# Patient Record
Sex: Male | Born: 1990
Health system: Southern US, Community
[De-identification: ages and names within clinical notes are randomized; demographics above are authoritative.]

## PROBLEM LIST (undated history)

## (undated) DIAGNOSIS — I10 Essential (primary) hypertension: Secondary | ICD-10-CM

## (undated) DIAGNOSIS — E1065 Type 1 diabetes mellitus with hyperglycemia: Secondary | ICD-10-CM

## (undated) DIAGNOSIS — E1010 Type 1 diabetes mellitus with ketoacidosis without coma: Secondary | ICD-10-CM

## (undated) DIAGNOSIS — J45909 Unspecified asthma, uncomplicated: Secondary | ICD-10-CM

## (undated) DIAGNOSIS — J189 Pneumonia, unspecified organism: Secondary | ICD-10-CM

## (undated) DIAGNOSIS — Z8489 Family history of other specified conditions: Secondary | ICD-10-CM

## (undated) DIAGNOSIS — J309 Allergic rhinitis, unspecified: Secondary | ICD-10-CM

## (undated) DIAGNOSIS — E1110 Type 2 diabetes mellitus with ketoacidosis without coma: Secondary | ICD-10-CM

## (undated) DIAGNOSIS — T8859XA Other complications of anesthesia, initial encounter: Secondary | ICD-10-CM

## (undated) DIAGNOSIS — D509 Iron deficiency anemia, unspecified: Secondary | ICD-10-CM

## (undated) DIAGNOSIS — Z9889 Other specified postprocedural states: Secondary | ICD-10-CM

## (undated) DIAGNOSIS — R519 Headache, unspecified: Secondary | ICD-10-CM

## (undated) DIAGNOSIS — N189 Chronic kidney disease, unspecified: Secondary | ICD-10-CM

## (undated) DIAGNOSIS — F988 Other specified behavioral and emotional disorders with onset usually occurring in childhood and adolescence: Secondary | ICD-10-CM

## (undated) DIAGNOSIS — D649 Anemia, unspecified: Secondary | ICD-10-CM

## (undated) DIAGNOSIS — E049 Nontoxic goiter, unspecified: Secondary | ICD-10-CM

## (undated) HISTORY — DX: Nontoxic goiter, unspecified: E04.9

## (undated) HISTORY — PX: TONSILLECTOMY: SUR1361

## (undated) HISTORY — DX: Allergic rhinitis, unspecified: J30.9

## (undated) HISTORY — DX: Other specified behavioral and emotional disorders with onset usually occurring in childhood and adolescence: F98.8

## (undated) HISTORY — DX: Type 1 diabetes mellitus with hyperglycemia: E10.65

---
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
## 2012-01-03 ENCOUNTER — Emergency Department (HOSPITAL_BASED_OUTPATIENT_CLINIC_OR_DEPARTMENT_OTHER)
Admission: EM | Admit: 2012-01-03 | Discharge: 2012-01-03 | Disposition: A | Discharge status: Home / Self Care | Payer: Self-pay | Attending: Emergency Medicine | Admitting: Emergency Medicine

## 2012-01-03 ENCOUNTER — Encounter (HOSPITAL_BASED_OUTPATIENT_CLINIC_OR_DEPARTMENT_OTHER): Payer: Self-pay | Admitting: *Deleted

## 2012-01-03 DIAGNOSIS — F17200 Nicotine dependence, unspecified, uncomplicated: Secondary | ICD-10-CM | POA: Insufficient documentation

## 2012-01-03 DIAGNOSIS — E109 Type 1 diabetes mellitus without complications: Secondary | ICD-10-CM | POA: Insufficient documentation

## 2012-01-03 DIAGNOSIS — R51 Headache: Secondary | ICD-10-CM | POA: Insufficient documentation

## 2012-01-03 DIAGNOSIS — I10 Essential (primary) hypertension: Secondary | ICD-10-CM | POA: Insufficient documentation

## 2012-01-03 DIAGNOSIS — J302 Other seasonal allergic rhinitis: Secondary | ICD-10-CM

## 2012-01-03 DIAGNOSIS — J309 Allergic rhinitis, unspecified: Secondary | ICD-10-CM | POA: Insufficient documentation

## 2012-01-03 DIAGNOSIS — R05 Cough: Secondary | ICD-10-CM

## 2012-01-03 DIAGNOSIS — F988 Other specified behavioral and emotional disorders with onset usually occurring in childhood and adolescence: Secondary | ICD-10-CM | POA: Insufficient documentation

## 2012-01-03 DIAGNOSIS — R059 Cough, unspecified: Secondary | ICD-10-CM | POA: Insufficient documentation

## 2012-01-03 MED ORDER — HYDROCOD POLST-CHLORPHEN POLST 10-8 MG/5ML PO LQCR: 5.0000 mL | Freq: Two times a day (BID) | ORAL | Status: DC | PRN

## 2012-01-03 MED ORDER — CETIRIZINE-PSEUDOEPHEDRINE ER 5-120 MG PO TB12: 1.0000 | ORAL_TABLET | Freq: Two times a day (BID) | ORAL | Status: DC

## 2012-01-03 NOTE — ED Provider Notes (Signed)
Medical screening examination/treatment/procedure(s) were performed by non-physician practitioner and as supervising physician I was immediately available for consultation/collaboration.   Karol Skarzynski, MD 01/03/12 1345 

## 2012-01-03 NOTE — ED Notes (Signed)
Productive cough almost 2 weeks. Clear sputum. Headache. Has been taking goodie powders for the head pain with no relief.

## 2012-01-03 NOTE — ED Provider Notes (Signed)
History     CSN: [id number]  Arrival date & time 01/03/12  1105   First MD Initiated Contact with Patient 01/03/12 1205      Chief Complaint  Patient presents with  . URI    (Consider location/radiation/quality/duration/timing/severity/associated sxs/prior treatment) Patient is a 21 y.o. male presenting with URI. The history is provided by the patient. No language interpreter was used.  URI The primary symptoms include headaches and cough. Primary symptoms do not include fever or nausea. The current episode started more than 1 week ago. This is a new problem. The problem has not changed since onset. Symptoms associated with the illness include congestion. The illness is not associated with sinus pressure.    Past Medical History  Diagnosis Date  . 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 Essential hypertension, benign 02/04/2011  . Goiter, unspecified 02/04/2011  . ADD (attention deficit disorder)     Past Surgical History  Procedure Date  . Tonsillectomy     Family History  Problem Relation Age of Onset  . Cancer Neg Hx     History  Substance Use Topics  . Smoking status: Current Everyday Smoker -- 1.0 packs/day for 1 years    Types: Cigarettes  . Smokeless tobacco: Not on file  . Alcohol Use: Yes      Review of Systems  Constitutional: Negative for fever.  HENT: Positive for congestion. Negative for sinus pressure.   Eyes: Negative.   Respiratory: Positive for cough.   Gastrointestinal: Negative for nausea.  Neurological: Positive for headaches.    Allergies  Review of patient's allergies indicates no known allergies.  Home Medications   Current Outpatient Rx  Name Route Sig Dispense Refill  . AMPHETAMINE-DEXTROAMPHET ER 20 MG PO CP24 Oral Take 20 mg by mouth every morning.      Marland Kitchen ACCU-CHEK AVIVA PLUS W/DEVICE KIT Does not apply 1 Device by Does not apply route once. 1 kit 0  . GLUCOSE BLOOD  VI STRP Other 1 each by Other route 4 (four) times daily. 4/day, and lancets 250.03  Variable glucoses 250.01 120 each 5  . INSULIN ASPART PROT & ASPART (70-30) 100 UNIT/ML [place] SUSP  45 units with breakfast, and 25 units with evening meal, and pen needles 2/day    . LISINOPRIL 5 MG PO TABS Oral Take 5 mg by mouth daily.        BP 114/72  Pulse 108  Temp(Src) 97.5 F (36.4 C) (Oral)  Resp 16  Ht 5\' 7"  (1.702 m)  Wt 169 lb (76.658 kg)  BMI 26.47 kg/m2  SpO2 99%  Physical Exam  Nursing note and vitals reviewed. Constitutional: He appears well-developed and well-nourished.  HENT:  Head: Normocephalic and atraumatic.  Right Ear: External ear normal.  Left Ear: External ear normal.  Nose: Rhinorrhea present.  Mouth/Throat: Posterior oropharyngeal erythema present.  Eyes: Conjunctivae and EOM are normal.  Neck: Normal range of motion. Neck supple.  Cardiovascular: Normal rate and regular rhythm.   Pulmonary/Chest: Effort normal and breath sounds normal.  Musculoskeletal: Normal range of motion.    ED Course  Procedures (including critical care time)  Labs Reviewed - No data to display No results found.   1. Cough   2. Seasonal allergic rhinitis       MDM  Don't think pt needs antibiotics at this time:will treat symptomatically        Teressa Lower, NP 01/03/12 1221

## 2012-01-03 NOTE — Discharge Instructions (Signed)
Allergic Rhinitis  Allergic rhinitis is when the mucous membranes in the nose respond to allergens. Allergens are particles in the air that cause your body to have an allergic reaction. This causes you to release allergic antibodies. Through a chain of events, these eventually cause you to release histamine into the blood stream (hence the use of antihistamines). Although meant to be protective to the body, it is this release that causes your discomfort, such as frequent sneezing, congestion and an itchy runny nose.    CAUSES    The pollen allergens may come from grasses, trees, and weeds. This is seasonal allergic rhinitis, or "hay fever." Other allergens cause year-round allergic rhinitis (perennial allergic rhinitis) such as house dust mite allergen, pet dander and mold spores.    SYMPTOMS     Nasal stuffiness (congestion).    Runny, itchy nose with sneezing and tearing of the eyes.    There is often an itching of the mouth, eyes and ears.   It cannot be cured, but it can be controlled with medications.  DIAGNOSIS    If you are unable to determine the offending allergen, skin or blood testing may find it.  TREATMENT     Avoid the allergen.    Medications and allergy shots (immunotherapy) can help.    Hay fever may often be treated with antihistamines in pill or nasal spray forms. Antihistamines block the effects of histamine. There are over-the-counter medicines that may help with nasal congestion and swelling around the eyes. Check with your caregiver before taking or giving this medicine.   If the treatment above does not work, there are many new medications your caregiver can prescribe. Stronger medications may be used if initial measures are ineffective. Desensitizing injections can be used if medications and avoidance fails. Desensitization is when a patient is given ongoing shots until the body becomes less sensitive to the allergen. Make sure you follow up with your caregiver if problems continue.   SEEK MEDICAL CARE IF:     You develop fever (more than 100.5 F (38.1 C).    You develop a cough that does not stop easily (persistent).    You have shortness of breath.    You start wheezing.    Symptoms interfere with normal daily activities.   Document Released: 05/03/2001 Document Revised: 07/28/2011 Document Reviewed: 11/12/2008  Exit[employer] Patient Information 2012 [employer]    Cough, Adult   A cough is a reflex that helps clear your throat and airways. It can help heal the body or may be a reaction to an irritated airway. A cough may only last 2 or 3 weeks (acute) or may last more than 8 weeks (chronic).    CAUSES  Acute cough:   Viral or bacterial infections.   Chronic cough:   Infections.    Allergies.    Asthma.    Post-nasal drip.    Smoking.    Heartburn or acid reflux.    Some medicines.    Chronic lung problems (COPD).    Cancer.   SYMPTOMS    Cough.    Fever.    Chest pain.    Increased breathing rate.    High-pitched whistling sound when breathing (wheezing).    Colored mucus that you cough up (sputum).   TREATMENT     A bacterial cough may be treated with antibiotic medicine.    A viral cough must run its course and will not respond to antibiotics.    Your caregiver may   recommend other treatments if you have a chronic cough.   HOME CARE INSTRUCTIONS     Only take over-the-counter or prescription medicines for pain, discomfort, or fever as directed by your caregiver. Use cough suppressants only as directed by your caregiver.    Use a cold steam vaporizer or humidifier in your bedroom or home to help loosen secretions.    Sleep in a semi-upright position if your cough is worse at night.    Rest as needed.    Stop smoking if you smoke.   SEEK IMMEDIATE MEDICAL CARE IF:     You have pus in your sputum.    Your cough starts to worsen.    You cannot control your cough with suppressants and are losing sleep.    You begin coughing up blood.     You have difficulty breathing.    You develop pain which is getting worse or is uncontrolled with medicine.    You have a fever.   MAKE SURE YOU:     Understand these instructions.    Will watch your condition.    Will get help right away if you are not doing well or get worse.   Document Released: 02/04/2011 Document Revised: 07/28/2011 Document Reviewed: 02/04/2011  [employer] Patient Information 2012 [employer]

## 2012-01-25 ENCOUNTER — Emergency Department (HOSPITAL_BASED_OUTPATIENT_CLINIC_OR_DEPARTMENT_OTHER)
Admission: EM | Admit: 2012-01-25 | Discharge: 2012-01-25 | Disposition: A | Discharge status: Home / Self Care | Payer: Self-pay | Attending: Emergency Medicine | Admitting: Emergency Medicine

## 2012-01-25 ENCOUNTER — Encounter (HOSPITAL_BASED_OUTPATIENT_CLINIC_OR_DEPARTMENT_OTHER): Payer: Self-pay | Admitting: Emergency Medicine

## 2012-01-25 DIAGNOSIS — E109 Type 1 diabetes mellitus without complications: Secondary | ICD-10-CM | POA: Insufficient documentation

## 2012-01-25 DIAGNOSIS — IMO0002 Reserved for concepts with insufficient information to code with codable children: Secondary | ICD-10-CM | POA: Insufficient documentation

## 2012-01-25 DIAGNOSIS — W57XXXA Bitten or stung by nonvenomous insect and other nonvenomous arthropods, initial encounter: Secondary | ICD-10-CM

## 2012-01-25 DIAGNOSIS — F17200 Nicotine dependence, unspecified, uncomplicated: Secondary | ICD-10-CM | POA: Insufficient documentation

## 2012-01-25 DIAGNOSIS — F988 Other specified behavioral and emotional disorders with onset usually occurring in childhood and adolescence: Secondary | ICD-10-CM | POA: Insufficient documentation

## 2012-01-25 MED ORDER — DOXYCYCLINE HYCLATE 100 MG PO CAPS: 100.0000 mg | ORAL_CAPSULE | Freq: Two times a day (BID) | ORAL | Status: AC

## 2012-01-25 NOTE — ED Provider Notes (Signed)
History    21 year old male presenting for evaluation tick bites. Takes removed from left foot and buttock area on Sunday. Presenting today he says mild redness around one of his foot when it checked out. No other complaints. No fevers or chills. No chest pain or shortness of breath. No dizziness or lightheadedness. Confusion. No numbness or tingling.  CSN: [id number]  Arrival date & time 01/25/12  2320   First MD Initiated Contact with Patient 01/25/12 2328      Chief Complaint  Patient presents with  . Insect Bite    (Consider location/radiation/quality/duration/timing/severity/associated sxs/prior treatment) HPI  Past Medical History  Diagnosis Date  . 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 Essential hypertension, benign 02/04/2011  . Goiter, unspecified 02/04/2011  . ADD (attention deficit disorder)     Past Surgical History  Procedure Date  . Tonsillectomy     Family History  Problem Relation Age of Onset  . Cancer Neg Hx     History  Substance Use Topics  . Smoking status: Current Everyday Smoker -- 1.0 packs/day for 1 years    Types: Cigarettes  . Smokeless tobacco: Not on file  . Alcohol Use: Yes      Review of Systems   Review of symptoms negative unless otherwise noted in HPI.    Allergies  Review of patient's allergies indicates no known allergies.  Home Medications   Current Outpatient Rx  Name Route Sig Dispense Refill  . AMPHETAMINE-DEXTROAMPHET ER 20 MG PO CP24 Oral Take 20 mg by mouth every morning.      Marland Kitchen ACCU-CHEK AVIVA PLUS W/DEVICE KIT Does not apply 1 Device by Does not apply route once. 1 kit 0  . CETIRIZINE-PSEUDOEPHEDRINE ER 5-120 MG PO TB12 Oral Take 1 tablet by mouth 2 (two) times daily. 20 tablet 0  . HYDROCOD POLST-CPM POLST ER 10-8 MG/5ML PO LQCR Oral Take 5 mLs by mouth every 12 (twelve) hours as needed. 140 mL 0  . DOXYCYCLINE HYCLATE 100 MG PO CAPS Oral Take 1  capsule (100 mg total) by mouth 2 (two) times daily. 28 capsule 0  . GLUCOSE BLOOD VI STRP Other 1 each by Other route 4 (four) times daily. 4/day, and lancets 250.03  Variable glucoses 250.01 120 each 5  . INSULIN ASPART PROT & ASPART (70-30) 100 UNIT/ML [place] SUSP  45 units with breakfast, and 25 units with evening meal, and pen needles 2/day    . LISINOPRIL 5 MG PO TABS Oral Take 5 mg by mouth daily.        BP 118/74  Pulse 98  Temp(Src) 97.8 F (36.6 C) (Oral)  Resp 18  SpO2 100%  Physical Exam  Nursing note and vitals reviewed. Constitutional: He appears well-developed and well-nourished. No distress.  HENT:  Head: Normocephalic and atraumatic.  Eyes: Conjunctivae are normal. Right eye exhibits no discharge. Left eye exhibits no discharge.  Neck: Neck supple.  Cardiovascular: Normal rate, regular rhythm and normal heart sounds.  Exam reveals no gallop and no friction rub.   No murmur heard. Pulmonary/Chest: Effort normal and breath sounds normal. No respiratory distress.  Abdominal: Soft. He exhibits no distension. There is no tenderness.  Musculoskeletal: He exhibits no edema and no tenderness.  Neurological: He is alert.  Skin: Skin is warm and dry.       Small lesion to dorsum of distal L foot near base of 2nd/3rd digits. Nospecific but consistent with provided history. Minimal surrounding erythema  but not in pattern of erythema migrans. Small lesions buttock with similar appearance. Non tender. No fluctuance. No drainage or induration.  Psychiatric: He has a normal mood and affect. His behavior is normal. Thought content normal.    ED Course  Procedures (including critical care time)  Labs Reviewed - No data to display No results found.   1. Tick bite       MDM  21 year old male with small lesions consistent with insect bites. There is faint erythema surrounding this but they do not appear to be secondarily infected. Patient with no other complaints. Will give course  of doxycycline empirically. Return precautions were discussed. Outpatient followup.        Raeford Razor, MD 01/25/12 [phone]

## 2012-01-25 NOTE — ED Notes (Signed)
Pt states he removed a tick from buttock and left foot. Pt wants wounds checked.

## 2012-01-25 NOTE — Discharge Instructions (Signed)
Wood Tick Bite Ticks are insects that attach themselves to the skin. Most tick bites are harmless, but sometimes ticks carry diseases that can make a person quite ill. The chance of getting ill depends on:  The kind of tick that bites you.   Time of year.   How long the tick is attached.   Geographic location.  Wood ticks are also called dog ticks. They are generally black. They can have white markings. They live in shrubs and grassy areas. They are larger than deer ticks. Wood ticks are about the size of a watermelon seed. They have a hard body. The most common places for ticks to attach themselves are the scalp, neck, armpits, waist, and groin. Wood ticks may stay attached for up to 2 weeks. TICKS MUST BE REMOVED AS SOON AS POSSIBLE TO HELP PREVENT DISEASES CAUSED BY TICK BITES.  TO REMOVE A TICK: 1. If available, put on latex gloves before trying to remove a tick.  2. Grasp the tick as close to the skin as possible, with curved forceps, fine tweezers or a special tick removal tool.  3. Pull gently with steady pressure until the tick lets go. Do not twist the tick or jerk it suddenly. This may break off the tick's head or mouth parts.  4. Do not crush the tick's body. This could force disease-carrying fluids from the tick into your body.  5. After the tick is removed, wash the bite area and your hands with soap and water or other disinfectant.  6. Apply a small amount of antiseptic cream or ointment to the bite site.  7. Wash and disinfect any instruments that were used.  8. Save the tick in a jar or plastic bag for later identification. Preserve the tick with a bit of alcohol or put it in the freezer.  9. Do not apply a hot match, petroleum jelly, or fingernail polish to the tick. This does not work and may increase the chances of disease from the tick bite.  YOU MAY NEED TO SEE YOUR CAREGIVER FOR A TETANUS SHOT NOW IF:  You have no idea when you had the last one.   You have never had a  tetanus shot before.  If you need a tetanus shot, and you decide not to get one, there is a rare chance of getting tetanus. Sickness from tetanus can be serious. If you get a tetanus shot, your arm may swell, get red and warm to the touch at the shot site. This is common and not a problem. PREVENTION  Wear protective clothing. Long sleeves and pants are best.   Wear white clothes to see ticks more easily   Tuck your pant legs into your socks.   If walking on trail, stay in the middle of the trail to avoid brushing against bushes.   Put insect repellent on all exposed skin and along boot tops, pant legs and sleeve cuffs   Check clothing, hair and skin repeatedly and before coming inside.   Brush off any ticks that are not attached.  SEEK MEDICAL CARE IF:   You cannot remove a tick or part of the tick that is left in the skin.   Unexplained fever.   Redness and swelling in the area of the tick bite.   Tender, swollen lymph glands.   Diarrhea.   Weight loss.   Cough.   Fatigue.   Muscle, joint or bone pain.   Belly pain.   Headache.   Rash.    SEEK IMMEDIATE MEDICAL CARE IF:   You develop an oral temperature above 102 F (38.9 C).   You are having trouble walking or moving your legs.   Numbness in the legs.   Shortness of breath.   Confusion.   Repeated vomiting.  Document Released: 08/05/2000 Document Revised: 07/28/2011 Document Reviewed: 07/14/2008 [employer] Patient Information 2012 [employer]. 

## 2012-02-07 ENCOUNTER — Encounter (HOSPITAL_BASED_OUTPATIENT_CLINIC_OR_DEPARTMENT_OTHER): Payer: Self-pay | Admitting: *Deleted

## 2012-02-07 ENCOUNTER — Emergency Department (HOSPITAL_BASED_OUTPATIENT_CLINIC_OR_DEPARTMENT_OTHER)
Admission: EM | Admit: 2012-02-07 | Discharge: 2012-02-07 | Disposition: A | Discharge status: Home / Self Care | Payer: Self-pay | Attending: Emergency Medicine | Admitting: Emergency Medicine

## 2012-02-07 ENCOUNTER — Telehealth: Payer: Self-pay | Admitting: Endocrinology

## 2012-02-07 DIAGNOSIS — F988 Other specified behavioral and emotional disorders with onset usually occurring in childhood and adolescence: Secondary | ICD-10-CM | POA: Insufficient documentation

## 2012-02-07 DIAGNOSIS — R21 Rash and other nonspecific skin eruption: Secondary | ICD-10-CM | POA: Insufficient documentation

## 2012-02-07 DIAGNOSIS — E109 Type 1 diabetes mellitus without complications: Secondary | ICD-10-CM | POA: Insufficient documentation

## 2012-02-07 DIAGNOSIS — L237 Allergic contact dermatitis due to plants, except food: Secondary | ICD-10-CM

## 2012-02-07 DIAGNOSIS — E049 Nontoxic goiter, unspecified: Secondary | ICD-10-CM | POA: Insufficient documentation

## 2012-02-07 DIAGNOSIS — F17200 Nicotine dependence, unspecified, uncomplicated: Secondary | ICD-10-CM | POA: Insufficient documentation

## 2012-02-07 DIAGNOSIS — Z794 Long term (current) use of insulin: Secondary | ICD-10-CM | POA: Insufficient documentation

## 2012-02-07 DIAGNOSIS — I10 Essential (primary) hypertension: Secondary | ICD-10-CM | POA: Insufficient documentation

## 2012-02-07 MED ORDER — TRIAMCINOLONE ACETONIDE 0.5 % EX OINT: TOPICAL_OINTMENT | Freq: Three times a day (TID) | CUTANEOUS | Status: DC

## 2012-02-07 NOTE — ED Provider Notes (Signed)
History     CSN: [id number]  Arrival date & time 02/07/12  1215   First MD Initiated Contact with Patient 02/07/12 1223      Chief Complaint  Patient presents with  . Rash    (Consider location/radiation/quality/duration/timing/severity/associated sxs/prior treatment) Patient is a 21 y.o. male presenting with rash. The history is provided by the patient. No language interpreter was used.  Rash  This is a new problem. The current episode started 12 to 24 hours ago. The problem has been gradually worsening. There has been no fever. The rash is present on the right hand. Associated symptoms include blisters and itching. He has tried nothing for the symptoms.  Pt complains of poison ivy on right hand.  Pt is diabetic  Past Medical History  Diagnosis Date  . 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 Essential hypertension, benign 02/04/2011  . Goiter, unspecified 02/04/2011  . ADD (attention deficit disorder)     Past Surgical History  Procedure Date  . Tonsillectomy     Family History  Problem Relation Age of Onset  . Cancer Neg Hx     History  Substance Use Topics  . Smoking status: Current Everyday Smoker -- 1.0 packs/day for 1 years    Types: Cigarettes  . Smokeless tobacco: Not on file  . Alcohol Use: Yes      Review of Systems  Skin: Positive for itching and rash.  All other systems reviewed and are negative.    Allergies  Review of patient's allergies indicates no known allergies.  Home Medications   Current Outpatient Rx  Name Route Sig Dispense Refill  . AMPHETAMINE-DEXTROAMPHET ER 20 MG PO CP24 Oral Take 20 mg by mouth every morning.      Marland Kitchen ACCU-CHEK AVIVA PLUS W/DEVICE KIT Does not apply 1 Device by Does not apply route once. 1 kit 0  . CETIRIZINE-PSEUDOEPHEDRINE ER 5-120 MG PO TB12 Oral Take 1 tablet by mouth 2 (two) times daily. 20 tablet 0  . HYDROCOD POLST-CPM POLST ER 10-8 MG/5ML PO LQCR  Oral Take 5 mLs by mouth every 12 (twelve) hours as needed. 140 mL 0  . GLUCOSE BLOOD VI STRP Other 1 each by Other route 4 (four) times daily. 4/day, and lancets 250.03  Variable glucoses 250.01 120 each 5  . INSULIN ASPART PROT & ASPART (70-30) 100 UNIT/ML [place] SUSP  45 units with breakfast, and 25 units with evening meal, and pen needles 2/day    . LISINOPRIL 5 MG PO TABS Oral Take 5 mg by mouth daily.        BP 107/77  Pulse 95  Temp 97.6 F (36.4 C) (Oral)  Resp 18  SpO2 100%  Physical Exam  Vitals reviewed. Constitutional: He is oriented to person, place, and time. He appears well-developed and well-nourished.  Musculoskeletal: He exhibits tenderness.       Rash right hand,  Multiple small pimples  Neurological: He is alert and oriented to person, place, and time. He has normal reflexes.  Skin: Skin is warm and dry. Rash noted.  Psychiatric: He has a normal mood and affect.    ED Course  Procedures (including critical care time)  Labs Reviewed - No data to display No results found.   No diagnosis found.    MDM  Triamcinalone cream        Lonia Skinner [place], [place] 02/07/12 1241

## 2012-02-07 NOTE — Discharge Instructions (Signed)
Poison Ivy Poison ivy is a inflammation of the skin (contact dermatitis) caused by touching the allergens on the leaves of the ivy plant following previous exposure to the plant. The rash usually appears 48 hours after exposure. The rash is usually bumps (papules) or blisters (vesicles) in a linear pattern. Depending on your own sensitivity, the rash may simply cause redness and itching, or it may also progress to blisters which may break open. These must be well cared for to prevent secondary bacterial (germ) infection, followed by scarring. Keep any open areas dry, clean, dressed, and covered with an antibacterial ointment if needed. The eyes may also get puffy. The puffiness is worst in the morning and gets better as the day progresses. This dermatitis usually heals without scarring, within 2 to 3 weeks without treatment. HOME CARE INSTRUCTIONS  Thoroughly wash with soap and water as soon as you have been exposed to poison ivy. You have about one half hour to remove the plant resin before it will cause the rash. This washing will destroy the oil or antigen on the skin that is causing, or will cause, the rash. Be sure to wash under your fingernails as any plant resin there will continue to spread the rash. Do not rub skin vigorously when washing affected area. Poison ivy cannot spread if no oil from the plant remains on your body. A rash that has progressed to weeping sores will not spread the rash unless you have not washed thoroughly. It is also important to wash any clothes you have been wearing as these may carry active allergens. The rash will return if you wear the unwashed clothing, even several days later. Avoidance of the plant in the future is the best measure. Poison ivy plant can be recognized by the number of leaves. Generally, poison ivy has three leaves with flowering branches on a single stem. Diphenhydramine may be purchased over the counter and used as needed for itching. Do not drive with  this medication if it makes you drowsy.Ask your caregiver about medication for children. SEEK MEDICAL CARE IF:  Open sores develop.   Redness spreads beyond area of rash.   You notice purulent (pus-like) discharge.   You have increased pain.   Other signs of infection develop (such as fever).  Document Released: 08/05/2000 Document Revised: 07/28/2011 Document Reviewed: 06/24/2009 [employer] Patient Information 2012 [employer]. 

## 2012-02-07 NOTE — Telephone Encounter (Signed)
Caller: Drury/Patient; PCP: Romero Belling; Call regarding Needs Copy of Sliding Scale for Novalog and Lantis- He Can Come by Office Today and Pick It Up. PLEASE CALL WHEN READY;CB#: [phone];

## 2012-02-07 NOTE — ED Notes (Signed)
Pt amb to room 8 with quick steady gait in nad. 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 Denies any pain or fevers.

## 2012-02-07 NOTE — ED Provider Notes (Signed)
Medical screening examination/treatment/procedure(s) were performed by non-physician practitioner and as supervising physician I was immediately available for consultation/collaboration.    Nelia Shi, MD 02/07/12 [phone]

## 2012-02-08 NOTE — Telephone Encounter (Signed)
please call patient: F/u ov is overdue.  Please make appt.  We'll address then

## 2012-02-08 NOTE — Telephone Encounter (Signed)
Pt has appointment for 02/13/2012 9:45am. AVS and office note from last OV printed and placed upfront in cabinet ready for pt to pickup. Pt informed.

## 2012-02-13 ENCOUNTER — Encounter: Payer: Self-pay | Admitting: Endocrinology

## 2012-02-13 ENCOUNTER — Ambulatory Visit (INDEPENDENT_AMBULATORY_CARE_PROVIDER_SITE_OTHER): Payer: Self-pay | Admitting: Endocrinology

## 2012-02-13 VITALS — BP 112/56 | HR 89 | Temp 98.0°F | Ht 70.0 in | Wt 163.0 lb

## 2012-02-13 DIAGNOSIS — E1065 Type 1 diabetes mellitus with hyperglycemia: Secondary | ICD-10-CM

## 2012-02-13 NOTE — Progress Notes (Signed)
  Subjective:    Patient ID: John [name], male    DOB: 06-20-1991, 21 y.o.   MRN: [id number]  HPI Pt returns for f/u of type 1 dm (dx'ed 2008; no known complications).  He says he has never had an episode of severe hypoglycemia.  He changed the novolog 70/30 back to lantus, due to a rash.  The rash is now resolved.  He is here with his girlfriend, who says pt eats excessively throughout the day.  no cbg record, but states cbg's we well-controlled when he was on the novolog 70/30.   Past Medical History  Diagnosis Date  . 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 Essential hypertension, benign 02/04/2011  . Goiter, unspecified 02/04/2011  . ADD (attention deficit disorder)     Past Surgical History  Procedure Date  . Tonsillectomy     History   Social History  . Marital Status: Single    Spouse Name: N/A    Number of Children: N/A  . Years of Education: N/A   Occupational History  . Not on file.   Social History Main Topics  . Smoking status: Current Everyday Smoker -- 1.0 packs/day for 1 years    Types: Cigarettes  . Smokeless tobacco: Not on file  . Alcohol Use: Yes  . Drug Use: Not on file  . Sexually Active: Not on file   Other Topics Concern  . Not on file   Social History Narrative   Regular exercise-yes    Current Outpatient Prescriptions on File Prior to Visit  Medication Sig Dispense Refill  . amphetamine-dextroamphetamine (ADDERALL XR) 20 MG 24 hr capsule Take 20 mg by mouth every morning.        . cetirizine-pseudoephedrine (ZYRTEC-D) 5-120 MG per tablet Take 1 tablet by mouth 2 (two) times daily.  20 tablet  0  . insulin lispro protamine-insulin lispro (HUMALOG 75/25) (75-25) 100 UNIT/ML SUSP 45 units with breakfast, and 25 units with evening meal.      . lisinopril (PRINIVIL,ZESTRIL) 5 MG tablet Take 5 mg by mouth daily.        Marland Kitchen triamcinolone ointment (KENALOG) 0.5 % Apply topically 3 (three)  times daily.  30 g  0    No Known Allergies  Family History  Problem Relation Age of Onset  . Cancer Neg Hx     BP 112/56  Pulse 89  Temp 98 F (36.7 C) (Oral)  Ht 5\' 10"  (1.778 m)  Wt 163 lb (73.936 kg)  BMI 23.39 kg/m2  SpO2 97%  Review of Systems denies hypoglycemia    Objective:   Physical Exam VITAL SIGNS:  See vs page GENERAL: no distress Pulses: dorsalis pedis intact bilat.   Feet: no deformity.  no ulcer on the feet.  feet are of normal color and temp.  no edema Neuro: sensation is intact to touch on the feet Skin: no rash      Assessment & Plan:  DM, poor control.  Rash, ? Due to insulin.

## 2012-02-13 NOTE — Patient Instructions (Addendum)
check your blood sugar 4 times a day.  vary the time of day when you check, between before the 3 meals, and at bedtime.  also check if you have symptoms of your blood sugar being too high or too low.  please keep a record of the readings and bring it to your next appointment here.  please call us sooner if you are having low blood sugar episodes, or if it stays over 200.   The next step is to write down your blood sugar, and any comments you would like to make in the book also.    Take humalog 75/25, 45 units with breakfast, and 25 units with evening meal. Please come back for a follow-up appointment for 3 months.

## 2012-02-14 ENCOUNTER — Telehealth: Payer: Self-pay | Admitting: Endocrinology

## 2012-02-14 NOTE — Telephone Encounter (Signed)
For now, go back to the lantus you were on.  Please call if you need a prescription

## 2012-02-14 NOTE — Telephone Encounter (Signed)
Informed pt's GF of MD's advisement regarding insulin  On VM and to callback office with any questions/concerns.

## 2012-02-14 NOTE — Telephone Encounter (Signed)
John [name] was seen yesterday.  His insulin was changed.  He is breaking out in a rash.  He took humalog 35 units last night.  He started breaking out in 15 min.  All over chest and abdomen.

## 2012-02-15 ENCOUNTER — Telehealth: Payer: Self-pay

## 2012-02-15 NOTE — Telephone Encounter (Signed)
Pt called requesting out of work note from Monday to today, returning tomorrow. Zella Ball @ [phone]

## 2012-02-15 NOTE — Telephone Encounter (Signed)
i printed 

## 2012-02-15 NOTE — Telephone Encounter (Signed)
Work note faxed to number listed below, pt informed.

## 2012-05-14 ENCOUNTER — Ambulatory Visit: Payer: Self-pay | Admitting: Endocrinology

## 2012-05-14 DIAGNOSIS — Z0289 Encounter for other administrative examinations: Secondary | ICD-10-CM

## 2012-05-15 ENCOUNTER — Encounter: Payer: Self-pay | Admitting: *Deleted

## 2012-05-15 ENCOUNTER — Ambulatory Visit (INDEPENDENT_AMBULATORY_CARE_PROVIDER_SITE_OTHER): Payer: Self-pay | Admitting: Endocrinology

## 2012-05-15 ENCOUNTER — Encounter: Payer: Self-pay | Admitting: Endocrinology

## 2012-05-15 ENCOUNTER — Other Ambulatory Visit (INDEPENDENT_AMBULATORY_CARE_PROVIDER_SITE_OTHER): Payer: Self-pay

## 2012-05-15 VITALS — BP 118/64 | HR 78 | Temp 98.4°F | Wt 166.0 lb

## 2012-05-15 DIAGNOSIS — E1065 Type 1 diabetes mellitus with hyperglycemia: Secondary | ICD-10-CM

## 2012-05-15 DIAGNOSIS — IMO0002 Reserved for concepts with insufficient information to code with codable children: Secondary | ICD-10-CM

## 2012-05-15 LAB — HEMOGLOBIN A1C: Hgb A1c MFr Bld: 10.8 % — ABNORMAL HIGH (ref 4.6–6.5)

## 2012-05-15 NOTE — Progress Notes (Signed)
  Subjective:    Patient ID: John [name], male    DOB: 22-Oct-1990, 21 y.o.   MRN: [id number]  HPI Pt returns for f/u of type 1 dm (dx'ed 2008; no known complications; he has struggled with compliance issues; he says he has never had an episode of severe hypoglycemia).   He says his medicaid is pending.  no cbg record, but states cbg's are well-controlled.  He has mild hypoglycemia if he misses a meal.   Past Medical History  Diagnosis Date  . 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 Essential hypertension, benign 02/04/2011  . Goiter, unspecified 02/04/2011  . ADD (attention deficit disorder)     Past Surgical History  Procedure Date  . Tonsillectomy     History   Social History  . Marital Status: Single    Spouse Name: N/A    Number of Children: N/A  . Years of Education: N/A   Occupational History  . Not on file.   Social History Main Topics  . Smoking status: Current Every Day Smoker -- 1.0 packs/day for 1 years    Types: Cigarettes  . Smokeless tobacco: Not on file  . Alcohol Use: Yes  . Drug Use: Not on file  . Sexually Active: Not on file   Other Topics Concern  . Not on file   Social History Narrative   Regular exercise-yes    Current Outpatient Prescriptions on File Prior to Visit  Medication Sig Dispense Refill  . amphetamine-dextroamphetamine (ADDERALL XR) 20 MG 24 hr capsule Take 20 mg by mouth every morning.        . cetirizine-pseudoephedrine (ZYRTEC-D) 5-120 MG per tablet Take 1 tablet by mouth 2 (two) times daily.  20 tablet  0  . insulin lispro protamine-insulin lispro (HUMALOG 75/25) (75-25) 100 UNIT/ML SUSP 45 units with breakfast, and 25 units with evening meal.      . triamcinolone ointment (KENALOG) 0.5 % Apply topically 3 (three) times daily.  30 g  0  . lisinopril (PRINIVIL,ZESTRIL) 5 MG tablet Take 5 mg by mouth daily.          No Known Allergies  Family History  Problem  Relation Age of Onset  . Cancer Neg Hx     BP 118/64  Pulse 78  Temp 98.4 F (36.9 C) (Oral)  Wt 166 lb (75.297 kg)  Review of Systems Denies LOC    Objective:   Physical Exam VITAL SIGNS:  See vs page GENERAL: no distress SKIN:  Insulin injection sites at the anterior abdomen are normal   Lab Results  Component Value Date   HGBA1C 10.8* 05/15/2012      Assessment & Plan:  DM, therapy limited by noncompliance.  i'll do the best i can.

## 2012-05-15 NOTE — Patient Instructions (Addendum)
check your blood sugar 4 times a day.  vary the time of day when you check, between before the 3 meals, and at bedtime.  also check if you have symptoms of your blood sugar being too high or too low.  please keep a record of the readings and bring it to your next appointment here.  please call us sooner if you are having low blood sugar episodes, or if it stays over 200.  Here is a book to write it in.   Please write comments you would like to make in the book, also.    continue humalog 75/25, 45 units with breakfast, and 25 units with evening meal.   Please come back for a follow-up appointment for 3-4 months.   On this type of insulin, it is important to eat meals on a regular schedule.  please call [phone] ([place] physician referral line), to get an appointment with a new primary doctor.

## 2012-05-17 ENCOUNTER — Telehealth: Payer: Self-pay

## 2012-05-17 NOTE — Telephone Encounter (Signed)
You are not disabled from the standpoint of the diabetes. If there is a another reason, please ask your pcp

## 2012-05-17 NOTE — Telephone Encounter (Signed)
Pt called requesting work excuse for yesterday and today, returning tomorrow. Please advise

## 2012-05-18 NOTE — Telephone Encounter (Signed)
Pt informed

## 2012-08-27 ENCOUNTER — Inpatient Hospital Stay (HOSPITAL_COMMUNITY)
Admission: EM | Admit: 2012-08-27 | Discharge: 2012-08-30 | DRG: 639 | Disposition: A | Discharge status: Home / Self Care | Payer: Medicaid Other | Attending: Internal Medicine | Admitting: Internal Medicine

## 2012-08-27 DIAGNOSIS — E1110 Type 2 diabetes mellitus with ketoacidosis without coma: Secondary | ICD-10-CM | POA: Diagnosis present

## 2012-08-27 DIAGNOSIS — IMO0002 Reserved for concepts with insufficient information to code with codable children: Secondary | ICD-10-CM | POA: Diagnosis present

## 2012-08-27 DIAGNOSIS — E049 Nontoxic goiter, unspecified: Secondary | ICD-10-CM

## 2012-08-27 DIAGNOSIS — E1010 Type 1 diabetes mellitus with ketoacidosis without coma: Principal | ICD-10-CM | POA: Diagnosis present

## 2012-08-27 DIAGNOSIS — I10 Essential (primary) hypertension: Secondary | ICD-10-CM

## 2012-08-27 DIAGNOSIS — E1065 Type 1 diabetes mellitus with hyperglycemia: Secondary | ICD-10-CM | POA: Diagnosis present

## 2012-08-27 DIAGNOSIS — Z7289 Other problems related to lifestyle: Secondary | ICD-10-CM | POA: Diagnosis present

## 2012-08-27 DIAGNOSIS — E875 Hyperkalemia: Secondary | ICD-10-CM | POA: Diagnosis present

## 2012-08-27 DIAGNOSIS — F1090 Alcohol use, unspecified, uncomplicated: Secondary | ICD-10-CM | POA: Diagnosis present

## 2012-08-27 DIAGNOSIS — E860 Dehydration: Secondary | ICD-10-CM | POA: Diagnosis present

## 2012-08-27 DIAGNOSIS — E109 Type 1 diabetes mellitus without complications: Secondary | ICD-10-CM | POA: Diagnosis present

## 2012-08-27 DIAGNOSIS — A088 Other specified intestinal infections: Secondary | ICD-10-CM | POA: Diagnosis present

## 2012-08-27 DIAGNOSIS — I498 Other specified cardiac arrhythmias: Secondary | ICD-10-CM | POA: Diagnosis present

## 2012-08-27 DIAGNOSIS — F17200 Nicotine dependence, unspecified, uncomplicated: Secondary | ICD-10-CM | POA: Diagnosis present

## 2012-08-27 DIAGNOSIS — F988 Other specified behavioral and emotional disorders with onset usually occurring in childhood and adolescence: Secondary | ICD-10-CM | POA: Diagnosis present

## 2012-08-27 DIAGNOSIS — Z794 Long term (current) use of insulin: Secondary | ICD-10-CM

## 2012-08-27 DIAGNOSIS — D72829 Elevated white blood cell count, unspecified: Secondary | ICD-10-CM | POA: Diagnosis present

## 2012-08-27 LAB — GLUCOSE, CAPILLARY: Glucose-Capillary: >600 mg/dL (ref 70–99)

## 2012-08-27 MED ORDER — SODIUM CHLORIDE 0.9 % IV SOLN
Freq: Once | INTRAVENOUS | Status: AC
  Administered 2012-08-27: via INTRAVENOUS

## 2012-08-27 MED ORDER — ONDANSETRON HCL 4 MG/2ML IJ SOLN
4.0000 mg | Freq: Once | INTRAMUSCULAR | Status: AC
  Administered 2012-08-27: 4 mg via INTRAVENOUS
  Filled 2012-08-27: qty 2

## 2012-08-27 MED ORDER — FENTANYL CITRATE 0.05 MG/ML IJ SOLN
50.0000 ug | Freq: Once | INTRAMUSCULAR | Status: AC
  Administered 2012-08-27: 50 ug via INTRAVENOUS
  Filled 2012-08-27: qty 2

## 2012-08-27 NOTE — ED Notes (Signed)
Brought in by EMS from home with c/o abdominal pain with nausea, vomiting.  Per EMS, pt is also diabetic--- pt's CBG was 400, taken by EMS.

## 2012-08-27 NOTE — ED Notes (Signed)
[id number]<AV> Expected date:<BR> Expected time:<BR> Means of arrival:<BR> Comments:<BR> EMS/21 yo diabetic-nausea and vomiting-hyperglycemia with &gt;400 blood sugar

## 2012-08-28 ENCOUNTER — Inpatient Hospital Stay (HOSPITAL_COMMUNITY): Payer: Medicaid Other

## 2012-08-28 ENCOUNTER — Encounter (HOSPITAL_COMMUNITY): Payer: Self-pay | Admitting: Emergency Medicine

## 2012-08-28 DIAGNOSIS — D72829 Elevated white blood cell count, unspecified: Secondary | ICD-10-CM | POA: Diagnosis present

## 2012-08-28 DIAGNOSIS — E860 Dehydration: Secondary | ICD-10-CM | POA: Diagnosis present

## 2012-08-28 DIAGNOSIS — E1110 Type 2 diabetes mellitus with ketoacidosis without coma: Secondary | ICD-10-CM | POA: Diagnosis present

## 2012-08-28 LAB — BASIC METABOLIC PANEL
BUN: 22 mg/dL (ref 6–23)
BUN: 20 mg/dL (ref 6–23)
BUN: 18 mg/dL (ref 6–23)
BUN: 17 mg/dL (ref 6–23)
BUN: 16 mg/dL (ref 6–23)
BUN: 18 mg/dL (ref 6–23)
BUN: 18 mg/dL (ref 6–23)
CO2: <7 mEq/L — CL (ref 19–32)
CO2: 7 mEq/L — CL (ref 19–32)
CO2: 18 mEq/L — ABNORMAL LOW (ref 19–32)
Calcium: 8 mg/dL — ABNORMAL LOW (ref 8.4–10.5)
Calcium: 8.2 mg/dL — ABNORMAL LOW (ref 8.4–10.5)
Calcium: 8.3 mg/dL — ABNORMAL LOW (ref 8.4–10.5)
Calcium: 8.3 mg/dL — ABNORMAL LOW (ref 8.4–10.5)
Calcium: 8.5 mg/dL (ref 8.4–10.5)
Calcium: 8.8 mg/dL (ref 8.4–10.5)
Calcium: 8.5 mg/dL (ref 8.4–10.5)
Chloride: 95 mEq/L — ABNORMAL LOW (ref 96–112)
Chloride: 102 mEq/L (ref 96–112)
Chloride: 106 mEq/L (ref 96–112)
Creatinine, Ser: 0.81 mg/dL (ref 0.50–1.35)
Creatinine, Ser: 0.82 mg/dL (ref 0.50–1.35)
Creatinine, Ser: 0.79 mg/dL (ref 0.50–1.35)
Creatinine, Ser: 0.69 mg/dL (ref 0.50–1.35)
Creatinine, Ser: 0.65 mg/dL (ref 0.50–1.35)
Creatinine, Ser: 0.6 mg/dL (ref 0.50–1.35)
Creatinine, Ser: 0.75 mg/dL (ref 0.50–1.35)
Creatinine, Ser: 0.7 mg/dL (ref 0.50–1.35)
GFR calc Af Amer: >90 mL/min (ref >90)
GFR calc Af Amer: >90 mL/min (ref >90)
GFR calc Af Amer: >90 mL/min (ref >90)
GFR calc Af Amer: >90 mL/min (ref >90)
GFR calc non Af Amer: >90 mL/min (ref >90)
GFR calc non Af Amer: >90 mL/min (ref >90)
GFR calc non Af Amer: >90 mL/min (ref >90)
GFR calc non Af Amer: >90 mL/min (ref >90)
Glucose, Bld: 531 mg/dL — ABNORMAL HIGH (ref 70–99)
Glucose, Bld: 457 mg/dL — ABNORMAL HIGH (ref 70–99)
Glucose, Bld: 342 mg/dL — ABNORMAL HIGH (ref 70–99)
Glucose, Bld: 214 mg/dL — ABNORMAL HIGH (ref 70–99)
Glucose, Bld: 141 mg/dL — ABNORMAL HIGH (ref 70–99)
Potassium: 5.1 mEq/L (ref 3.5–5.1)
Sodium: 130 mEq/L — ABNORMAL LOW (ref 135–145)
Sodium: 135 mEq/L (ref 135–145)

## 2012-08-28 LAB — CBC
HCT: 44.9 % (ref 39.0–52.0)
MCHC: 35.9 g/dL (ref 30.0–36.0)
MCV: 82.8 fL (ref 78.0–100.0)
MCV: 79.8 fL (ref 78.0–100.0)
Platelets: 448 10*3/uL — ABNORMAL HIGH (ref 150–400)
Platelets: 413 10*3/uL — ABNORMAL HIGH (ref 150–400)
RDW: 12.4 % (ref 11.5–15.5)
RDW: 12.5 % (ref 11.5–15.5)
WBC: 38.6 10*3/uL — ABNORMAL HIGH (ref 4.0–10.5)
WBC: 27.9 10*3/uL — ABNORMAL HIGH (ref 4.0–10.5)

## 2012-08-28 LAB — GLUCOSE, CAPILLARY
Glucose-Capillary: 498 mg/dL — ABNORMAL HIGH (ref 70–99)
Glucose-Capillary: 228 mg/dL — ABNORMAL HIGH (ref 70–99)
Glucose-Capillary: 223 mg/dL — ABNORMAL HIGH (ref 70–99)
Glucose-Capillary: 163 mg/dL — ABNORMAL HIGH (ref 70–99)
Glucose-Capillary: 151 mg/dL — ABNORMAL HIGH (ref 70–99)
Glucose-Capillary: 203 mg/dL — ABNORMAL HIGH (ref 70–99)
Glucose-Capillary: 259 mg/dL — ABNORMAL HIGH (ref 70–99)
Glucose-Capillary: 162 mg/dL — ABNORMAL HIGH (ref 70–99)

## 2012-08-28 LAB — CBC WITH DIFFERENTIAL/PLATELET
Basophils Absolute: 0.5 10*3/uL — ABNORMAL HIGH (ref 0.0–0.1)
Lymphocytes Relative: 11 % — ABNORMAL LOW (ref 12–46)
Lymphs Abs: 5.1 10*3/uL — ABNORMAL HIGH (ref 0.7–4.0)
Monocytes Relative: 6 % (ref 3–12)
Neutrophils Relative %: 82 % — ABNORMAL HIGH (ref 43–77)
Platelets: 699 10*3/uL — ABNORMAL HIGH (ref 150–400)
RDW: 12.5 % (ref 11.5–15.5)
WBC: 46.2 10*3/uL — ABNORMAL HIGH (ref 4.0–10.5)

## 2012-08-28 LAB — COMPREHENSIVE METABOLIC PANEL
AST: 15 U/L (ref 0–37)
Albumin: 4.8 g/dL (ref 3.5–5.2)
Chloride: 87 mEq/L — ABNORMAL LOW (ref 96–112)
Creatinine, Ser: 0.96 mg/dL (ref 0.50–1.35)
Sodium: 130 mEq/L — ABNORMAL LOW (ref 135–145)
Total Bilirubin: 0.4 mg/dL (ref 0.3–1.2)

## 2012-08-28 LAB — BLOOD GAS, VENOUS
O2 Content: 2 L/min
O2 Saturation: 74.5 %
Patient temperature: 98.6

## 2012-08-28 LAB — MRSA PCR SCREENING: MRSA by PCR: NEGATIVE

## 2012-08-28 LAB — URINALYSIS, ROUTINE W REFLEX MICROSCOPIC
Glucose, UA: >1000 mg/dL — AB
Ketones, ur: >80 mg/dL — AB
Leukocytes, UA: NEGATIVE
pH: 5 (ref 5.0–8.0)

## 2012-08-28 LAB — URINE MICROSCOPIC-ADD ON

## 2012-08-28 MED ORDER — INSULIN ASPART 100 UNIT/ML ~~LOC~~ SOLN
0.0000 [IU] | Freq: Three times a day (TID) | SUBCUTANEOUS | Status: DC
  Administered 2012-08-29 (×2): 3 [IU] via SUBCUTANEOUS
  Administered 2012-08-29 – 2012-08-30 (×2): 5 [IU] via SUBCUTANEOUS
  Administered 2012-08-30: 2 [IU] via SUBCUTANEOUS

## 2012-08-28 MED ORDER — INSULIN ASPART PROT & ASPART (70-30 MIX) 100 UNIT/ML ~~LOC~~ SUSP
45.0000 [IU] | Freq: Every day | SUBCUTANEOUS | Status: DC
  Administered 2012-08-29 – 2012-08-30 (×2): 45 [IU] via SUBCUTANEOUS

## 2012-08-28 MED ORDER — DEXTROSE-NACL 5-0.45 % IV SOLN: INTRAVENOUS | Status: DC

## 2012-08-28 MED ORDER — SODIUM CHLORIDE 0.9 % IV SOLN
INTRAVENOUS | Status: DC
  Administered 2012-08-28: 5.4 [IU]/h via INTRAVENOUS
  Filled 2012-08-28: qty 1

## 2012-08-28 MED ORDER — HEPARIN SODIUM (PORCINE) 5000 UNIT/ML IJ SOLN
5000.0000 [IU] | Freq: Three times a day (TID) | INTRAMUSCULAR | Status: DC
  Administered 2012-08-28 – 2012-08-30 (×7): 5000 [IU] via SUBCUTANEOUS
  Filled 2012-08-28 (×10): qty 1

## 2012-08-28 MED ORDER — SODIUM CHLORIDE 0.9 % IV SOLN
Freq: Once | INTRAVENOUS | Status: AC
  Administered 2012-08-28: 01:00:00 via INTRAVENOUS

## 2012-08-28 MED ORDER — DEXTROSE 50 % IV SOLN: 25.0000 mL | INTRAVENOUS | Status: DC | PRN

## 2012-08-28 MED ORDER — SODIUM CHLORIDE 0.9 % IV SOLN: INTRAVENOUS | Status: DC

## 2012-08-28 MED ORDER — SODIUM CHLORIDE 0.9 % IV SOLN
INTRAVENOUS | Status: DC
  Administered 2012-08-28: 11.4 [IU]/h via INTRAVENOUS
  Administered 2012-08-28: 14:00:00 via INTRAVENOUS
  Filled 2012-08-28 (×2): qty 1

## 2012-08-28 MED ORDER — FENTANYL CITRATE 0.05 MG/ML IJ SOLN: 50.0000 ug | Freq: Once | INTRAMUSCULAR | Status: DC

## 2012-08-28 MED ORDER — SODIUM CHLORIDE 0.9 % IV SOLN
INTRAVENOUS | Status: DC
  Filled 2012-08-28: qty 1

## 2012-08-28 MED ORDER — ONDANSETRON HCL 4 MG/2ML IJ SOLN: 4.0000 mg | Freq: Four times a day (QID) | INTRAMUSCULAR | Status: DC | PRN

## 2012-08-28 MED ORDER — INSULIN REGULAR BOLUS VIA INFUSION
5.0000 [IU] | Freq: Three times a day (TID) | INTRAVENOUS | Status: DC
  Administered 2012-08-29 (×3): 5 [IU] via INTRAVENOUS
  Filled 2012-08-28: qty 5

## 2012-08-28 MED ORDER — FENTANYL CITRATE 0.05 MG/ML IJ SOLN
50.0000 ug | Freq: Once | INTRAMUSCULAR | Status: AC
  Administered 2012-08-28: 50 ug via INTRAVENOUS
  Filled 2012-08-28: qty 2

## 2012-08-28 MED ORDER — POTASSIUM CHLORIDE CRYS ER 20 MEQ PO TBCR
40.0000 meq | EXTENDED_RELEASE_TABLET | Freq: Once | ORAL | Status: AC
  Administered 2012-08-28: 40 meq via ORAL
  Filled 2012-08-28: qty 2

## 2012-08-28 MED ORDER — INSULIN ASPART PROT & ASPART (70-30 MIX) 100 UNIT/ML ~~LOC~~ SUSP
25.0000 [IU] | Freq: Every day | SUBCUTANEOUS | Status: DC
  Administered 2012-08-28 – 2012-08-29 (×2): 25 [IU] via SUBCUTANEOUS
  Filled 2012-08-28 (×2): qty 10

## 2012-08-28 MED ORDER — HEPARIN SODIUM (PORCINE) 5000 UNIT/ML IJ SOLN: 5000.0000 [IU] | Freq: Three times a day (TID) | INTRAMUSCULAR | Status: DC

## 2012-08-28 MED ORDER — LACTATED RINGERS IV BOLUS (SEPSIS)
1000.0000 mL | Freq: Once | INTRAVENOUS | Status: DC
  Administered 2012-08-28: 1000 mL via INTRAVENOUS

## 2012-08-28 MED ORDER — DEXTROSE-NACL 5-0.45 % IV SOLN
INTRAVENOUS | Status: DC
  Administered 2012-08-28 (×2): via INTRAVENOUS

## 2012-08-28 MED ORDER — SODIUM CHLORIDE 0.9 % IV SOLN
INTRAVENOUS | Status: DC
  Administered 2012-08-28 – 2012-08-29 (×2): via INTRAVENOUS
  Administered 2012-08-29: 1000 mL via INTRAVENOUS
  Administered 2012-08-30: 11:00:00 via INTRAVENOUS

## 2012-08-28 MED ORDER — INSULIN ASPART 100 UNIT/ML ~~LOC~~ SOLN
0.0000 [IU] | Freq: Every day | SUBCUTANEOUS | Status: DC
  Administered 2012-08-29: 3 [IU] via SUBCUTANEOUS

## 2012-08-28 MED ORDER — SODIUM CHLORIDE 0.9 % IV BOLUS (SEPSIS)
500.0000 mL | Freq: Once | INTRAVENOUS | Status: AC
  Administered 2012-08-28: 500 mL via INTRAVENOUS

## 2012-08-28 MED ORDER — INSULIN ASPART PROT & ASPART (70-30 MIX) 100 UNIT/ML ~~LOC~~ SUSP: 45.0000 [IU] | Freq: Every day | SUBCUTANEOUS | Status: DC

## 2012-08-28 MED ORDER — SODIUM CHLORIDE 0.9 % IV SOLN: INTRAVENOUS | Status: AC

## 2012-08-28 MED ORDER — OXYCODONE-ACETAMINOPHEN 5-325 MG PO TABS
1.0000 | ORAL_TABLET | ORAL | Status: DC | PRN
  Administered 2012-08-28 – 2012-08-30 (×6): 1 via ORAL
  Filled 2012-08-28 (×6): qty 1

## 2012-08-28 MED ORDER — SODIUM CHLORIDE 0.9 % IV BOLUS (SEPSIS)
2000.0000 mL | Freq: Once | INTRAVENOUS | Status: AC
  Administered 2012-08-28: 2000 mL via INTRAVENOUS

## 2012-08-28 MED ORDER — INSULIN ASPART PROT & ASPART (70-30 MIX) 100 UNIT/ML ~~LOC~~ SUSP
25.0000 [IU] | Freq: Every day | SUBCUTANEOUS | Status: DC
  Filled 2012-08-28: qty 3

## 2012-08-28 NOTE — Progress Notes (Signed)
[id number]/Rhonda Davis, RN, BSN, CCM: CHART REVIEWED AND UPDATED.  Next chart review due on 01102014. NO DISCHARGE NEEDS PRESENT AT THIS TIME. CASE MANAGEMENT [phone] 

## 2012-08-28 NOTE — Clinical Social Work Psychosocial (Signed)
Clinical Social Work Department BRIEF PSYCHOSOCIAL ASSESSMENT 08/28/2012  Patient:  DVAUGHN, FICKLE     Account Number:  [id number]     Admit date:  08/27/2012  Clinical Social Worker:  Jodelle Red  Date/Time:  08/28/2012 10:15 AM  Referred by:  RN  Date Referred:  08/28/2012 Referred for  Other - See comment   Other Referral:   financial concerns, medicaid stopped   Interview type:  Family Other interview type:   discussion with mother, Pt not alert.    PSYCHOSOCIAL DATA Living Status:  FAMILY Admitted from facility:   Level of care:   Primary support name:  Lavonne Cass Primary support relationship to patient:  PARENT Degree of support available:   good from mother and girlfriend.    CURRENT CONCERNS Current Concerns  Financial Resources   Other Concerns:    SOCIAL WORK ASSESSMENT / PLAN CSW met with mother at RN request as she had concerns for her son's inability to afford meds. CSW provided mother with medicaid information and made referral to financial counselor. Pt lives at mother's home with his girlfriend and 72 month old son. Mother provides food and room for Pt. She appears very concerned and fearful of son's condition.   Assessment/plan status:  Referral to [facility] Other assessment/ plan:   csw made referral to financial counselor and RN CM for medication assistance.   Information/referral to community resources:    PATIENT'S/FAMILY'S RESPONSE TO PLAN OF CARE: Pt's mother very appreciative. CSW explained several programs Pt and his family could qualify for. CSW will reassess needs in the am.    Doreen Salvage, LCSW ICU/Stepdown Clinical Social Worker [facility] Cell [phone] Hours 8am-1200pm M-F

## 2012-08-28 NOTE — Progress Notes (Signed)
Nutrition Brief Note  Patient identified on the Malnutrition Screening Tool (MST) Report  Body mass index is 21.08 kg/(m^2). Patient meets criteria for wnl based on current BMI.   Current diet order is NPO except for sugar-free beverages. Labs and medications reviewed.   Pt with known hx of DM type 1 admitted for DKA after acute gastroenteritis.  Pt sleeping at time of visit.  Fiance at bedside declines needs to her knowledge and endorse good control as reported to her by patient.  RD will follow to ensure no needs with pt.  No nutrition interventions warranted at this time. If nutrition issues arise, please consult RD.   Loyce Dys, MS RD LDN Clinical Inpatient Dietitian Pager: [phone] Weekend/After hours pager: [phone]

## 2012-08-28 NOTE — ED Notes (Signed)
Admitting DR and X-ray at bedside.

## 2012-08-28 NOTE — ED Provider Notes (Signed)
History     CSN: [id number]  Arrival date & time 08/27/12  2332   First MD Initiated Contact with Patient 08/28/12 0029      Chief Complaint  Patient presents with  . Abdominal Pain, Nausea and Vomiting     (Consider location/radiation/quality/duration/timing/severity/associated sxs/prior treatment) HPI This is a 22 year old male with type 1 diabetes. His doctor adjusted his insulin regimen several months ago and he has been poorly controlled since. He is here with nausea, vomiting and abdominal pain that began this morning. It has persisted throughout the day. He did not want to come to the hospital but his mother forced him. On arrival he was noted to be tachycardic, tachypneic with CBG greater than 600. A 2 L normal saline bolus was initiated by protocol and he was given fentanyl and Zofran as well with improvement in his nausea and abdominal pain. He has had polydipsia and polyuria all day. His oral mucous membranes are dry. He is lethargic. The symptoms are severe.  Past Medical History  Diagnosis Date  . 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 Essential hypertension, benign 02/04/2011  . Goiter, unspecified 02/04/2011  . ADD (attention deficit disorder)     Past Surgical History  Procedure Date  . Tonsillectomy     Family History  Problem Relation Age of Onset  . Cancer Neg Hx     History  Substance Use Topics  . Smoking status: Current Every Day Smoker -- 1.0 packs/day for 1 years    Types: Cigarettes  . Smokeless tobacco: Not on file  . Alcohol Use: Yes      Review of Systems  All other systems reviewed and are negative.    Allergies  Review of patient's allergies indicates no known allergies.  Home Medications   Current Outpatient Rx  Name  Route  Sig  Dispense  Refill  . INSULIN ASPART 100 UNIT/ML [place] SOLN   Subcutaneous   Inject 6-10 Units into the skin 3 (three) times daily before meals.        . INSULIN GLARGINE 100 UNIT/ML [place] SOLN   Subcutaneous   Inject 10-19 Units into the skin at bedtime.         . INSULIN LISPRO PROT & LISPRO (75-25) 100 UNIT/ML [place] SUSP   Subcutaneous   Inject 25-45 Units into the skin 2 (two) times daily with a meal. 45 units in am and 25 units in pm         . INSULIN LISPRO PROT & LISPRO (75-25) 100 UNIT/ML [place] SUSP      45 units with breakfast, and 25 units with evening meal.           BP 149/68  Pulse 139  Temp 97.3 F (36.3 C)  Resp 16  Ht 6\' 1"  (1.854 m)  Wt 170 lb (77.111 kg)  BMI 22.43 kg/m2  SpO2 100%  Physical Exam General: Well-developed, thin male in no acute distress; appearance consistent with age of record; sunken eye HENT: normocephalic, atraumatic; dry mucous membranes; breath smells of ketones Eyes: pupils equal round and reactive to light; extraocular muscles intact Neck: supple Heart: regular rate and rhythm; tachycardic Lungs: clear to auscultation bilaterally; tachypnea Chest: Bilateral lower rib tenderness Abdomen: soft; nondistended; upper abdominal wall tenderness; no masses or hepatosplenomegaly; bowel sounds present Extremities: No deformity; full range of motion; pulses normal Neurologic: Awake but lethargic; motor function intact in all extremities and symmetric; no facial droop Skin:  Warm and dry Psychiatric: Flat affect   ED Course  Procedures (including critical care time)  CRITICAL CARE Performed by: Lynette Topete L   Total critical care time: 30 minutes  Critical care time was exclusive of separately billable procedures and treating other patients.  Critical care was necessary to treat or prevent imminent or life-threatening deterioration.  Critical care was time spent personally by me on the following activities: development of treatment plan with patient and/or surrogate as well as nursing, discussions with consultants, evaluation of patient's response to treatment, examination of  patient, obtaining history from patient or surrogate, ordering and performing treatments and interventions, ordering and review of laboratory studies, ordering and review of radiographic studies, pulse oximetry and re-evaluation of patient's condition.   MDM   Nursing notes and vitals signs, including pulse oximetry, reviewed.  Summary of this visit's results, reviewed by myself:  Labs:  Results for orders placed during the hospital encounter of 08/27/12 (from the past 24 hour(s))  GLUCOSE, CAPILLARY     Status: Abnormal   Collection Time   08/27/12 11:49 PM      Component Value Range   Glucose-Capillary >600 (*) 70 - 99 mg/dL   Comment 1 Notify RN    CBC WITH DIFFERENTIAL     Status: Abnormal   Collection Time   08/27/12 11:55 PM      Component Value Range   WBC 46.2 (*) 4.0 - 10.5 K/uL   RBC 5.73  4.22 - 5.81 MIL/uL   Hemoglobin 16.7  13.0 - 17.0 g/dL   HCT 09.8  11.9 - 14.7 %   MCV 84.3  78.0 - 100.0 fL   MCH 29.1  26.0 - 34.0 pg   MCHC 34.6  30.0 - 36.0 g/dL   RDW 82.9  56.2 - 13.0 %   Platelets 699 (*) 150 - 400 K/uL   Neutrophils Relative 82 (*) 43 - 77 %   Lymphocytes Relative 11 (*) 12 - 46 %   Monocytes Relative 6  3 - 12 %   Eosinophils Relative 0  0 - 5 %   Basophils Relative 1  0 - 1 %   Neutro Abs 37.8 (*) 1.7 - 7.7 K/uL   Lymphs Abs 5.1 (*) 0.7 - 4.0 K/uL   Monocytes Absolute 2.8 (*) 0.1 - 1.0 K/uL   Eosinophils Absolute 0.0  0.0 - 0.7 K/uL   Basophils Absolute 0.5 (*) 0.0 - 0.1 K/uL   WBC Morphology TOXIC GRANULATION    COMPREHENSIVE METABOLIC PANEL     Status: Abnormal   Collection Time   08/27/12 11:55 PM      Component Value Range   Sodium 130 (*) 135 - 145 mEq/L   Potassium 5.3 (*) 3.5 - 5.1 mEq/L   Chloride 87 (*) 96 - 112 mEq/L   CO2 <7 (*) 19 - 32 mEq/L   Glucose, Bld 729 (*) 70 - 99 mg/dL   BUN 24 (*) 6 - 23 mg/dL   Creatinine, Ser 8.65  0.50 - 1.35 mg/dL   Calcium 8.8  8.4 - 78.4 mg/dL   Total Protein 7.4  6.0 - 8.3 g/dL   Albumin 4.8  3.5 - 5.2  g/dL   AST 15  0 - 37 U/L   ALT 13  0 - 53 U/L   Alkaline Phosphatase 108  39 - 117 U/L   Total Bilirubin 0.4  0.3 - 1.2 mg/dL   GFR calc non Af Amer >90  >90 mL/min   GFR  calc Af Amer >90  >90 mL/min  LIPASE, BLOOD     Status: Normal   Collection Time   08/27/12 11:55 PM      Component Value Range   Lipase 17  11 - 59 U/L  BLOOD GAS, VENOUS     Status: Abnormal   Collection Time   08/28/12 12:30 AM      Component Value Range   O2 Content 2.0     Delivery systems NASAL CANNULA     pH, Ven 6.942 (*) 7.250 - 7.300   pCO2, Ven 22.1 (*) 45.0 - 50.0 mmHg   pO2, Ven 45.9 (*) 30.0 - 45.0 mmHg   Bicarbonate 4.5 (*) 20.0 - 24.0 mEq/L   TCO2 4.6  0 - 100 mmol/L   Acid-base deficit 29.3 (*) 0.0 - 2.0 mmol/L   O2 Saturation 74.5     Patient temperature 98.6     Collection site VEIN     Drawn by COLLECTED BY NURSE     Sample type VEIN      EKG Interpretation:  Date & Time: 08/28/2012 12:24 AM  Rate: 142  Rhythm: sinus tachycardia  QRS Axis: normal  Intervals: QT prolonged  ST/T Wave abnormalities: normal  Conduction Disutrbances:none  Narrative Interpretation:   Old EKG Reviewed: none available  12:49 AM Insulin drip per Glucose Stabilizer ordered. Exam and laboratories consistent with diabetic ketoacidosis.   1:09 AM PCCM consulted.         Hanley Seamen, MD 08/28/12 [id number]

## 2012-08-28 NOTE — Progress Notes (Signed)
[facility] PROGRESS NOTE  JAYSE HODKINSON NWG:[id number] DOB: 1991-01-29 DOA: 08/27/2012 PCP: Harlow Asa, MD  Brief narrative: John [name] is an 22 y.o. male with multiple medical problems including type 1 diabetes with prior DKA, last admission for DKA many years ago, presents to the emergency room with abdominal pain, nausea and vomiting and found to be in DKA with pH of 6.9 and bicarbonate of 4. Trigger may have been acute viral gastroenteritis as he had nausea and vomiting prior to admission with several family members sick with similar symptoms.  Assessment/Plan: Principal Problem:  *DKA (diabetic ketoacidoses) / type 1 diabetes  Admitted to the ICU and placed on a glucose stabilizer and an insulin drip. Bicarbonate increased to 12 this morning.  Trigger appears to be viral gastroenteritis given normal chest x-ray and urinalysis negative for signs of infection. No flulike symptoms.  Check hemoglobin A1c. Consult diabetes coordinator. Active Problems:  Dehydration  Secondary to osmotic diuresis. Aggressively hydrating.  Leukocytosis  Thought to be from severe volume depletion and dehydration. No obvious signs of infection. White blood cell count dropping with rehydration.  Alcohol intake above recommended sensible limits  Smoker  Counseled on cessation.  Code Status: Full code. Family Communication: Mother updated at bedside. Disposition Plan: Home when stable.   Medical Consultants:  None  Other Consultants:  Diabetes coordinator  Anti-infectives:  None  HPI/Subjective: Mr. Ellerby is lethargic and sleeping. His family says he slept through the night.  Objective: Filed Vitals:   08/28/12 0500 08/28/12 0600 08/28/12 0700 08/28/12 0800  BP: 122/69 126/71 121/61 126/63  Pulse: 127 128 120 117  Temp:      Resp: 18 20 16 17   Height:      Weight:      SpO2: 100% 100% 100% 100%    Intake/Output Summary (Last 24 hours) at 08/28/12 0846 Last data filed  at 08/28/12 0800  Gross per 24 hour  Intake 605.96 ml  Output   2900 ml  Net -2294.04 ml    Exam: Gen:  NAD, lethargic Cardiovascular:  Tachycardic Respiratory:  Lungs CTAB Gastrointestinal:  Abdomen soft, NT/ND, + BS Extremities:  No C/E/C  Data Reviewed: Basic Metabolic Panel:  Lab 08/28/12 5784 08/28/12 0515 08/28/12 0315 08/28/12 0206 08/27/12 2355  NA 132* 132* 130* 129* 130*  K PENDING 5.1 -- -- --  CL 102 101 95* 95* 87*  CO2 12* 7* 7* <7* <7*  GLUCOSE 231* 342* 457* 531* 729*  BUN 17 18 20 22  24*  CREATININE 0.69 0.79 0.82 0.81 0.96  CALCIUM 8.3* 8.3* 8.2* 8.0* 8.8  MG -- -- -- -- --  PHOS -- -- -- -- --   GFR Estimated Creatinine Clearance: 148.1 ml/min (by C-G formula based on Cr of 0.69). Liver Function Tests:  Lab 08/27/12 2355  AST 15  ALT 13  ALKPHOS 108  BILITOT 0.4  PROT 7.4  ALBUMIN 4.8    Lab 08/27/12 2355  LIPASE 17  AMYLASE --   CBC:  Lab 08/28/12 0206 08/27/12 2355  WBC 38.6* 46.2*  NEUTROABS -- 37.8*  HGB 16.1 16.7  HCT 44.9 48.3  MCV 82.8 84.3  PLT 448* 699*   CBG:  Lab 08/28/12 0208 08/27/12 2349  GLUCAP 498* >600*   Microbiology Recent Results (from the past 240 hour(s))  MRSA PCR SCREENING     Status: Normal   Collection Time   08/28/12  2:24 AM      Component Value Range Status Comment  MRSA by PCR NEGATIVE  NEGATIVE Final      Procedures and Diagnostic Studies: Dg Chest Port 1 View  08/28/2012  *RADIOLOGY REPORT*  Clinical Data: Nausea and vomiting.  Flu symptoms.  PORTABLE CHEST - 1 VIEW  Comparison: PA and lateral chest 10/16/2010.  Findings: Lungs clear.  Heart size normal.  No pneumothorax or pleural fluid.  No focal bony abnormality.  IMPRESSION: Negative chest.   Original Report Authenticated By: Holley Dexter, M.D.     Scheduled Meds:   . heparin  5,000 Units Subcutaneous Q8H   Continuous Infusions:   . sodium chloride 150 mL/hr at 08/28/12 0343  . dextrose 5 % and 0.45% NaCl 75 mL/hr at 08/28/12  0755  . insulin (NOVOLIN-R) infusion 6.7 Units/hr (08/28/12 0733)    Time spent: 35 minutes.   LOS: 1 day   Julee Stoll  [facility] Pager [phone].  If 8PM-8AM, please contact night-coverage at [email or web address], password [facility] 08/28/2012, 8:46 AM

## 2012-08-28 NOTE — Progress Notes (Signed)
Inpatient Diabetes Program Recommendations  AACE/ADA: New Consensus Statement on Inpatient Glycemic Control (2013)  Target Ranges:  Prepandial:   less than 140 mg/dL      Peak postprandial:   less than 180 mg/dL (1-2 hours)      Critically ill patients:  140 - 180 mg/dL   Reason for Visit: DKA  John [name] is an 22 y.o. male with multiple medical problems including type 1 diabetes with prior DKA, last admission for DKA many years ago, presents to the emergency room with abdominal pain, nausea and vomiting and found to be in DKA with pH of 6.9 and bicarbonate of 4. Trigger may have been acute viral gastroenteritis as he had nausea and vomiting prior to admission with several family members sick with similar symptoms. Sees Dr. Everardo All for diabetes.  Ran out of insulin samples that he got from Dr. Everardo All. States he could not afford 75/25 as it was over $300.00/mo.    Results for John [name], John [name] (MRN [id number]) as of 08/28/2012 16:16  Ref. Range 08/28/2012 13:28  Sodium Latest Range: 135-145 mEq/L 134 (L)  Potassium Latest Range: 3.5-5.1 mEq/L 3.5  Chloride Latest Range: 96-112 mEq/L 106  CO2 Latest Range: 19-32 mEq/L 18 (L)  BUN Latest Range: 6-23 mg/dL 16  Creatinine Latest Range: 0.50-1.35 mg/dL 4.09  Calcium Latest Range: 8.4-10.5 mg/dL 8.5  GFR calc non Af Amer Latest Range: >90 mL/min >90  GFR calc Af Amer Latest Range: >90 mL/min >90  Glucose Latest Range: 70-99 mg/dL 811 (H)  Results for John [name], John [name] (MRN [id number]) as of 08/28/2012 16:16  Ref. Range 08/28/2012 05:15  Hemoglobin A1C Latest Range: <5.7 % 12.4 (H)   Anion gap is closed.   When pt ready for transition to SQ, recommend:  70/30 45 units in am and 25 units in pm.   Novolog sensitive tidwc and hs (recommend prescription for Regular insulin for home)  Give 70/30 insulin 1 - 2 hours prior to discontinuation of GlucoStabilizer and simultaneously give Novolog correction.  Will continue to follow.  Thank you. Ailene Ards, RD, LDN, CDE Inpatient Diabetes Coordinator [phone]

## 2012-08-28 NOTE — H&P (Signed)
[facility] History and Physical  John [name]:[id number] DOB: 05-06-1991    PCP:   Harlow Asa, MD   Chief Complaint: abdominal cramps, nausea and vomiting.  HPI: John [name] is an 22 y.o. male  with multiple medical problems including type 1 diabetes with prior DKA,  last admission for DKA many years ago, presents to the emergency room with abdominal pain, nausea and vomiting and found to be in DKA with pH of 6.9 and bicarbonate of 4. Further evaluation included negative UA for any evidence of infection, clear CXR , blood glucose greater than 700, creatinine of 0.96   and potassium of  5.5 . Patient is lethargic but easily arousable and oriented and able to give a good history. There has been no fever, chills, cough, dysuria. Hospitalist was asked to admit her for DKA.  He denied any missed insulin, food indiscretion, and denied any flu like symptoms.  He did have severe polyuria, polydipsia, and symptoms only started 2 days ago.   Rewiew of Systems:  Constitutional: Negative for malaise, fever and chills. No significant weight loss or weight gain Eyes: Negative for eye pain, redness and discharge, diplopia, visual changes, or flashes of light. ENMT: Negative for ear pain, hoarseness, nasal congestion, sinus pressure and sore throat. No headaches; tinnitus, drooling, or problem swallowing. Cardiovascular: Negative for chest pain, palpitations, diaphoresis, dyspnea and peripheral edema. ; No orthopnea, PND Respiratory: Negative for cough, hemoptysis, wheezing and stridor. No pleuritic chestpain. Gastrointestinal: Negative for diarrhea, constipation,  melena, blood in stool, hematemesis, jaundice and rectal bleeding.    Genitourinary: Negative for frequency, dysuria, incontinence,flank pain and hematuria; Musculoskeletal: Negative for back pain and neck pain. Negative for swelling and trauma.;  Skin: . Negative for pruritus, rash, abrasions, bruising and skin lesion.;  ulcerations Neuro: Negative for headache, lightheadedness and neck stiffness. Negative for weakness, altered level of consciousness , altered mental status, extremity weakness, burning feet, involuntary movement, seizure and syncope.  Psych: negative for anxiety, depression, insomnia, tearfulness, panic attacks, hallucinations, paranoia, suicidal or homicidal ideation    Past Medical History  Diagnosis Date  . 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 Essential hypertension, benign 02/04/2011  . Goiter, unspecified 02/04/2011  . ADD (attention deficit disorder)     Past Surgical History  Procedure Date  . Tonsillectomy     Medications:  HOME MEDS: Prior to Admission medications   Medication Sig Start Date End Date Taking? Authorizing Provider  insulin aspart (NOVOLOG) 100 UNIT/ML injection Inject 6-10 Units into the skin 3 (three) times daily before meals.   Yes Historical Provider, MD  insulin glargine (LANTUS) 100 UNIT/ML injection Inject 10-19 Units into the skin at bedtime.   Yes Historical Provider, MD  insulin lispro protamine-insulin lispro (HUMALOG 75/25) (75-25) 100 UNIT/ML SUSP Inject 25-45 Units into the skin 2 (two) times daily with a meal. 45 units in am and 25 units in pm   Yes Historical Provider, MD  insulin lispro protamine-insulin lispro (HUMALOG 75/25) (75-25) 100 UNIT/ML SUSP 45 units with breakfast, and 25 units with evening meal.    Historical Provider, MD     Allergies:  No Known Allergies  Social History:   reports that he has been smoking Cigarettes.  He has a 1 pack-year smoking history. He does not have any smokeless tobacco history on file. He reports that he drinks alcohol. His drug history not on file.  Family History: Family History  Problem Relation Age  of Onset  . Cancer Neg Hx      Physical Exam: Filed Vitals:   08/27/12 2345 08/28/12 0035  BP: 149/68   Pulse: 139   Temp: 97.3 F  (36.3 C)   Resp: 16   Height:  6\' 1"  (1.854 m)  Weight:  77.111 kg (170 lb)  SpO2: 100%    Blood pressure 149/68, pulse 139, temperature 97.3 F (36.3 C), resp. rate 16, height 6\' 1"  (1.854 m), weight 77.111 kg (170 lb), SpO2 100.00%.  GEN:  Pleasant patient lying in the stretcher in no acute distress; cooperative with exam. PSYCH:  alert and oriented x4; does not appear anxious or depressed; affect is appropriate. HEENT: Mucous membranes pink and anicteric; PERRLA; EOM intact; no cervical lymphadenopathy nor thyromegaly or carotid bruit; no JVD; There were no stridor. Neck is very supple. Breasts:: Not examined CHEST WALL: No tenderness CHEST: Normal respiration, clear to auscultation bilaterally.  HEART: regular rhythm but tachycardic,.  There are no murmur, rub, or gallops.   BACK: No kyphosis or scoliosis; no CVA tenderness ABDOMEN: soft and non-tender; no masses, no organomegaly, normal abdominal bowel sounds; no pannus; no intertriginous candida. There is no rebound and no distention. Rectal Exam: Not done EXTREMITIES: No bone or joint deformity; age-appropriate arthropathy of the hands and knees; no edema; no ulcerations.  There is no calf tenderness. Genitalia: not examined PULSES: 2+ and symmetric SKIN: Normal hydration no rash or ulceration CNS: Cranial nerves 2-12 grossly intact no focal lateralizing neurologic deficit.  Speech is fluent; uvula elevated with phonation, facial symmetry and tongue midline. DTR are normal bilaterally, cerebella exam is intact, barbinski is negative and strengths are equaled bilaterally.  No sensory loss.   Labs on Admission:  Basic Metabolic Panel:  Lab 08/27/12 1610  NA 130*  K 5.3*  CL 87*  CO2 <7*  GLUCOSE 729*  BUN 24*  CREATININE 0.96  CALCIUM 8.8  MG --  PHOS --   Liver Function Tests:  Lab 08/27/12 2355  AST 15  ALT 13  ALKPHOS 108  BILITOT 0.4  PROT 7.4  ALBUMIN 4.8    Lab 08/27/12 2355  LIPASE 17  AMYLASE --    No results found for this basename: AMMONIA:5 in the last 168 hours CBC:  Lab 08/28/12 0206 08/27/12 2355  WBC 38.6* 46.2*  NEUTROABS -- 37.8*  HGB 16.1 16.7  HCT 44.9 48.3  MCV 82.8 84.3  PLT 448* 699*   Cardiac Enzymes: No results found for this basename: CKTOTAL:5,CKMB:5,CKMBINDEX:5,TROPONINI:5 in the last 168 hours  CBG:  Lab 08/28/12 0208 08/27/12 2349  GLUCAP 498* >600*     Radiological Exams on Admission: Dg Chest Port 1 View  08/28/2012  *RADIOLOGY REPORT*  Clinical Data: Nausea and vomiting.  Flu symptoms.  PORTABLE CHEST - 1 VIEW  Comparison: PA and lateral chest 10/16/2010.  Findings: Lungs clear.  Heart size normal.  No pneumothorax or pleural fluid.  No focal bony abnormality.  IMPRESSION: Negative chest.   Original Report Authenticated By: Holley Dexter, M.D.      Assessment/Plan Present on Admission:  . DKA (diabetic ketoacidoses) . Alcohol intake above recommended sensible limits . Smoker   PLAN:  Will admit patient to the ICU for DKA.  PCCM was consulted but deferred admission to the hospitalist team.  Will give IVF along with implementing the glucose stabalizer.   Will tx with analgesics and antiemetics. There is no evidence of infection and I suspect the leukocytosis is from severe volume depletion.  Home medications will be continued except the home insulin since patient is on insulin drip.  Patient is stable, full code, and will be admitted to [facility] service.  I encourage him to quit cigarettes.  Other plans as per orders.  Code Status: FULL Unk Lightning, MD. [facility] Pager [phone] 7pm to 7am.  08/28/2012, 2:40 AM

## 2012-08-28 NOTE — ED Notes (Signed)
Notified RN, Isaias Cowman elevated heart rate 139.

## 2012-08-28 NOTE — Progress Notes (Signed)
HYperkalemia   NS bolus ordered

## 2012-08-28 NOTE — ED Notes (Signed)
Notified RN, Isaias Cowman CBG 498.

## 2012-08-29 DIAGNOSIS — E860 Dehydration: Secondary | ICD-10-CM

## 2012-08-29 DIAGNOSIS — I10 Essential (primary) hypertension: Secondary | ICD-10-CM

## 2012-08-29 DIAGNOSIS — F1010 Alcohol abuse, uncomplicated: Secondary | ICD-10-CM

## 2012-08-29 LAB — GLUCOSE, CAPILLARY
Glucose-Capillary: 105 mg/dL — ABNORMAL HIGH (ref 70–99)
Glucose-Capillary: 86 mg/dL (ref 70–99)
Glucose-Capillary: 118 mg/dL — ABNORMAL HIGH (ref 70–99)
Glucose-Capillary: 227 mg/dL — ABNORMAL HIGH (ref 70–99)
Glucose-Capillary: 229 mg/dL — ABNORMAL HIGH (ref 70–99)

## 2012-08-29 LAB — CBC
Platelets: 286 10*3/uL (ref 150–400)
RBC: 4.98 MIL/uL (ref 4.22–5.81)
WBC: 10.8 10*3/uL — ABNORMAL HIGH (ref 4.0–10.5)

## 2012-08-29 LAB — BASIC METABOLIC PANEL
CO2: 21 mEq/L (ref 19–32)
CO2: 20 mEq/L (ref 19–32)
Calcium: 8.7 mg/dL (ref 8.4–10.5)
Chloride: 104 mEq/L (ref 96–112)
Creatinine, Ser: 0.65 mg/dL (ref 0.50–1.35)
GFR calc non Af Amer: >90 mL/min (ref >90)
Glucose, Bld: 109 mg/dL — ABNORMAL HIGH (ref 70–99)
Sodium: 134 mEq/L — ABNORMAL LOW (ref 135–145)

## 2012-08-29 LAB — URINE CULTURE

## 2012-08-29 NOTE — Clinical Social Work Note (Signed)
CSW checked in to revisit concerns re. Medicaid. Pt and family met with financial counselor and aware of comm resources to assist. CSW signing off. RN CM to assist with medication assistance.   Doreen Salvage, LCSW ICU/Stepdown Clinical Social Worker [facility] Cell [phone] Hours 8am-1200pm M-F

## 2012-08-29 NOTE — Progress Notes (Signed)
[facility] PROGRESS NOTE  John [name] ZOX:[id number] DOB: 02-14-1991 DOA: 08/27/2012 PCP: Harlow Asa, MD  Brief narrative: 22 y.o. male with multiple medical problems including type 1 diabetes with prior DKA, last admission for DKA many years ago, presents to the emergency room with abdominal pain, nausea and vomiting and found to be in DKA with pH of 6.9 and bicarbonate of 4. Trigger may have been acute viral gastroenteritis as he had nausea and vomiting prior to admission with several family members sick with similar symptoms.   Assessment/Plan:   Principal Problem:  *DKA (diabetic ketoacidoses) / type 1 diabetes  Admitted to the ICU and placed on a glucose stabilizer and an insulin drip.Out of DKA and insulin drip discontinued Trigger appears to be viral gastroenteritis given normal chest x-ray and urinalysis negative for signs of infection. No flulike symptoms.  A1c 12.4  Active Problems:  Dehydration  Secondary to osmotic diuresis. Continue IV fluids Leukocytosis  Thought to be from severe volume depletion and dehydration. No obvious signs of infection. White blood cell count dropping with rehydration.  Alcohol intake above recommended sensible limits  Smoker  Counseled on cessation.   Code Status: Full code.  Family Communication: Mother updated at bedside.  Disposition Plan: Home when stable.    Medical Consultants:  None Other Consultants:  Diabetes coordinator Anti-infectives:  None  Manson Passey, MD  [facility] Pager [phone]  If 7PM-7AM, please contact night-coverage [email or web address] Password [email or web address] 08/29/2012, 7:00 PM   LOS: 2 days   HPI/Subjective: No acute overnight events.  Objective: Filed Vitals:   08/29/12 0900 08/29/12 1200 08/29/12 1500 08/29/12 1600  BP: 124/61 101/65 108/57   Pulse: 80 88 83   Temp:  98.1 F (36.7 C)  98.5 F (36.9 C)  TempSrc:  Oral  Oral  Resp: 13 14 16    Height:      Weight:      SpO2: 100% 99% 100%      Intake/Output Summary (Last 24 hours) at 08/29/12 1900 Last data filed at 08/29/12 1500  Gross per 24 hour  Intake 2635.77 ml  Output   1200 ml  Net 1435.77 ml    Exam:   General:  Pt is alert, follows commands appropriately, not in acute distress  Cardiovascular: Regular rate and rhythm, S1/S2, no murmurs, no rubs, no gallops  Respiratory: Clear to auscultation bilaterally, no wheezing, no crackles, no rhonchi  Abdomen: Soft, non tender, non distended, bowel sounds present, no guarding  Extremities: No edema, pulses DP and PT palpable bilaterally  Neuro: Grossly nonfocal  Data Reviewed: Basic Metabolic Panel:  Lab 08/29/12 8295 08/29/12 0150 08/28/12 2113 08/28/12 1734 08/28/12 1328  NA 134* 135 132* 135 134*  K 4.0 3.4* 3.7 3.3* 3.5  CL 104 105 104 107 106  CO2 20 21 19 19  18*  GLUCOSE 300* 109* 218* 141* 214*  BUN 17 16 18 18 16   CREATININE 0.69 0.65 0.70 0.75 0.60  CALCIUM 8.7 8.7 8.5 8.8 8.5  MG -- -- -- -- --  PHOS -- -- -- -- --   Liver Function Tests:  Lab 08/27/12 2355  AST 15  ALT 13  ALKPHOS 108  BILITOT 0.4  PROT 7.4  ALBUMIN 4.8    Lab 08/27/12 2355  LIPASE 17  AMYLASE --   No results found for this basename: AMMONIA:5 in the last 168 hours CBC:  Lab 08/29/12 0435 08/28/12 1010 08/28/12 0206 08/27/12 2355  WBC 10.8* 27.9* 38.6* 46.2*  NEUTROABS -- -- --  37.8*  HGB 14.7 15.4 16.1 16.7  HCT 40.1 41.8 44.9 48.3  MCV 80.5 79.8 82.8 84.3  PLT 286 413* 448* 699*   Cardiac Enzymes: No results found for this basename: CKTOTAL:5,CKMB:5,CKMBINDEX:5,TROPONINI:5 in the last 168 hours BNP: No components found with this basename: POCBNP:5 CBG:  Lab 08/29/12 1607 08/29/12 1228 08/29/12 0727 08/28/12 2237 08/28/12 2129  GLUCAP 229* 227* 299* 118* 219*    Recent Results (from the past 240 hour(s))  URINE CULTURE     Status: Normal   Collection Time   08/28/12  1:16 AM      Component Value Range Status Comment   Specimen Description URINE,  CLEAN CATCH   Final    Special Requests NONE   Final    Culture  Setup Time 08/28/2012 05:15   Final    Colony Count NO GROWTH   Final    Culture NO GROWTH   Final    Report Status 08/29/2012 FINAL   Final   CULTURE, BLOOD (ROUTINE X 2)     Status: Normal (Preliminary result)   Collection Time   08/28/12  1:56 AM      Component Value Range Status Comment   Specimen Description BLOOD RIGHT HAND   Final    Special Requests BOTTLES DRAWN AEROBIC ONLY 3CC   Final    Culture  Setup Time 08/28/2012 14:42   Final    Culture     Final    Value:        BLOOD CULTURE RECEIVED NO GROWTH TO DATE CULTURE WILL BE HELD FOR 5 DAYS BEFORE ISSUING A FINAL NEGATIVE REPORT   Report Status PENDING   Incomplete   CULTURE, BLOOD (ROUTINE X 2)     Status: Normal (Preliminary result)   Collection Time   08/28/12  2:06 AM      Component Value Range Status Comment   Specimen Description BLOOD RIGHT ARM   Final    Special Requests BOTTLES DRAWN AEROBIC ONLY 3CC   Final    Culture  Setup Time 08/28/2012 14:42   Final    Culture     Final    Value:        BLOOD CULTURE RECEIVED NO GROWTH TO DATE CULTURE WILL BE HELD FOR 5 DAYS BEFORE ISSUING A FINAL NEGATIVE REPORT   Report Status PENDING   Incomplete   MRSA PCR SCREENING     Status: Normal   Collection Time   08/28/12  2:24 AM      Component Value Range Status Comment   MRSA by PCR NEGATIVE  NEGATIVE Final      Studies: Dg Chest Port 1 View  08/28/2012  *RADIOLOGY REPORT*  Clinical Data: Nausea and vomiting.  Flu symptoms.  PORTABLE CHEST - 1 VIEW  Comparison: PA and lateral chest 10/16/2010.  Findings: Lungs clear.  Heart size normal.  No pneumothorax or pleural fluid.  No focal bony abnormality.  IMPRESSION: Negative chest.   Original Report Authenticated By: Holley Dexter, M.D.     Scheduled Meds:   . heparin  5,000 Units Subcutaneous Q8H  . insulin aspart  0-5 Units Subcutaneous QHS  . insulin aspart  0-9 Units Subcutaneous TID WC  . insulin aspart  protamine-insulin aspart  25 Units Subcutaneous Q supper  . insulin aspart protamine-insulin aspart  45 Units Subcutaneous Q breakfast  . insulin regular  5 Units Intravenous TID PC

## 2012-08-30 LAB — BASIC METABOLIC PANEL
BUN: 13 mg/dL (ref 6–23)
Calcium: 8.7 mg/dL (ref 8.4–10.5)
GFR calc Af Amer: >90 mL/min (ref >90)
GFR calc non Af Amer: >90 mL/min (ref >90)
Glucose, Bld: 260 mg/dL — ABNORMAL HIGH (ref 70–99)
Potassium: 3.4 mEq/L — ABNORMAL LOW (ref 3.5–5.1)

## 2012-08-30 LAB — CBC
HCT: 38.7 % — ABNORMAL LOW (ref 39.0–52.0)
Hemoglobin: 13.9 g/dL (ref 13.0–17.0)
MCH: 29.4 pg (ref 26.0–34.0)
MCHC: 35.9 g/dL (ref 30.0–36.0)
RDW: 12.5 % (ref 11.5–15.5)

## 2012-08-30 MED ORDER — INSULIN GLARGINE 100 UNIT/ML ~~LOC~~ SOLN: 10.0000 [IU] | Freq: Every day | SUBCUTANEOUS | Status: DC

## 2012-08-30 MED ORDER — INSULIN LISPRO PROT & LISPRO (75-25 MIX) 100 UNIT/ML ~~LOC~~ SUSP: 25.0000 [IU] | Freq: Every day | SUBCUTANEOUS | Status: DC

## 2012-08-30 MED ORDER — INSULIN ASPART PROT & ASPART (70-30 MIX) 100 UNIT/ML ~~LOC~~ SUSP: 25.0000 [IU] | Freq: Every day | SUBCUTANEOUS | Status: DC

## 2012-08-30 MED ORDER — INSULIN ASPART PROT & ASPART (70-30 MIX) 100 UNIT/ML ~~LOC~~ SUSP: 45.0000 [IU] | Freq: Every day | SUBCUTANEOUS | Status: DC

## 2012-08-30 MED ORDER — INSULIN LISPRO PROT & LISPRO (75-25 MIX) 100 UNIT/ML ~~LOC~~ SUSP: 25.0000 [IU] | Freq: Two times a day (BID) | SUBCUTANEOUS | Status: DC

## 2012-08-30 MED ORDER — OXYCODONE-ACETAMINOPHEN 5-325 MG PO TABS: 1.0000 | ORAL_TABLET | ORAL | Status: DC | PRN

## 2012-08-30 MED ORDER — INSULIN ASPART 100 UNIT/ML ~~LOC~~ SOLN: 0.0000 [IU] | Freq: Every day | SUBCUTANEOUS | Status: DC

## 2012-08-30 MED ORDER — INSULIN LISPRO PROT & LISPRO (75-25 MIX) 100 UNIT/ML ~~LOC~~ SUSP: 45.0000 [IU] | Freq: Every day | SUBCUTANEOUS | Status: DC

## 2012-08-30 MED ORDER — INSULIN ASPART 100 UNIT/ML ~~LOC~~ SOLN: 6.0000 [IU] | Freq: Three times a day (TID) | SUBCUTANEOUS | Status: DC

## 2012-08-30 MED ORDER — INSULIN ASPART 100 UNIT/ML ~~LOC~~ SOLN: 0.0000 [IU] | Freq: Three times a day (TID) | SUBCUTANEOUS | Status: DC

## 2012-08-30 MED ORDER — POTASSIUM CHLORIDE CRYS ER 20 MEQ PO TBCR
40.0000 meq | EXTENDED_RELEASE_TABLET | Freq: Once | ORAL | Status: AC
  Administered 2012-08-30: 40 meq via ORAL
  Filled 2012-08-30 (×2): qty 2

## 2012-08-30 NOTE — Progress Notes (Signed)
Pt ready for d/c. Went over d/c instructions, new insulin regimen, where to pick up prescriptions. Instructed to follow-up with PCP in two weeks. No change in pt condition since AM assessment. Pt d/c'd to home with mother.

## 2012-08-30 NOTE — Discharge Summary (Signed)
Physician Discharge Summary  GAD AYMOND ZOX:[id number] DOB: 05-16-1991 DOA: 08/27/2012  PCP: Harlow Asa, MD  Admit date: 08/27/2012 Discharge date: 08/30/2012  Recommendations for Outpatient Follow-up:  1. Please followup with your endocrinologist in regards to initiating insulin pump 2. At the time of discharge we will continue same home regimen which is insulin 45 units with breakfast and 25 units at bedtime along with sliding scale 3 times a day a.c. at bedtime 3. Patient and his mother were strongly encouraged to followup and make sure to complete the paperwork for insurance  Discharge Diagnoses:  Principal Problem:  *DKA (diabetic ketoacidoses) Active Problems:  Type I (juvenile type) diabetes mellitus without mention of complication, uncontrolled  Alcohol intake above recommended sensible limits  Smoker  Dehydration  Leukocytosis  Discharge Condition: Medically stable for discharge home today  Diet recommendation: Carb modified diet, as tolerated  History of present illness:  22 y.o. male with multiple medical problems including type 1 diabetes with prior DKA, last admission for DKA many years ago, presents to the emergency room with abdominal pain, nausea and vomiting and found to be in DKA with pH of 6.9 and bicarbonate of 4. Trigger may have been acute viral gastroenteritis as he had nausea and vomiting prior to admission with several family members sick with similar symptoms. White blood cell count was elevated at 46 on admission and viral gastroenteritis was thought to be contributory. Y. blood cell count is now within normal limits. Please note that he has trended down with no antibiotics. Antibiotics were not started on admission and it was not added the following days because white blood cell count was trending down and there was no obvious source of infection.  Assessment/Plan:   Principal Problem:  *DKA (diabetic ketoacidoses) / type 1 diabetes  Admitted to the ICU  and placed on a glucose stabilizer and an insulin drip.Out of DKA and insulin drip discontinued  Trigger appears to be viral gastroenteritis given normal chest x-ray and urinalysis negative for signs of infection. No flulike symptoms.  A1c 12.4 At the time of discharge we will continue same home regimen and encourage compliance. Patient and his family need to followup in regards to completing the paperwork for insurance as recommended by financial counselor. Patient will continue insulin 45 units with breakfast and 25 units at bedtime along with the sliding scale 3 times a day a.c. and at bedtime  Active Problems:  Dehydration  Secondary to osmotic diuresis. Continue IV fluids Leukocytosis  Thought to be from severe volume depletion and dehydration. No obvious signs of infection. White blood cell count dropping with rehydration. Alcohol intake above recommended sensible limits  Smoker  Counseled on cessation.   Code Status: Full code.  Family Communication: Mother updated at bedside.  Disposition Plan: Home today   Medical Consultants:  None Other Consultants:  Diabetes coordinator Financial counselor Anti-infectives:  None   Manson Passey, MD  [facility]  Pager [phone]   Discharge Exam: Filed Vitals:   08/30/12 1004  BP: 101/49  Pulse: 90  Temp: 98.1 F (36.7 C)  Resp: 20   Filed Vitals:   08/29/12 1908 08/29/12 2130 08/30/12 0536 08/30/12 1004  BP: 116/66 116/73 106/59 101/49  Pulse: 93 85 72 90  Temp: 97.9 F (36.6 C) 98.4 F (36.9 C) 97.6 F (36.4 C) 98.1 F (36.7 C)  TempSrc: Oral Oral Oral Oral  Resp: 22 18 16 20   Height:      Weight:      SpO2:  99% 100% 100% 100%    General: Pt is alert, follows commands appropriately, not in acute distress Cardiovascular: Regular rate and rhythm, S1/S2 +, no murmurs, no rubs, no gallops Respiratory: Clear to auscultation bilaterally, no wheezing, no crackles, no rhonchi Abdominal: Soft, non tender, non distended, bowel  sounds +, no guarding Extremities: no edema, no cyanosis, pulses palpable bilaterally DP and PT Neuro: Grossly nonfocal  Discharge Instructions  Discharge Orders    Future Orders Please Complete By Expires   Diet - low sodium heart healthy      Increase activity slowly      Discharge instructions      Comments:   Please continue taking insulin per previous home regimen. Prescriptions provided. Followup in regards to insurance paperwork. This is very important as compliance with insulin will insure that your blood sugars are controlled adequately. In regards to insulin pump, please followup with endocrinologist and see if this can be set up as an outpatient.   Call MD for:  persistant nausea and vomiting      Call MD for:  severe uncontrolled pain      Call MD for:  difficulty breathing, headache or visual disturbances      Call MD for:  persistant dizziness or light-headedness          Medication List     As of 08/30/2012 10:32 AM    TAKE these medications         insulin aspart 100 UNIT/ML injection   Commonly known as: novoLOG   Inject 0-5 Units into the skin at bedtime.      insulin aspart 100 UNIT/ML injection   Commonly known as: novoLOG   Inject 0-9 Units into the skin 3 (three) times daily with meals.      insulin aspart 100 UNIT/ML injection   Commonly known as: novoLOG   Inject 6-10 Units into the skin 3 (three) times daily before meals.      insulin aspart protamine-insulin aspart (70-30) 100 UNIT/ML injection   Commonly known as: NOVOLOG 70/30   Inject 45 Units into the skin daily with breakfast.      insulin aspart protamine-insulin aspart (70-30) 100 UNIT/ML injection   Commonly known as: NOVOLOG 70/30   Inject 25 Units into the skin daily with supper.      insulin glargine 100 UNIT/ML injection   Commonly known as: LANTUS   Inject 10-19 Units into the skin at bedtime.      insulin lispro protamine-insulin lispro (75-25) 100 UNIT/ML Susp   Commonly known  as: HUMALOG 75/25   Inject 45 Units into the skin daily with breakfast.      insulin lispro protamine-insulin lispro (75-25) 100 UNIT/ML Susp   Commonly known as: HUMALOG 75/25   Inject 25 Units into the skin daily with supper.      oxyCODONE-acetaminophen 5-325 MG per tablet   Commonly known as: PERCOCET/ROXICET   Take 1 tablet by mouth every 4 (four) hours as needed for pain.           Follow-up Information    Follow up with Harlow Asa, MD. In 2 weeks.   Contact information:   [address] [place] [address] [phone]           The results of significant diagnostics from this hospitalization (including imaging, microbiology, ancillary and laboratory) are listed below for reference.    Significant Diagnostic Studies: Dg Chest Port 1 View  08/28/2012  *RADIOLOGY REPORT*  Clinical  Data: Nausea and vomiting.  Flu symptoms.  PORTABLE CHEST - 1 VIEW  Comparison: PA and lateral chest 10/16/2010.  Findings: Lungs clear.  Heart size normal.  No pneumothorax or pleural fluid.  No focal bony abnormality.  IMPRESSION: Negative chest.   Original Report Authenticated By: Holley Dexter, M.D.     Microbiology: Recent Results (from the past 240 hour(s))  URINE CULTURE     Status: Normal   Collection Time   08/28/12  1:16 AM      Component Value Range Status Comment   Specimen Description URINE, CLEAN CATCH   Final    Special Requests NONE   Final    Culture  Setup Time 08/28/2012 05:15   Final    Colony Count NO GROWTH   Final    Culture NO GROWTH   Final    Report Status 08/29/2012 FINAL   Final   CULTURE, BLOOD (ROUTINE X 2)     Status: Normal (Preliminary result)   Collection Time   08/28/12  1:56 AM      Component Value Range Status Comment   Specimen Description BLOOD RIGHT HAND   Final    Special Requests BOTTLES DRAWN AEROBIC ONLY 3CC   Final    Culture  Setup Time 08/28/2012 14:42   Final    Culture     Final    Value:        BLOOD CULTURE RECEIVED NO  GROWTH TO DATE CULTURE WILL BE HELD FOR 5 DAYS BEFORE ISSUING A FINAL NEGATIVE REPORT   Report Status PENDING   Incomplete   CULTURE, BLOOD (ROUTINE X 2)     Status: Normal (Preliminary result)   Collection Time   08/28/12  2:06 AM      Component Value Range Status Comment   Specimen Description BLOOD RIGHT ARM   Final    Special Requests BOTTLES DRAWN AEROBIC ONLY 3CC   Final    Culture  Setup Time 08/28/2012 14:42   Final    Culture     Final    Value:        BLOOD CULTURE RECEIVED NO GROWTH TO DATE CULTURE WILL BE HELD FOR 5 DAYS BEFORE ISSUING A FINAL NEGATIVE REPORT   Report Status PENDING   Incomplete   MRSA PCR SCREENING     Status: Normal   Collection Time   08/28/12  2:24 AM      Component Value Range Status Comment   MRSA by PCR NEGATIVE  NEGATIVE Final      Labs: Basic Metabolic Panel:  Lab 08/30/12 1610 08/29/12 0535 08/29/12 0150 08/28/12 2113 08/28/12 1734  NA 136 134* 135 132* 135  K 3.4* 4.0 3.4* 3.7 3.3*  CL 99 104 105 104 107  CO2 30 20 21 19 19   GLUCOSE 260* 300* 109* 218* 141*  BUN 13 17 16 18 18   CREATININE 0.63 0.69 0.65 0.70 0.75  CALCIUM 8.7 8.7 8.7 8.5 8.8   Liver Function Tests:  Lab 08/27/12 2355  AST 15  ALT 13  ALKPHOS 108  BILITOT 0.4  PROT 7.4  ALBUMIN 4.8    Lab 08/27/12 2355  LIPASE 17  AMYLASE --   CBC:  Lab 08/30/12 0345 08/29/12 0435 08/28/12 1010 08/28/12 0206 08/27/12 2355  WBC 6.5 10.8* 27.9* 38.6* 46.2*  NEUTROABS -- -- -- -- 37.8*  HGB 13.9 14.7 15.4 16.1 16.7  HCT 38.7* 40.1 41.8 44.9 48.3  MCV 81.8 80.5 79.8 82.8 84.3  PLT 238 286  413* 448* 699*   CBG:  Lab 08/30/12 0734 08/29/12 2212 08/29/12 1607 08/29/12 1228 08/29/12 0727  GLUCAP 289* 298* 229* 227* 299*    Time coordinating discharge: Over 30 minutes  Signed:  Manson Passey, MD  [facility] 08/30/2012, 10:32 AM  Pager #: [phone]

## 2012-08-31 ENCOUNTER — Telehealth: Payer: Self-pay | Admitting: Endocrinology

## 2012-08-31 LAB — GLUCOSE, CAPILLARY: Glucose-Capillary: 178 mg/dL — ABNORMAL HIGH (ref 70–99)

## 2012-08-31 NOTE — Telephone Encounter (Signed)
Tried calling pt back at all #'s listed unable to reach pt.

## 2012-08-31 NOTE — Telephone Encounter (Signed)
Unable to reach caller on callback attempt.

## 2012-09-03 LAB — CULTURE, BLOOD (ROUTINE X 2): Culture: NO GROWTH

## 2013-01-03 ENCOUNTER — Other Ambulatory Visit: Payer: Self-pay | Admitting: *Deleted

## 2013-01-03 MED ORDER — GLUCOSE BLOOD VI STRP: ORAL_STRIP | Status: DC

## 2013-01-04 ENCOUNTER — Other Ambulatory Visit: Payer: Self-pay

## 2013-01-29 ENCOUNTER — Emergency Department (HOSPITAL_BASED_OUTPATIENT_CLINIC_OR_DEPARTMENT_OTHER): Payer: BC Managed Care – PPO

## 2013-01-29 ENCOUNTER — Emergency Department (HOSPITAL_BASED_OUTPATIENT_CLINIC_OR_DEPARTMENT_OTHER)
Admission: EM | Admit: 2013-01-29 | Discharge: 2013-01-29 | Disposition: A | Discharge status: Home / Self Care | Payer: BC Managed Care – PPO | Attending: Emergency Medicine | Admitting: Emergency Medicine

## 2013-01-29 ENCOUNTER — Encounter (HOSPITAL_BASED_OUTPATIENT_CLINIC_OR_DEPARTMENT_OTHER): Payer: Self-pay | Admitting: Family Medicine

## 2013-01-29 DIAGNOSIS — I10 Essential (primary) hypertension: Secondary | ICD-10-CM | POA: Insufficient documentation

## 2013-01-29 DIAGNOSIS — Z79899 Other long term (current) drug therapy: Secondary | ICD-10-CM | POA: Insufficient documentation

## 2013-01-29 DIAGNOSIS — T148XXA Other injury of unspecified body region, initial encounter: Secondary | ICD-10-CM

## 2013-01-29 DIAGNOSIS — E1065 Type 1 diabetes mellitus with hyperglycemia: Secondary | ICD-10-CM | POA: Insufficient documentation

## 2013-01-29 DIAGNOSIS — Z862 Personal history of diseases of the blood and blood-forming organs and certain disorders involving the immune mechanism: Secondary | ICD-10-CM | POA: Insufficient documentation

## 2013-01-29 DIAGNOSIS — R079 Chest pain, unspecified: Secondary | ICD-10-CM | POA: Insufficient documentation

## 2013-01-29 DIAGNOSIS — Y939 Activity, unspecified: Secondary | ICD-10-CM | POA: Insufficient documentation

## 2013-01-29 DIAGNOSIS — Y929 Unspecified place or not applicable: Secondary | ICD-10-CM | POA: Insufficient documentation

## 2013-01-29 DIAGNOSIS — Z8639 Personal history of other endocrine, nutritional and metabolic disease: Secondary | ICD-10-CM | POA: Insufficient documentation

## 2013-01-29 DIAGNOSIS — F17200 Nicotine dependence, unspecified, uncomplicated: Secondary | ICD-10-CM | POA: Insufficient documentation

## 2013-01-29 DIAGNOSIS — Z794 Long term (current) use of insulin: Secondary | ICD-10-CM | POA: Insufficient documentation

## 2013-01-29 DIAGNOSIS — Z8659 Personal history of other mental and behavioral disorders: Secondary | ICD-10-CM | POA: Insufficient documentation

## 2013-01-29 DIAGNOSIS — IMO0002 Reserved for concepts with insufficient information to code with codable children: Secondary | ICD-10-CM | POA: Insufficient documentation

## 2013-01-29 DIAGNOSIS — X58XXXA Exposure to other specified factors, initial encounter: Secondary | ICD-10-CM | POA: Insufficient documentation

## 2013-01-29 LAB — GLUCOSE, CAPILLARY: Glucose-Capillary: 373 mg/dL — ABNORMAL HIGH (ref 70–99)

## 2013-01-29 LAB — URINALYSIS, ROUTINE W REFLEX MICROSCOPIC
Leukocytes, UA: NEGATIVE
Nitrite: NEGATIVE
Specific Gravity, Urine: 1.045 — ABNORMAL HIGH (ref 1.005–1.030)
Urobilinogen, UA: 1 mg/dL (ref 0.0–1.0)
pH: 7 (ref 5.0–8.0)

## 2013-01-29 LAB — BASIC METABOLIC PANEL
BUN: 15 mg/dL (ref 6–23)
Chloride: 98 mEq/L (ref 96–112)
Creatinine, Ser: 0.8 mg/dL (ref 0.50–1.35)
GFR calc Af Amer: >90 mL/min (ref >90)

## 2013-01-29 LAB — URINE MICROSCOPIC-ADD ON

## 2013-01-29 MED ORDER — HYDROCODONE-ACETAMINOPHEN 5-325 MG PO TABS
2.0000 | ORAL_TABLET | Freq: Once | ORAL | Status: AC
  Administered 2013-01-29: 2 via ORAL

## 2013-01-29 MED ORDER — HYDROCODONE-ACETAMINOPHEN 5-325 MG PO TABS
ORAL_TABLET | ORAL | Status: AC
  Filled 2013-01-29: qty 2

## 2013-01-29 MED ORDER — HYDROCODONE-ACETAMINOPHEN 5-325 MG PO TABS: 2.0000 | ORAL_TABLET | ORAL | Status: DC | PRN

## 2013-01-29 NOTE — ED Notes (Signed)
CBG - 373. RN aware of value.

## 2013-01-29 NOTE — ED Provider Notes (Addendum)
History     CSN: [id number]  Arrival date & time 01/29/13  4098   First MD Initiated Contact with Patient 01/29/13 0825      Chief Complaint  Patient presents with  . Flank Pain    (Consider location/radiation/quality/duration/timing/severity/associated sxs/prior treatment) Patient is a 22 y.o. male presenting with flank pain.  Flank Pain Associated symptoms include chest pain and abdominal pain.   Complains of left chest pain and left abdominal pain and left flank pain onset yesterday afternoon. The pain is mild to moderate. Worse with changing positions improved with remaining still not affected by food not affected by exertion no shortness of breath no cough no fever no vomiting last bowel movement yesterday normal. No urinary symptoms No other associated symptoms. Treated with Aleve without relief. Pain is dull in nature. Pain is constant mild at present Past Medical History  Diagnosis Date  . 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 Essential hypertension, benign 02/04/2011  . Goiter, unspecified 02/04/2011  . ADD (attention deficit disorder)     Past Surgical History  Procedure Laterality Date  . Tonsillectomy      Family History  Problem Relation Age of Onset  . Cancer Neg Hx     History  Substance Use Topics  . Smoking status: Current Every Day Smoker -- 1.00 packs/day for 1 years    Types: Cigarettes  . Smokeless tobacco: Not on file  . Alcohol Use: Yes      Review of Systems  Constitutional: Negative.   HENT: Negative.   Respiratory: Negative.   Cardiovascular: Positive for chest pain.  Gastrointestinal: Positive for abdominal pain.  Genitourinary: Positive for flank pain.  Musculoskeletal: Negative.   Skin: Negative.   Neurological: Negative.   Psychiatric/Behavioral: Negative.     Allergies  Review of patient's allergies indicates no known allergies.  Home Medications   Current  Outpatient Rx  Name  Route  Sig  Dispense  Refill  . glucose blood test strip      Use as instructed   100 each   1     Accu-chek Compact Drum Strips. Pt needs to schedul ...   . insulin aspart (NOVOLOG) 100 UNIT/ML injection   Subcutaneous   Inject 0-5 Units into the skin at bedtime.   1 vial   0     Please provide 3 day supply   . insulin aspart (NOVOLOG) 100 UNIT/ML injection   Subcutaneous   Inject 0-9 Units into the skin 3 (three) times daily with meals.   1 vial   0     Please provide 3 day supply   . insulin aspart (NOVOLOG) 100 UNIT/ML injection   Subcutaneous   Inject 6-10 Units into the skin 3 (three) times daily before meals.   1 vial   3   . insulin aspart protamine-insulin aspart (NOVOLOG 70/30) (70-30) 100 UNIT/ML injection   Subcutaneous   Inject 45 Units into the skin daily with breakfast.   10 mL   0     Please provide a three-day supply   . insulin aspart protamine-insulin aspart (NOVOLOG 70/30) (70-30) 100 UNIT/ML injection   Subcutaneous   Inject 25 Units into the skin daily with supper.   10 mL   0     Please provide 3 day supply   . insulin glargine (LANTUS) 100 UNIT/ML injection   Subcutaneous   Inject 10-19 Units into the skin at bedtime.   10 mL  3   . insulin lispro protamine-insulin lispro (HUMALOG 75/25) (75-25) 100 UNIT/ML SUSP   Subcutaneous   Inject 45 Units into the skin daily with breakfast.   10 mL   3   . insulin lispro protamine-insulin lispro (HUMALOG 75/25) (75-25) 100 UNIT/ML SUSP   Subcutaneous   Inject 25 Units into the skin daily with supper.   10 mL   3   . oxyCODONE-acetaminophen (PERCOCET/ROXICET) 5-325 MG per tablet   Oral   Take 1 tablet by mouth every 4 (four) hours as needed for pain.   30 tablet   0     BP 117/62  Pulse 72  Temp(Src) 98.1 F (36.7 C) (Oral)  Resp 16  SpO2 100%  Physical Exam  Nursing note and vitals reviewed. Constitutional: He appears well-developed and  well-nourished.  HENT:  Head: Normocephalic and atraumatic.  Eyes: Conjunctivae are normal. Pupils are equal, round, and reactive to light.  Neck: Neck supple. No tracheal deviation present. No thyromegaly present.  Cardiovascular: Normal rate and regular rhythm.   No murmur heard. Pulmonary/Chest: Effort normal and breath sounds normal. He exhibits tenderness.  Chest is tender left side anterolateral aspect. Pain is brief producible exactly by forcible flexion of left shoulder.  Abdominal: Soft. Bowel sounds are normal. He exhibits no distension. There is tenderness.  Minimal tenderness left upper quadrant pain is reproducible when he sits up from a supine position  Genitourinary: Penis normal.  Musculoskeletal: Normal range of motion. He exhibits no edema and no tenderness.  Neurological: He is alert. Coordination normal.  Skin: Skin is warm and dry. No rash noted.  Psychiatric: He has a normal mood and affect.    ED Course  Procedures (including critical care time)  Labs Reviewed  URINALYSIS, ROUTINE W REFLEX MICROSCOPIC   No results found.   No diagnosis found.  Chest x-ray viewed by me. Lab results reviewed by me 1010 a.m. patient feels well after treatment with Vicodin. Councilled5 minutes on smoking cessation  Results for orders placed during the hospital encounter of 01/29/13  URINALYSIS, ROUTINE W REFLEX MICROSCOPIC      Result Value Range   Color, Urine YELLOW  YELLOW   APPearance CLEAR  CLEAR   Specific Gravity, Urine 1.045 (*) 1.005 - 1.030   pH 7.0  5.0 - 8.0   Glucose, UA >1000 (*) NEGATIVE mg/dL   Hgb urine dipstick NEGATIVE  NEGATIVE   Bilirubin Urine NEGATIVE  NEGATIVE   Ketones, ur NEGATIVE  NEGATIVE mg/dL   Protein, ur NEGATIVE  NEGATIVE mg/dL   Urobilinogen, UA 1.0  0.0 - 1.0 mg/dL   Nitrite NEGATIVE  NEGATIVE   Leukocytes, UA NEGATIVE  NEGATIVE  GLUCOSE, CAPILLARY      Result Value Range   Glucose-Capillary 373 (*) 70 - 99 mg/dL   Comment 1  Documented in Chart     Comment 2 Notify RN    URINE MICROSCOPIC-ADD ON      Result Value Range   Squamous Epithelial / LPF RARE  RARE   WBC, UA 0-2  <3 WBC/hpf   Bacteria, UA RARE  RARE  BASIC METABOLIC PANEL      Result Value Range   Sodium 137  135 - 145 mEq/L   Potassium 4.4  3.5 - 5.1 mEq/L   Chloride 98  96 - 112 mEq/L   CO2 32  19 - 32 mEq/L   Glucose, Bld 386 (*) 70 - 99 mg/dL   BUN 15  6 - 23  mg/dL   Creatinine, Ser 4.09  0.50 - 1.35 mg/dL   Calcium 9.8  8.4 - 81.1 mg/dL   GFR calc non Af Amer >90  >90 mL/min   GFR calc Af Amer >90  >90 mL/min   Dg Chest 2 View  01/29/2013   *RADIOLOGY REPORT*  Clinical Data: Chest pain  CHEST - 2 VIEW  Comparison: August 28, 2012  Findings: Lungs clear.  Heart size and pulmonary vascularity are normal.  No adenopathy.  No bone lesions.  No pneumothorax.  IMPRESSION: No abnormality noted.   Original Report Authenticated By: Bretta Bang, M.D.    MDM  Plan prescription Vicodin. Patient instructed to call Dr. Everardo All today to schedule next available office appointment regularly his blood sugars and insulin regimen. He states that his blood sugars sometimes run low in the 50s. He is instructed to check blood sugar a meal upon arrival home and give himself the appropriate sliding dose of insulin from his sliding scale. Pain is felt to be musculoskeletal in etiology. Diagnosis #1 muscle strain #2 hyperglycemia #3 tobacco abuse        Doug Sou, MD 01/29/13 1010  Doug Sou, MD 01/29/13 1011

## 2013-01-29 NOTE — ED Notes (Signed)
Pt c/o left side pain, tender to touch and worse with cough and movement. Denies injury. Denies dysuria.

## 2013-02-04 ENCOUNTER — Encounter: Payer: Self-pay | Admitting: Endocrinology

## 2013-02-04 ENCOUNTER — Ambulatory Visit (INDEPENDENT_AMBULATORY_CARE_PROVIDER_SITE_OTHER): Payer: BC Managed Care – PPO | Admitting: Endocrinology

## 2013-02-04 VITALS — BP 122/80 | HR 97 | Ht 71.0 in | Wt 160.0 lb

## 2013-02-04 DIAGNOSIS — E1065 Type 1 diabetes mellitus with hyperglycemia: Secondary | ICD-10-CM

## 2013-02-04 NOTE — Patient Instructions (Addendum)
check your blood sugar twice a day.  vary the time of day when you check, between before the 3 meals, and at bedtime.  also check if you have symptoms of your blood sugar being too high or too low.  please keep a record of the readings and bring it to your next appointment here.  please call us sooner if your blood sugar goes below 70, or if you have a lot of readings over 200.  Take humulin 70/30, 35 units with breakfast, and 15 with the evening meal.   Please come back for a follow-up appointment in 3 months.

## 2013-02-04 NOTE — Progress Notes (Signed)
  Subjective:    Patient ID: John [name], male    DOB: 22-Oct-1990, 22 y.o.   MRN: [id number]  HPI Pt returns for f/u of type 1 dm (dx'ed 2008; He has mild if any neuropathy of the lower extremities. no known associated complications; he has struggled with compliance issues; he says he has never had an episode of severe hypoglycemia; he had DKA in march, 2014).   He says his medicaid is pending again, but he has no health insurance now.  no cbg record, but states he has mild hypoglycemia daily during the week.  He says activity at work significantly decreases his insulin requirement.  Past Medical History  Diagnosis Date  . 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 Essential hypertension, benign 02/04/2011  . Goiter, unspecified 02/04/2011  . ADD (attention deficit disorder)     Past Surgical History  Procedure Laterality Date  . Tonsillectomy      History   Social History  . Marital Status: Single    Spouse Name: N/A    Number of Children: N/A  . Years of Education: N/A   Occupational History  . Not on file.   Social History Main Topics  . Smoking status: Current Every Day Smoker -- 1.00 packs/day for 1 years    Types: Cigarettes  . Smokeless tobacco: Not on file  . Alcohol Use: Yes  . Drug Use: No  . Sexually Active: Not on file   Other Topics Concern  . Not on file   Social History Narrative   Regular exercise-yes    No current outpatient prescriptions on file prior to visit.   No current facility-administered medications on file prior to visit.    No Known Allergies  Family History  Problem Relation Age of Onset  . Cancer Neg Hx     BP 122/80  Pulse 97  Ht 5\' 11"  (1.803 m)  Wt 160 lb (72.576 kg)  BMI 22.33 kg/m2  SpO2 98%  Review of Systems Denies LOC and weight change    Objective:   Physical Exam VITAL SIGNS:  See vs page GENERAL: no distress  Lab Results  Component Value Date   HGBA1C 10.3* 02/04/2013      Assessment & Plan:  DM: This insulin regimen was chosen from multiple options, for its relative simplicity and low cost.  The benefits of glycemic control must be weighed against the risks of hypoglycemia.

## 2013-02-13 ENCOUNTER — Emergency Department (HOSPITAL_BASED_OUTPATIENT_CLINIC_OR_DEPARTMENT_OTHER)
Admission: EM | Admit: 2013-02-13 | Discharge: 2013-02-13 | Disposition: A | Discharge status: Home / Self Care | Payer: BC Managed Care – PPO | Attending: Emergency Medicine | Admitting: Emergency Medicine

## 2013-02-13 ENCOUNTER — Encounter (HOSPITAL_BASED_OUTPATIENT_CLINIC_OR_DEPARTMENT_OTHER): Payer: Self-pay | Admitting: *Deleted

## 2013-02-13 ENCOUNTER — Telehealth: Payer: Self-pay

## 2013-02-13 DIAGNOSIS — Z794 Long term (current) use of insulin: Secondary | ICD-10-CM | POA: Insufficient documentation

## 2013-02-13 DIAGNOSIS — F17200 Nicotine dependence, unspecified, uncomplicated: Secondary | ICD-10-CM | POA: Insufficient documentation

## 2013-02-13 DIAGNOSIS — Z862 Personal history of diseases of the blood and blood-forming organs and certain disorders involving the immune mechanism: Secondary | ICD-10-CM | POA: Insufficient documentation

## 2013-02-13 DIAGNOSIS — R739 Hyperglycemia, unspecified: Secondary | ICD-10-CM

## 2013-02-13 DIAGNOSIS — E1169 Type 2 diabetes mellitus with other specified complication: Secondary | ICD-10-CM | POA: Insufficient documentation

## 2013-02-13 DIAGNOSIS — Z8659 Personal history of other mental and behavioral disorders: Secondary | ICD-10-CM | POA: Insufficient documentation

## 2013-02-13 DIAGNOSIS — I10 Essential (primary) hypertension: Secondary | ICD-10-CM | POA: Insufficient documentation

## 2013-02-13 DIAGNOSIS — Z8639 Personal history of other endocrine, nutritional and metabolic disease: Secondary | ICD-10-CM | POA: Insufficient documentation

## 2013-02-13 LAB — URINE MICROSCOPIC-ADD ON

## 2013-02-13 LAB — URINALYSIS, ROUTINE W REFLEX MICROSCOPIC
Glucose, UA: >1000 mg/dL — AB
Ketones, ur: NEGATIVE mg/dL
Leukocytes, UA: NEGATIVE
Nitrite: NEGATIVE
Specific Gravity, Urine: >1.046 — ABNORMAL HIGH (ref 1.005–1.030)
pH: 7 (ref 5.0–8.0)

## 2013-02-13 LAB — CBC WITH DIFFERENTIAL/PLATELET
Basophils Absolute: 0 10*3/uL (ref 0.0–0.1)
Basophils Relative: 1 % (ref 0–1)
Eosinophils Absolute: 0.1 10*3/uL (ref 0.0–0.7)
Eosinophils Relative: 2 % (ref 0–5)
HCT: 44.5 % (ref 39.0–52.0)
Hemoglobin: 16.4 g/dL (ref 13.0–17.0)
Lymphocytes Relative: 42 % (ref 12–46)
Lymphs Abs: 2.9 10*3/uL (ref 0.7–4.0)
MCH: 30 pg (ref 26.0–34.0)
MCHC: 36.9 g/dL — ABNORMAL HIGH (ref 30.0–36.0)
MCV: 81.5 fL (ref 78.0–100.0)
Monocytes Absolute: 0.6 10*3/uL (ref 0.1–1.0)
Monocytes Relative: 9 % (ref 3–12)
Neutro Abs: 3.3 10*3/uL (ref 1.7–7.7)
Neutrophils Relative %: 47 % (ref 43–77)
Platelets: 348 10*3/uL (ref 150–400)
RBC: 5.46 MIL/uL (ref 4.22–5.81)
RDW: 12.1 % (ref 11.5–15.5)
WBC: 7 10*3/uL (ref 4.0–10.5)

## 2013-02-13 LAB — BASIC METABOLIC PANEL
BUN: 14 mg/dL (ref 6–23)
CO2: 28 mEq/L (ref 19–32)
Calcium: 9.5 mg/dL (ref 8.4–10.5)
Chloride: 100 mEq/L (ref 96–112)
Creatinine, Ser: 0.6 mg/dL (ref 0.50–1.35)
GFR calc Af Amer: >90 mL/min (ref >90)
GFR calc non Af Amer: >90 mL/min (ref >90)
Glucose, Bld: 348 mg/dL — ABNORMAL HIGH (ref 70–99)
Potassium: 4 mEq/L (ref 3.5–5.1)
Sodium: 137 mEq/L (ref 135–145)

## 2013-02-13 LAB — GLUCOSE, CAPILLARY
Glucose-Capillary: 312 mg/dL — ABNORMAL HIGH (ref 70–99)
Glucose-Capillary: 277 mg/dL — ABNORMAL HIGH (ref 70–99)

## 2013-02-13 MED ORDER — SODIUM CHLORIDE 0.9 % IV BOLUS (SEPSIS)
1000.0000 mL | Freq: Once | INTRAVENOUS | Status: AC
  Administered 2013-02-13: 1000 mL via INTRAVENOUS

## 2013-02-13 MED ORDER — ACETAMINOPHEN 325 MG PO TABS
ORAL_TABLET | ORAL | Status: AC
  Administered 2013-02-13: 650 mg
  Filled 2013-02-13: qty 2

## 2013-02-13 NOTE — ED Provider Notes (Signed)
History    CSN: [id number] Arrival date & time 02/13/13  1129  First MD Initiated Contact with Patient 02/13/13 1132     Chief Complaint  Patient presents with  . Hyperglycemia   (Consider location/radiation/quality/duration/timing/severity/associated sxs/prior Treatment) Patient is a 22 y.o. male presenting with hyperglycemia.  Hyperglycemia  Pt with history of Type 1 DM reports he has been compliant with his insulin regimen (70/30 50Units in AM and 30Units in PM with SSI for meals). He has had elevated sugar since last night, associated with nausea, vomiting, and polyuria. He has had mild cough recently but no fever. He has headache this morning but otherwise he is at baseline. Significant other at bedside states he was given a 70/30 regiment at last PCP/Endo office visit with no SSI but patient states he has continued to use his regular insulin during the day and took 15Units of regular before coming to the ED.   Past Medical History  Diagnosis Date  . 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 Essential hypertension, benign 02/04/2011  . Goiter, unspecified 02/04/2011  . ADD (attention deficit disorder)    Past Surgical History  Procedure Laterality Date  . Tonsillectomy     Family History  Problem Relation Age of Onset  . Cancer Neg Hx    History  Substance Use Topics  . Smoking status: Current Every Day Smoker -- 1.00 packs/day for 1 years    Types: Cigarettes  . Smokeless tobacco: Not on file  . Alcohol Use: No    Review of Systems All other systems reviewed and are negative except as noted in HPI.   Allergies  Review of patient's allergies indicates no known allergies.  Home Medications   Current Outpatient Rx  Name  Route  Sig  Dispense  Refill  . insulin aspart (NOVOLOG) 100 UNIT/ML injection   Subcutaneous   Inject into the skin 3 (three) times daily before meals.         Marland Kitchen glucose blood test  strip   Other   1 each by Other route 2 (two) times daily. Use as instructed         . Insulin Isophane & Regular (HUMULIN 70/30 [place])   Subcutaneous   Inject into the skin. 35 units with breakfast, and 15 units with the evening meal          BP 113/71  Pulse 85  Temp(Src) 98.1 F (36.7 C) (Oral)  Resp 18  Ht 5\' 11"  (1.803 m)  Wt 164 lb (74.39 kg)  BMI 22.88 kg/m2  SpO2 100% Physical Exam  Nursing note and vitals reviewed. Constitutional: He is oriented to person, place, and time. He appears well-developed and well-nourished.  HENT:  Head: Normocephalic and atraumatic.  Eyes: EOM are normal. Pupils are equal, round, and reactive to light.  Neck: Normal range of motion. Neck supple.  Cardiovascular: Normal rate, normal heart sounds and intact distal pulses.   Pulmonary/Chest: Effort normal and breath sounds normal.  Abdominal: Bowel sounds are normal. He exhibits no distension. There is no tenderness.  Musculoskeletal: Normal range of motion. He exhibits no edema and no tenderness.  Neurological: He is alert and oriented to person, place, and time. He has normal strength. No cranial nerve deficit or sensory deficit.  Skin: Skin is warm and dry. No rash noted.  Psychiatric: He has a normal mood and affect.    ED Course  Procedures (including critical care time) Labs Reviewed  GLUCOSE, CAPILLARY - Abnormal; Notable for the following:    Glucose-Capillary 312 (*)    All other components within normal limits  CBC WITH DIFFERENTIAL - Abnormal; Notable for the following:    MCHC 36.9 (*)    All other components within normal limits  BASIC METABOLIC PANEL - Abnormal; Notable for the following:    Glucose, Bld 348 (*)    All other components within normal limits  URINALYSIS, ROUTINE W REFLEX MICROSCOPIC - Abnormal; Notable for the following:    Specific Gravity, Urine >1.046 (*)    Glucose, UA >1000 (*)    All other components within normal limits  GLUCOSE, CAPILLARY -  Abnormal; Notable for the following:    Glucose-Capillary 277 (*)    All other components within normal limits  URINE MICROSCOPIC-ADD ON   No results found. 1. Hyperglycemia     MDM  Labs reviewed, no evidence of DKA, CBG improving with IVF only. Advised to continue with home regimen and follow up with Dr. Everardo All for long term management.   Pelham Hennick B. Bernette Mayers, MD 02/13/13 1439

## 2013-02-13 NOTE — ED Notes (Signed)
Patient states he woke up this morning and his blood sugar was 321.  States he took his insulin and his sugar only came down to 259.  States he was sick yesterday and vomited x 2.  Through the night, he was up frequently to urinate.

## 2013-02-13 NOTE — ED Notes (Signed)
Pt is unable to void. 

## 2013-02-21 NOTE — Telephone Encounter (Signed)
No additional notes

## 2013-02-26 ENCOUNTER — Encounter: Payer: Self-pay | Admitting: Family Medicine

## 2013-02-26 ENCOUNTER — Ambulatory Visit (INDEPENDENT_AMBULATORY_CARE_PROVIDER_SITE_OTHER): Payer: Self-pay | Admitting: Family Medicine

## 2013-02-26 VITALS — BP 100/72 | Temp 97.3°F | Ht 71.0 in | Wt 157.5 lb

## 2013-02-26 DIAGNOSIS — J209 Acute bronchitis, unspecified: Secondary | ICD-10-CM

## 2013-02-26 MED ORDER — SULFAMETHOXAZOLE-TRIMETHOPRIM 800-160 MG PO TABS: 1.0000 | ORAL_TABLET | Freq: Two times a day (BID) | ORAL | Status: AC

## 2013-02-26 MED ORDER — ALBUTEROL SULFATE HFA 108 (90 BASE) MCG/ACT IN AERS: 2.0000 | INHALATION_SPRAY | Freq: Four times a day (QID) | RESPIRATORY_TRACT | Status: DC | PRN

## 2013-02-26 NOTE — Progress Notes (Signed)
  Subjective:    Patient ID: John [name], male    DOB: [birth year], 22 y.o.   MRN: [id number]  Cough This is a new problem. The current episode started in the past 7 days. The problem has been gradually worsening. The problem occurs every few minutes. The cough is productive of brown sputum. Associated symptoms include chills, ear congestion and a fever. Nothing aggravates the symptoms. He has tried OTC cough suppressant for the symptoms. The treatment provided moderate relief. There is no history of asthma.      Review of Systems  Constitutional: Positive for fever and chills.  Respiratory: Positive for cough.    states he feels wheezing and tightness at times when breathing.     Objective:   Physical Exam Alert mild malaise. HEENT moderate nasal congestion. Frontal tenderness. Pharynx normal neck supple. Lungs occasional rhonchi. No wheezes currently.       Assessment & Plan:  Impression bronchitis with element of reactive airways. Plan appropriate antibiotics and inhaler proper use discussed encouraged to stop smoking WSL

## 2013-02-27 ENCOUNTER — Encounter: Payer: Self-pay | Admitting: *Deleted

## 2013-03-21 ENCOUNTER — Emergency Department (HOSPITAL_BASED_OUTPATIENT_CLINIC_OR_DEPARTMENT_OTHER): Payer: BC Managed Care – PPO

## 2013-03-21 ENCOUNTER — Encounter (HOSPITAL_BASED_OUTPATIENT_CLINIC_OR_DEPARTMENT_OTHER): Payer: Self-pay | Admitting: *Deleted

## 2013-03-21 ENCOUNTER — Other Ambulatory Visit: Payer: Self-pay

## 2013-03-21 ENCOUNTER — Emergency Department (HOSPITAL_BASED_OUTPATIENT_CLINIC_OR_DEPARTMENT_OTHER)
Admission: EM | Admit: 2013-03-21 | Discharge: 2013-03-21 | Disposition: A | Discharge status: Home / Self Care | Payer: BC Managed Care – PPO | Attending: Emergency Medicine | Admitting: Emergency Medicine

## 2013-03-21 DIAGNOSIS — Z8639 Personal history of other endocrine, nutritional and metabolic disease: Secondary | ICD-10-CM | POA: Insufficient documentation

## 2013-03-21 DIAGNOSIS — Z8659 Personal history of other mental and behavioral disorders: Secondary | ICD-10-CM | POA: Insufficient documentation

## 2013-03-21 DIAGNOSIS — R0789 Other chest pain: Secondary | ICD-10-CM | POA: Insufficient documentation

## 2013-03-21 DIAGNOSIS — Z794 Long term (current) use of insulin: Secondary | ICD-10-CM | POA: Insufficient documentation

## 2013-03-21 DIAGNOSIS — R110 Nausea: Secondary | ICD-10-CM | POA: Insufficient documentation

## 2013-03-21 DIAGNOSIS — Z862 Personal history of diseases of the blood and blood-forming organs and certain disorders involving the immune mechanism: Secondary | ICD-10-CM | POA: Insufficient documentation

## 2013-03-21 DIAGNOSIS — F17200 Nicotine dependence, unspecified, uncomplicated: Secondary | ICD-10-CM | POA: Insufficient documentation

## 2013-03-21 DIAGNOSIS — Z8709 Personal history of other diseases of the respiratory system: Secondary | ICD-10-CM | POA: Insufficient documentation

## 2013-03-21 DIAGNOSIS — E1069 Type 1 diabetes mellitus with other specified complication: Secondary | ICD-10-CM | POA: Insufficient documentation

## 2013-03-21 DIAGNOSIS — I10 Essential (primary) hypertension: Secondary | ICD-10-CM | POA: Insufficient documentation

## 2013-03-21 DIAGNOSIS — R739 Hyperglycemia, unspecified: Secondary | ICD-10-CM

## 2013-03-21 LAB — URINALYSIS, ROUTINE W REFLEX MICROSCOPIC
Bilirubin Urine: NEGATIVE
Hgb urine dipstick: NEGATIVE
Ketones, ur: 40 mg/dL — AB
Specific Gravity, Urine: 1.045 — ABNORMAL HIGH (ref 1.005–1.030)
pH: 6 (ref 5.0–8.0)

## 2013-03-21 LAB — POCT I-STAT 3, VENOUS BLOOD GAS (G3P V)
O2 Saturation: 89 %
TCO2: 25 mmol/L (ref 0–100)
pCO2, Ven: 43 mmHg — ABNORMAL LOW (ref 45.0–50.0)
pO2, Ven: 59 mmHg — ABNORMAL HIGH (ref 30.0–45.0)

## 2013-03-21 LAB — CBC WITH DIFFERENTIAL/PLATELET
Basophils Relative: 1 % (ref 0–1)
Eosinophils Relative: 2 % (ref 0–5)
HCT: 46.6 % (ref 39.0–52.0)
Hemoglobin: 17.2 g/dL — ABNORMAL HIGH (ref 13.0–17.0)
Lymphocytes Relative: 32 % (ref 12–46)
MCHC: 36.9 g/dL — ABNORMAL HIGH (ref 30.0–36.0)
MCV: 81 fL (ref 78.0–100.0)
Monocytes Absolute: 0.6 10*3/uL (ref 0.1–1.0)
Monocytes Relative: 8 % (ref 3–12)
Neutro Abs: 4.9 10*3/uL (ref 1.7–7.7)

## 2013-03-21 LAB — GLUCOSE, CAPILLARY
Glucose-Capillary: 593 mg/dL (ref 70–99)
Glucose-Capillary: 337 mg/dL — ABNORMAL HIGH (ref 70–99)
Glucose-Capillary: 247 mg/dL — ABNORMAL HIGH (ref 70–99)

## 2013-03-21 LAB — COMPREHENSIVE METABOLIC PANEL
BUN: 19 mg/dL (ref 6–23)
CO2: 24 mEq/L (ref 19–32)
Calcium: 9.8 mg/dL (ref 8.4–10.5)
Chloride: 93 mEq/L — ABNORMAL LOW (ref 96–112)
Creatinine, Ser: 0.6 mg/dL (ref 0.50–1.35)
GFR calc non Af Amer: >90 mL/min (ref >90)
Total Bilirubin: 0.6 mg/dL (ref 0.3–1.2)

## 2013-03-21 MED ORDER — SODIUM CHLORIDE 0.9 % IV SOLN
1000.0000 mL | Freq: Once | INTRAVENOUS | Status: AC
  Administered 2013-03-21: 1000 mL via INTRAVENOUS

## 2013-03-21 MED ORDER — SODIUM CHLORIDE 0.9 % IV SOLN: 1000.0000 mL | Freq: Once | INTRAVENOUS | Status: DC

## 2013-03-21 MED ORDER — SODIUM CHLORIDE 0.9 % IV BOLUS (SEPSIS)
1000.0000 mL | Freq: Once | INTRAVENOUS | Status: AC
  Administered 2013-03-21: 1000 mL via INTRAVENOUS

## 2013-03-21 NOTE — ED Notes (Signed)
Patient states his blood sugar was 590 yesterday, and he was able to get it to come back down with his regular medication.  States this morning he went to work and developed an uncomfortable felling like heaviness in his chest.  Checked his blood sugar and could not get it to register.  Blood sugar in triage is 593.

## 2013-03-21 NOTE — ED Provider Notes (Signed)
CSN: [id number]     Arrival date & time 03/21/13  0802 History     First MD Initiated Contact with Patient 03/21/13 0805     Chief Complaint  Patient presents with  . Hyperglycemia   (Consider location/radiation/quality/duration/timing/severity/associated sxs/prior Treatment) HPI   Patient presents with concerns of hyperglycemia and chest pressure. He states that he has had chest pressure for some time.  This seems largely unchanged over the past days.  The pressure is anterior, diffuse, not associated with exertion, and with no pleuritic worsening.  There is no concurrent fever, cough. Patient has been provided albuterol in the past for similar chest discomfort.  He does not have a history of asthma. In addition, patient states over the past day he has had persistent/increasing nausea as his blood sugar has remained elevated.  He notes over the past 24 hours she's had multiple blood sugar readings of greater than 500 in spite of taking his typical insulin dose.  He denies concurrent fever, chills, disorientation, confusion, vomiting, diarrhea.   Past Medical History  Diagnosis Date  . 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 Essential hypertension, benign 02/04/2011  . Goiter, unspecified 02/04/2011  . ADD (attention deficit disorder)   . Allergic rhinitis    Past Surgical History  Procedure Laterality Date  . Tonsillectomy     Family History  Problem Relation Age of Onset  . Cancer Neg Hx   . Lupus Cousin    History  Substance Use Topics  . Smoking status: Current Every Day Smoker -- 1.00 packs/day for 1 years    Types: Cigarettes  . Smokeless tobacco: Not on file  . Alcohol Use: No    Review of Systems  Constitutional:       Per HPI, otherwise negative  HENT:       Per HPI, otherwise negative  Respiratory:       Per HPI, otherwise negative  Cardiovascular:       Per HPI, otherwise negative   Gastrointestinal: Positive for nausea. Negative for vomiting.  Endocrine:       Negative aside from HPI  Genitourinary:       Neg aside from HPI   Musculoskeletal:       Per HPI, otherwise negative  Skin: Negative.   Neurological: Negative for syncope.    Allergies  Review of patient's allergies indicates no known allergies.  Home Medications   Current Outpatient Rx  Name  Route  Sig  Dispense  Refill  . albuterol (PROVENTIL HFA;VENTOLIN HFA) 108 (90 BASE) MCG/ACT inhaler   Inhalation   Inhale 2 puffs into the lungs every 6 (six) hours as needed for wheezing.   1 Inhaler   2   . glucose blood test strip   Other   1 each by Other route 2 (two) times daily. Use as instructed         . insulin aspart (NOVOLOG) 100 UNIT/ML injection   Subcutaneous   Inject into the skin 3 (three) times daily before meals.         . Insulin Isophane & Regular (HUMULIN 70/30 [place])   Subcutaneous   Inject into the skin. 35 units with breakfast, and 15 units with the evening meal          BP 131/70  Pulse 74  Temp(Src) 98.4 F (36.9 C) (Oral)  Resp 18  Wt 160 lb (72.576 kg)  BMI 22.33 kg/m2  SpO2 100% Physical Exam  Nursing note and vitals reviewed. Constitutional: He is oriented to person, place, and time. He appears well-developed. No distress.  HENT:  Head: Normocephalic and atraumatic.  Eyes: Conjunctivae and EOM are normal.  Cardiovascular: Normal rate and regular rhythm.   Pulmonary/Chest: Effort normal. No accessory muscle usage or stridor. Not tachypneic. No respiratory distress. He has decreased breath sounds.  Abdominal: He exhibits no distension.  Musculoskeletal: He exhibits no edema.  Neurological: He is alert and oriented to person, place, and time.  Skin: Skin is warm and dry.  Psychiatric: He has a normal mood and affect.    ED Course   Procedures (including critical care time)  Labs Reviewed  GLUCOSE, CAPILLARY - Abnormal; Notable for the following:     Glucose-Capillary 593 (*)    All other components within normal limits  CBC WITH DIFFERENTIAL - Abnormal; Notable for the following:    Hemoglobin 17.2 (*)    MCHC 36.9 (*)    All other components within normal limits  URINALYSIS, ROUTINE W REFLEX MICROSCOPIC - Abnormal; Notable for the following:    Specific Gravity, Urine 1.045 (*)    Glucose, UA >1000 (*)    Ketones, ur 40 (*)    All other components within normal limits  POCT I-STAT 3, BLOOD GAS (G3P V) - Abnormal; Notable for the following:    pH, Ven 7.353 (*)    pCO2, Ven 43.0 (*)    pO2, Ven 59.0 (*)    All other components within normal limits  URINE MICROSCOPIC-ADD ON  COMPREHENSIVE METABOLIC PANEL  LIPASE, BLOOD   Dg Chest 2 View  03/21/2013   *RADIOLOGY REPORT*  Clinical Data: Chest heaviness  CHEST - 2 VIEW  Comparison: 01/29/2013  Findings: Cardiomediastinal silhouette is stable.  No acute infiltrate or pleural effusion.  No pulmonary edema.  Bony thorax is unremarkable.  IMPRESSION: No active disease.   Original Report Authenticated By: Natasha Mead, M.D.    Pulse oximetry 100% room air normal Cardiac monitor has sinus rate of 70, this is normal EKG has sinus rhythm, rate 74, short PR interval, nonspecific T wave changes.  Not appreciably changed from prior, abnormal  Initial labs district ketonuria with hyperglycemia.  No diagnosis found. Update: Patient appears comfortable.  Blood glucose remains elevated.   11:45 AM Glucose now less than 250. No new complaints MDM  Young insulin-dependent male presents with hyperglycemia, chest pain.  The patient's description of persistent chest pain, the absence of ischemic changes, x-ray findings his reassuring for the very low suspicion of ACS or other acute new pathology. Patient's labs are notable for hyperglycemia, with no acidosis.  The patient improved substantially with IV fluid rehydration.  With this improvement he was appropriate for discharge with close outpatient  followup.  The patient has a physician.  In addition, the patient was counseled on the necessity to stop smoking.   Gerhard Munch, MD 03/21/13 1146

## 2013-04-15 ENCOUNTER — Ambulatory Visit (INDEPENDENT_AMBULATORY_CARE_PROVIDER_SITE_OTHER): Payer: BC Managed Care – PPO | Admitting: Endocrinology

## 2013-04-15 ENCOUNTER — Encounter: Payer: Self-pay | Admitting: Endocrinology

## 2013-04-15 VITALS — BP 122/76 | HR 90 | Ht 71.0 in | Wt 149.0 lb

## 2013-04-15 DIAGNOSIS — E1065 Type 1 diabetes mellitus with hyperglycemia: Secondary | ICD-10-CM

## 2013-04-15 NOTE — Progress Notes (Signed)
  Subjective:    Patient ID: John [name], male    DOB: 1990-11-24, 22 y.o.   MRN: [id number]  HPI Pt returns for f/u of type 1 dm (dx'ed 2008; he has mild if any neuropathy of the lower extremities. no known associated complications; he has struggled with compliance issues; he says he has never had an episode of severe hypoglycemia; he had DKA in march, 2014).  He is still hoping to get medicaid.  pt states he feels well in general, except for fatigue.  no cbg record, but states cbg's are well-controlled.  He averages a total of 30 units total per day, of prn humalog, in addition to his 70/30.   Past Medical History  Diagnosis Date  . 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 Essential hypertension, benign 02/04/2011  . Goiter, unspecified 02/04/2011  . ADD (attention deficit disorder)   . Allergic rhinitis     Past Surgical History  Procedure Laterality Date  . Tonsillectomy      History   Social History  . Marital Status: Single    Spouse Name: N/A    Number of Children: N/A  . Years of Education: N/A   Occupational History  . Not on file.   Social History Main Topics  . Smoking status: Current Every Day Smoker -- 1.00 packs/day for 1 years    Types: Cigarettes  . Smokeless tobacco: Not on file  . Alcohol Use: No  . Drug Use: No  . Sexual Activity: Not on file   Other Topics Concern  . Not on file   Social History Narrative   Regular exercise-yes    Current Outpatient Prescriptions on File Prior to Visit  Medication Sig Dispense Refill  . albuterol (PROVENTIL HFA;VENTOLIN HFA) 108 (90 BASE) MCG/ACT inhaler Inhale 2 puffs into the lungs every 6 (six) hours as needed for wheezing.  1 Inhaler  2  . glucose blood test strip 1 each by Other route 2 (two) times daily. Use as instructed      . insulin aspart (NOVOLOG) 100 UNIT/ML injection Inject into the skin 3 (three) times daily before meals.      . Insulin  Isophane & Regular (HUMULIN 70/30 [place]) Inject into the skin. 50 units with breakfast, and 20 units with the evening meal       No current facility-administered medications on file prior to visit.   No Known Allergies  Family History  Problem Relation Age of Onset  . Cancer Neg Hx   . Lupus Cousin    BP 122/76  Pulse 90  Ht 5\' 11"  (1.803 m)  Wt 149 lb (67.586 kg)  BMI 20.79 kg/m2  SpO2 98%  Review of Systems denies hypoglycemia.  He has lost weight weight.    Objective:   Physical Exam VITAL SIGNS:  See vs page GENERAL: no distress   He declines to do urine microalbumin.     Assessment & Plan:  DM: he needs a simpler regimen. Financial situation.  This limits rx of his DM. Smoker.  This exacerbates the complications of DM.

## 2013-04-15 NOTE — Patient Instructions (Addendum)
check your blood sugar twice a day.  vary the time of day when you check, between before the 3 meals, and at bedtime.  also check if you have symptoms of your blood sugar being too high or too low.  please keep a record of the readings and bring it to your next appointment here.  please call us sooner if your blood sugar goes below 70, or if you have a lot of readings over 200.  Please increase humulin 70/30, to 50 units with breakfast, and 20 with the evening meal.  This will minimize the need for humalog.  Please come back for a follow-up appointment in 2 months.   blood tests are being requested for you today.  We'll contact you with results.

## 2013-05-06 ENCOUNTER — Encounter (HOSPITAL_BASED_OUTPATIENT_CLINIC_OR_DEPARTMENT_OTHER): Payer: Self-pay | Admitting: Student

## 2013-05-06 ENCOUNTER — Emergency Department (HOSPITAL_BASED_OUTPATIENT_CLINIC_OR_DEPARTMENT_OTHER)
Admission: EM | Admit: 2013-05-06 | Discharge: 2013-05-06 | Disposition: A | Discharge status: Home / Self Care | Payer: BC Managed Care – PPO | Attending: Emergency Medicine | Admitting: Emergency Medicine

## 2013-05-06 DIAGNOSIS — Z794 Long term (current) use of insulin: Secondary | ICD-10-CM | POA: Insufficient documentation

## 2013-05-06 DIAGNOSIS — IMO0002 Reserved for concepts with insufficient information to code with codable children: Secondary | ICD-10-CM | POA: Insufficient documentation

## 2013-05-06 DIAGNOSIS — I10 Essential (primary) hypertension: Secondary | ICD-10-CM | POA: Insufficient documentation

## 2013-05-06 DIAGNOSIS — Z8659 Personal history of other mental and behavioral disorders: Secondary | ICD-10-CM | POA: Insufficient documentation

## 2013-05-06 DIAGNOSIS — Z9089 Acquired absence of other organs: Secondary | ICD-10-CM | POA: Insufficient documentation

## 2013-05-06 DIAGNOSIS — F17200 Nicotine dependence, unspecified, uncomplicated: Secondary | ICD-10-CM | POA: Insufficient documentation

## 2013-05-06 DIAGNOSIS — Z79899 Other long term (current) drug therapy: Secondary | ICD-10-CM | POA: Insufficient documentation

## 2013-05-06 DIAGNOSIS — J029 Acute pharyngitis, unspecified: Secondary | ICD-10-CM | POA: Insufficient documentation

## 2013-05-06 DIAGNOSIS — E1065 Type 1 diabetes mellitus with hyperglycemia: Secondary | ICD-10-CM | POA: Insufficient documentation

## 2013-05-06 DIAGNOSIS — J069 Acute upper respiratory infection, unspecified: Secondary | ICD-10-CM | POA: Insufficient documentation

## 2013-05-06 LAB — RAPID STREP SCREEN (MED CTR MEBANE ONLY): Streptococcus, Group A Screen (Direct): NEGATIVE

## 2013-05-06 MED ORDER — HYDROCODONE-ACETAMINOPHEN 7.5-325 MG/15ML PO SOLN
15.0000 mL | Freq: Four times a day (QID) | ORAL | Status: DC | PRN
Start: 2013-05-06 — End: 2014-03-26

## 2013-05-06 NOTE — ED Provider Notes (Signed)
CSN: [id number]     Arrival date & time 05/06/13  1533 History   This chart was scribed for Gwyneth Sprout, MD by Joaquin Music, ED Scribe. This patient was seen in room MH02/MH02 and the patient's care was started at 4:07 PM  Chief Complaint  Patient presents with  . URI   The history is provided by the patient. No language interpreter was used.   HPI Comments: John [name] is a 22 y.o. male who presents to the Emergency Department complaining of a cough, productive of phlegm over the past two days. Pt states that he has associated sore throat and chest congestion. He states he works with [employer] and complains of having a history of "bad allergies". Pt reports sick contacts with similar symptoms over the past two weeks. He denies having a history of asthma and denies the use of an inhaler. Pt has a history of T1DM, which he states is well-controlled with insulin shots. Pt denies emesis, fever, or any other symptoms. Pt is a current every day smoker of 1 pack/day.  Past Medical History  Diagnosis Date  . 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 Essential hypertension, benign 02/04/2011  . Goiter, unspecified 02/04/2011  . ADD (attention deficit disorder)   . Allergic rhinitis    Past Surgical History  Procedure Laterality Date  . Tonsillectomy     Family History  Problem Relation Age of Onset  . Cancer Neg Hx   . Lupus Cousin    History  Substance Use Topics  . Smoking status: Current Every Day Smoker -- 1.00 packs/day for 1 years    Types: Cigarettes  . Smokeless tobacco: Not on file  . Alcohol Use: No    Review of Systems  Constitutional: Negative for fever.  HENT: Positive for congestion and sore throat.   Respiratory: Positive for cough.   Gastrointestinal: Negative for vomiting.  All other systems reviewed and are negative.    Allergies  Review of patient's allergies indicates no known  allergies.  Home Medications   Current Outpatient Rx  Name  Route  Sig  Dispense  Refill  . albuterol (PROVENTIL HFA;VENTOLIN HFA) 108 (90 BASE) MCG/ACT inhaler   Inhalation   Inhale 2 puffs into the lungs every 6 (six) hours as needed for wheezing.   1 Inhaler   2   . glucose blood test strip   Other   1 each by Other route 2 (two) times daily. Use as instructed         . insulin aspart (NOVOLOG) 100 UNIT/ML injection   Subcutaneous   Inject into the skin 3 (three) times daily before meals.         . Insulin Isophane & Regular (HUMULIN 70/30 [place])   Subcutaneous   Inject into the skin. 50 units with breakfast, and 20 units with the evening meal          Triage Vitals:BP 106/59  Pulse 77  Temp(Src) 98.2 F (36.8 C) (Oral)  Resp 20  Wt 160 lb (72.576 kg)  BMI 22.33 kg/m2  SpO2 100%  Physical Exam  Nursing note and vitals reviewed. Constitutional: He is oriented to person, place, and time. He appears well-developed and well-nourished. No distress.  HENT:  Head: Normocephalic and atraumatic.  Erythema of pharynx. No tonsillar exudate, no cervical adenopathy.  Eyes: EOM are normal.  Neck: Neck supple. No tracheal deviation present.  Cardiovascular: Normal rate, regular rhythm and normal heart  sounds.   Pulmonary/Chest: Effort normal and breath sounds normal. No respiratory distress. He has no wheezes. He has no rales. He exhibits no tenderness.  Musculoskeletal: Normal range of motion.  Lymphadenopathy:    He has no cervical adenopathy.  Neurological: He is alert and oriented to person, place, and time.  Skin: Skin is warm and dry.  Psychiatric: He has a normal mood and affect. His behavior is normal.    ED Course  Procedures  DIAGNOSTIC STUDIES: Oxygen Saturation is 100% on RA, normal by my interpretation.    COORDINATION OF CARE: 4:19 PM-Discussed treatment plan which includes rapid strep test. Pt at bedside and pt agreed to plan.   Labs Review Labs  Reviewed  RAPID STREP SCREEN  CULTURE, GROUP A STREP   Imaging Review No results found.  MDM   1. URI (upper respiratory infection)     Pt with symptoms consistent with viral URI.  Well appearing here.  No signs of breathing difficulty.  Pt is type 1 DM but no sx concerning for DKA and states sugars have been running normal.  No signs of otitis or abnormal abdominal findings.  Pharynx with erythema. Rapid strep wnl and pt to return with any further problems.   I personally performed the services described in this documentation, which was scribed in my presence.  The recorded information has been reviewed and considered.    Gwyneth Sprout, MD 05/06/13 [phone]

## 2013-05-06 NOTE — ED Notes (Signed)
Pt in with c/o cold symptoms x 1 week

## 2013-05-08 LAB — CULTURE, GROUP A STREP

## 2013-05-09 ENCOUNTER — Encounter: Payer: Self-pay | Admitting: Family Medicine

## 2013-05-09 ENCOUNTER — Ambulatory Visit (INDEPENDENT_AMBULATORY_CARE_PROVIDER_SITE_OTHER): Payer: Self-pay | Admitting: Family Medicine

## 2013-05-09 VITALS — BP 112/60 | Temp 97.7°F | Ht 71.0 in | Wt 156.1 lb

## 2013-05-09 DIAGNOSIS — J209 Acute bronchitis, unspecified: Secondary | ICD-10-CM

## 2013-05-09 MED ORDER — SULFAMETHOXAZOLE-TMP DS 800-160 MG PO TABS: 1.0000 | ORAL_TABLET | Freq: Two times a day (BID) | ORAL | Status: DC

## 2013-05-09 NOTE — Progress Notes (Signed)
  Subjective:    Patient ID: John [name], male    DOB: 13-Sep-1990, 22 y.o.   MRN: [id number]  Cough This is a new problem. The current episode started in the past 7 days. The problem has been gradually worsening. The problem occurs constantly. The cough is productive of sputum. Associated symptoms include nasal congestion, shortness of breath and wheezing. Nothing aggravates the symptoms. He has tried prescription cough suppressant for the symptoms. The treatment provided no relief.  Patient was seen at [facility] on 05/06/13 for symptoms and was given prescription cough med.   Bad coughing, some productive gunk  No fever, pt smokes     Review of Systems  Respiratory: Positive for cough, shortness of breath and wheezing.    otherwise negative     Objective:   Physical Exam Alert mild malaise. HEENT normal. Lungs clear heart regular in rhythm.       Assessment & Plan:  Impression subacute bronchitis. Plan patient encouraged not to smoke. Antibiotics prescribed. Symptomatic care discussed. WSL

## 2013-06-27 ENCOUNTER — Other Ambulatory Visit: Payer: Self-pay

## 2013-11-28 ENCOUNTER — Inpatient Hospital Stay
Admission: AD | Admit: 2013-11-28 | Payer: Self-pay | Source: Other Acute Inpatient Hospital | Admitting: Internal Medicine

## 2013-12-12 ENCOUNTER — Ambulatory Visit: Payer: Self-pay | Admitting: Family Medicine

## 2014-01-08 ENCOUNTER — Emergency Department (HOSPITAL_BASED_OUTPATIENT_CLINIC_OR_DEPARTMENT_OTHER)
Admission: EM | Admit: 2014-01-08 | Discharge: 2014-01-08 | Disposition: A | Discharge status: Home / Self Care | Payer: BC Managed Care – PPO | Attending: Emergency Medicine | Admitting: Emergency Medicine

## 2014-01-08 ENCOUNTER — Emergency Department (HOSPITAL_BASED_OUTPATIENT_CLINIC_OR_DEPARTMENT_OTHER): Payer: BC Managed Care – PPO

## 2014-01-08 ENCOUNTER — Encounter (HOSPITAL_BASED_OUTPATIENT_CLINIC_OR_DEPARTMENT_OTHER): Payer: Self-pay | Admitting: Emergency Medicine

## 2014-01-08 DIAGNOSIS — M545 Low back pain, unspecified: Secondary | ICD-10-CM | POA: Insufficient documentation

## 2014-01-08 DIAGNOSIS — Z8639 Personal history of other endocrine, nutritional and metabolic disease: Secondary | ICD-10-CM | POA: Insufficient documentation

## 2014-01-08 DIAGNOSIS — Z79899 Other long term (current) drug therapy: Secondary | ICD-10-CM | POA: Insufficient documentation

## 2014-01-08 DIAGNOSIS — R0789 Other chest pain: Secondary | ICD-10-CM | POA: Insufficient documentation

## 2014-01-08 DIAGNOSIS — Z862 Personal history of diseases of the blood and blood-forming organs and certain disorders involving the immune mechanism: Secondary | ICD-10-CM | POA: Insufficient documentation

## 2014-01-08 DIAGNOSIS — E119 Type 2 diabetes mellitus without complications: Secondary | ICD-10-CM | POA: Insufficient documentation

## 2014-01-08 DIAGNOSIS — M549 Dorsalgia, unspecified: Secondary | ICD-10-CM

## 2014-01-08 DIAGNOSIS — F17200 Nicotine dependence, unspecified, uncomplicated: Secondary | ICD-10-CM | POA: Insufficient documentation

## 2014-01-08 DIAGNOSIS — Z8659 Personal history of other mental and behavioral disorders: Secondary | ICD-10-CM | POA: Insufficient documentation

## 2014-01-08 DIAGNOSIS — R739 Hyperglycemia, unspecified: Secondary | ICD-10-CM

## 2014-01-08 DIAGNOSIS — Z7982 Long term (current) use of aspirin: Secondary | ICD-10-CM | POA: Insufficient documentation

## 2014-01-08 DIAGNOSIS — Z792 Long term (current) use of antibiotics: Secondary | ICD-10-CM | POA: Insufficient documentation

## 2014-01-08 DIAGNOSIS — I10 Essential (primary) hypertension: Secondary | ICD-10-CM | POA: Insufficient documentation

## 2014-01-08 LAB — URINALYSIS, ROUTINE W REFLEX MICROSCOPIC
BILIRUBIN URINE: NEGATIVE
Glucose, UA: >1000 mg/dL — AB
HGB URINE DIPSTICK: NEGATIVE
KETONES UR: 15 mg/dL — AB
Leukocytes, UA: NEGATIVE
Nitrite: NEGATIVE
PH: 5.5 (ref 5.0–8.0)
Protein, ur: NEGATIVE mg/dL
SPECIFIC GRAVITY, URINE: 1.04 — AB (ref 1.005–1.030)
Urobilinogen, UA: 0.2 mg/dL (ref 0.0–1.0)

## 2014-01-08 LAB — COMPREHENSIVE METABOLIC PANEL
ALBUMIN: 4.3 g/dL (ref 3.5–5.2)
ALK PHOS: 56 U/L (ref 39–117)
ALT: 9 U/L (ref 0–53)
AST: 9 U/L (ref 0–37)
BILIRUBIN TOTAL: 0.9 mg/dL (ref 0.3–1.2)
BUN: 14 mg/dL (ref 6–23)
CHLORIDE: 101 meq/L (ref 96–112)
CO2: 28 mEq/L (ref 19–32)
Calcium: 9.5 mg/dL (ref 8.4–10.5)
Creatinine, Ser: 0.8 mg/dL (ref 0.50–1.35)
GFR calc Af Amer: >90 mL/min (ref >90)
GFR calc non Af Amer: >90 mL/min (ref >90)
GLUCOSE: 342 mg/dL — AB (ref 70–99)
POTASSIUM: 4.3 meq/L (ref 3.7–5.3)
SODIUM: 141 meq/L (ref 137–147)
TOTAL PROTEIN: 6.3 g/dL (ref 6.0–8.3)

## 2014-01-08 LAB — TROPONIN I

## 2014-01-08 LAB — CBC WITH DIFFERENTIAL/PLATELET
BASOS PCT: 0 % (ref 0–1)
Basophils Absolute: 0 10*3/uL (ref 0.0–0.1)
EOS ABS: 0.1 10*3/uL (ref 0.0–0.7)
Eosinophils Relative: 2 % (ref 0–5)
HCT: 44 % (ref 39.0–52.0)
HEMOGLOBIN: 16.1 g/dL (ref 13.0–17.0)
LYMPHS ABS: 2.3 10*3/uL (ref 0.7–4.0)
Lymphocytes Relative: 37 % (ref 12–46)
MCH: 30.1 pg (ref 26.0–34.0)
MCHC: 36.6 g/dL — ABNORMAL HIGH (ref 30.0–36.0)
MCV: 82.2 fL (ref 78.0–100.0)
MONOS PCT: 8 % (ref 3–12)
Monocytes Absolute: 0.5 10*3/uL (ref 0.1–1.0)
NEUTROS ABS: 3.3 10*3/uL (ref 1.7–7.7)
NEUTROS PCT: 52 % (ref 43–77)
PLATELETS: 392 10*3/uL (ref 150–400)
RBC: 5.35 MIL/uL (ref 4.22–5.81)
RDW: 12 % (ref 11.5–15.5)
WBC: 6.2 10*3/uL (ref 4.0–10.5)

## 2014-01-08 LAB — URINE MICROSCOPIC-ADD ON

## 2014-01-08 MED ORDER — HYDROCODONE-ACETAMINOPHEN 5-325 MG PO TABS
2.0000 | ORAL_TABLET | Freq: Once | ORAL | Status: AC
  Administered 2014-01-08: 2 via ORAL
  Filled 2014-01-08: qty 2

## 2014-01-08 MED ORDER — HYDROCODONE-ACETAMINOPHEN 5-325 MG PO TABS: 1.0000 | ORAL_TABLET | ORAL | Status: DC | PRN

## 2014-01-08 MED ORDER — SODIUM CHLORIDE 0.9 % IV BOLUS (SEPSIS)
1000.0000 mL | Freq: Once | INTRAVENOUS | Status: AC
  Administered 2014-01-08: 1000 mL via INTRAVENOUS

## 2014-01-08 MED ORDER — KETOROLAC TROMETHAMINE 30 MG/ML IJ SOLN
30.0000 mg | Freq: Once | INTRAMUSCULAR | Status: AC
  Administered 2014-01-08: 30 mg via INTRAVENOUS
  Filled 2014-01-08: qty 1

## 2014-01-08 NOTE — ED Provider Notes (Signed)
CSN: [id number]     Arrival date & time 01/08/14  1051 History   First MD Initiated Contact with Patient 01/08/14 1134     Chief Complaint  Patient presents with  . Back Pain     (Consider location/radiation/quality/duration/timing/severity/associated sxs/prior Treatment) HPI Comments: Patient is a 23 year old male with history of type 1 diabetes. He was admitted 3 weeks ago for ketoacidosis. He states since he was discharged he he has been having discomfort in his lower back without radiation into his legs, weakness or bowel or bladder complaints. He also started this morning with pain in the left side of his chest not associated with nausea, diaphoresis, shortness of breath, or radiation. He states he is active and exercises and denies having experienced chest pain or shortness of breath with this. He has no prior cardiac history.  Patient is a 23 y.o. male presenting with back pain. The history is provided by the patient.  Back Pain Location:  Lumbar spine Quality:  Stiffness Stiffness is present:  All day Radiates to:  Does not radiate Pain severity:  Moderate Onset quality:  Sudden Duration:  2 weeks Timing:  Constant Progression:  Unchanged Chronicity:  New   Past Medical History  Diagnosis Date  . 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 Essential hypertension, benign 02/04/2011  . Goiter, unspecified 02/04/2011  . ADD (attention deficit disorder)   . Allergic rhinitis    Past Surgical History  Procedure Laterality Date  . Tonsillectomy     Family History  Problem Relation Age of Onset  . Cancer Neg Hx   . Lupus Cousin    History  Substance Use Topics  . Smoking status: Current Every Day Smoker -- 1.00 packs/day for 1 years    Types: Cigarettes  . Smokeless tobacco: Not on file  . Alcohol Use: No    Review of Systems  Musculoskeletal: Positive for back pain.  All other systems reviewed and are  negative.     Allergies  Review of patient's allergies indicates no known allergies.  Home Medications   Prior to Admission medications   Medication Sig Start Date End Date Taking? Authorizing Provider  glucose blood test strip 1 each by Other route 2 (two) times daily. Use as instructed 01/03/13   Romero BellingSean Ellison, MD  HYDROcodone-acetaminophen (HYCET) 7.5-325 mg/15 ml solution Take 15 mLs by mouth 4 (four) times daily as needed for pain or cough. 05/06/13 05/06/14  Gwyneth SproutWhitney Plunkett, MD  insulin aspart (NOVOLOG) 100 UNIT/ML injection Inject into the skin 3 (three) times daily before meals.    Historical Provider, MD  Insulin Isophane & Regular (HUMULIN 70/30 [place]) Inject into the skin. 50 units with breakfast, and 20 units with the evening meal    Historical Provider, MD  sulfamethoxazole-trimethoprim (BACTRIM DS) 800-160 MG per tablet Take 1 tablet by mouth 2 (two) times daily. 05/09/13   Merlyn AlbertWilliam S Luking, MD   BP 134/85  Pulse 87  Temp(Src) 98.7 F (37.1 C) (Oral)  Resp 18  Ht 5\' 9"  (1.753 m)  Wt 160 lb (72.576 kg)  BMI 23.62 kg/m2  SpO2 99% Physical Exam  Nursing note and vitals reviewed. Constitutional: He is oriented to person, place, and time. He appears well-developed and well-nourished. No distress.  HENT:  Head: Normocephalic and atraumatic.  Mouth/Throat: Oropharynx is clear and moist.  Neck: Normal range of motion. Neck supple.  Cardiovascular: Normal rate, regular rhythm and normal heart sounds.   No murmur  heard. Pulmonary/Chest: Effort normal and breath sounds normal. No respiratory distress.  Abdominal: Soft. Bowel sounds are normal. He exhibits no distension and no mass. There is no tenderness.  Musculoskeletal: Normal range of motion. He exhibits no edema.  Lymphadenopathy:    He has no cervical adenopathy.  Neurological: He is alert and oriented to person, place, and time.  Skin: Skin is warm and dry. He is not diaphoretic.    ED Course  Procedures (including  critical care time) Labs Review Labs Reviewed  CBC WITH DIFFERENTIAL  COMPREHENSIVE METABOLIC PANEL  TROPONIN I  URINALYSIS, ROUTINE W REFLEX MICROSCOPIC    Imaging Review No results found.   EKG Interpretation   Date/Time:  Wednesday Jan 08 2014 10:57:28 EDT Ventricular Rate:  89 PR Interval:  110 QRS Duration: 84 QT Interval:  362 QTC Calculation: 440 R Axis:   90 Text Interpretation:  Sinus rhythm with short PR Rightward axis Borderline  ECG No significant change since 03/21/13 Confirmed by DELOS  MD, Alaylah Heatherington  [phone]([id number]) on 01/08/2014 11:47:15 AM      MDM   Final diagnoses:  None    Patient is a 23 year old male with insulin-dependent diabetes periods recently hospitalized for DKA. He presents today with complaints of pain in his back and chest that has been present since his hospitalization. He denies any shortness of breath and there is nothing in the workup indicates a cardiac etiology. His EKG and troponin are normal. Workup reveals hyperglycemia, however no ketoacidosis. I feel as though he is appropriate for discharge. He is to followup with his primary Dr. if not improving in the next week. He is requesting something for his pain. I've agreed to write a small quantity of hydrocodone.    Geoffery Lyonsouglas Desarea Ohagan, MD 01/08/14 [phone]

## 2014-01-08 NOTE — ED Notes (Signed)
Pt amb to room 6 with quick steady gait in nad. Pt reports several weeks of low back pain, and left sided chest "soreness and aching" onset this am. ekg in progress while pt being triaged, pt states "It comes and it goes.John [name].John [name]."

## 2014-01-08 NOTE — Discharge Instructions (Signed)
Hydrocodone as prescribed as needed for pain.  Followup with your primary Dr. if not improving in the next several days, and return to the ER if your symptoms substantially worsen or change.   Back Pain, Adult Low back pain is very common. About 1 in 5 people have back pain.The cause of low back pain is rarely dangerous. The pain often gets better over time.About half of people with a sudden onset of back pain feel better in just 2 weeks. About 8 in 10 people feel better by 6 weeks.  CAUSES Some common causes of back pain include:  Strain of the muscles or ligaments supporting the spine.  Wear and tear (degeneration) of the spinal discs.  Arthritis.  Direct injury to the back. DIAGNOSIS Most of the time, the direct cause of low back pain is not known.However, back pain can be treated effectively even when the exact cause of the pain is unknown.Answering your caregiver's questions about your overall health and symptoms is one of the most accurate ways to make sure the cause of your pain is not dangerous. If your caregiver needs more information, he or she may order lab work or imaging tests (X-rays or MRIs).However, even if imaging tests show changes in your back, this usually does not require surgery. HOME CARE INSTRUCTIONS For many people, back pain returns.Since low back pain is rarely dangerous, it is often a condition that people can learn to [facility] their own.   Remain active. It is stressful on the back to sit or stand in one place. Do not sit, drive, or stand in one place for more than 30 minutes at a time. Take short walks on level surfaces as soon as pain allows.Try to increase the length of time you walk each day.  Do not stay in bed.Resting more than 1 or 2 days can delay your recovery.  Do not avoid exercise or work.Your body is made to move.It is not dangerous to be active, even though your back may hurt.Your back will likely heal faster if you return to being  active before your pain is gone.  Pay attention to your body when you bend and lift. Many people have less discomfortwhen lifting if they bend their knees, keep the load close to their bodies,and avoid twisting. Often, the most comfortable positions are those that put less stress on your recovering back.  Find a comfortable position to sleep. Use a firm mattress and lie on your side with your knees slightly bent. If you lie on your back, put a pillow under your knees.  Only take over-the-counter or prescription medicines as directed by your caregiver. Over-the-counter medicines to reduce pain and inflammation are often the most helpful.Your caregiver may prescribe muscle relaxant drugs.These medicines help dull your pain so you can more quickly return to your normal activities and healthy exercise.  Put ice on the injured area.  Put ice in a plastic bag.  Place a towel between your skin and the bag.  Leave the ice on for 15-20 minutes, 03-04 times a day for the first 2 to 3 days. After that, ice and heat may be alternated to reduce pain and spasms.  Ask your caregiver about trying back exercises and gentle massage. This may be of some benefit.  Avoid feeling anxious or stressed.Stress increases muscle tension and can worsen back pain.It is important to recognize when you are anxious or stressed and learn ways to manage it.Exercise is a great option. SEEK MEDICAL CARE IF:  You have pain that is not relieved with rest or medicine.  You have pain that does not improve in 1 week.  You have new symptoms.  You are generally not feeling well. SEEK IMMEDIATE MEDICAL CARE IF:   You have pain that radiates from your back into your legs.  You develop new bowel or bladder control problems.  You have unusual weakness or numbness in your arms or legs.  You develop nausea or vomiting.  You develop abdominal pain.  You feel faint. Document Released: 08/08/2005 Document Revised:  02/07/2012 Document Reviewed: 12/27/2010 [facility] Patient Information 2014 [place], [place].  Chest Pain (Nonspecific) It is often hard to give a specific diagnosis for the cause of chest pain. There is always a chance that your pain could be related to something serious, such as a heart attack or a blood clot in the lungs. You need to follow up with your caregiver for further evaluation. CAUSES   Heartburn.  Pneumonia or bronchitis.  Anxiety or stress.  Inflammation around your heart (pericarditis) or lung (pleuritis or pleurisy).  A blood clot in the lung.  A collapsed lung (pneumothorax). It can develop suddenly on its own (spontaneous pneumothorax) or from injury (trauma) to the chest.  Shingles infection (herpes zoster virus). The chest wall is composed of bones, muscles, and cartilage. Any of these can be the source of the pain.  The bones can be bruised by injury.  The muscles or cartilage can be strained by coughing or overwork.  The cartilage can be affected by inflammation and become sore (costochondritis). DIAGNOSIS  Lab tests or other studies, such as X-rays, electrocardiography, stress testing, or cardiac imaging, may be needed to find the cause of your pain.  TREATMENT   Treatment depends on what may be causing your chest pain. Treatment may include:  Acid blockers for heartburn.  Anti-inflammatory medicine.  Pain medicine for inflammatory conditions.  Antibiotics if an infection is present.  You may be advised to change lifestyle habits. This includes stopping smoking and avoiding alcohol, caffeine, and chocolate.  You may be advised to keep your head raised (elevated) when sleeping. This reduces the chance of acid going backward from your stomach into your esophagus.  Most of the time, nonspecific chest pain will improve within 2 to 3 days with rest and mild pain medicine. HOME CARE INSTRUCTIONS   If antibiotics were prescribed, take your antibiotics as  directed. Finish them even if you start to feel better.  For the next few days, avoid physical activities that bring on chest pain. Continue physical activities as directed.  Do not smoke.  Avoid drinking alcohol.  Only take over-the-counter or prescription medicine for pain, discomfort, or fever as directed by your caregiver.  Follow your caregiver's suggestions for further testing if your chest pain does not go away.  Keep any follow-up appointments you made. If you do not go to an appointment, you could develop lasting (chronic) problems with pain. If there is any problem keeping an appointment, you must call to reschedule. SEEK MEDICAL CARE IF:   You think you are having problems from the medicine you are taking. Read your medicine instructions carefully.  Your chest pain does not go away, even after treatment.  You develop a rash with blisters on your chest. SEEK IMMEDIATE MEDICAL CARE IF:   You have increased chest pain or pain that spreads to your arm, neck, jaw, back, or abdomen.  You develop shortness of breath, an increasing cough, or you are  coughing up blood.  You have severe back or abdominal pain, feel nauseous, or vomit.  You develop severe weakness, fainting, or chills.  You have a fever. THIS IS AN EMERGENCY. Do not wait to see if the pain will go away. Get medical help at once. Call your local emergency services (911 in [place] Do not drive yourself to the hospital. MAKE SURE YOU:   Understand these instructions.  Will watch your condition.  Will get help right away if you are not doing well or get worse. Document Released: 05/18/2005 Document Revised: 10/31/2011 Document Reviewed: 03/13/2008 [facility]ExitCare Patient Information 2014 [place]Exit[place], [place].

## 2014-01-09 IMAGING — CR DG CHEST 2V
2 series · 2 of 2 positions shown · non-contrast
Comparison: August 28, 2012

CLINICAL DATA: Chest pain

CHEST - 2 VIEW

[w chest pa]
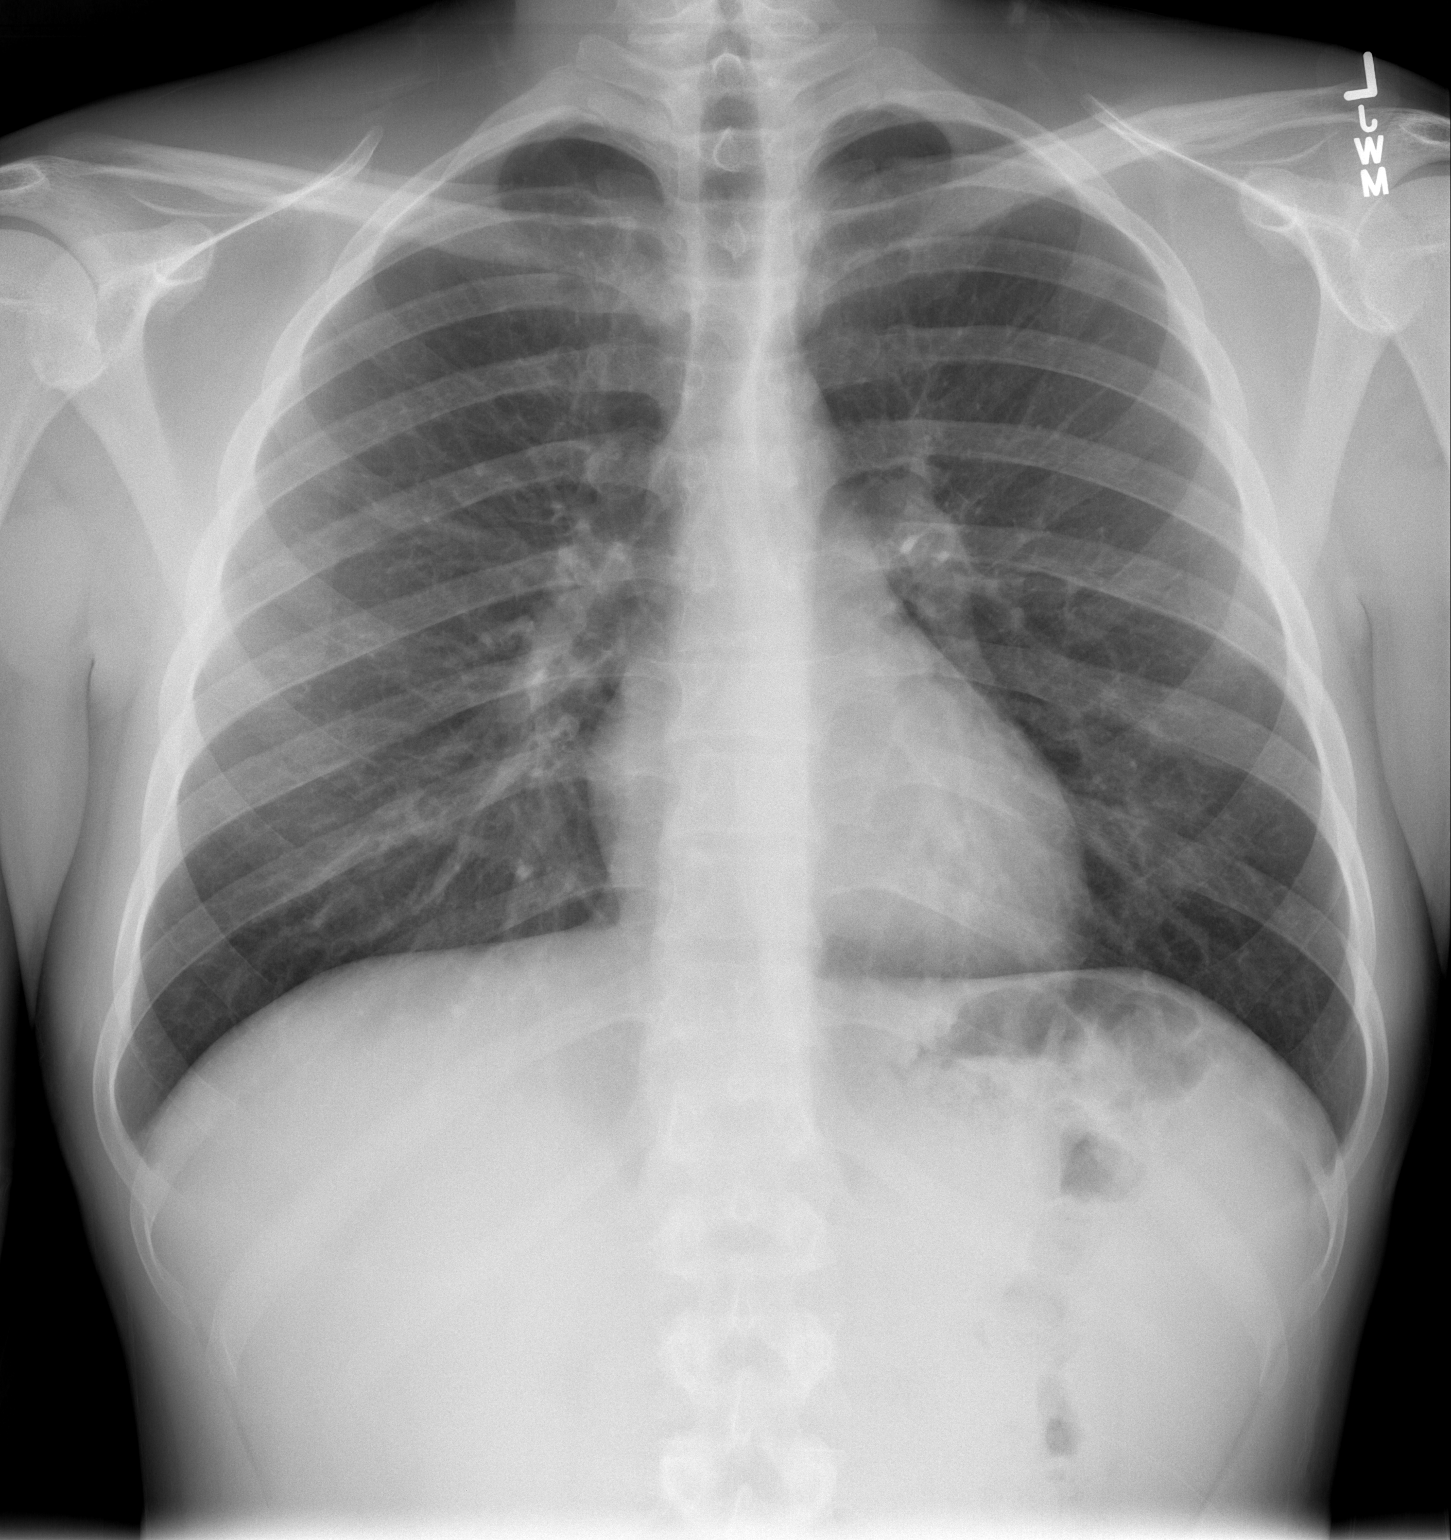

[w chest lat]
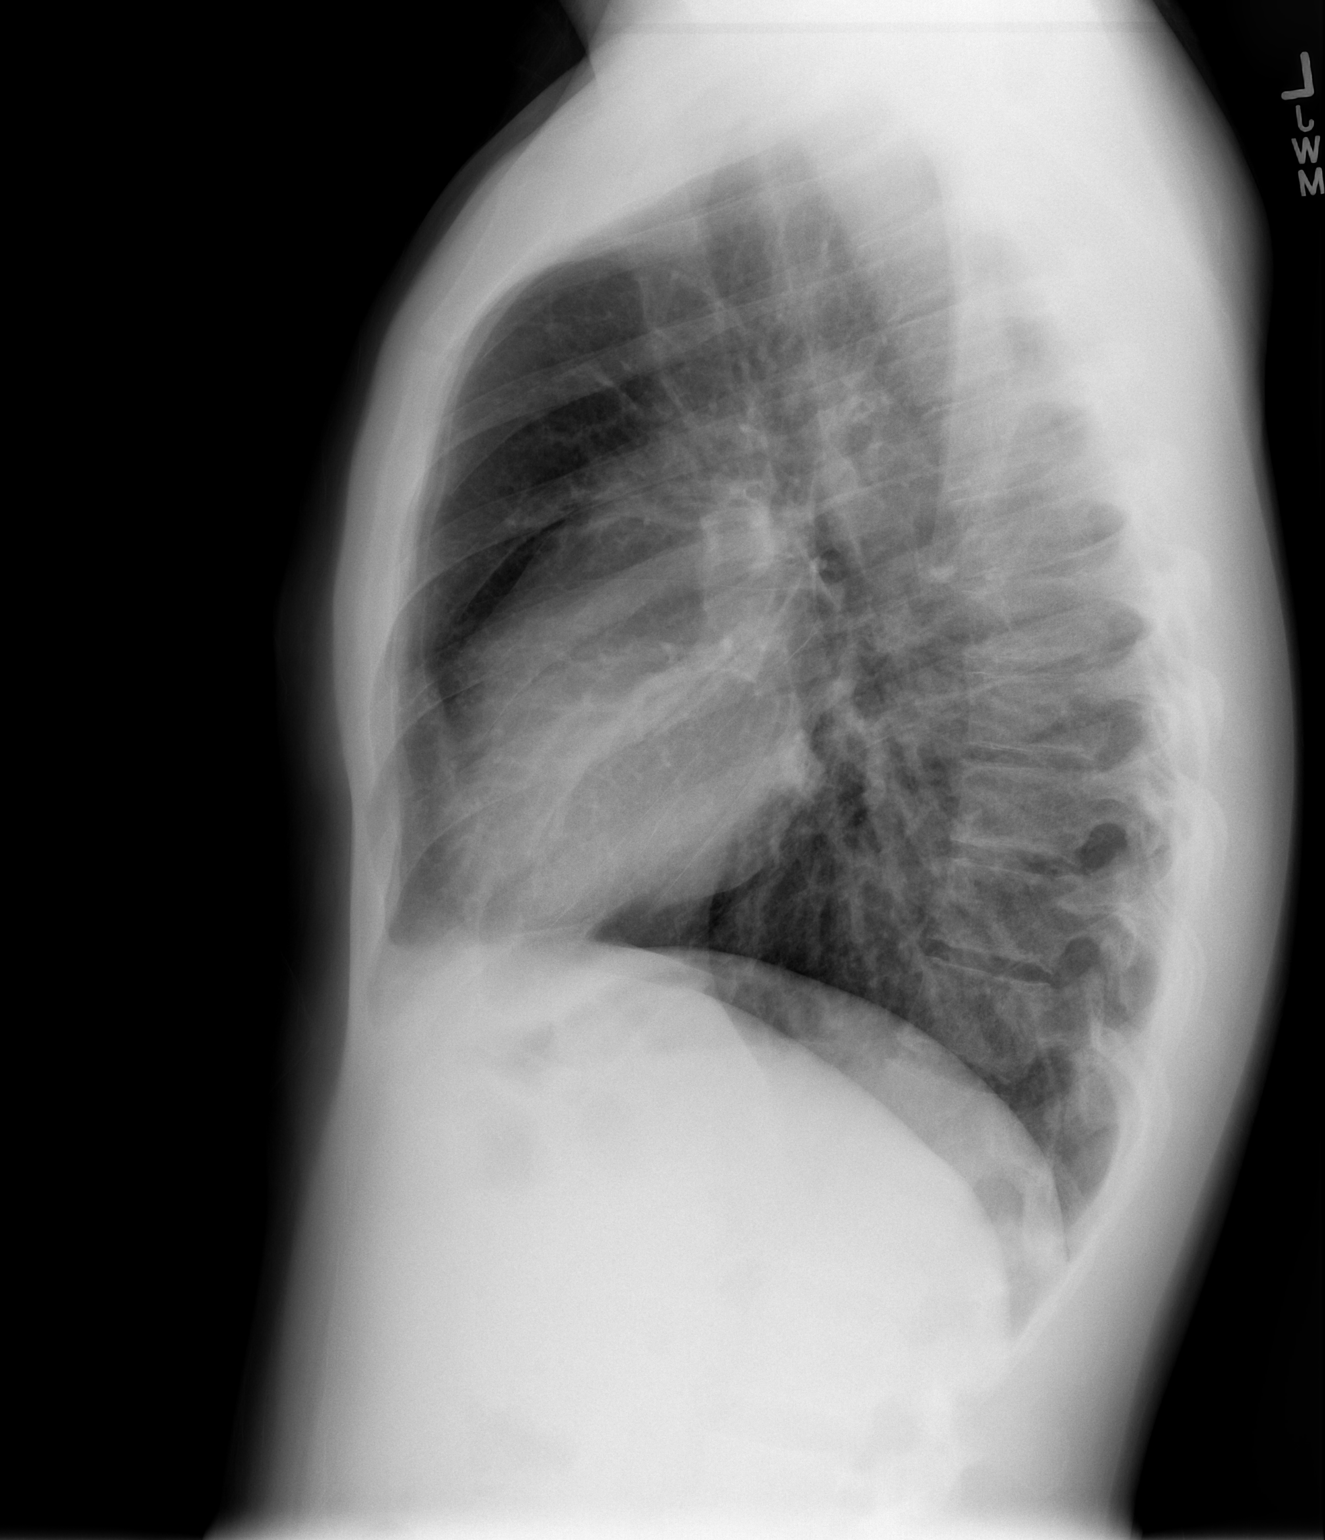

[2 of 2 positions shown; findings below may reference images not displayed]

FINDINGS: Lungs clear.  Heart size and pulmonary vascularity are
normal.  No adenopathy.  No bone lesions.  No pneumothorax.
IMPRESSION: No abnormality noted.

## 2014-01-21 ENCOUNTER — Encounter: Payer: Self-pay | Admitting: Endocrinology

## 2014-01-21 ENCOUNTER — Ambulatory Visit (INDEPENDENT_AMBULATORY_CARE_PROVIDER_SITE_OTHER): Payer: BC Managed Care – PPO | Admitting: Endocrinology

## 2014-01-21 VITALS — BP 112/74 | HR 91 | Temp 97.7°F | Ht 71.0 in | Wt 152.0 lb

## 2014-01-21 DIAGNOSIS — IMO0002 Reserved for concepts with insufficient information to code with codable children: Secondary | ICD-10-CM

## 2014-01-21 DIAGNOSIS — E1065 Type 1 diabetes mellitus with hyperglycemia: Secondary | ICD-10-CM

## 2014-01-21 LAB — HEMOGLOBIN A1C: HEMOGLOBIN A1C: 9.3 % — AB (ref 4.6–6.5)

## 2014-01-21 NOTE — Progress Notes (Signed)
Subjective:    Patient ID: John [name], male    DOB: 11-11-1990, 23 y.o.   MRN: [id number]  HPI Pt returns for f/u of type 1 dm (dx'ed 2008; he has mild if any neuropathy of the lower extremities. no known associated complications; he has struggled with compliance issues, so he takes a simple bid insulin schedule; he says he has never had an episode of pancreatitis; severe hypoglycemia; he had DKA in march, 2014, and again in April of 2015 ([facility] [place])).  2 days ago, cbg went up to the 400's.  He took novolog from his mother's supply.  Then cbg went down to 36.  He says he is uncertain why it was high.  no cbg record, but states cbg's are much better since hospitalization for DKA.  He also says he is more compliant with taking his insulin since the hospital visit for DKA.   Past Medical History  Diagnosis Date  . 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 Essential hypertension, benign 02/04/2011  . Goiter, unspecified 02/04/2011  . ADD (attention deficit disorder)   . Allergic rhinitis     Past Surgical History  Procedure Laterality Date  . Tonsillectomy      History   Social History  . Marital Status: Single    Spouse Name: N/A    Number of Children: N/A  . Years of Education: N/A   Occupational History  . Not on file.   Social History Main Topics  . Smoking status: Current Every Day Smoker -- 1.00 packs/day for 1 years    Types: Cigarettes  . Smokeless tobacco: Not on file  . Alcohol Use: No  . Drug Use: No  . Sexual Activity: Not on file   Other Topics Concern  . Not on file   Social History Narrative   Regular exercise-yes    Current Outpatient Prescriptions on File Prior to Visit  Medication Sig Dispense Refill  . glucose blood test strip 1 each by Other route 2 (two) times daily. Use as instructed      . HYDROcodone-acetaminophen (HYCET) 7.5-325 mg/15 ml solution Take 15 mLs by mouth 4 (four)  times daily as needed for pain or cough.  120 mL  0  . HYDROcodone-acetaminophen (NORCO) 5-325 MG per tablet Take 1-2 tablets by mouth every 4 (four) hours as needed.  12 tablet  0  . Insulin Isophane & Regular (HUMULIN 70/30 [place]) Inject into the skin. 55 units with breakfast, and 25 units with the evening meal      . sulfamethoxazole-trimethoprim (BACTRIM DS) 800-160 MG per tablet Take 1 tablet by mouth 2 (two) times daily.  20 tablet  0   No current facility-administered medications on file prior to visit.    No Known Allergies  Family History  Problem Relation Age of Onset  . Cancer Neg Hx   . Lupus Cousin     BP 112/74  Pulse 91  Temp(Src) 97.7 F (36.5 C) (Oral)  Ht 5\' 11"  (1.803 m)  Wt 152 lb (68.947 kg)  BMI 21.21 kg/m2  SpO2 98%  Review of Systems Denies n/v/LOC    Objective:   Physical Exam Pulses: dorsalis pedis intact bilat.   Feet: no deformity. normal color and temp.  no edema Skin:  no ulcer on the feet.   Neuro: sensation is intact to touch on the feet.     Lab Results  Component Value Date   HGBA1C 9.3*  01/21/2014       Assessment & Plan:  DM: severe exacerbation. Noncompliance with cbg recording, insulin dosing, and f/u ov's: I'll work around this as best I can. DKA: much better: because of this, he needs a simple regimen, and achievable goals.      Patient is advised the following: Patient Instructions  check your blood sugar twice a day.  vary the time of day when you check, between before the 3 meals, and at bedtime.  also check if you have symptoms of your blood sugar being too high or too low.  please keep a record of the readings and bring it to your next appointment here.  please call us sooner if your blood sugar goes below 70, or if you have a lot of readings over 200.   Please increase humulin 70/30, to 55 units with breakfast, and 25 with the evening meal.   Please come back for a follow-up appointment in 3 months.   blood tests are being  requested for you today.  We'll contact you with results.

## 2014-01-21 NOTE — Patient Instructions (Addendum)
check your blood sugar twice a day.  vary the time of day when you check, between before the 3 meals, and at bedtime.  also check if you have symptoms of your blood sugar being too high or too low.  please keep a record of the readings and bring it to your next appointment here.  please call us sooner if your blood sugar goes below 70, or if you have a lot of readings over 200.   Please increase humulin 70/30, to 55 units with breakfast, and 25 with the evening meal.   Please come back for a follow-up appointment in 3 months.   blood tests are being requested for you today.  We'll contact you with results.

## 2014-03-19 ENCOUNTER — Telehealth: Payer: Self-pay

## 2014-03-19 NOTE — Telephone Encounter (Signed)
Diabetic bundle. Called pt to try and get appointment set up with Dr. Everardo AllEllison. 3 numbers on file are incorrect. Unable to reach pt or leave vm.

## 2014-03-26 ENCOUNTER — Ambulatory Visit (INDEPENDENT_AMBULATORY_CARE_PROVIDER_SITE_OTHER): Payer: Self-pay | Admitting: Family Medicine

## 2014-03-26 ENCOUNTER — Encounter: Payer: Self-pay | Admitting: Family Medicine

## 2014-03-26 VITALS — BP 138/88 | Ht 70.0 in | Wt 146.0 lb

## 2014-03-26 DIAGNOSIS — G43109 Migraine with aura, not intractable, without status migrainosus: Secondary | ICD-10-CM

## 2014-03-26 DIAGNOSIS — G43909 Migraine, unspecified, not intractable, without status migrainosus: Secondary | ICD-10-CM | POA: Insufficient documentation

## 2014-03-26 MED ORDER — SUMATRIPTAN SUCCINATE 50 MG PO TABS: 50.0000 mg | ORAL_TABLET | Freq: Once | ORAL | Status: DC

## 2014-03-26 MED ORDER — NAPROXEN SODIUM 550 MG PO TABS: 550.0000 mg | ORAL_TABLET | Freq: Two times a day (BID) | ORAL | Status: DC

## 2014-03-26 NOTE — Progress Notes (Signed)
   Subjective:    Patient ID: John [name], male    DOB: Dec 30, 1990, 23 y.o.   MRN: [id number]  Headache  This is a new problem. The current episode started in the past 7 days. Associated symptoms include abdominal pain. Associated symptoms comments: diarrhea. Treatments tried: ibuprofen. The treatment provided no relief.    Light makes the headaches worse  Usually gets normal heades that are normal  No runny nose or cough r cong  Feels like migr headche spt got in the past  Worse on the left throbbing at times No sig change in stress level lately  Stomach queazzy,  Diagnosis of migraine in the past  Review of Systems  Gastrointestinal: Positive for abdominal pain.  Neurological: Positive for headaches.       Objective:   Physical Exam  Alert slight malaise next upper. Vital stable HEENT normal. Lungs clear. Heart rare in rhythm. Abdomen benign.      Assessment & Plan:  Impression migraine headache. #2 gastroenteritis potentially related to #1 plan Imitrex when necessary. Anaprox DS when necessary. Symptomatic care discussed. WSL

## 2014-07-07 ENCOUNTER — Emergency Department (HOSPITAL_BASED_OUTPATIENT_CLINIC_OR_DEPARTMENT_OTHER): Payer: BC Managed Care – PPO

## 2014-07-07 ENCOUNTER — Inpatient Hospital Stay (HOSPITAL_BASED_OUTPATIENT_CLINIC_OR_DEPARTMENT_OTHER)
Admission: EM | Admit: 2014-07-07 | Discharge: 2014-07-10 | DRG: 639 | Disposition: A | Discharge status: Home / Self Care | Payer: BC Managed Care – PPO | Attending: Internal Medicine | Admitting: Internal Medicine

## 2014-07-07 ENCOUNTER — Encounter (HOSPITAL_BASED_OUTPATIENT_CLINIC_OR_DEPARTMENT_OTHER): Payer: Self-pay | Admitting: *Deleted

## 2014-07-07 DIAGNOSIS — F988 Other specified behavioral and emotional disorders with onset usually occurring in childhood and adolescence: Secondary | ICD-10-CM | POA: Diagnosis present

## 2014-07-07 DIAGNOSIS — I10 Essential (primary) hypertension: Secondary | ICD-10-CM | POA: Diagnosis present

## 2014-07-07 DIAGNOSIS — E049 Nontoxic goiter, unspecified: Secondary | ICD-10-CM | POA: Diagnosis present

## 2014-07-07 DIAGNOSIS — Z720 Tobacco use: Secondary | ICD-10-CM | POA: Diagnosis present

## 2014-07-07 DIAGNOSIS — E1065 Type 1 diabetes mellitus with hyperglycemia: Secondary | ICD-10-CM | POA: Diagnosis present

## 2014-07-07 DIAGNOSIS — D72829 Elevated white blood cell count, unspecified: Secondary | ICD-10-CM | POA: Diagnosis present

## 2014-07-07 DIAGNOSIS — J309 Allergic rhinitis, unspecified: Secondary | ICD-10-CM | POA: Diagnosis present

## 2014-07-07 DIAGNOSIS — R112 Nausea with vomiting, unspecified: Secondary | ICD-10-CM | POA: Diagnosis present

## 2014-07-07 DIAGNOSIS — R52 Pain, unspecified: Secondary | ICD-10-CM

## 2014-07-07 DIAGNOSIS — E1010 Type 1 diabetes mellitus with ketoacidosis without coma: Secondary | ICD-10-CM | POA: Diagnosis present

## 2014-07-07 DIAGNOSIS — E860 Dehydration: Secondary | ICD-10-CM

## 2014-07-07 DIAGNOSIS — G629 Polyneuropathy, unspecified: Secondary | ICD-10-CM | POA: Diagnosis present

## 2014-07-07 DIAGNOSIS — F17210 Nicotine dependence, cigarettes, uncomplicated: Secondary | ICD-10-CM | POA: Diagnosis present

## 2014-07-07 LAB — CBC WITH DIFFERENTIAL/PLATELET
BASOS ABS: 0.2 10*3/uL — AB (ref 0.0–0.1)
BASOS PCT: 1 % (ref 0–1)
Band Neutrophils: 1 % (ref 0–10)
Eosinophils Absolute: 0 10*3/uL (ref 0.0–0.7)
Eosinophils Relative: 0 % (ref 0–5)
HCT: 50.6 % (ref 39.0–52.0)
HEMOGLOBIN: 17.7 g/dL — AB (ref 13.0–17.0)
LYMPHS ABS: 1.1 10*3/uL (ref 0.7–4.0)
LYMPHS PCT: 5 % — AB (ref 12–46)
MCH: 28.8 pg (ref 26.0–34.0)
MCHC: 35 g/dL (ref 30.0–36.0)
MCV: 82.4 fL (ref 78.0–100.0)
MONO ABS: 0.4 10*3/uL (ref 0.1–1.0)
Monocytes Relative: 2 % — ABNORMAL LOW (ref 3–12)
NEUTROS PCT: 91 % — AB (ref 43–77)
Neutro Abs: 20.1 10*3/uL — ABNORMAL HIGH (ref 1.7–7.7)
PLATELETS: 615 10*3/uL — AB (ref 150–400)
RBC: 6.14 MIL/uL — ABNORMAL HIGH (ref 4.22–5.81)
RDW: 12.6 % (ref 11.5–15.5)
WBC: 21.8 10*3/uL — ABNORMAL HIGH (ref 4.0–10.5)

## 2014-07-07 LAB — GLUCOSE, CAPILLARY
GLUCOSE-CAPILLARY: 224 mg/dL — AB (ref 70–99)
GLUCOSE-CAPILLARY: 232 mg/dL — AB (ref 70–99)
GLUCOSE-CAPILLARY: 166 mg/dL — AB (ref 70–99)
Glucose-Capillary: 281 mg/dL — ABNORMAL HIGH (ref 70–99)
Glucose-Capillary: 136 mg/dL — ABNORMAL HIGH (ref 70–99)
Glucose-Capillary: 150 mg/dL — ABNORMAL HIGH (ref 70–99)

## 2014-07-07 LAB — BASIC METABOLIC PANEL
ANION GAP: 32 — AB (ref 5–15)
Anion gap: 21 — ABNORMAL HIGH (ref 5–15)
BUN: 27 mg/dL — ABNORMAL HIGH (ref 6–23)
BUN: 18 mg/dL (ref 6–23)
CHLORIDE: 90 meq/L — AB (ref 96–112)
CHLORIDE: 102 meq/L (ref 96–112)
CO2: 14 mEq/L — ABNORMAL LOW (ref 19–32)
CO2: 14 mEq/L — ABNORMAL LOW (ref 19–32)
Calcium: 10.2 mg/dL (ref 8.4–10.5)
Calcium: 8.7 mg/dL (ref 8.4–10.5)
Creatinine, Ser: 0.9 mg/dL (ref 0.50–1.35)
Creatinine, Ser: 0.81 mg/dL (ref 0.50–1.35)
GFR calc Af Amer: >90 mL/min (ref >90)
GFR calc non Af Amer: >90 mL/min (ref >90)
GFR calc non Af Amer: >90 mL/min (ref >90)
GLUCOSE: 224 mg/dL — AB (ref 70–99)
Glucose, Bld: 458 mg/dL — ABNORMAL HIGH (ref 70–99)
POTASSIUM: 4.8 meq/L (ref 3.7–5.3)
Potassium: 5.2 mEq/L (ref 3.7–5.3)
Sodium: 136 mEq/L — ABNORMAL LOW (ref 137–147)
Sodium: 137 mEq/L (ref 137–147)

## 2014-07-07 LAB — URINALYSIS, ROUTINE W REFLEX MICROSCOPIC
BILIRUBIN URINE: NEGATIVE
Glucose, UA: >1000 mg/dL — AB
HGB URINE DIPSTICK: NEGATIVE
Ketones, ur: >80 mg/dL — AB
Leukocytes, UA: NEGATIVE
Nitrite: NEGATIVE
Protein, ur: NEGATIVE mg/dL
SPECIFIC GRAVITY, URINE: 1.031 — AB (ref 1.005–1.030)
Urobilinogen, UA: 0.2 mg/dL (ref 0.0–1.0)
pH: 5.5 (ref 5.0–8.0)

## 2014-07-07 LAB — I-STAT VENOUS BLOOD GAS, ED
ACID-BASE DEFICIT: 13 mmol/L — AB (ref 0.0–2.0)
Bicarbonate: 14 mEq/L — ABNORMAL LOW (ref 20.0–24.0)
O2 Saturation: 75 %
PCO2 VEN: 35.2 mmHg — AB (ref 45.0–50.0)
Patient temperature: 98.2
TCO2: 15 mmol/L (ref 0–100)
pH, Ven: 7.208 — ABNORMAL LOW (ref 7.250–7.300)
pO2, Ven: 47 mmHg — ABNORMAL HIGH (ref 30.0–45.0)

## 2014-07-07 LAB — CBG MONITORING, ED
GLUCOSE-CAPILLARY: 359 mg/dL — AB (ref 70–99)
Glucose-Capillary: 343 mg/dL — ABNORMAL HIGH (ref 70–99)
Glucose-Capillary: 257 mg/dL — ABNORMAL HIGH (ref 70–99)

## 2014-07-07 LAB — URINE MICROSCOPIC-ADD ON

## 2014-07-07 LAB — MRSA PCR SCREENING: MRSA BY PCR: NEGATIVE

## 2014-07-07 MED ORDER — SODIUM CHLORIDE 0.9 % IV SOLN
INTRAVENOUS | Status: DC
  Administered 2014-07-07: 2.8 [IU]/h via INTRAVENOUS

## 2014-07-07 MED ORDER — SODIUM CHLORIDE 0.9 % IV SOLN
INTRAVENOUS | Status: DC
  Administered 2014-07-07 – 2014-07-09 (×3): via INTRAVENOUS

## 2014-07-07 MED ORDER — SODIUM CHLORIDE 0.9 % IV BOLUS (SEPSIS)
1000.0000 mL | Freq: Once | INTRAVENOUS | Status: AC
  Administered 2014-07-07: 1000 mL via INTRAVENOUS

## 2014-07-07 MED ORDER — DEXTROSE-NACL 5-0.45 % IV SOLN
INTRAVENOUS | Status: DC
  Administered 2014-07-07: 22:00:00 via INTRAVENOUS

## 2014-07-07 MED ORDER — HEPARIN SODIUM (PORCINE) 5000 UNIT/ML IJ SOLN
5000.0000 [IU] | Freq: Three times a day (TID) | INTRAMUSCULAR | Status: DC
  Administered 2014-07-07 – 2014-07-10 (×8): 5000 [IU] via SUBCUTANEOUS
  Filled 2014-07-07 (×11): qty 1

## 2014-07-07 MED ORDER — DEXTROSE 50 % IV SOLN: 25.0000 mL | INTRAVENOUS | Status: DC | PRN

## 2014-07-07 MED ORDER — INSULIN REGULAR HUMAN 100 UNIT/ML IJ SOLN: INTRAMUSCULAR | Status: DC

## 2014-07-07 MED ORDER — INFLUENZA VAC SPLIT QUAD 0.5 ML IM SUSY
0.5000 mL | PREFILLED_SYRINGE | INTRAMUSCULAR | Status: DC
  Filled 2014-07-07: qty 0.5

## 2014-07-07 MED ORDER — CYCLOBENZAPRINE HCL 5 MG PO TABS
5.0000 mg | ORAL_TABLET | Freq: Three times a day (TID) | ORAL | Status: DC | PRN
  Administered 2014-07-08 – 2014-07-09 (×2): 5 mg via ORAL
  Filled 2014-07-07 (×2): qty 1

## 2014-07-07 MED ORDER — MORPHINE SULFATE 4 MG/ML IJ SOLN
4.0000 mg | Freq: Once | INTRAMUSCULAR | Status: AC
Start: 2014-07-07 — End: 2014-07-07
  Administered 2014-07-07: 4 mg via INTRAVENOUS
  Filled 2014-07-07: qty 1

## 2014-07-07 MED ORDER — ACETAMINOPHEN 500 MG PO TABS
500.0000 mg | ORAL_TABLET | Freq: Four times a day (QID) | ORAL | Status: DC | PRN
  Administered 2014-07-08 – 2014-07-09 (×2): 500 mg via ORAL
  Filled 2014-07-07 (×2): qty 1

## 2014-07-07 MED ORDER — MORPHINE SULFATE 2 MG/ML IJ SOLN
2.0000 mg | INTRAMUSCULAR | Status: DC | PRN
  Administered 2014-07-07 – 2014-07-09 (×6): 2 mg via INTRAVENOUS
  Filled 2014-07-07 (×6): qty 1

## 2014-07-07 NOTE — ED Notes (Signed)
MD back at bedside.

## 2014-07-07 NOTE — H&P (Addendum)
[facility] History and Physical  John [name] DOB: [birth year] DOA: 07/07/2014  Referring physician: EDP PCP: Harlow Asa, MD   Chief Complaint: vomiting, back pain  HPI: John [name] is a 23 y.o. male  With DM 1 and mild neuropathy presents with intractable vomiting and back pain starting today.  Denies missing insulin, f/c, cough, dysuria. Felt slightly nauseated. CBGs 200s last night.  In ED, acidotic with pH 7.2, elevated AG and CBG >400.  bolused IVF and started on insulin gtt. Nausea currently better.   Review of Systems:  Complete ROS as above, otherwise negative.  Past Medical History  Diagnosis Date  . 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 Essential hypertension, benign 02/04/2011  . Goiter, unspecified 02/04/2011  . ADD (attention deficit disorder)   . Allergic rhinitis    Past Surgical History  Procedure Laterality Date  . Tonsillectomy     Social History: drinks occasionally. Smokes 1/3 ppd. Denies drugs.  No Known Allergies  Family History  Problem Relation Age of Onset  . Cancer Neg Hx   . Lupus Cousin      Prior to Admission medications   Medication Sig Start Date End Date Taking? Authorizing Provider  acetaminophen (TYLENOL) 500 MG tablet Take 500 mg by mouth every 6 (six) hours as needed.   Yes Historical Provider, MD  Aspirin-Acetaminophen-Caffeine (GOODY HEADACHE PO) Take 1 tablet by mouth daily as needed. For headache   Yes Historical Provider, MD  insulin aspart (NOVOLOG) 100 UNIT/ML injection Inject 10 Units into the skin 3 (three) times daily with meals.    Yes Historical Provider, MD  insulin glargine (LANTUS) 100 UNIT/ML injection Inject 25-55 Units into the skin See admin instructions. Take 55units  in the AM and 25units in the PM   Yes Historical Provider, MD  glucose blood test strip 1 each by Other route 2 (two) times daily. Use as instructed 01/03/13   Romero Belling, MD  naproxen sodium (ANAPROX DS) 550 MG tablet Take 1 tablet (550 mg total) by mouth 2 (two) times daily with a meal. Patient not taking: Reported on 07/07/2014 03/26/14   Merlyn Albert, MD  SUMAtriptan (IMITREX) 50 MG tablet Take 1 tablet (50 mg total) by mouth once. Take one tablet at first sign of headache. May repeat in 2 hours if needed. Patient not taking: Reported on 07/07/2014 03/26/14   Merlyn Albert, MD   Physical Exam: Filed Vitals:   07/07/14 1545 07/07/14 1600 07/07/14 1615 07/07/14 1722  BP: 119/61 115/60  111/56  Pulse: 109 106    Temp:   98.3 F (36.8 C) 98.2 F (36.8 C)  TempSrc:   Oral Oral  Resp: 15 14    Height:    5\' 9"  (1.753 m)  Weight:    68.04 kg (150 lb)  SpO2: 100% 100%      Wt Readings from Last 3 Encounters:  07/07/14 68.04 kg (150 lb)  03/26/14 66.225 kg (146 lb)  01/21/14 68.947 kg (152 lb)    BP 113/60 mmHg  Pulse 110  Temp(Src) 98.6 F (37 C) (Oral)  Resp 17  Ht 5\' 9"  (1.753 m)  Wt 68.04 kg (150 lb)  BMI 22.14 kg/m2  SpO2 100%  General Appearance:    Alert, cooperative, thin WM NAD  Head:    Normocephalic, without obvious abnormality, atraumatic  Eyes:    PERRL, conjunctiva/corneas clear, EOM's intact,  Nose:   Dry MM. Op without erythema or exudate  Throat:   Lips, mucosa, and tongue normal; teeth and gums normal  Neck:   Supple, symmetrical, trachea midline, no adenopathy;       thyroid:  No enlargement/tenderness/nodules; no carotid   bruit or JVD  Back:     No spinal tenderness. No CVA tenderness. Paraspinal musculature spasm   Lungs:     Clear to auscultation bilaterally, respirations unlabored  Chest wall:    No tenderness or deformity  Heart:    Regular rate and rhythm, S1 and S2 normal, no murmur, rub   or gallop  Abdomen:     Soft, non-tender, bowel sounds active, ND  Genitalia:    deferred  Rectal:    deferred  Extremities:   Extremities normal, atraumatic, no cyanosis or edema  Pulses:   2+ and  symmetric all extremities  Skin:   Skin color, texture, turgor normal, no rashes or lesions  Lymph nodes:   Cervical, supraclavicular, and axillary nodes normal  Neurologic:   CNII-XII intact. Normal strength, sensation and reflexes      throughout             Psych: normal affect Labs on Admission:  Basic Metabolic Panel:  Recent Labs Lab 07/07/14 1325  NA 136*  K 5.2  CL 90*  CO2 14*  GLUCOSE 458*  BUN 27*  CREATININE 0.90  CALCIUM 10.2   Liver Function Tests: No results for input(s): AST, ALT, ALKPHOS, BILITOT, PROT, ALBUMIN in the last 168 hours. No results for input(s): LIPASE, AMYLASE in the last 168 hours. No results for input(s): AMMONIA in the last 168 hours. CBC:  Recent Labs Lab 07/07/14 1325  WBC 21.8*  NEUTROABS 20.1*  HGB 17.7*  HCT 50.6  MCV 82.4  PLT 615*   Cardiac Enzymes: No results for input(s): CKTOTAL, CKMB, CKMBINDEX, TROPONINI in the last 168 hours.  BNP (last 3 results) No results for input(s): PROBNP in the last 8760 hours. CBG:  Recent Labs Lab 07/07/14 1316 07/07/14 1432 07/07/14 1536  GLUCAP 359* 343* 257*    Radiological Exams on Admission: Dg Chest Portable 1 View  07/07/2014   CLINICAL DATA:  Chest pain  EXAM: PORTABLE CHEST - 1 VIEW  COMPARISON:  None.  FINDINGS: Normal mediastinum and cardiac silhouette. Normal pulmonary vasculature. No evidence of effusion, infiltrate, or pneumothorax. No acute bony abnormality.  IMPRESSION: Normal chest radiograph.   Electronically Signed   By: Genevive Bi M.D.   On: 07/07/2014 14:25    Assessment/Plan Principal Problem:   DKA, type 1: SDU, IVF, IV insulin, serial BMETs. Check hgb a1C. Clears and ADAT Active Problems:   Dehydration secondary to above: continue fluid rescussitiation   Leukocytosis: no evidence of infection. monitor for fever   Tobacco abuse: counseled against    Time spent: 60  Juluis Fitzsimmons L [facility] Pager [phone]

## 2014-07-07 NOTE — ED Notes (Signed)
[facility], RN unavailable for report.

## 2014-07-07 NOTE — ED Notes (Signed)
Blood glucose 257.

## 2014-07-07 NOTE — ED Notes (Signed)
Blood glucose 343.

## 2014-07-07 NOTE — Treatment Plan (Signed)
23 yr old with DKA , ph 7.20, bicarb 14  cbg 343  Admitted to step down

## 2014-07-07 NOTE — ED Notes (Signed)
Pt c/o n/v/d x 5 hrs

## 2014-07-07 NOTE — ED Notes (Signed)
Placed on cardiac, pulse ox and bp monitoring. 

## 2014-07-07 NOTE — ED Notes (Signed)
MD at bedside. 

## 2014-07-07 NOTE — ED Provider Notes (Signed)
CSN: [id number]     Arrival date & time 07/07/14  1308 History   First MD Initiated Contact with Patient 07/07/14 1327     Chief Complaint  Patient presents with  . Emesis      Patient is a 23 y.o. male presenting with vomiting. The history is provided by the patient and a significant other.  Emesis Severity:  Severe Duration:  5 hours Timing:  Intermittent Progression:  Worsening Chronicity:  New Relieved by:  Nothing Worsened by:  Nothing tried Associated symptoms: abdominal pain, chills, diarrhea and myalgias   Associated symptoms: no fever   Abdominal pain:    Location:  Generalized   Quality:  Aching   Severity:  Mild   Onset quality:  Gradual   Progression:  Unchanged Risk factors: diabetes   Risk factors: no sick contacts and no travel to endemic areas   Patient reports onset of vomiting/diarrhea over 5 hrs ago.  He reports he wasn't feel prior to going to bed then woke up with vomiting.  He reports mild diarrhea.  No blood in vomit/stool.  No fever He reports back pain and myalgias.      PMH - Diabetes Fam history - mother with diabetes  Past Medical History  Diagnosis Date  . 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 Essential hypertension, benign 02/04/2011  . Goiter, unspecified 02/04/2011  . ADD (attention deficit disorder)   . Allergic rhinitis    Past Surgical History  Procedure Laterality Date  . Tonsillectomy     Family History  Problem Relation Age of Onset  . Cancer Neg Hx   . Lupus Cousin    History  Substance Use Topics  . Smoking status: Current Every Day Smoker -- 1.00 packs/day for 1 years    Types: Cigarettes  . Smokeless tobacco: Former NeurosurgeonUser    Quit date: 06/26/2013  . Alcohol Use: No    Review of Systems  Constitutional: Positive for chills and fatigue.  Respiratory: Positive for shortness of breath.   Cardiovascular:       CP with cough   Gastrointestinal: Positive  for vomiting, abdominal pain and diarrhea.  Musculoskeletal: Positive for myalgias.  Neurological: Positive for weakness.  All other systems reviewed and are negative.     Allergies  Review of patient's allergies indicates no known allergies.  Home Medications   Prior to Admission medications   Medication Sig Start Date End Date Taking? Authorizing Provider  insulin aspart (NOVOLOG) 100 UNIT/ML injection Inject into the skin 3 (three) times daily with meals.   Yes Historical Provider, MD  insulin glargine (LANTUS) 100 UNIT/ML injection Inject 55 Units into the skin 2 (two) times daily.   Yes Historical Provider, MD  glucose blood test strip 1 each by Other route 2 (two) times daily. Use as instructed 01/03/13   Romero BellingSean Ellison, MD  naproxen sodium (ANAPROX DS) 550 MG tablet Take 1 tablet (550 mg total) by mouth 2 (two) times daily with a meal. 03/26/14   Merlyn AlbertWilliam S Luking, MD  SUMAtriptan (IMITREX) 50 MG tablet Take 1 tablet (50 mg total) by mouth once. Take one tablet at first sign of headache. May repeat in 2 hours if needed. 03/26/14   Merlyn AlbertWilliam S Luking, MD   BP 134/74 mmHg  Pulse 121  Temp(Src) 98.2 F (36.8 C)  Resp 16  Ht 5\' 9"  (1.753 m)  Wt 150 lb (68.04 kg)  BMI 22.14 kg/m2  SpO2 100% Physical  Exam CONSTITUTIONAL:  Ill appearing.  Patient smells ketotic HEAD: Normocephalic/atraumatic EYES: EOMI/PERRL ENMT: Mucous membranes dry NECK: supple no meningeal signs SPINE/BACK:mild tenderness to midline spine, No bruising/crepitance/stepoffs noted to spine CV: S1/S2 noted, no murmurs/rubs/gallops noted LUNGS: mild tachypnea noted, Lungs are clear to auscultation bilaterally, no apparent distress ABDOMEN: soft, nontender, no rebound or guarding, bowel sounds noted throughout abdomen GU:no cva tenderness NEURO: Pt is awake/alert/appropriate, moves all extremitiesx4.  No facial droop.  He has equal strength with hip flex/extension in lower extremities EXTREMITIES: pulses normal/equal,  full ROM SKIN: warm, color normal PSYCH: no abnormalities of mood noted, alert and oriented to situation  ED Course  Procedures   CRITICAL CARE Performed by: Joya GaskinsWICKLINE,Shakeila Pfarr W Total critical care time: 40 Critical care time was exclusive of separately billable procedures and treating other patients. Critical care was necessary to treat or prevent imminent or life-threatening deterioration. Critical care was time spent personally by me on the following activities: development of treatment plan with patient and/or surrogate as well as nursing, discussions with consultants, evaluation of patient's response to treatment, examination of patient, obtaining history from patient or surrogate, ordering and performing treatments and interventions, ordering and review of laboratory studies, ordering and review of radiographic studies, pulse oximetry and re-evaluation of patient's condition. PATIENT IS IN DIABETIC KETOACIDOSIS REQUIRING IV FLUIDS AND IV INSULIN DRIP PH at 7.2. PATIENT HAS ANION GAP Labs Review Labs Reviewed  BASIC METABOLIC PANEL - Abnormal; Notable for the following:    Sodium 136 (*)    Chloride 90 (*)    CO2 14 (*)    Glucose, Bld 458 (*)    BUN 27 (*)    Anion gap 32 (*)    All other components within normal limits  CBC WITH DIFFERENTIAL - Abnormal; Notable for the following:    WBC 21.8 (*)    RBC 6.14 (*)    Hemoglobin 17.7 (*)    Platelets 615 (*)    Neutrophils Relative % 91 (*)    Lymphocytes Relative 5 (*)    Monocytes Relative 2 (*)    Neutro Abs 20.1 (*)    Basophils Absolute 0.2 (*)    All other components within normal limits  CBG MONITORING, ED - Abnormal; Notable for the following:    Glucose-Capillary 359 (*)    All other components within normal limits  I-STAT VENOUS BLOOD GAS, ED - Abnormal; Notable for the following:    pH, Ven 7.208 (*)    pCO2, Ven 35.2 (*)    pO2, Ven 47.0 (*)    Bicarbonate 14.0 (*)    Acid-base deficit 13.0 (*)    All other  components within normal limits  CBG MONITORING, ED - Abnormal; Notable for the following:    Glucose-Capillary 343 (*)    All other components within normal limits  URINALYSIS, ROUTINE W REFLEX MICROSCOPIC  BLOOD GAS, VENOUS    Imaging Review Dg Chest Portable 1 View  07/07/2014   CLINICAL DATA:  Chest pain  EXAM: PORTABLE CHEST - 1 VIEW  COMPARISON:  None.  FINDINGS: Normal mediastinum and cardiac silhouette. Normal pulmonary vasculature. No evidence of effusion, infiltrate, or pneumothorax. No acute bony abnormality.  IMPRESSION: Normal chest radiograph.   Electronically Signed   By: Genevive BiStewart  Edmunds M.D.   On: 07/07/2014 14:25     Medications  insulin regular (NOVOLIN R,HUMULIN R) 250 Units in sodium chloride 0.9 % 250 mL (1 Units/mL) infusion (2.8 Units/hr Intravenous New Bag/Given 07/07/14 1444)  sodium chloride 0.9 %  bolus 1,000 mL (0 mLs Intravenous Stopped 07/07/14 1401)  morphine 4 MG/ML injection 4 mg (4 mg Intravenous Given 07/07/14 1401)  sodium chloride 0.9 % bolus 1,000 mL (0 mLs Intravenous Stopped 07/07/14 1446)  sodium chloride 0.9 % bolus 1,000 mL (1,000 mLs Intravenous New Bag/Given 07/07/14 1446)      MDM    2:55 PM Patient noted to be dehydrated, hyperglycemic and he is in DKA He is requiring IV fluid as well as IV insulin He will need admission to stepdown unit He was given IV morphine for back/body pain D/w dr Susie Cassette and will admit to stepdown [facility] at [facility]  He appears improved after IV fluids here in the ED.  He is requesting PO fluids   Final diagnoses:  Pain  Diabetic ketoacidosis without coma associated with type 1 diabetes mellitus  Dehydration  Leukocytosis    Nursing notes including past medical history and social history reviewed and considered in documentation xrays/imaging reviewed by myself and considered during evaluation Labs/vital reviewed myself and considered during evaluation     Joya Gaskins, MD 07/07/14 1457

## 2014-07-07 NOTE — ED Notes (Signed)
Portable cxr to bedside

## 2014-07-08 LAB — CBC WITH DIFFERENTIAL/PLATELET
BASOS ABS: 0 10*3/uL (ref 0.0–0.1)
Basophils Relative: 0 % (ref 0–1)
EOS PCT: 1 % (ref 0–5)
Eosinophils Absolute: 0.1 10*3/uL (ref 0.0–0.7)
HCT: 39.2 % (ref 39.0–52.0)
HEMOGLOBIN: 13.7 g/dL (ref 13.0–17.0)
LYMPHS PCT: 24 % (ref 12–46)
Lymphs Abs: 2.8 10*3/uL (ref 0.7–4.0)
MCH: 28.7 pg (ref 26.0–34.0)
MCHC: 34.9 g/dL (ref 30.0–36.0)
MCV: 82.2 fL (ref 78.0–100.0)
MONO ABS: 0.8 10*3/uL (ref 0.1–1.0)
Monocytes Relative: 6 % (ref 3–12)
Neutro Abs: 8.2 10*3/uL — ABNORMAL HIGH (ref 1.7–7.7)
Neutrophils Relative %: 69 % (ref 43–77)
Platelets: 425 10*3/uL — ABNORMAL HIGH (ref 150–400)
RBC: 4.77 MIL/uL (ref 4.22–5.81)
RDW: 12 % (ref 11.5–15.5)
WBC: 11.9 10*3/uL — AB (ref 4.0–10.5)

## 2014-07-08 LAB — ALBUMIN: ALBUMIN: 3.6 g/dL (ref 3.5–5.2)

## 2014-07-08 LAB — BASIC METABOLIC PANEL
Anion gap: 11 (ref 5–15)
Anion gap: 12 (ref 5–15)
BUN: 19 mg/dL (ref 6–23)
BUN: 18 mg/dL (ref 6–23)
CHLORIDE: 102 meq/L (ref 96–112)
CO2: 20 mEq/L (ref 19–32)
CO2: 22 mEq/L (ref 19–32)
CREATININE: 0.82 mg/dL (ref 0.50–1.35)
Calcium: 8.3 mg/dL — ABNORMAL LOW (ref 8.4–10.5)
Calcium: 8.6 mg/dL (ref 8.4–10.5)
Chloride: 104 mEq/L (ref 96–112)
Creatinine, Ser: 0.82 mg/dL (ref 0.50–1.35)
GFR calc Af Amer: >90 mL/min (ref >90)
GFR calc non Af Amer: >90 mL/min (ref >90)
GLUCOSE: 238 mg/dL — AB (ref 70–99)
Glucose, Bld: 84 mg/dL (ref 70–99)
POTASSIUM: 4.6 meq/L (ref 3.7–5.3)
Potassium: 4 mEq/L (ref 3.7–5.3)
Sodium: 134 mEq/L — ABNORMAL LOW (ref 137–147)
Sodium: 137 mEq/L (ref 137–147)

## 2014-07-08 LAB — GLUCOSE, CAPILLARY
GLUCOSE-CAPILLARY: 209 mg/dL — AB (ref 70–99)
Glucose-Capillary: 246 mg/dL — ABNORMAL HIGH (ref 70–99)
Glucose-Capillary: 140 mg/dL — ABNORMAL HIGH (ref 70–99)
Glucose-Capillary: 97 mg/dL (ref 70–99)
Glucose-Capillary: 126 mg/dL — ABNORMAL HIGH (ref 70–99)
Glucose-Capillary: 313 mg/dL — ABNORMAL HIGH (ref 70–99)
Glucose-Capillary: 232 mg/dL — ABNORMAL HIGH (ref 70–99)
Glucose-Capillary: 219 mg/dL — ABNORMAL HIGH (ref 70–99)

## 2014-07-08 LAB — PROTIME-INR
INR: 1.2 (ref 0.00–1.49)
Prothrombin Time: 15.3 seconds — ABNORMAL HIGH (ref 11.6–15.2)

## 2014-07-08 LAB — HEMOGLOBIN A1C
HEMOGLOBIN A1C: 9.1 % — AB (ref <5.7)
MEAN PLASMA GLUCOSE: 214 mg/dL — AB (ref <117)

## 2014-07-08 MED ORDER — GLUCOSE 40 % PO GEL: 1.0000 | ORAL | Status: DC | PRN

## 2014-07-08 MED ORDER — INSULIN ASPART 100 UNIT/ML ~~LOC~~ SOLN
0.0000 [IU] | Freq: Three times a day (TID) | SUBCUTANEOUS | Status: DC
  Administered 2014-07-08: 5 [IU] via SUBCUTANEOUS
  Administered 2014-07-08: 11 [IU] via SUBCUTANEOUS

## 2014-07-08 MED ORDER — DEXTROSE 50 % IV SOLN: 50.0000 mL | Freq: Once | INTRAVENOUS | Status: AC | PRN

## 2014-07-08 MED ORDER — INSULIN GLARGINE 100 UNIT/ML ~~LOC~~ SOLN: 55.0000 [IU] | Freq: Every day | SUBCUTANEOUS | Status: DC

## 2014-07-08 MED ORDER — DEXTROSE 50 % IV SOLN: 25.0000 mL | Freq: Once | INTRAVENOUS | Status: AC | PRN

## 2014-07-08 MED ORDER — INSULIN GLARGINE 100 UNIT/ML ~~LOC~~ SOLN
25.0000 [IU] | Freq: Every day | SUBCUTANEOUS | Status: DC
  Administered 2014-07-08: 25 [IU] via SUBCUTANEOUS
  Filled 2014-07-08: qty 0.25

## 2014-07-08 MED ORDER — INSULIN GLARGINE 100 UNIT/ML ~~LOC~~ SOLN
55.0000 [IU] | Freq: Every day | SUBCUTANEOUS | Status: DC
  Filled 2014-07-08: qty 0.55

## 2014-07-08 MED ORDER — INSULIN GLARGINE 100 UNIT/ML ~~LOC~~ SOLN
20.0000 [IU] | Freq: Every day | SUBCUTANEOUS | Status: DC
  Filled 2014-07-08: qty 0.2

## 2014-07-08 MED ORDER — INSULIN GLARGINE 100 UNIT/ML ~~LOC~~ SOLN
25.0000 [IU] | Freq: Once | SUBCUTANEOUS | Status: AC
  Administered 2014-07-08: 25 [IU] via SUBCUTANEOUS
  Filled 2014-07-08: qty 0.25

## 2014-07-08 MED ORDER — INSULIN GLARGINE 100 UNIT/ML ~~LOC~~ SOLN
30.0000 [IU] | Freq: Every day | SUBCUTANEOUS | Status: DC
  Administered 2014-07-08: 30 [IU] via SUBCUTANEOUS
  Filled 2014-07-08 (×2): qty 0.3

## 2014-07-08 NOTE — Plan of Care (Signed)
Problem: Phase I Progression Outcomes Goal: Pain controlled with appropriate interventions Outcome: Completed/Met Date Met:  07/08/14

## 2014-07-08 NOTE — Progress Notes (Signed)
Attempted to give report but nurse in contact room and will call back as soon as she can. Pt is transferring to 6 north per md orders.

## 2014-07-08 NOTE — Progress Notes (Addendum)
11/17 Pt in with DKA, HgbA1C of 9.1% Home regimen is lantus 55 units in am and 25 units at HS for total of 80 units lantus per day  His weight in kg is only 68 kg. Typically type 1 should need 1/2 her weight in kg and 1/2 for meal coverage, 34 units divided into 10 units tidwc (as he takes at home). Will speak with patient as to why he is on so much basal insulin as sensitivity for type 1 would not require this amount of basal. .A1C is high as well at 9.1%.   Thank you, Lenor CoffinAnn Urania Pearlman, RN, CNS, Diabetes Coordinator [phone]) Ad spoke with patient at bedside.  Pt is on set doses of lantus and novolog meal coverage. Pt does not take a correction insulin per say. May be helpful to d/c pt on a correction scale, sensitive tidwc. Thank you, Lenor CoffinAnn Stephan Draughn, RN, CNS, Diabetes Coordinator [phone])

## 2014-07-08 NOTE — Progress Notes (Addendum)
Gave report to 6 north nurse and pt will be going to room 10 per md orders. Nurse tech taking pt shortly. Notified central telemetry. Pt's telemetry discontinued.

## 2014-07-08 NOTE — Plan of Care (Signed)
Problem: Phase I Progression Outcomes Goal: CBGs steadily decreasing on IV insulin drip Outcome: Completed/Met Date Met:  07/08/14 Goal: Monitor hydration status Outcome: Progressing Goal: Acidosis resolving Outcome: Completed/Met Date Met:  07/08/14 Goal: K+ level approaching normal with therapy Outcome: Completed/Met Date Met:  07/08/14 Goal: Nausea/vomiting controlled with antiemetics Outcome: Completed/Met Date Met:  07/08/14 Goal: Pain controlled with appropriate interventions Outcome: Progressing Goal: OOB as tolerated unless otherwise ordered Outcome: Progressing

## 2014-07-08 NOTE — Progress Notes (Signed)
[facility]  Randye LoboBrandon Q Ambrosino JYN:[id number] DOB: June 09, 1991 DOA: 07/07/2014 PCP: Harlow AsaLUKING,W S, MD   Brief narrative: 23 y.o. male with DM 1 and mild neuropathy presented with intractable vomiting and back pain starting on date of [resentation. Denied missing insulin, f/c, cough, dysuria. Felt slightly nauseated. CBGs 200s the previous night.   In ED he was acidotic with pH 7.2, elevated AG and CBG >400. bolused IVF and started on insulin gtt.  HPI/Subjective: No complaints. Says CBGS typically at home around 200s- this was confirmed with HgbA1c with avg CBG 214 (calculated)  Assessment/Plan: Active Problems:   DKA, type 1 AG closed- transitioned to Lantus- pt confirmed dose 55 units am and 25 units PM with SSI at home- HgbA1c 9.1 and given reports of avg CBG 200s needs tighter control esp with recent episode of DKA requiring hospitalization-sts has no insurance so can't FU with [facility] so have asked CM [facility]    Dehydration Cont IVFs for now- enc po    Leukocytosis Secondary to DKA- has improved with correction of CBGs and hydration.    Tobacco abuse   DVT prophylaxis: SQ Heparin Code Status: Full Family Communication: Girlfriend at bedside Disposition Plan/Expected LOS: Transfer to floor but keep on Team 1 for discharge   Consultants: None  Procedures: None  Cultures: None  Antibiotics: None  Objective: Blood pressure 102/65, pulse 82, temperature 98 F (36.7 C), temperature source Oral, resp. rate 18, height 5\' 9"  (1.753 m), weight 150 lb (68.04 kg), SpO2 100 %.  Intake/Output Summary (Last 24 hours) at 07/08/14 1333 Last data filed at 07/08/14 1233  Gross per 24 hour  Intake 1362.5 ml  Output   1180 ml  Net  182.5 ml     Exam: Gen: No acute respiratory distress Chest: Clear to auscultation bilaterally without wheezes, rhonchi or  crackles, room air Cardiac: Regular rate and rhythm, S1-S2, no rubs murmurs or gallops, no peripheral edema, no JVD Abdomen: Soft nontender nondistended without obvious hepatosplenomegaly, no ascites Extremities: Symmetrical in appearance without cyanosis, clubbing or effusion  Scheduled Meds:  Scheduled Meds: . heparin  5,000 Units Subcutaneous 3 times per day  . Influenza vac split quadrivalent PF  0.5 mL Intramuscular Tomorrow-1000  . insulin aspart  0-15 Units Subcutaneous TID WC  . insulin glargine  25 Units Subcutaneous QHS  . [START ON 07/09/2014] insulin glargine  55 Units Subcutaneous Daily   Continuous Infusions: . sodium chloride 75 mL/hr at 07/08/14 0324    Data Reviewed: Basic Metabolic Panel:  Recent Labs Lab 07/07/14 1325 07/07/14 1925 07/08/14 0023 07/08/14 0327  NA 136* 137 134* 137  K 5.2 4.8 4.6 4.0  CL 90* 102 102 104  CO2 14* 14* 20 22  GLUCOSE 458* 224* 238* 84  BUN 27* 18 18 19   CREATININE 0.90 0.81 0.82 0.82  CALCIUM 10.2 8.7 8.3* 8.6   Liver Function Tests:  Recent Labs Lab 07/08/14 0750  ALBUMIN 3.6   No results for input(s): LIPASE, AMYLASE in the last 168 hours. No results for input(s): AMMONIA in the last 168 hours. CBC:  Recent Labs Lab 07/07/14 1325 07/08/14 0750  WBC 21.8* 11.9*  NEUTROABS 20.1* 8.2*  HGB 17.7* 13.7  HCT 50.6 39.2  MCV 82.4 82.2  PLT 615* 425*   Cardiac Enzymes: No results for input(s): CKTOTAL, CKMB, CKMBINDEX, TROPONINI in the last 168 hours.  BNP (last 3 results) No results for input(s): PROBNP in the last 8760 hours. CBG:  Recent Labs Lab 07/08/14 0013 07/08/14 0115 07/08/14 0217 07/08/14 0323 07/08/14 1230  GLUCAP 209* 246* 140* 97 313*    Recent Results (from the past 240 hour(s))  MRSA PCR Screening     Status: None   Collection Time: 07/07/14  5:33 PM  Result Value Ref Range Status   MRSA by PCR NEGATIVE NEGATIVE Final    Comment:        The GeneXpert MRSA Assay (FDA approved for  NASAL specimens only), is one component of a comprehensive MRSA colonization surveillance program. It is not intended to diagnose MRSA infection nor to guide or monitor treatment for MRSA infections.      Studies:  Recent x-ray studies have been reviewed in detail by the Attending Physician  Time spent :      Junious Silkllison Ellis, ANP [facility] Office  [phone] Pager [phone]   **If unable to reach the above provider after paging please contact the Flow Manager @ [phone]  On-Call/Text Page:      [email or web address]      password [email or web address]  If 7PM-7AM, please contact night-coverage [email or web address] Password [email or web address] 07/08/2014, 1:33 PM   LOS: 1 day   Examined patient with ANP Revonda StandardAllison and discussed assessment and plan and agree with above plan. Patient with multiple complex medical problems> 30 minutes spent directly on patient care

## 2014-07-08 NOTE — Plan of Care (Signed)
Problem: Phase I Progression Outcomes Goal: Monitor hydration status Outcome: Progressing Goal: Acidosis resolving Outcome: Progressing Goal: NPO or per MD order Outcome: Completed/Met Date Met:  07/08/14 Goal: K+ level approaching normal with therapy Outcome: Progressing Goal: Nausea/vomiting controlled with antiemetics Outcome: Progressing Goal: Pain controlled with appropriate interventions Outcome: Progressing Goal: Voiding-avoid urinary catheter unless indicated Outcome: Completed/Met Date Met:  07/08/14 Goal: Pt. states reason for hospitalization Outcome: Completed/Met Date Met:  07/08/14

## 2014-07-08 NOTE — Progress Notes (Signed)
Utilization Review Completed.  

## 2014-07-09 LAB — GLUCOSE, CAPILLARY
GLUCOSE-CAPILLARY: 72 mg/dL (ref 70–99)
GLUCOSE-CAPILLARY: 309 mg/dL — AB (ref 70–99)
GLUCOSE-CAPILLARY: 73 mg/dL (ref 70–99)
Glucose-Capillary: 69 mg/dL — ABNORMAL LOW (ref 70–99)
Glucose-Capillary: 270 mg/dL — ABNORMAL HIGH (ref 70–99)
Glucose-Capillary: 155 mg/dL — ABNORMAL HIGH (ref 70–99)

## 2014-07-09 MED ORDER — INSULIN GLARGINE 100 UNIT/ML ~~LOC~~ SOLN
30.0000 [IU] | Freq: Every day | SUBCUTANEOUS | Status: DC
  Filled 2014-07-09: qty 0.3

## 2014-07-09 MED ORDER — INSULIN GLARGINE 100 UNIT/ML ~~LOC~~ SOLN: 60.0000 [IU] | Freq: Every day | SUBCUTANEOUS | Status: DC

## 2014-07-09 MED ORDER — INSULIN ASPART 100 UNIT/ML ~~LOC~~ SOLN: 0.0000 [IU] | Freq: Every day | SUBCUTANEOUS | Status: DC

## 2014-07-09 MED ORDER — INSULIN GLARGINE 100 UNIT/ML ~~LOC~~ SOLN: SUBCUTANEOUS | Status: DC

## 2014-07-09 MED ORDER — INSULIN GLARGINE 100 UNIT/ML ~~LOC~~ SOLN: 30.0000 [IU] | Freq: Every day | SUBCUTANEOUS | Status: DC

## 2014-07-09 MED ORDER — INSULIN ASPART 100 UNIT/ML ~~LOC~~ SOLN
4.0000 [IU] | Freq: Three times a day (TID) | SUBCUTANEOUS | Status: DC
  Administered 2014-07-09 – 2014-07-10 (×3): 4 [IU] via SUBCUTANEOUS

## 2014-07-09 MED ORDER — INSULIN ASPART 100 UNIT/ML ~~LOC~~ SOLN
0.0000 [IU] | Freq: Three times a day (TID) | SUBCUTANEOUS | Status: DC
  Administered 2014-07-09: 5 [IU] via SUBCUTANEOUS
  Administered 2014-07-10: 1 [IU] via SUBCUTANEOUS

## 2014-07-09 MED ORDER — INSULIN GLARGINE 100 UNIT/ML ~~LOC~~ SOLN
20.0000 [IU] | Freq: Every day | SUBCUTANEOUS | Status: DC
  Filled 2014-07-09: qty 0.2

## 2014-07-09 MED ORDER — INSULIN GLARGINE 100 UNIT/ML ~~LOC~~ SOLN
25.0000 [IU] | Freq: Every day | SUBCUTANEOUS | Status: DC
  Administered 2014-07-09: 25 [IU] via SUBCUTANEOUS
  Filled 2014-07-09 (×2): qty 0.25

## 2014-07-09 MED ORDER — TRAMADOL HCL 50 MG PO TABS
50.0000 mg | ORAL_TABLET | Freq: Four times a day (QID) | ORAL | Status: DC | PRN
  Administered 2014-07-09 – 2014-07-10 (×3): 50 mg via ORAL
  Filled 2014-07-09 (×3): qty 1

## 2014-07-09 MED ORDER — INSULIN GLARGINE 100 UNIT/ML ~~LOC~~ SOLN
65.0000 [IU] | Freq: Every day | SUBCUTANEOUS | Status: DC
  Filled 2014-07-09: qty 0.65

## 2014-07-09 MED ORDER — IBUPROFEN 400 MG PO TABS
400.0000 mg | ORAL_TABLET | Freq: Four times a day (QID) | ORAL | Status: DC | PRN
  Administered 2014-07-09: 400 mg via ORAL
  Filled 2014-07-09: qty 1

## 2014-07-09 NOTE — Progress Notes (Signed)
Note labile sugars with primarily low normal readings since early am of 69 then 74 despite diet. Previous am dose Lantus had not been given and will now be dc'd. Plan HS dosing only at 30 units and will cancel discharge for now. This was discussed with my attending  Junious SilkAllison Ellis, ANP

## 2014-07-09 NOTE — Progress Notes (Signed)
Inpatient Diabetes Program Recommendations  AACE/ADA: New Consensus Statement on Inpatient Glycemic Control (2013)  Target Ranges:  Prepandial:   less than 140 mg/dL      Peak postprandial:   less than 180 mg/dL (1-2 hours)      Critically ill patients:  140 - 180 mg/dL   Inpatient Diabetes Program Recommendations Insulin - Basal: As noted yesterday, according to patient's weight,the basal dose of insulin calculates to 35 units lantus per day. (Noted decrease made today in lantus order) Correction (SSI): Sensitive correction is typically appropriate for type 1 dm using basal, meal coverage and correction. Insulin - Meal Coverage: Pt will also need meal coverage in addition to  correction . Pt takes 10 units meal coverage at home, but here would recommend using 4 units tidwc   Pt did not get correction nor meal coverage this am due to glucose of 120 mg/dL (requiring no correction). The ac lunch glucose was over 300 mg/dL. Meal coverage would help prevent the post-prandial excursions.  Thank you, Lenor CoffinAnn Shelsea Hangartner, RN, CNS, Diabetes Coordinator [phone])

## 2014-07-09 NOTE — Progress Notes (Addendum)
Called by RN to speak with patient who has concerns with his basal regimen while here.  I spoke with patient and wife/girlfriend regarding his home regimen of a total of 80 units lantus (55 units in the am and 25 at HS). When asked if pt has ever used a basal and bolus for meal coverage and correction, he answered that he use to prior to seeing his present PCP.  He states he has asked for this type of regimen for a long time but states he was denied.  According to his weight of 68 kg, pt's total daily needs based on 0.8 units/kg totals 68 units insulin. One-half of TDD is 34 units and the other half for meals and correction.  Entered orders for basal at 30 units, 4 units MC novolog (will probably need more basal and meal coverage, but this is a safe start.)  Ordered the sensitive correction scale tidwc and the HS scale. Have ordered care management consult to assess qualification for [facility] as his insurance will not be able to afford another PCP nor the insulins to start. But pt can be changed to the Novolin N and Reg if need be. Will await findings from case management. Per Dr Sharon SellerMcClung orders for insulin regimen tonight and in the am are entered with co-sign required. Will revisit in am although pt wants to go home tonight, Dr Sharon SellerMcClung states he will need to stay until the am. Will review regimen again with patient-and assess type of insulin based on care management's assessment of financial needs and qualifications. Thank you, Lenor CoffinAnn Maijor Hornig, RN, CNS, Diabetes Coordinator [phone]) AD-Pt's actual weight is 136 lbs, 62 kg.  Will adjust the lantus to 25 units to be safe.per Dr Sharon SellerMcClung. Thank you, Lenor CoffinAnn Lillion Elbert, RN, CNS, Diabetes Coordinator [phone])

## 2014-07-09 NOTE — Discharge Summary (Addendum)
John [name] EXB:[id number] DOB: June 02, 1991 DOA: 07/07/2014  PCP: Harlow Asa, MD  Admit date: 07/07/2014 Discharge date: 07/10/2014  Time spent: >30 minutes  Recommendations for Outpatient Follow-up:  1. Call the Clinic numbers provided to you by case management to establish follow up appointment. Pt encouraged to do so prior to discharge while waiting for MD to formally discharge. Counseled patient and girlfriend to maintain a logbook of his FSBS, and all food that he consumes, and when he sees his new PCP present this at his appointment which will help them make adjustments to his medications.   Discharge Diagnoses:    DKA, type 1-RESOLVED   Dehydration-RESOLVED   Leukocytosis-RESOLVED   Tobacco abuse  Discharge Condition: stable  Diet recommendation: CARBOHYDRATE MODIFIED  Filed Weights   07/07/14 1313 07/07/14 1722  Weight: 68.04 kg (150 lb) 68.04 kg (150 lb)    History of present illness:  23 y.o. WM  with DM 1 and mild neuropathy presented with intractable vomiting and back pain starting on date of presentation. Denied missing insulin, f/c, cough, dysuria. Felt slightly nauseated. CBGs 200s the previous night.   In ED he was acidotic with pH 7.2, elevated AG and CBG >400. bolused IVF and started on insulin gtt  Hospital Course:   DKA, type 1 AG closed- transitioned to Lantus am 11/17. Patient confirmed home dose 55 units am and 25 units PM with SSI at home. HgbA1c was 9.1 and given reports of avg CBG 200s needs tighter control esp with recent episode of DKA requiring hospitalization. Noted that CBGs peaked just after lunch for the previous 24 hours to >300 and remained greater than 200 until early am when had decreased to 69. On 11/18 the am dose of Lantus was increased to 65 units and his pm dose was decreased to 20 units. Unfortunately his CBGs remained low even after eating so the am dose was dc'd and the pm dose increased to 25 units with plans to use once  daily HS dosing as opposed to twice daily. Novolog meal coverage was added at 4 units. Discharge was placed on hold. By 11/19 CBGs were stable on the above regimen. Pt now appropriate for discharge.  Patient also reported that he has no insurance so can't FU with Dr. Everardo All. CM gave the patient and his girlfriend contact information [facility] with instructions on how to arrange for post hospital appintment.  Additionally, he reports that in the past he has done well on lower doses of Lantus in combination with meal coverage and SSI. Pt has a prior SSI at home and has been instructed to use this after discharge. In addition if his CBGs decrease further he may decrease his Lantus to 20 units q HS.  Dehydration Resolved as of 11/18  Leukocytosis Secondary to DKA and had improved with correction of CBGs and hydration. No clinical signs of infection found.  Procedures: None  Consultations: None  Discharge Exam: Filed Vitals:   07/10/14 1024  BP: 97/58  Pulse: 84  Temp: 97.8 F (36.6 C)  Resp: 18   Gen: No acute respiratory distress Chest: Clear to auscultation bilaterally, room air Cardiac: Regular rate and rhythm, S1-S2, no rubs murmurs or gallops Abdomen: Soft nontender nondistended without obvious hepatosplenomegaly, no ascites Extremities: No signif cyanosis, clubbing, edema B LE    Current Discharge Medication List Lantus 20-25 units at HS (may decrease to 20 if find CBGs  running low)  Novolog insulin 4 units TID WC (meal coverage) Generic ordered  Insulin syringes Disp #300  Previous sliding scale prn   Prior to Admission medications   Medication Sig Start Date End Date Taking? Authorizing Provider  acetaminophen (TYLENOL) 500 MG tablet Take 500 mg by mouth every 6 (six) hours as needed.   Yes Historical Provider, MD  Aspirin-Acetaminophen-Caffeine (GOODY HEADACHE PO) Take 1 tablet by mouth daily as needed.  For headache   Yes Historical Provider, MD  insulin aspart (NOVOLOG) 100 UNIT/ML injection Inject 10 Units into the skin 3 (three) times daily with meals.    Yes Historical Provider, MD  glucose blood test strip 1 each by Other route 2 (two) times daily. Use as instructed 01/03/13   Romero Belling, MD  insulin glargine (LANT100 UNIT/ML injection Inject 25 units daily at bedtime 07/10/14   Russella Dar, NP  insulin regular (NOVOLIN R,HUMULIN R) 100 units/mL injection Inject 0.04 mLs (4 Units total) into the skin 3 (three) times daily before meals. 07/10/14   Russella Dar, NP  INSULIN SYRINGE .5CC/28G 28G X 1/2" 0.5 ML MISC Use to administer insulins as prescribed 07/10/14   Russella Dar, NP  naproxen sodium (ANAPROX DS) 550 MG tablet Take 1 tablet (550 mg total) by mouth 2 (two) times daily with a meal. Patient not taking: Reported on 07/07/2014 03/26/14   Merlyn Albert, MD  SUMAtriptan (IMITREX) 50 MG tablet Take 1 tablet (50 mg total) by mouth once. Take one tablet at first sign of headache. May repeat in 2 hours if needed. Patient not taking: Reported on 07/07/2014 03/26/14   Merlyn Albert, MD       No Known Allergies Follow-up Information    Follow up with [facility]    .   Contact information:   [address] [place] [address] [phone]      Follow up with [facility]   Specialty:  Pediatrics   Why:  Please call contact numbers given to you by the case manager so you can establish with one of these clinics- recommned appopintment in next 7-10 days   Contact information:   [address] [place] [address] [phone]      Microbiology: Recent Results (from the past 240 hour(s))  MRSA PCR Screening     Status: None   Collection Time: 07/07/14  5:33 PM  Result Value Ref Range Status   MRSA by PCR NEGATIVE NEGATIVE Final    Comment:        The GeneXpert MRSA Assay  (FDA approved for NASAL specimens only), is one component of a comprehensive MRSA colonization surveillance program. It is not intended to diagnose MRSA infection nor to guide or monitor treatment for MRSA infections.     Labs: Basic Metabolic Panel:  Recent Labs Lab 07/07/14 1325 07/07/14 1925 07/08/14 0023 07/08/14 0327 07/10/14 0523  NA 136* 137 134* 137 141  K 5.2 4.8 4.6 4.0 3.7  CL 90* 102 102 104 101  CO2 14* 14* 20 22 29   GLUCOSE 458* 224* 238* 84 66*  BUN 27* 18 18 19 10   CREATININE 0.90 0.81 0.82 0.82 0.72  CALCIUM 10.2 8.7 8.3* 8.6 8.8   Liver Function Tests:  Recent Labs Lab 07/08/14 0750  ALBUMIN 3.6   CBC:  Recent Labs Lab 07/07/14 1325 07/08/14 0750  WBC 21.8* 11.9*  NEUTROABS 20.1* 8.2*  HGB 17.7*  13.7  HCT 50.6 39.2  MCV 82.4 82.2  PLT 615* 425*   CBG:  Recent Labs Lab 07/09/14 2055 07/09/14 2155 07/10/14 0250 07/10/14 0753 07/10/14 1217  GLUCAP 73 155* 119* 82 131*   Signed:  Kalob Bergen, J ANP [facility] 07/10/2014, 2:07 PM  11/18: Pt not yet ready for d/c as CBGs remain quite labile, with episodes of hypoglycemia.  Furthermore, pt does not have a reliable source for his meds, or for f/u.  Will adjust tx regimen today and follow CBG.  In AM will ask CM to set pt up w/ specific appointment at [facility], and to also assist w/ med procurement.   I have personally examined this patient and reviewed the entire database. I have reviewed the above note, made any necessary editorial changes, and agree with its content.  Lonia Blood, MD [facility]  Examined patient and discussed assessment and plan with ANP Revonda Standard and agree with above. Patient with multiple complicated medical problems> 35 minutes used in direct patient care.

## 2014-07-09 NOTE — Discharge Instructions (Signed)
Insulin Aspart injection What is this medicine? INSULIN ASPART (IN su lin AS part) is a human-made form of insulin. This drug lowers the amount of sugar in your blood. It is a fast acting insulin that starts working faster than regular insulin. It will not work as long as regular insulin. This medicine may be used for other purposes; ask your health care provider or pharmacist if you have questions. COMMON BRAND NAME(S): NovoLog, NovoLog Flexpen, NovoLog PenFill What should I tell my health care provider before I take this medicine? They need to know if you have any of these conditions: -episodes of hypoglycemia -kidney disease -liver disease -an unusual or allergic reaction to insulin, metacresol, other medicines, foods, dyes, or preservatives -pregnant or trying to get pregnant -breast-feeding How should I use this medicine? This medicine is for injection under the skin. Use exactly as directed. It is important to follow the directions given to you by your health care professional or doctor. You should start your meal within 5 to 10 minutes after injection. Have food ready before injection. Do not delay eating. You will be taught how to use this medicine and how to adjust doses for activities and illness. Do not use more insulin than prescribed. Do not use more or less often than prescribed. Always check the appearance of your insulin before using it. This medicine should be clear and colorless like water. Do not use if it is cloudy, thickened, colored, or has solid particles in it. It is important that you put your used needles and syringes in a special sharps container. Do not put them in a trash can. If you do not have a sharps container, call your pharmacist or healthcare provider to get one. Talk to your pediatrician regarding the use of this medicine in children. While this drug may be prescribed for children as young as 80 years of age for selected conditions, precautions do  apply. Overdosage: If you think you have taken too much of this medicine contact a poison control center or emergency room at once. NOTE: This medicine is only for you. Do not share this medicine with others. What if I miss a dose? It is important not to miss a dose. Your health care professional or doctor should discuss a plan for missed doses with you. If you do miss a dose, follow their plan. Do not take double doses. What may interact with this medicine? -other medicines for diabetes Many medications may cause an increase or decrease in blood sugar, these include: -alcohol containing beverages -aspirin and aspirin-like drugs -chloramphenicol -chromium -diuretics -male hormones, like estrogens or progestins and birth control pills -heart medicines -isoniazid -male hormones or anabolic steroids -medicines for weight loss -medicines for allergies, asthma, cold, or cough -medicines for mental problems -medicines called MAO Inhibitors like Nardil, Parnate, Marplan, Eldepryl -niacin -NSAIDs, medicines for pain and inflammation, like ibuprofen or naproxen -pentamidine -phenytoin -probenecid -quinolone antibiotics like ciprofloxacin, levofloxacin, ofloxacin -some herbal dietary supplements -steroid medicines like prednisone or cortisone -thyroid medicine Some medications can hide the warning symptoms of low blood sugar. You may need to monitor your blood sugar more closely if you are taking one of these medications. These include: -beta-blockers such as atenolol, metoprolol, propranolol -clonidine -guanethidine -reserpine This list may not describe all possible interactions. Give your health care provider a list of all the medicines, herbs, non-prescription drugs, or dietary supplements you use. Also tell them if you smoke, drink alcohol, or use illegal drugs. Some items may interact with  your medicine. What should I watch for while using this medicine? Visit your health care  professional or doctor for regular checks on your progress. A test called the HbA1C (A1C) will be monitored. This is a simple blood test. It measures your blood sugar control over the last 2 to 3 months. You will receive this test every 3 to 6 months. Learn how to check your blood sugar. Learn the symptoms of low and high blood sugar and how to manage them. Always carry a quick-source of sugar with you in case you have symptoms of low blood sugar. Examples include hard sugar candy or glucose tablets. Make sure others know that you can choke if you eat or drink when you develop serious symptoms of low blood sugar, such as seizures or unconsciousness. They must get medical help at once. Tell your doctor or health care professional if you have high blood sugar. You might need to change the dose of your medicine. If you are sick or exercising more than usual, you might need to change the dose of your medicine. Do not skip meals. Ask your doctor or health care professional if you should avoid alcohol. Many nonprescription cough and cold products contain sugar or alcohol. These can affect blood sugar. Make sure that you have the right kind of syringe for the type of insulin you use. Try not to change the brand and type of insulin or syringe unless your health care professional or doctor tells you to. Switching insulin brand or type can cause dangerously high or low blood sugar. Always keep an extra supply of insulin, syringes, and needles on hand. Use a syringe one time only. Throw away syringe and needle in a closed container to prevent accidental needle sticks. Insulin pens and cartridges should never be shared. Even if the needle is changed, sharing may result in passing of viruses like hepatitis or HIV. Wear a medical ID bracelet or chain, and carry a card that describes your disease and details of your medicine and dosage times. What side effects may I notice from receiving this medicine? Side effects that  you should report to your health care professional or doctor as soon as possible: -allergic reactions like skin rash, itching or hives, swelling of the face, lips, or tongue -breathing problems -signs and symptoms of high blood sugar such as dizziness, dry mouth, dry skin, fruity breath, nausea, stomach pain, increased hunger or thirst, increased urination -signs and symptoms of low blood sugar such as feeling anxious, confusion, dizziness, increased hunger, unusually weak or tired, sweating, shakiness, cold, irritable, headache, blurred vision, fast heartbeat, loss of consciousness Side effects that usually do not require medical attention (report to your health care professional or doctor if they continue or are bothersome): -increase or decrease in fatty tissue under the skin due to overuse of a particular injection site -itching, burning, swelling, or rash at site where injected This list may not describe all possible side effects. Call your doctor for medical advice about side effects. You may report side effects to FDA at 1-800-FDA-1088. Where should I keep my medicine? Keep out of the reach of children. Store unopened insulin vials in a refrigerator between 2 and 8 degrees C (36 and 46 degrees F). Do not freeze or use if the insulin has been frozen. Opened vials (vials currently in use) may be stored in the refrigerator or at room temperature, at approximately 30 degrees C (86 degrees F) or cooler. Keeping your insulin at room temperature decreases  the amount of pain during injection. Once opened, your insulin can be used for 28 days. After 28 days, the vial of insulin should be thrown away. Store unopened cartridges, FlexPens, or Novalog Innolet systems in a refrigerator between 2 and 8 degrees C (36 and 46 degrees F.) Do not freeze or use if the insulin has been frozen. Once opened, the Novalog Innolet system, FlexPen, and cartridges that are inserted into pens should be kept at room  temperature, approximately 25 degrees C (77 degrees F) or cooler. Do not store in the refrigerator. Once opened, the insulin can be used for 28 days. After 28 days, the cartridge, Novalog Innolet system or FlexPen should be thrown away. Protect from light and excessive heat. Throw away any unused medicine after the expiration date or after the specified time for room temperature storage has passed. NOTE: This sheet is a summary. It may not cover all possible information. If you have questions about this medicine, talk to your doctor, pharmacist, or health care provider.  2015, [employer]. (2013-11-01 10:23:01) Insulin Glargine injection What is this medicine? INSULIN GLARGINE (IN su lin GLAR geen) is a human-made form of insulin. This drug lowers the amount of sugar in your blood. It is a long-acting insulin that is usually given once a day. This medicine may be used for other purposes; ask your health care provider or pharmacist if you have questions. COMMON BRAND NAME(S): Lantus, Lantus SoloStar, Toujeo SoloStar What should I tell my health care provider before I take this medicine? They need to know if you have any of these conditions: -episodes of hypoglycemia -kidney disease -liver disease -an unusual or allergic reaction to insulin, metacresol, other medicines, foods, dyes, or preservatives -pregnant or trying to get pregnant -breast-feeding How should I use this medicine? This medicine is for injection under the skin. Use this medicine at the same time each day. Use exactly as directed. This insulin should never be mixed in the same syringe with other insulins before injection. Do not vigorously shake before use. You will be taught how to use this medicine and how to adjust doses for activities and illness. Do not use more insulin than prescribed. Always check the appearance of your insulin before using it. This medicine should be clear and colorless like water. Do not use it  if it is cloudy, thickened, colored, or has solid particles in it. It is important that you put your used needles and syringes in a special sharps container. Do not put them in a trash can. If you do not have a sharps container, call your pharmacist or healthcare provider to get one. Talk to your pediatrician regarding the use of this medicine in children. Special care may be needed. Overdosage: If you think you have taken too much of this medicine contact a poison control center or emergency room at once. NOTE: This medicine is only for you. Do not share this medicine with others. What if I miss a dose? It is important not to miss a dose. Your health care professional or doctor should discuss a plan for missed doses with you. If you do miss a dose, follow their plan. Do not take double doses. What may interact with this medicine? -other medicines for diabetes Many medications may cause an increase or decrease in blood sugar, these include: -alcohol containing beverages -aspirin and aspirin-like drugs -chloramphenicol -chromium -diuretics -male hormones, like estrogens or progestins and birth control pills -heart medicines -isoniazid -male hormones or anabolic steroids -medicines  for weight loss -medicines for allergies, asthma, cold, or cough -medicines for mental problems -medicines called MAO Inhibitors like Nardil, Parnate, Marplan, Eldepryl -niacin -NSAIDs, medicines for pain and inflammation, like ibuprofen or naproxen -pentamidine -phenytoin -probenecid -quinolone antibiotics like ciprofloxacin, levofloxacin, ofloxacin -some herbal dietary supplements -steroid medicines like prednisone or cortisone -thyroid medicine Some medications can hide the warning symptoms of low blood sugar. You may need to monitor your blood sugar more closely if you are taking one of these medications. These include: -beta-blockers such as atenolol, metoprolol,  propranolol -clonidine -guanethidine -reserpine This list may not describe all possible interactions. Give your health care provider a list of all the medicines, herbs, non-prescription drugs, or dietary supplements you use. Also tell them if you smoke, drink alcohol, or use illegal drugs. Some items may interact with your medicine. What should I watch for while using this medicine? Visit your health care professional or doctor for regular checks on your progress. A test called the HbA1C (A1C) will be monitored. This is a simple blood test. It measures your blood sugar control over the last 2 to 3 months. You will receive this test every 3 to 6 months. Learn how to check your blood sugar. Learn the symptoms of low and high blood sugar and how to manage them. Always carry a quick-source of sugar with you in case you have symptoms of low blood sugar. Examples include hard sugar candy or glucose tablets. Make sure others know that you can choke if you eat or drink when you develop serious symptoms of low blood sugar, such as seizures or unconsciousness. They must get medical help at once. Tell your doctor or health care professional if you have high blood sugar. You might need to change the dose of your medicine. If you are sick or exercising more than usual, you might need to change the dose of your medicine. Do not skip meals. Ask your doctor or health care professional if you should avoid alcohol. Many nonprescription cough and cold products contain sugar or alcohol. These can affect blood sugar. Make sure that you have the right kind of syringe for the type of insulin you use. Try not to change the brand and type of insulin or syringe unless your health care professional or doctor tells you to. Switching insulin brand or type can cause dangerously high or low blood sugar. Always keep an extra supply of insulin, syringes, and needles on hand. Use a syringe one time only. Throw away syringe and needle in  a closed container to prevent accidental needle sticks. Insulin pens and cartridges should never be shared. Even if the needle is changed, sharing may result in passing of viruses like hepatitis or HIV. Wear a medical ID bracelet or chain, and carry a card that describes your disease and details of your medicine and dosage times. What side effects may I notice from receiving this medicine? Side effects that you should report to your health care professional or doctor as soon as possible: -allergic reactions like skin rash, itching or hives, swelling of the face, lips, or tongue -breathing problems -signs and symptoms of high blood sugar such as dizziness, dry mouth, dry skin, fruity breath, nausea, stomach pain, increased hunger or thirst, increased urination -signs and symptoms of low blood sugar such as feeling anxious, confusion, dizziness, increased hunger, unusually weak or tired, sweating, shakiness, cold, irritable, headache, blurred vision, fast heartbeat, loss of consciousness Side effects that usually do not require medical attention (report to your  health care professional or doctor if they continue or are bothersome): -increase or decrease in fatty tissue under the skin due to overuse of a particular injection site -itching, burning, swelling, or rash at site where injected This list may not describe all possible side effects. Call your doctor for medical advice about side effects. You may report side effects to FDA at 1-800-FDA-1088. Where should I keep my medicine? Keep out of the reach of children. Store unopened vials in a refrigerator between 2 and 8 degrees C (36 and 46 degrees F). Do not freeze or use if the insulin has been frozen. Opened vials (vials currently in use) may be stored in the refrigerator or at room temperature, at approximately 25 degrees C (77 degrees F) or cooler. Keeping your insulin at room temperature decreases the amount of pain during injection. Once opened,  your insulin can be used for 28 days. After 28 days, the vial should be thrown away. Store unopened pen-injector cartridges in a refrigerator between 2 and 8 degrees C (36 and 46 degrees F.) Do not freeze or use if the insulin has been frozen. Insulin cartridges inserted into the [employer] system should be kept at room temperature, approximately 25 degrees C (77 degrees F) or cooler. Do not store in the refrigerator. Once inserted into the [employer] system, the insulin can be used for 28 days. After 28 days, the cartridge should be thrown away. Protect from light and excessive heat. Throw away any unused medicine after the expiration date or after the specified time for room temperature storage has passed. NOTE: This sheet is a summary. It may not cover all possible information. If you have questions about this medicine, talk to your doctor, pharmacist, or health care provider.  2015, [employer]. (2013-10-17 10:13:56) Diabetic Ketoacidosis Diabetic ketoacidosis (DKA) is a life-threatening complication of type 1 diabetes. It must be quickly recognized and treated. Treatment requires hospitalization. CAUSES  When there is no insulin in the body, glucose (sugar) cannot be used, and the body breaks down fat for energy. When fat breaks down, acids (ketones) build up in the blood. Very high levels of glucose and high levels of acids lead to severe loss of body fluids (dehydration) and other dangerous chemical changes. This stresses your vital organs and can cause coma or death. SIGNS AND SYMPTOMS   Tiredness (fatigue).  Weight loss.  Excessive thirst.  Ketones in your urine.  Light-headedness.  Fruity or sweet smelling breath.  Excessive urination.  Visual changes.  Confusion or irritability.  Nausea or vomiting.  Rapid breathing.  Stomachache or abdominal pain. DIAGNOSIS  Your health care provider will diagnose DKA based on your history, physical exam, and blood tests. The  health care provider will check to see if you have another illness that caused you to go into DKA. Most of this will be done quickly in an emergency room. TREATMENT   Fluid replacement to correct dehydration.  Insulin.  Correction of electrolytes, such as potassium and sodium.  Antibiotic medicines. PREVENTION  Always take your insulin. Do not skip your insulin injections.  If you are sick, treat yourself quickly. Your body often needs more insulin to fight the illness.  Check your blood glucose regularly.  Check urine ketones if your blood glucose is greater than 240 milligrams per deciliter (mg/dL).  Do not use outdated (expired) insulin.  If your blood glucose is high, drink plenty of fluids. This helps flush out ketones. HOME CARE INSTRUCTIONS   If you are sick,  follow the advice of your health care provider.  To prevent dehydration, drink enough water and fluids to keep your urine clear or pale yellow.  If you cannot eat, alternate between drinking fluids with sugar (soda, juices, flavored gelatin) and salty fluids (broth, bouillon).  If you can eat, follow your usual diet and drink sugar-free liquids (water, diet drinks).  Always take your usual dose of insulin. If you cannot eat or if your glucose is getting too low, call your health care provider for further instructions.  Continue to monitor your blood or urine ketones every 3-4 hours around the clock. Set your alarm clock or have someone wake you up. If you are too sick, have someone test it for you.  Rest and avoid exercise. SEEK MEDICAL CARE IF:   You have a fever.  You have ketones in your urine, or your blood glucose is higher than a level your health care provider suggests. You may need extra insulin. Call your health care provider if you need advice on adjusting your insulin.  You cannot drink at least a tablespoon (15 mL) of fluid every 15-20 minutes.  You have been vomiting for more than 2 hours.  You  have symptoms of DKA:  Fruity smelling breath.  Breathing faster or slower.  Becoming very sleepy. SEEK IMMEDIATE MEDICAL CARE IF:   You have signs of dehydration:  Decreased urination.  Increased thirst.  Dry skin and mouth.  Light-headedness.  Your blood glucose is very high (as advised by your health care provider) twice in a row.  You faint.  You have chest pain or trouble breathing.  You have a sudden, severe headache.  You have sudden weakness in one arm or one leg.  You have sudden trouble speaking or swallowing.  You have vomiting or diarrhea that is getting worse after 3 hours.  You have abdominal pain. MAKE SURE YOU:   Understand these instructions.  Will watch your condition.  Will get help right away if you are not doing well or get worse. Document Released: 08/05/2000 Document Revised: 08/13/2013 Document Reviewed: 02/11/2009 [facility] Patient Information 2015 [place], [place]. This information is not intended to replace advice given to you by your health care provider. Make sure you discuss any questions you have with your health care provider.

## 2014-07-09 NOTE — Plan of Care (Signed)
Problem: Phase II Progression Outcomes Goal: Nausea & vomiting resolved Outcome: Completed/Met Date Met:  07/09/14

## 2014-07-10 DIAGNOSIS — E1010 Type 1 diabetes mellitus with ketoacidosis without coma: Secondary | ICD-10-CM | POA: Insufficient documentation

## 2014-07-10 LAB — BASIC METABOLIC PANEL
Anion gap: 11 (ref 5–15)
BUN: 10 mg/dL (ref 6–23)
CO2: 29 mEq/L (ref 19–32)
Calcium: 8.8 mg/dL (ref 8.4–10.5)
Chloride: 101 mEq/L (ref 96–112)
Creatinine, Ser: 0.72 mg/dL (ref 0.50–1.35)
Glucose, Bld: 66 mg/dL — ABNORMAL LOW (ref 70–99)
POTASSIUM: 3.7 meq/L (ref 3.7–5.3)
SODIUM: 141 meq/L (ref 137–147)

## 2014-07-10 LAB — GLUCOSE, CAPILLARY
Glucose-Capillary: 119 mg/dL — ABNORMAL HIGH (ref 70–99)
Glucose-Capillary: 82 mg/dL (ref 70–99)
Glucose-Capillary: 131 mg/dL — ABNORMAL HIGH (ref 70–99)

## 2014-07-10 MED ORDER — "INSULIN SYRINGE 28G X 1/2"" 0.5 ML MISC": Status: DC

## 2014-07-10 MED ORDER — INSULIN REGULAR HUMAN 100 UNIT/ML IJ SOLN: 4.0000 [IU] | Freq: Three times a day (TID) | INTRAMUSCULAR | Status: DC

## 2014-07-10 MED ORDER — INSULIN GLARGINE 100 UNIT/ML ~~LOC~~ SOLN: SUBCUTANEOUS | Status: DC

## 2014-07-10 MED ORDER — INSULIN ASPART 100 UNIT/ML ~~LOC~~ SOLN: SUBCUTANEOUS | Status: DC

## 2014-07-10 MED ORDER — INSULIN ASPART 100 UNIT/ML ~~LOC~~ SOLN: 4.0000 [IU] | Freq: Three times a day (TID) | SUBCUTANEOUS | Status: DC

## 2014-07-10 MED ORDER — PNEUMOCOCCAL VAC POLYVALENT 25 MCG/0.5ML IJ INJ: 0.5000 mL | INJECTION | INTRAMUSCULAR | Status: DC

## 2014-07-10 NOTE — Progress Notes (Signed)
[name] ICU Progress Note  John [name] ZOX:[id number] DOB: Mar 08, 1991 DOA: 07/07/2014 PCP: John AsaLUKING,W S, MD   Brief narrative: 23 y.o. male with DM [name] and mild neuropathy presented with intractable vomiting and back pain starting on date of [resentation. Denied missing insulin, f/c, cough, dysuria. Felt slightly nauseated. CBGs 200s the previous night.   In ED he was acidotic with pH 7.2, elevated AG and CBG >400. bolused IVF and started on insulin gtt.  HPI/Subjective: No complaints.Eager for [employer]  Assessment/Plan: Active Problems:   DKA, type [name] AG closed- transitioned to Lantus am 11/17. Patient confirmed home dose 55 units am and 25 units PM with SSI at home. HgbA1c was 9.[name] and given reports of avg CBG 200s needs tighter control esp with recent episode of DKA requiring hospitalization. Noted that CBGs peaked just after lunch for the previous 24 hours to >300 and remained greater than 200 until early am when had decreased to 69. On 11/18 the am dose of Lantus was increased to 65 units and his pm dose was decreased to 20 units. Unfortunately his CBGs remained low even after eating so the am dose was dc'd and the pm dose increased to 25 units with plans to use once daily HS dosing as opposed to twice daily. Novolog meal coverage was added at 4 units. Discharge was placed on hold. By 11/19 CBGs were stable on the above regimen. Pt now appropriate for discharge. (SEE DC SUMMARY COMPLETED 11/18)    Dehydration Cont IVFs for now- enc po    Leukocytosis Secondary to DKA- has improved with correction of CBGs and hydration.    Tobacco abuse   DVT prophylaxis: SQ Heparin Code Status: Full Family Communication: Girlfriend at bedside Disposition Plan/Expected LOS: DISCHARGE   Consultants: None  Procedures: None  Cultures: None  Antibiotics: None  Objective: Blood pressure 95/57, pulse 64, temperature 97.8 F (36.6 C), temperature source Oral, resp.  rate 16, height 5\' 9"  ([name].753 m), weight 150 lb (68.04 kg), SpO2 100 %.  Intake/Output Summary (Last 24 hours) at 07/10/14 0940 Last data filed at 07/10/14 0859  Gross per 24 hour  Intake    840 ml  Output      0 ml  Net    840 ml     Exam: Gen: No acute respiratory distress Chest: Clear to auscultation bilaterally, room air Cardiac: Regular rate and rhythm, S1-S2, no rubs murmurs or gallops, no peripheral edema, no JVD Abdomen: Soft nontender nondistended without obvious hepatosplenomegaly, no ascites Extremities: Symmetrical in appearance without cyanosis, clubbing or effusion  Scheduled Meds:  Scheduled Meds: . heparin  5,000 Units Subcutaneous 3 times per day  . insulin aspart  0-5 Units Subcutaneous QHS  . insulin aspart  0-9 Units Subcutaneous TID WC  . insulin aspart  4 Units Subcutaneous TID WC  . insulin glargine  25 Units Subcutaneous QHS  . [START ON 07/11/2014] pneumococcal 23 valent vaccine  0.5 mL Intramuscular Tomorrow-1000   Continuous Infusions:    Data Reviewed: Basic Metabolic Panel:  Recent Labs Lab 07/07/14 1325 07/07/14 1925 07/08/14 0023 07/08/14 0327 07/10/14 0523  NA 136* 137 134* 137 141  K 5.2 4.8 4.6 4.0 3.7  CL 90* 102 102 104 101  CO2 14* 14* 20 22 29   GLUCOSE 458* 224* 238* 84 66*  BUN 27* 18 18 19 10   CREATININE 0.90 0.81 0.82 0.82 0.72  CALCIUM 10.2 8.7 8.3* 8.6 8.8   Liver Function Tests:  Recent Labs Lab 07/08/14 0750  ALBUMIN 3.6   No results for input([name]): LIPASE, AMYLASE in the last 168 hours. No results for input([name]): AMMONIA in the last 168 hours. CBC:  Recent Labs Lab 07/07/14 1325 07/08/14 0750  WBC 21.8* 11.9*  NEUTROABS 20.[name]* 8.2*  HGB 17.7* 13.7  HCT 50.6 39.2  MCV 82.4 82.2  PLT 615* 425*   Cardiac Enzymes: No results for input([name]): CKTOTAL, CKMB, CKMBINDEX, TROPONINI in the last 168 hours. BNP (last 3 results) No results for input([name]): PROBNP in the last 8760 hours. CBG:  Recent Labs Lab  07/09/14 1803 07/09/14 2055 07/09/14 2155 07/10/14 0250 07/10/14 0753  GLUCAP 270* 73 155* 119* 82    Recent Results (from the past 240 hour([name]))  MRSA PCR Screening     Status: None   Collection Time: 07/07/14  5:33 PM  Result Value Ref Range Status   MRSA by PCR NEGATIVE NEGATIVE Final    Comment:        The GeneXpert MRSA Assay (FDA approved for NASAL specimens only), is one component of a comprehensive MRSA colonization surveillance program. It is not intended to diagnose MRSA infection nor to guide or monitor treatment for MRSA infections.      Studies:  Recent x-ray studies have been reviewed in detail by the Attending Physician  Time spent :      Junious Silkllison Ellis, ANP [facility] Office  [phone] Pager [phone]   **If unable to reach the above provider after paging please contact the Flow Manager @ [phone]  On-Call/Text Page:      [email or web address]      password [email or web address]  If 7PM-7AM, please contact night-coverage [email or web address] Password [email or web address] 07/10/2014, 9:40 AM   LOS: 3 days  Examined patient and discussed assessment and plan with ANP Revonda StandardAllison and agree with above. Patient with multiple complex medical issues> 35 minutes spent in direct patient care

## 2014-07-10 NOTE — Progress Notes (Signed)
Patient discharged home with instructions, verbalized understanding.

## 2014-07-10 NOTE — Care Management Note (Signed)
    Page 1 of 2   07/10/2014     11:58:20 AM CARE MANAGEMENT NOTE 07/10/2014  Patient:  John [name],John [name]   Account Number:  [id number]  Date Initiated:  07/08/2014  Documentation initiated by:  [name],John [name]  Subjective/Objective Assessment:   dx DKA; lives with dad    PCP  Ardyth GalWilliam Luking     Action/Plan:   Anticipated DC Date:  07/10/2014   Anticipated DC Plan:  HOME/SELF CARE  In-house referral  Financial Counselor      DC Planning Services  CM consult  Indigent Health Clinic  Medication Assistance      Choice offered to / List presented to:             Status of service:  Completed, signed off Medicare Important Message given?   (If response is "NO", the following Medicare IM given date fields will be blank) Date Medicare IM given:   Medicare IM given by:   Date Additional Medicare IM given:   Additional Medicare IM given by:    Discharge Disposition:  HOME/SELF CARE  Per UR Regulation:  Reviewed for med. necessity/level of care/duration of stay  If discussed at Long Length of Stay Meetings, dates discussed:    Comments:  07-10-14  Energy managernsurance Verfifer returned call , patient does have BCBS , unavailable to provided [facility] . Provided patient with BCBS phone number and ID number . John FlurryHeather Sapphira Harjo RN BSN    07-10-14 Patient states he has no insurance . Lantus assistance program application given to patient . Called insurance verfifer left voice mail .  [facility] ( patient has contact information ) , Monday Jul 14, 2014 at 0900.  MATCH letter given and explained.  John FlurryHeather Camillo Quadros RN BSN    07/08/14 [phone] John PrimeHenrietta Mayo RN MSN BSN CCM Per pt, he has no insurance, has been getting samples from Dr AetnaEllison's office.  States he will be starting a new job that has no insurance benefit.  Provided contact information for [facility] [place], pt will decide which clinic he wants and  will call them for appt.  [facility] ready for [facility].

## 2014-07-10 NOTE — Progress Notes (Addendum)
Inpatient Diabetes Program Recommendations  AACE/ADA: New Consensus Statement on Inpatient Glycemic Control (2013)  Target Ranges:  Prepandial:   less than 140 mg/dL      Peak postprandial:   less than 180 mg/dL (1-2 hours)      Critically ill patients:  140 - 180 mg/dL   Based on  cbg results last HS and this am, I recommend the following for d/c orders:  Inpatient Diabetes Program Recommendations Insulin - Basal: due to mild fasting hypoglycemia this am, would recommend using only 20 units lantus at HS. Correction (SSI): /wiykd duscgaged on sensitive correction tidwc. (sensitivity factor appox 1 unit per 30 mg/dL over target goal glucose. Insulin - Meal Coverage: Decrease meal coverage to 3 units tidwc. His insulin to cho ratio calcullates to 1 unit per 8 gams carb, however I think his sensitivity is greater.   Will talk with patient to help lpt understand the regimen (He states he used to be on a similar regimen, so he is familiar) Will talk with care management [facility] and check on insurance status and coverage. Thank you, Lenor CoffinAnn Kaylem Gidney, RN, CNS, Diabetes Coordinator [phone]) Ad: spoke with Raliegh IpAlison Elllis, NP this am with my recommendations for d/c orders. Requested I talk with patient about the regimen and assure pt understands. She states Dr Joseph ArtWoods  Should be up there to see him around lunch time. AC

## 2015-03-16 ENCOUNTER — Emergency Department (HOSPITAL_BASED_OUTPATIENT_CLINIC_OR_DEPARTMENT_OTHER): Payer: Self-pay

## 2015-03-16 ENCOUNTER — Emergency Department (HOSPITAL_BASED_OUTPATIENT_CLINIC_OR_DEPARTMENT_OTHER)
Admission: EM | Admit: 2015-03-16 | Discharge: 2015-03-16 | Disposition: A | Discharge status: Home / Self Care | Payer: Self-pay | Attending: Emergency Medicine | Admitting: Emergency Medicine

## 2015-03-16 ENCOUNTER — Encounter (HOSPITAL_BASED_OUTPATIENT_CLINIC_OR_DEPARTMENT_OTHER): Payer: Self-pay | Admitting: *Deleted

## 2015-03-16 DIAGNOSIS — I10 Essential (primary) hypertension: Secondary | ICD-10-CM | POA: Insufficient documentation

## 2015-03-16 DIAGNOSIS — Z794 Long term (current) use of insulin: Secondary | ICD-10-CM | POA: Insufficient documentation

## 2015-03-16 DIAGNOSIS — E1065 Type 1 diabetes mellitus with hyperglycemia: Secondary | ICD-10-CM | POA: Insufficient documentation

## 2015-03-16 DIAGNOSIS — R1032 Left lower quadrant pain: Secondary | ICD-10-CM | POA: Insufficient documentation

## 2015-03-16 DIAGNOSIS — Z87891 Personal history of nicotine dependence: Secondary | ICD-10-CM | POA: Insufficient documentation

## 2015-03-16 DIAGNOSIS — Z8659 Personal history of other mental and behavioral disorders: Secondary | ICD-10-CM | POA: Insufficient documentation

## 2015-03-16 DIAGNOSIS — Z8709 Personal history of other diseases of the respiratory system: Secondary | ICD-10-CM | POA: Insufficient documentation

## 2015-03-16 LAB — URINALYSIS, ROUTINE W REFLEX MICROSCOPIC
Bilirubin Urine: NEGATIVE
Hgb urine dipstick: NEGATIVE
Ketones, ur: 40 mg/dL — AB
Leukocytes, UA: NEGATIVE
NITRITE: NEGATIVE
Protein, ur: NEGATIVE mg/dL
SPECIFIC GRAVITY, URINE: 1.036 — AB (ref 1.005–1.030)
UROBILINOGEN UA: 0.2 mg/dL (ref 0.0–1.0)
pH: 5 (ref 5.0–8.0)

## 2015-03-16 LAB — URINE MICROSCOPIC-ADD ON

## 2015-03-16 LAB — I-STAT VENOUS BLOOD GAS, ED
Acid-base deficit: 2 mmol/L (ref 0.0–2.0)
BICARBONATE: 25.3 meq/L — AB (ref 20.0–24.0)
O2 SAT: 41 %
PH VEN: 7.309 — AB (ref 7.250–7.300)
TCO2: 27 mmol/L (ref 0–100)
pCO2, Ven: 50.4 mmHg — ABNORMAL HIGH (ref 45.0–50.0)
pO2, Ven: 26 mmHg — CL (ref 30.0–45.0)

## 2015-03-16 LAB — BASIC METABOLIC PANEL
Anion gap: 17 — ABNORMAL HIGH (ref 5–15)
Anion gap: 6 (ref 5–15)
BUN: 20 mg/dL (ref 6–20)
BUN: 17 mg/dL (ref 6–20)
CALCIUM: 7.7 mg/dL — AB (ref 8.9–10.3)
CO2: 23 mmol/L (ref 22–32)
CO2: 23 mmol/L (ref 22–32)
CREATININE: 0.99 mg/dL (ref 0.61–1.24)
Calcium: 9.1 mg/dL (ref 8.9–10.3)
Chloride: 91 mmol/L — ABNORMAL LOW (ref 101–111)
Chloride: 107 mmol/L (ref 101–111)
Creatinine, Ser: 0.63 mg/dL (ref 0.61–1.24)
GFR calc Af Amer: >60 mL/min (ref >60)
GFR calc non Af Amer: >60 mL/min (ref >60)
GFR calc non Af Amer: >60 mL/min (ref >60)
Glucose, Bld: 658 mg/dL (ref 65–99)
Glucose, Bld: 213 mg/dL — ABNORMAL HIGH (ref 65–99)
POTASSIUM: 3.7 mmol/L (ref 3.5–5.1)
Potassium: 3.7 mmol/L (ref 3.5–5.1)
SODIUM: 131 mmol/L — AB (ref 135–145)
Sodium: 136 mmol/L (ref 135–145)

## 2015-03-16 LAB — CBC WITH DIFFERENTIAL/PLATELET
BAND NEUTROPHILS: 7 % (ref 0–10)
Basophils Absolute: 0 10*3/uL (ref 0.0–0.1)
Basophils Relative: 0 % (ref 0–1)
Blasts: 0 %
EOS PCT: 0 % (ref 0–5)
Eosinophils Absolute: 0 10*3/uL (ref 0.0–0.7)
HCT: 46 % (ref 39.0–52.0)
Hemoglobin: 16.1 g/dL (ref 13.0–17.0)
LYMPHS PCT: 19 % (ref 12–46)
Lymphs Abs: 2 10*3/uL (ref 0.7–4.0)
MCH: 29.2 pg (ref 26.0–34.0)
MCHC: 35 g/dL (ref 30.0–36.0)
MCV: 83.5 fL (ref 78.0–100.0)
MYELOCYTES: 1 %
Metamyelocytes Relative: 0 %
Monocytes Absolute: 0.2 10*3/uL (ref 0.1–1.0)
Monocytes Relative: 2 % — ABNORMAL LOW (ref 3–12)
NEUTROS PCT: 71 % (ref 43–77)
Neutro Abs: 8.3 10*3/uL — ABNORMAL HIGH (ref 1.7–7.7)
Platelets: 446 10*3/uL — ABNORMAL HIGH (ref 150–400)
Promyelocytes Absolute: 0 %
RBC: 5.51 MIL/uL (ref 4.22–5.81)
RDW: 12.4 % (ref 11.5–15.5)
WBC: 10.5 10*3/uL (ref 4.0–10.5)
nRBC: 0 /100 WBC

## 2015-03-16 LAB — CBG MONITORING, ED
GLUCOSE-CAPILLARY: 246 mg/dL — AB (ref 65–99)
GLUCOSE-CAPILLARY: 177 mg/dL — AB (ref 65–99)

## 2015-03-16 MED ORDER — INSULIN REGULAR HUMAN 100 UNIT/ML IJ SOLN
INTRAMUSCULAR | Status: DC
  Administered 2015-03-16: 5.4 [IU]/h via INTRAVENOUS

## 2015-03-16 MED ORDER — INSULIN ASPART 100 UNIT/ML ~~LOC~~ SOLN: SUBCUTANEOUS | Status: DC

## 2015-03-16 MED ORDER — DEXTROSE-NACL 5-0.45 % IV SOLN
INTRAVENOUS | Status: DC
  Administered 2015-03-16: 12:00:00 via INTRAVENOUS

## 2015-03-16 MED ORDER — SODIUM CHLORIDE 0.9 % IV SOLN
1000.0000 mL | Freq: Once | INTRAVENOUS | Status: AC
  Administered 2015-03-16: 1000 mL via INTRAVENOUS

## 2015-03-16 MED ORDER — ONDANSETRON 8 MG PO TBDP
8.0000 mg | ORAL_TABLET | Freq: Once | ORAL | Status: AC
  Administered 2015-03-16: 8 mg via ORAL
  Filled 2015-03-16: qty 1

## 2015-03-16 MED ORDER — KETOROLAC TROMETHAMINE 30 MG/ML IJ SOLN
30.0000 mg | Freq: Once | INTRAMUSCULAR | Status: AC
  Administered 2015-03-16: 30 mg via INTRAVENOUS
  Filled 2015-03-16: qty 1

## 2015-03-16 MED ORDER — SODIUM CHLORIDE 0.9 % IV SOLN
1000.0000 mL | INTRAVENOUS | Status: DC
  Administered 2015-03-16: 1000 mL via INTRAVENOUS

## 2015-03-16 MED ORDER — INSULIN GLARGINE 100 UNIT/ML ~~LOC~~ SOLN: SUBCUTANEOUS | Status: DC

## 2015-03-16 NOTE — ED Notes (Signed)
Pt given urinal with instructions for cc urine specimen. Pt will notify staff when he is able to provide sample.

## 2015-03-16 NOTE — Discharge Instructions (Signed)
Hyperglycemia °Hyperglycemia occurs when the glucose (sugar) in your blood is too high. Hyperglycemia can happen for many reasons, but it most often happens to people who do not know they have diabetes or are not managing their diabetes properly.  °CAUSES  °Whether you have diabetes or not, there are other causes of hyperglycemia. Hyperglycemia can occur when you have diabetes, but it can also occur in other situations that you might not be as aware of, such as: °Diabetes °· If you have diabetes and are having problems controlling your blood glucose, hyperglycemia could occur because of some of the following reasons: °¨ Not following your meal plan. °¨ Not taking your diabetes medications or not taking it properly. °¨ Exercising less or doing less activity than you normally do. °¨ Being sick. °Pre-diabetes °· This cannot be ignored. Before people develop Type 2 diabetes, they almost always have "pre-diabetes." This is when your blood glucose levels are higher than normal, but not yet high enough to be diagnosed as diabetes. Research has shown that some long-term damage to the body, especially the heart and circulatory system, may already be occurring during pre-diabetes. If you take action to manage your blood glucose when you have pre-diabetes, you may delay or prevent Type 2 diabetes from developing. °Stress °· If you have diabetes, you may be "diet" controlled or on oral medications or insulin to control your diabetes. However, you may find that your blood glucose is higher than usual in the hospital whether you have diabetes or not. This is often referred to as "stress hyperglycemia." Stress can elevate your blood glucose. This happens because of hormones put out by the body during times of stress. If stress has been the cause of your high blood glucose, it can be followed regularly by your caregiver. That way he/she can make sure your hyperglycemia does not continue to get worse or progress to  diabetes. °Steroids °· Steroids are medications that act on the infection fighting system (immune system) to block inflammation or infection. One side effect can be a rise in blood glucose. Most people can produce enough extra insulin to allow for this rise, but for those who cannot, steroids make blood glucose levels go even higher. It is not unusual for steroid treatments to "uncover" diabetes that is developing. It is not always possible to determine if the hyperglycemia will go away after the steroids are stopped. A special blood test called an A1c is sometimes done to determine if your blood glucose was elevated before the steroids were started. °SYMPTOMS °· Thirsty. °· Frequent urination. °· Dry mouth. °· Blurred vision. °· Tired or fatigue. °· Weakness. °· Sleepy. °· Tingling in feet or leg. °DIAGNOSIS  °Diagnosis is made by monitoring blood glucose in one or all of the following ways: °· A1c test. This is a chemical found in your blood. °· Fingerstick blood glucose monitoring. °· Laboratory results. °TREATMENT  °First, knowing the cause of the hyperglycemia is important before the hyperglycemia can be treated. Treatment may include, but is not be limited to: °· Education. °· Change or adjustment in medications. °· Change or adjustment in meal plan. °· Treatment for an illness, infection, etc. °· More frequent blood glucose monitoring. °· Change in exercise plan. °· Decreasing or stopping steroids. °· Lifestyle changes. °HOME CARE INSTRUCTIONS  °· Test your blood glucose as directed. °· Exercise regularly. Your caregiver will give you instructions about exercise. Pre-diabetes or diabetes which comes on with stress is helped by exercising. °· Eat wholesome,   balanced meals. Eat often and at regular, fixed times. Your caregiver or nutritionist will give you a meal plan to guide your sugar intake. °· Being at an ideal weight is important. If needed, losing as little as 10 to 15 pounds may help improve blood  glucose levels. °SEEK MEDICAL CARE IF:  °· You have questions about medicine, activity, or diet. °· You continue to have symptoms (problems such as increased thirst, urination, or weight gain). °SEEK IMMEDIATE MEDICAL CARE IF:  °· You are vomiting or have diarrhea. °· Your breath smells fruity. °· You are breathing faster or slower. °· You are very sleepy or incoherent. °· You have numbness, tingling, or pain in your feet or hands. °· You have chest pain. °· Your symptoms get worse even though you have been following your caregiver's orders. °· If you have any other questions or concerns. °Document Released: 02/01/2001 Document Revised: 10/31/2011 Document Reviewed: 12/05/2011 °ExitCare® Patient Information ©2015 [employer]. This information is not intended to replace advice given to you by your health care provider. Make sure you discuss any questions you have with your health care provider. ° ° °Emergency Department Resource Guide °1) Find a Doctor and Pay Out of Pocket °Although you won't have to find out who is covered by your insurance plan, it is a good idea to ask around and get recommendations. You will then need to call the office and see if the doctor you have chosen will accept you as a new patient and what types of options they offer for patients who are self-pay. Some doctors offer discounts or will set up payment plans for their patients who do not have insurance, but you will need to ask so you aren't surprised when you get to your appointment. ° °2) Contact Your Local Health Department °Not all health departments have doctors that can see patients for sick visits, but many do, so it is worth a call to see if yours does. If you don't know where your local health department is, you can check in your phone book. The CDC also has a tool to help you locate your state's health department, and many state websites also have listings of all of their local health departments. ° °3) Find a Walk-in Clinic °If  your illness is not likely to be very severe or complicated, you may want to try a walk in clinic. These are popping up all over the country in pharmacies, drugstores, and shopping centers. They're usually staffed by nurse practitioners or physician assistants that have been trained to treat common illnesses and complaints. They're usually fairly quick and inexpensive. However, if you have serious medical issues or chronic medical problems, these are probably not your best option. ° °No Primary Care Doctor: °- Call [facility] at  832-8000 - they can help you locate a primary care doctor that  accepts your insurance, provides certain services, etc. °- Physician Referral Service- 1-800-533-3463 ° °Chronic Pain Problems: °Organization         Address  Phone   Notes  °[facility]  (336) 297-2271 Patients need to be referred by their primary care doctor.  ° °Medication Assistance: °Organization         Address  Phone   Notes  °[place] [facility] [address] °[place], [place] [address] (336) 641-8030 --Must be a resident of [place] °-- Must have NO insurance coverage whatsoever (no Medicaid/ Medicare, etc.) °-- The pt. MUST have   a primary care doctor that directs their care regularly and follows them in the community °  °[facility]  (866) 331-1348   °[employer]  (888) 892-1162   ° °Agencies that provide inexpensive medical care: °Organization         Address  Phone   Notes  °[facility]  (336) 832-8035   °[facility]    (336) 832-7272   °[facility] [address] °[place], [place] [address] (336) 832-4777   °[facility] [address], °[place] (336) 271-4999   °[facility]    (336) 373-0678   °[facility]    (336) 272-1050   °[facility] ° [address], [place] Phone:  (336) 832-4444, Fax:  (336) 832-4440 Hours of  Operation:  9 am - 6 pm, M-F.  Also accepts Medicaid/Medicare and self-pay.  °[facility] ° [address], [place] Phone: (336) [phone], Fax: (336) [phone]. Hours of Operation:  8:30 am - 5:30 pm, M-F.  Also accepts Medicaid and self-pay.  °[facility] [place] [address], [place] Phone: (336) 878-6027   °[facility] [address], [place], [place] (336)723-1848, Ext. 123 Mondays & Thursdays: 7-9 AM.  First 15 patients are seen on a first come, first serve basis. °  ° °Medicaid-accepting [place] Providers: ° °Organization         Address  Phone   Notes  °[facility] [address], [place] (336) 641-2100 Also accepts self-pay patients.  °[facility] [address], [place] ° (336) 856-9996   °[facility] [address] [place] [address], [place] (336) 288-8857   °[facility] [address] [place] [address], [place] (336) 299-7000   °Veita Bland [address], [place]  ° (336) 373-1557 Only accepts [place] Access Medicaid patients after they have their name applied to their card.  ° °Self-Pay (no insurance) in [place]: ° °Organization         Address  Phone   Notes  °Sickle Cell Patients, [facility] [address], [place] (336) 832-1970   °[facility] [address], [place] (336) 832-4400   °[facility] ° [address], [place] (336) 992-4800   °[facility]/Dr. Osei-Bonsu ° [address] [place] [address], [place] or [address], [place] (336) [phone] Phone number for both [place] and [place] locations is the same.  °[facility] [address], [place] (336) 299-0000   °[facility] [address] [place] [address], [place] or [address] (336) 852-7530 °(336) 878-2260   °[facility] [address], [place] (336) 350-1642, phone; (336) 294-5005, fax Sees patients 1st and 3rd Saturday of every month.  Must not qualify for public or private insurance (i.e. Medicaid, Medicare, [place] Health Choice, Veterans' Benefits) • Household income should be no more than 200% of the poverty level •The clinic cannot treat you if you are pregnant or think you are pregnant • Sexually transmitted diseases are not treated at the clinic.  ° ° °Dental Care: °Organization         Address  Phone  Notes  °[facility] [address],  (  336) 641-6152 Accepts children up to age 21 who are enrolled in Medicaid or [place] Health Choice; pregnant women with a Medicaid card; and children who have applied for Medicaid or [place] Health Choice, but were declined, whose parents can pay a reduced fee at time of service.  °[facility] [place]  [address], [place] (336) 641-7733 Accepts children up to age 21 who are enrolled in Medicaid or [place] Health Choice; pregnant women with a Medicaid card; and children who have applied for Medicaid or [place] Health Choice, but were declined, whose parents can pay a reduced fee at time of service.  °[facility] ° [address], [place] (336) 641-4533 Patients are seen by appointment only. Walk-ins are not accepted. [facility] will see patients 18 years of age and older. °Monday - Tuesday (8am-5pm) °Most Wednesdays (8:30-5pm) °$30 per visit, cash only  °[facility] ° [address], [place] (336) 641-4533 Patients are seen by appointment only. Walk-ins are not accepted. [facility] will see patients 18 years of age and older. °One Wednesday Evening (Monthly: Volunteer Based).  $30 per visit, cash only  °[facility]  (919) 537-3737 for adults; Children under age 4, call Graduate Pediatric  Dentistry at ([phone]) [phone]. Children aged 4-14, please call ([phone]) [phone] to request a pediatric application. ° Dental services are provided in all areas of dental care including fillings, crowns and bridges, complete and partial dentures, implants, gum treatment, root canals, and extractions. Preventive care is also provided. Treatment is provided to both adults and children. °Patients are selected via a lottery and there is often a waiting list. °  °[facility] [address], °[place] ° (336) 763-8833 [email or web address] °  °[facility] [address], [place], [place] (336)723-1848, Ext. 123 Second and Fourth Thursday of each month, opens at 6:30 AM; Clinic ends at 9 AM.  Patients are seen on a first-come first-served basis, and a limited number are seen during each clinic.  ° °[facility] ° [address], [place], [place] (336) 723-7904   Eligibility Requirements °You must have lived in [place], [place], or [place] counties for at least the last three months. °  You cannot be eligible for state or federal sponsored healthcare insurance, including Veterans Administration, Medicaid, or Medicare. °  You generally cannot be eligible for healthcare insurance through your employer.  °  How to apply: °Eligibility screenings are held every Tuesday and Wednesday afternoon from 1:00 pm until 4:00 pm. You do not need an appointment for the interview!  °[facility] [address], [place], [place] 336-631-2330   °[facility]  336-342-8273   °[facility]  336-703-3100   °[facility]  336-570-6415   ° °Behavioral Health Resources in the Community: °Intensive Outpatient Programs °Organization         Address  Phone  Notes  °[facility] [address], [place], [place] 336-878-6098   °[facility] [address], [place], [place] 336-832-9800   °[facility] [address], [place], [place] ° 336-882-2125   °[facility] [name],  °, [place] 1-800-853-5163 or 336-641-4981   °Substance Abuse Resources °Organization         Address  Phone  Notes  °[facility]    336-882-2125   °[facility]  336-784-9470   °[facility]  336-285-9073   °[facility]  336-845-3988   °Residential & Outpatient Substance Abuse Program  1-800-659-3381   °Psychological Services °Organization         Address  Phone  Notes  °[facility]  336- 832-9600   °[facility]  336- 378-7881   °[facility] [name], [place] 1-800-853-5163 or 336-641-4981   ° °Mobile Crisis Teams °Organization         Address  Phone  Notes  °[facility], Mobile Crisis Care Unit  1-877-626-1772   °Assertive °[facility] ° [address] [place], [place] 336-834-9664   °Sharon DeEsch [address] °[place] 336-554-5454   ° °Self-Help/Support Groups °Organization         Address  Phone             Notes  °[facility] - variety of support groups  336- 373-1402 Call for more information  °Narcotics Anonymous (NA), [facility] [address], °[place]  2 meetings at this location  ° °Residential Treatment Programs °Organization         Address  Phone  Notes  °[facility] [address],    °[place]  1-866-801-8205   °[facility] ° [address], [place], [place] 704-293-8524   °[facility] [address], [place] 336-845-3988 Admissions: 8am-3pm M-F  °[facility] [address]    °[place], [place] 336-841-1104   °[facility] [address], [place], [place] 336-379-7146   °[facility] [address]  °[place], [place] 336-285-9073   °[facility] - Intensive Outpatient [address],  , [place] 336-852-3033   °[facility] [address]  °[place], [place] 1-877-615-2722 or 336-784-9470   °[facility] [address] [place], [place] 336-227-7417 Accepts Medicaid  °[facility] [address]  ° [place] 1-800-659-3381 Substance Abuse/Addiction Treatment  ° °[place] Behavioral Health Resources °Organization         Address  Phone  Notes  °[facility]  (888) 581-9988   °Julie Brannon, PhD [address] [place], [place]   (336) 349-5553 or (336) 951-0000   °[facility]   [address] °[place], [place] (336) 349-4454   °[facility] [address], [place], [place] (336) 342-8316 Insurance/Medicaid/sponsorship through [facility]  °[facility] [address]                                    [place], [place] (336) 342-8316 Therapy/tele-psych/case  °[facility] [address]  ° [place], [place] (336) 349-2233    °Dr. Arfeen  (336) 349-4544   °[facility] [place]  [employer] [place] [facility] 1) [address], [place] °2) [address], [place] °3)  [address], [place] (336) 349-3220 °(336) 342-7768 ° °(336) 342-8140   °[place] [facility] (336) 342-1394 or (336) 342-3537 (After Hours)    ° ° °

## 2015-03-16 NOTE — ED Notes (Signed)
Rt at bedside drawing venous gas, pt denies any pain or nausea at this time.

## 2015-03-16 NOTE — ED Provider Notes (Signed)
Complains of left-sided flank pain onset 3 a.m. Today. Pain is improved with movement and worse with lying still. See urinary frequency. No other associated symptoms. No treatment prior to coming here. On exam alert no distress lungs clear auscultation heart regular rate and rhythm abdomen nondistended nontender flank without tenderness genitalia normal male  Doug Sou, MD 03/16/15 [phone]

## 2015-03-16 NOTE — ED Notes (Signed)
Insulin drip verified with scott bennett, rn

## 2015-03-16 NOTE — ED Notes (Addendum)
Pt amb to room 2 with quick steady gait in nad. Pt reports sudden onset of left flank pain, intermittent, pt states "it hurts so much it makes me sick on my stomach...". Denies any vomiting. Pt states he had frequency and urgency also x 3am.  This rn notices odor of acetone, pt states his fsbs have been normal, last check was last night with reading of 128.

## 2015-03-16 NOTE — ED Notes (Signed)
Patient transported to CT 

## 2015-03-16 NOTE — ED Notes (Signed)
MD at bedside. 

## 2015-03-16 NOTE — ED Provider Notes (Signed)
CSN: [id number]     Arrival date & time 03/16/15  0825 History   First MD Initiated Contact with Patient 03/16/15 [phone]     Chief Complaint  Patient presents with  . Hyperglycemia     (Consider location/radiation/quality/duration/timing/severity/associated sxs/prior Treatment) HPI Comments: Pt. Is a 24 y/o CM with hx of DMI and left sided flank pain for the past 6 hours. He says he awoke at 3 am, and he had colicky pain radiating from his left flank and into his abdomen. He says he had to get up to urinate multiple times since 3 am, and continued to have left flank pain that was somewhat better with certain positions and lying still. He does not have suprapubic pain or pain radiating to his groin. He is nauseated, but no vomiting, fevers, or chills. He says that he has not been around anyone who has been sick. No diarrhea, no constipation. He has not had dysuria, hematuria, or discharge. He has urinary frequency without urgency. He has never had a kidney stone before or a UTI. Upon further questioning regarding his DMI regimen he says he has no provider to prescribe insulin for him as he has not had follow up with an endocrinologist for some time. He was previously seen by Dr. Fransico Michael for peds endocrinology, but did not follow up after transition to adult care. He has no primary care physician either. He is using his mom's insulin, and taking 25 units of lantus at night along with a sliding scale during the day time for his meals. He says he usually requires 10 - 15 units of regular insulin with his meals for the sliding scale, and his glucose usually runs in the 120's.   The history is provided by the patient.    Past Medical History  Diagnosis Date  . 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 Essential hypertension, benign 02/04/2011  . Goiter, unspecified 02/04/2011  . ADD (attention deficit disorder)   . Allergic rhinitis    Past  Surgical History  Procedure Laterality Date  . Tonsillectomy     Family History  Problem Relation Age of Onset  . Cancer Neg Hx   . Lupus Cousin    History  Substance Use Topics  . Smoking status: Former Smoker -- 1.00 packs/day for 1 years    Types: Cigarettes  . Smokeless tobacco: Former Neurosurgeon    Quit date: 06/26/2013  . Alcohol Use: No    Review of Systems  Constitutional: Negative.  Negative for fever, chills, diaphoresis, appetite change, fatigue and unexpected weight change.  HENT: Negative.  Negative for congestion, dental problem, rhinorrhea and sore throat.   Eyes: Negative.  Negative for pain and visual disturbance.  Respiratory: Negative.  Negative for cough, chest tightness, shortness of breath and wheezing.   Cardiovascular: Negative.  Negative for chest pain, palpitations and leg swelling.  Gastrointestinal: Positive for nausea and abdominal pain. Negative for vomiting, diarrhea, constipation and abdominal distention.  Endocrine: Positive for polydipsia and polyuria. Negative for cold intolerance, heat intolerance and polyphagia.  Genitourinary: Positive for frequency and flank pain. Negative for dysuria, urgency, hematuria, decreased urine volume, discharge, penile pain and testicular pain.  Musculoskeletal: Negative.  Negative for myalgias and back pain.  Skin: Negative.  Negative for pallor and rash.  Allergic/Immunologic: Negative.   Neurological: Negative.  Negative for dizziness, weakness, light-headedness and headaches.  Hematological: Negative.   Psychiatric/Behavioral: Negative.       Allergies  Review of patient's allergies indicates no known allergies.  Home Medications   Prior to Admission medications   Medication Sig Start Date End Date Taking? Authorizing Provider  acetaminophen (TYLENOL) 500 MG tablet Take 500 mg by mouth every 6 (six) hours as needed.    Historical Provider, MD  Aspirin-Acetaminophen-Caffeine (GOODY HEADACHE PO) Take 1 tablet  by mouth daily as needed. For headache    Historical Provider, MD  glucose blood test strip 1 each by Other route 2 (two) times daily. Use as instructed 01/03/13   Romero Belling, MD  insulin aspart (NOVOLOG) 100 UNIT/ML injection Inject 4 Units into the skin 3 (three) times daily with meals. 07/10/14   Russella Dar, NP  insulin aspart (NOVOLOG) 100 UNIT/ML injection Please resume previous sliding scale insulin your utilizing prior to admission. Check CBGs before meals and at bedtime 03/16/15   Yolande Jolly, MD  insulin glargine (LANTUS) 100 UNIT/ML injection Inject 25 units daily at bedtime-may decrease to 20 units if low CBGs persist 03/16/15   Yolande Jolly, MD  insulin regular (NOVOLIN R,HUMULIN R) 100 units/mL injection Inject 0.04 mLs (4 Units total) into the skin 3 (three) times daily before meals. 07/10/14   Russella Dar, NP  INSULIN SYRINGE .5CC/28G 28G X 1/2" 0.5 ML MISC Use to administer insulins as prescribed 07/10/14   Russella Dar, NP   BP 97/55 mmHg  Pulse 77  Temp(Src) 98.3 F (36.8 C) (Oral)  Resp 18  Ht 5\' 10"  (1.778 m)  Wt 141 lb 2 oz (64.014 kg)  BMI 20.25 kg/m2  SpO2 9% Physical Exam  Constitutional: He is oriented to person, place, and time. He appears well-developed and well-nourished. No distress.  HENT:  Head: Normocephalic and atraumatic.  Eyes: Conjunctivae and EOM are normal. Pupils are equal, round, and reactive to light.  Neck: Normal range of motion. Neck supple. No thyromegaly present.  Cardiovascular: Normal rate, regular rhythm, normal heart sounds and intact distal pulses.  Exam reveals no gallop and no friction rub.   No murmur heard. Pulmonary/Chest: Effort normal and breath sounds normal. No respiratory distress. He has no wheezes. He has no rales. He exhibits no tenderness.  Abdominal: Soft. Bowel sounds are normal. He exhibits no distension and no mass. There is no hepatosplenomegaly. There is tenderness in the left lower quadrant. There is  no rigidity, no rebound, no guarding, no CVA tenderness, no tenderness at McBurney's point and negative Murphy's sign.  Genitourinary: Testes normal and penis normal. Right testis shows no mass and no tenderness. Left testis shows no mass and no tenderness. Circumcised. No penile tenderness.  Musculoskeletal: Normal range of motion. He exhibits no edema or tenderness.       Arms: Lymphadenopathy:    He has no cervical adenopathy.  Neurological: He is alert and oriented to person, place, and time.  Skin: Skin is warm and dry. No rash noted. He is not diaphoretic. No erythema.  Psychiatric: He has a normal mood and affect. His behavior is normal.    ED Course  Procedures (including critical care time) Labs Review Labs Reviewed  BASIC METABOLIC PANEL - Abnormal; Notable for the following:    Sodium 131 (*)    Chloride 91 (*)    Glucose, Bld 658 (*)    Anion gap 17 (*)    All other components within normal limits  URINALYSIS, ROUTINE W REFLEX MICROSCOPIC (NOT AT [facility]) - Abnormal; Notable for the following:    Specific Gravity, Urine  1.036 (*)    Glucose, UA >1000 (*)    Ketones, ur 40 (*)    All other components within normal limits  CBC WITH DIFFERENTIAL/PLATELET - Abnormal; Notable for the following:    Platelets 446 (*)    Monocytes Relative 2 (*)    Neutro Abs 8.3 (*)    All other components within normal limits  BASIC METABOLIC PANEL - Abnormal; Notable for the following:    Glucose, Bld 213 (*)    Calcium 7.7 (*)    All other components within normal limits  I-STAT VENOUS BLOOD GAS, ED - Abnormal; Notable for the following:    pH, Ven 7.309 (*)    pCO2, Ven 50.4 (*)    pO2, Ven 26.0 (*)    Bicarbonate 25.3 (*)    All other components within normal limits  CBG MONITORING, ED - Abnormal; Notable for the following:    Glucose-Capillary 246 (*)    All other components within normal limits  CBG MONITORING, ED - Abnormal; Notable for the following:    Glucose-Capillary 177  (*)    All other components within normal limits  URINE MICROSCOPIC-ADD ON  BLOOD GAS, VENOUS  KETONES, URINE    Imaging Review Ct Renal Stone Study  03/16/2015   CLINICAL DATA:  Acute onset severe left flank pain at 3 a.m. today. No known injury. Initial encounter.  EXAM: CT ABDOMEN AND PELVIS WITHOUT CONTRAST  TECHNIQUE: Multidetector CT imaging of the abdomen and pelvis was performed following the standard protocol without IV contrast.  COMPARISON:  None.  FINDINGS: The lung bases are clear. No pleural or pericardial effusion is identified.  No left renal or ureteral stones are identified. The left kidney appears normal. There is no hydronephrosis on the right or left. A punctate nonobstructing stone is identified in the midpole of the right kidney.  The gallbladder, liver, spleen, adrenal glands, pancreas and biliary tree are unremarkable.  Urinary bladder, seminal vesicles and prostate gland are all unremarkable. The stomach and small and large bowel appear normal. The appendix is not discretely visualized but no pericecal inflammatory process is seen. There is no lymphadenopathy or fluid.  Imaged bones appear normal.  IMPRESSION: No acute abnormality or finding to explain the patient's symptoms.  Punctate nonobstructing stone midpole right kidney. No other urinary tract stones are identified and there is no hydronephrosis.   Electronically Signed   By: Drusilla Kanner M.D.   On: 03/16/2015 09:32     EKG Interpretation None      MDM   Final diagnoses:  Hyperglycemia due to type 1 diabetes mellitus  Left lower quadrant pain    Pt. Is a 24 y/o CM with DMI here with left sided abdominal pain that initially was consistent with presentation for kidney stone. However, after initial laboratory workup Glucose was found to be 650 with GAP of 17. WBC normal. CT abdomen without evidence of left sided kidney stone. Right side with small renal stone. Pt. With improvement in pain and nausea with  toradol and zofran. Placed on glucostabilizer. U/A pending and urine ketones pending. Pt. Says he gets his insulin from his mother.   Pt. With improving blood glucose on glucostabilizer. 246 > 177. Repeat BMET with resolved anion gap of 6 and chloride restored. VBG with normal pH. Urinalysis with 40 ketones, and glucose but otherwise without evidence of kidney stone or infection. Will give prescription for lantus, novolog, and insulin needles. Referral to [facility] for ongoing primary  care and diabetes management. Safe for discharge to home.   Yolande Jolly, MD 03/16/15 1303  Yolande Jolly, MD 03/16/15 1328  Doug Sou, MD 03/16/15 1721

## 2015-03-24 ENCOUNTER — Other Ambulatory Visit: Payer: Self-pay

## 2015-03-24 ENCOUNTER — Encounter: Payer: Self-pay | Admitting: Family Medicine

## 2015-03-24 ENCOUNTER — Ambulatory Visit: Payer: Self-pay | Attending: Family Medicine | Admitting: Family Medicine

## 2015-03-24 VITALS — BP 110/70 | HR 94 | Temp 98.3°F | Ht 70.0 in | Wt 163.0 lb

## 2015-03-24 DIAGNOSIS — E1065 Type 1 diabetes mellitus with hyperglycemia: Secondary | ICD-10-CM | POA: Insufficient documentation

## 2015-03-24 DIAGNOSIS — Z794 Long term (current) use of insulin: Secondary | ICD-10-CM | POA: Insufficient documentation

## 2015-03-24 LAB — POCT GLYCOSYLATED HEMOGLOBIN (HGB A1C): Hemoglobin A1C: 12

## 2015-03-24 LAB — GLUCOSE, POCT (MANUAL RESULT ENTRY): POC Glucose: 159 mg/dl — AB (ref 70–99)

## 2015-03-24 MED ORDER — INSULIN GLARGINE 100 UNIT/ML ~~LOC~~ SOLN: 25.0000 [IU] | Freq: Every day | SUBCUTANEOUS | Status: DC

## 2015-03-24 MED ORDER — INSULIN PEN NEEDLE 31G X 8 MM MISC: 1.0000 | Freq: Three times a day (TID) | Status: DC

## 2015-03-24 MED ORDER — INSULIN ASPART 100 UNIT/ML ~~LOC~~ SOLN: 0.0000 [IU] | Freq: Three times a day (TID) | SUBCUTANEOUS | Status: DC

## 2015-03-24 NOTE — Progress Notes (Signed)
Type I diabetic here to establish care Her reports he is using his mother's Novolog and Lantus at home and he himself is determining how much to give himself based on his numbers He states he has gained 20 lbs since recent trip to [place] His girlfriend states that his sugars run in the 100's-400's

## 2015-03-24 NOTE — Patient Instructions (Signed)
Diabetes Mellitus and Food It is important for you to manage your blood sugar (glucose) level. Your blood glucose level can be greatly affected by what you eat. Eating healthier foods in the appropriate amounts throughout the day at about the same time each day will help you control your blood glucose level. It can also help slow or prevent worsening of your diabetes mellitus. Healthy eating may even help you improve the level of your blood pressure and reach or maintain a healthy weight.  HOW CAN FOOD AFFECT ME? Carbohydrates Carbohydrates affect your blood glucose level more than any other type of food. Your dietitian will help you determine how many carbohydrates to eat at each meal and teach you how to count carbohydrates. Counting carbohydrates is important to keep your blood glucose at a healthy level, especially if you are using insulin or taking certain medicines for diabetes mellitus. Alcohol Alcohol can cause sudden decreases in blood glucose (hypoglycemia), especially if you use insulin or take certain medicines for diabetes mellitus. Hypoglycemia can be a life-threatening condition. Symptoms of hypoglycemia (sleepiness, dizziness, and disorientation) are similar to symptoms of having too much alcohol.  If your health care provider has given you approval to drink alcohol, do so in moderation and use the following guidelines:  Women should not have more than one drink per day, and men should not have more than two drinks per day. One drink is equal to:  12 oz of beer.  5 oz of wine.  1 oz of hard liquor.  Do not drink on an empty stomach.  Keep yourself hydrated. Have water, diet soda, or unsweetened iced tea.  Regular soda, juice, and other mixers might contain a lot of carbohydrates and should be counted. WHAT FOODS ARE NOT RECOMMENDED? As you make food choices, it is important to remember that all foods are not the same. Some foods have fewer nutrients per serving than other  foods, even though they might have the same number of calories or carbohydrates. It is difficult to get your body what it needs when you eat foods with fewer nutrients. Examples of foods that you should avoid that are high in calories and carbohydrates but low in nutrients include:  Trans fats (most processed foods list trans fats on the Nutrition Facts label).  Regular soda.  Juice.  Candy.  Sweets, such as cake, pie, doughnuts, and cookies.  Fried foods. WHAT FOODS CAN I EAT? Have nutrient-rich foods, which will nourish your body and keep you healthy. The food you should eat also will depend on several factors, including:  The calories you need.  The medicines you take.  Your weight.  Your blood glucose level.  Your blood pressure level.  Your cholesterol level. You also should eat a variety of foods, including:  Protein, such as meat, poultry, fish, tofu, nuts, and seeds (lean animal proteins are best).  Fruits.  Vegetables.  Dairy products, such as milk, cheese, and yogurt (low fat is best).  Breads, grains, pasta, cereal, rice, and beans.  Fats such as olive oil, trans fat-free margarine, canola oil, avocado, and olives. DOES EVERYONE WITH DIABETES MELLITUS HAVE THE SAME MEAL PLAN? Because every person with diabetes mellitus is different, there is not one meal plan that works for everyone. It is very important that you meet with a dietitian who will help you create a meal plan that is just right for you. Document Released: 05/05/2005 Document Revised: 08/13/2013 Document Reviewed: 07/05/2013 [employer] Patient Information 2015 [employer]. This   information is not intended to replace advice given to you by your health care provider. Make sure you discuss any questions you have with your health care provider.  

## 2015-03-24 NOTE — Progress Notes (Signed)
Subjective:    Patient ID: John [name], male    DOB: [birth year], 24 y.o.   MRN: [id number]  HPI John [name] is a 24 year old male with a history of type 1 diabetes mellitus who has been noncompliant with his medications; A1c from 02/2015 was 12.0. He was previously followed by an endocrinologist whom he has not seen in over a year and he tells me 'the endocrinologist give him too much insulin'. He has been using his mother's insulin. He was recently seen at the ED at [facility]: On 03/16/15 with flank pain, was found to be in DKA with a blood glucose of 650, anion gap of 17, ketonuria. CT renal stone study revealed a non obstructing right renal stone, no hydronephrosis; he was placed on IV fluids, insulin, Zofran and Toradol with improvement in symptoms. He was subsequently referred to the clinic for management of his diabetes.  He has no complaints today and has his glucometer with him which reveals blood sugar was swinging from 61 all the way to 347 at different times of the day. 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  Past Medical History  Diagnosis Date  . 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 Essential hypertension, benign 02/04/2011  . Goiter, unspecified 02/04/2011  . ADD (attention deficit disorder)   . Allergic rhinitis     Past Surgical History  Procedure Laterality Date  . Tonsillectomy      History   Social History  . Marital Status: Single    Spouse Name: N/A  . Number of Children: N/A  . Years of Education: N/A   Occupational History  . Not on file.   Social History Main Topics  . Smoking status: Former Smoker -- 1.00 packs/day for 1 years    Types: Cigarettes  . Smokeless tobacco: Former Neurosurgeon    Quit date: 06/26/2013  . Alcohol Use: No  . Drug Use: No  . Sexual Activity: No   Other Topics Concern  . Not  on file   Social History Narrative   Regular exercise-yes    No Known Allergies  Current Outpatient Prescriptions on File Prior to Visit  Medication Sig Dispense Refill  . acetaminophen (TYLENOL) 500 MG tablet Take 500 mg by mouth every 6 (six) hours as needed.    . Aspirin-Acetaminophen-Caffeine (GOODY HEADACHE PO) Take 1 tablet by mouth daily as needed. For headache    . glucose blood test strip 1 each by Other route 2 (two) times daily. Use as instructed    . INSULIN SYRINGE .5CC/28G 28G X 1/2" 0.5 ML MISC Use to administer insulins as prescribed 300 each 0   No current facility-administered medications on file prior to visit.    Review of Systems  General: negative for fever, weight loss, appetite change Eyes: no visual symptoms. ENT: no ear symptoms, no sinus tenderness, no nasal congestion or sore throat. Neck: no pain  Respiratory: no wheezing, shortness of breath, cough Cardiovascular: no chest pain, no dyspnea on exertion, no pedal edema, no orthopnea. Gastrointestinal: no abdominal pain, no diarrhea, no constipation Genito-Urinary: no urinary frequency, no dysuria, no polyuria. Hematologic: no bruising Endocrine: no cold or heat intolerance Neurological: no headaches, no seizures, no tremors Musculoskeletal: no joint pains, no joint swelling Skin: no pruritus, no rash. Psychological: no depression, no anxiety,       Objective:  Filed Vitals:   03/24/15 1517  BP: 110/70  Pulse: 94  Temp: 98.3 F (36.8 C)  Height:  (1.778 m)  Weight: 163 lb (73.936 kg)  SpO2: 99%      Physical Exam  Constitutional: He is oriented to person, place, and time. He appears well-developed and well-nourished.  HENT:  Head: Normocephalic and atraumatic.  Right Ear: External ear normal.  Left Ear: External ear normal.  Eyes: Conjunctivae and EOM are normal. Pupils are equal, round, and reactive to light.  Neck: Normal range of motion. Neck supple. No tracheal deviation  present.  Cardiovascular: Normal rate, regular rhythm and S1 normal.   No murmur heard. Split S2  Pulmonary/Chest: Effort normal and breath sounds normal. No respiratory distress. He has no wheezes. He exhibits no tenderness.  Abdominal: Soft. Bowel sounds are normal. He exhibits no mass. There is no tenderness.  Musculoskeletal: Normal range of motion. He exhibits no edema or tenderness.  Neurological: He is alert and oriented to person, place, and time.  Skin: Skin is warm and dry.  Psychiatric: He has a normal mood and affect.            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 -Continue Lantus at 25 units at bedtime and I have placed him on a NovoLog sliding scale; he knows to reduce Lantus by 2 units in the event of hypoglycemia. -I would love to place him on ACE inhibitor for renal protection but his blood pressure is on the low side. -Referred to a health coach and I will review his blood sugar log at his next office visit. -EKG performed in the clinic due to the presence of the split second heart sound which reveals normal sinus rhythm.

## 2015-04-07 ENCOUNTER — Ambulatory Visit: Payer: Self-pay | Attending: Family Medicine | Admitting: Pharmacist

## 2015-04-07 ENCOUNTER — Encounter: Payer: Self-pay | Admitting: Family Medicine

## 2015-04-07 ENCOUNTER — Ambulatory Visit (HOSPITAL_BASED_OUTPATIENT_CLINIC_OR_DEPARTMENT_OTHER): Payer: Self-pay | Admitting: Family Medicine

## 2015-04-07 VITALS — BP 111/68 | HR 81 | Temp 98.6°F | Ht 70.0 in | Wt 159.0 lb

## 2015-04-07 DIAGNOSIS — Z794 Long term (current) use of insulin: Secondary | ICD-10-CM | POA: Insufficient documentation

## 2015-04-07 DIAGNOSIS — E109 Type 1 diabetes mellitus without complications: Secondary | ICD-10-CM | POA: Insufficient documentation

## 2015-04-07 DIAGNOSIS — Z87891 Personal history of nicotine dependence: Secondary | ICD-10-CM | POA: Insufficient documentation

## 2015-04-07 DIAGNOSIS — I10 Essential (primary) hypertension: Secondary | ICD-10-CM | POA: Insufficient documentation

## 2015-04-07 DIAGNOSIS — E1065 Type 1 diabetes mellitus with hyperglycemia: Secondary | ICD-10-CM

## 2015-04-07 DIAGNOSIS — Z9114 Patient's other noncompliance with medication regimen: Secondary | ICD-10-CM | POA: Insufficient documentation

## 2015-04-07 LAB — GLUCOSE, POCT (MANUAL RESULT ENTRY): POC GLUCOSE: 225 mg/dL — AB (ref 70–99)

## 2015-04-07 MED ORDER — INSULIN GLARGINE 100 UNIT/ML ~~LOC~~ SOLN: 30.0000 [IU] | Freq: Every day | SUBCUTANEOUS | Status: DC

## 2015-04-07 NOTE — Patient Instructions (Signed)
Diabetes Mellitus and Food It is important for you to manage your blood sugar (glucose) level. Your blood glucose level can be greatly affected by what you eat. Eating healthier foods in the appropriate amounts throughout the day at about the same time each day will help you control your blood glucose level. It can also help slow or prevent worsening of your diabetes mellitus. Healthy eating may even help you improve the level of your blood pressure and reach or maintain a healthy weight.  HOW CAN FOOD AFFECT ME? Carbohydrates Carbohydrates affect your blood glucose level more than any other type of food. Your dietitian will help you determine how many carbohydrates to eat at each meal and teach you how to count carbohydrates. Counting carbohydrates is important to keep your blood glucose at a healthy level, especially if you are using insulin or taking certain medicines for diabetes mellitus. Alcohol Alcohol can cause sudden decreases in blood glucose (hypoglycemia), especially if you use insulin or take certain medicines for diabetes mellitus. Hypoglycemia can be a life-threatening condition. Symptoms of hypoglycemia (sleepiness, dizziness, and disorientation) are similar to symptoms of having too much alcohol.  If your health care provider has given you approval to drink alcohol, do so in moderation and use the following guidelines:  Women should not have more than one drink per day, and men should not have more than two drinks per day. One drink is equal to:  12 oz of beer.  5 oz of wine.  1 oz of hard liquor.  Do not drink on an empty stomach.  Keep yourself hydrated. Have water, diet soda, or unsweetened iced tea.  Regular soda, juice, and other mixers might contain a lot of carbohydrates and should be counted. WHAT FOODS ARE NOT RECOMMENDED? As you make food choices, it is important to remember that all foods are not the same. Some foods have fewer nutrients per serving than other  foods, even though they might have the same number of calories or carbohydrates. It is difficult to get your body what it needs when you eat foods with fewer nutrients. Examples of foods that you should avoid that are high in calories and carbohydrates but low in nutrients include:  Trans fats (most processed foods list trans fats on the Nutrition Facts label).  Regular soda.  Juice.  Candy.  Sweets, such as cake, pie, doughnuts, and cookies.  Fried foods. WHAT FOODS CAN I EAT? Have nutrient-rich foods, which will nourish your body and keep you healthy. The food you should eat also will depend on several factors, including:  The calories you need.  The medicines you take.  Your weight.  Your blood glucose level.  Your blood pressure level.  Your cholesterol level. You also should eat a variety of foods, including:  Protein, such as meat, poultry, fish, tofu, nuts, and seeds (lean animal proteins are best).  Fruits.  Vegetables.  Dairy products, such as milk, cheese, and yogurt (low fat is best).  Breads, grains, pasta, cereal, rice, and beans.  Fats such as olive oil, trans fat-free margarine, canola oil, avocado, and olives. DOES EVERYONE WITH DIABETES MELLITUS HAVE THE SAME MEAL PLAN? Because every person with diabetes mellitus is different, there is not one meal plan that works for everyone. It is very important that you meet with a dietitian who will help you create a meal plan that is just right for you. Document Released: 05/05/2005 Document Revised: 08/13/2013 Document Reviewed: 07/05/2013 [employer] Patient Information 2015 [employer]. This   information is not intended to replace advice given to you by your health care provider. Make sure you discuss any questions you have with your health care provider.  

## 2015-04-07 NOTE — Progress Notes (Signed)
Patient here to follow up on his type 1 DM He has been using his mother's insulin and has not been able to pick up his own supply He will meet with the pharmacist after his appointment today to receive health coaching on his DM Patient reports no pain

## 2015-04-07 NOTE — Progress Notes (Signed)
Subjective:    Patient ID: John [name], male    DOB: 1990-08-31, 24 y.o.   MRN: [id number]  HPI John [name] is a 24 year old male with a history of type 1 diabetes mellitus, A1c 12.0 from 02/2015 who was previously followed by [facility] but lost his insurance.  He has been unable to obtain his Lantus and NovoLog from the pharmacy due to financial constraints but has been using his mom's medications. His blood sugar log reviewed continues to reveal severe fluctuations with no particular pattern. His postprandial sugars are in the 197 to 340 range and he had a hyperglycemia of 548 at about 7 PM 2 days ago. Of note he does have some hypoglycemia shortly before lunchtime with the values dropping down to the 60s and 70s but he denies sweating, palpitations. 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  He is not up-to-date on annual eye exams and is not currently on an ACE inhibitor due to the fact that his blood pressures run low.  Past Medical History  Diagnosis Date  . 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 Essential hypertension, benign 02/04/2011  . Goiter, unspecified 02/04/2011  . ADD (attention deficit disorder)   . Allergic rhinitis     Past Surgical History  Procedure Laterality Date  . Tonsillectomy      Social History   Social History  . Marital Status: Single    Spouse Name: N/A  . Number of Children: N/A  . Years of Education: N/A   Occupational History  . Not on file.   Social History Main Topics  . Smoking status: Former Smoker -- 1.00 packs/day for 1 years    Types: Cigarettes  . Smokeless tobacco: Former Neurosurgeon    Quit date: 06/26/2013  . Alcohol Use: No  . Drug Use: No  . Sexual Activity: No   Other Topics  Concern  . Not on file   Social History Narrative   Regular exercise-yes    No Known Allergies    Review of Systems  Constitutional: Negative for activity change and appetite change.  HENT: Negative for sinus pressure and sore throat.   Eyes: Negative for visual disturbance.  Respiratory: Negative for cough, chest tightness and shortness of breath.   Cardiovascular: Negative for chest pain and leg swelling.  Gastrointestinal: Negative for abdominal pain, diarrhea, constipation and abdominal distention.  Endocrine: Negative.   Genitourinary: Negative for dysuria.  Musculoskeletal: Negative for myalgias and joint swelling.  Skin: Negative for rash.  Allergic/Immunologic: Negative.   Neurological: Negative for weakness, light-headedness and numbness.  Psychiatric/Behavioral: Negative for suicidal ideas and dysphoric mood.       Objective: Filed Vitals:   04/07/15 1429  BP: 111/68  Pulse: 81  Temp: 98.6 F (37 C)  Height: 5\' 10"  (1.778 m)  Weight: 159 lb (72.122 kg)  SpO2: 97%      Physical Exam  Constitutional: He is oriented to person, place, and time. He appears well-developed and well-nourished.  Cardiovascular: Normal rate, normal heart sounds and intact distal pulses.   No murmur heard. Pulmonary/Chest: Effort normal and breath sounds normal. He has no wheezes. He has no rales. He exhibits no tenderness.  Abdominal: Soft. Bowel sounds are normal. He exhibits no distension and  no mass. There is no tenderness.  Musculoskeletal: Normal range of motion.  Feet: Normal appearance, normal pulses, normal sensation, normal monofilament testing. no calluses and sole of feet.  Neurological: He is alert and oriented to person, place, and time.         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 -Increase Lantus from 25 units to 30 units at bedtime and continue NovoLog sliding scale ; he knows to reduce Lantus by 2 units in the event of hypoglycemia. Blood sugar log will be reviewed at his next office visit -I would love to place him on ACE inhibitor for renal protection but his blood pressure is on the low side. -Scheduled to see the clinical pharmacist for diabetic education. -Foot exam performed today and he has been advised to schedule an eye exam with an optometrist or ophthalmologist

## 2015-04-29 ENCOUNTER — Ambulatory Visit: Payer: Self-pay | Attending: Family Medicine | Admitting: Family Medicine

## 2015-04-29 ENCOUNTER — Ambulatory Visit (HOSPITAL_BASED_OUTPATIENT_CLINIC_OR_DEPARTMENT_OTHER): Payer: Self-pay | Admitting: Pharmacist

## 2015-04-29 ENCOUNTER — Encounter: Payer: Self-pay | Admitting: Family Medicine

## 2015-04-29 VITALS — BP 114/76 | HR 104 | Temp 98.1°F | Ht 70.0 in | Wt 158.0 lb

## 2015-04-29 DIAGNOSIS — E1065 Type 1 diabetes mellitus with hyperglycemia: Secondary | ICD-10-CM | POA: Insufficient documentation

## 2015-04-29 DIAGNOSIS — Z794 Long term (current) use of insulin: Secondary | ICD-10-CM | POA: Insufficient documentation

## 2015-04-29 LAB — GLUCOSE, POCT (MANUAL RESULT ENTRY): POC GLUCOSE: 178 mg/dL — AB (ref 70–99)

## 2015-04-29 MED ORDER — LISINOPRIL 2.5 MG PO TABS: 5.0000 mg | ORAL_TABLET | Freq: Every day | ORAL | Status: DC

## 2015-04-29 NOTE — Patient Instructions (Signed)
Thank you for talking with me today!  Please come back and see me if you want to discuss carb counting or your insulin!  Diabetes Mellitus and Food It is important for you to manage your blood sugar (glucose) level. Your blood glucose level can be greatly affected by what you eat. Eating healthier foods in the appropriate amounts throughout the day at about the same time each day will help you control your blood glucose level. It can also help slow or prevent worsening of your diabetes mellitus. Healthy eating may even help you improve the level of your blood pressure and reach or maintain a healthy weight.  HOW CAN FOOD AFFECT ME? Carbohydrates Carbohydrates affect your blood glucose level more than any other type of food. Your dietitian will help you determine how many carbohydrates to eat at each meal and teach you how to count carbohydrates. Counting carbohydrates is important to keep your blood glucose at a healthy level, especially if you are using insulin or taking certain medicines for diabetes mellitus. Alcohol Alcohol can cause sudden decreases in blood glucose (hypoglycemia), especially if you use insulin or take certain medicines for diabetes mellitus. Hypoglycemia can be a life-threatening condition. Symptoms of hypoglycemia (sleepiness, dizziness, and disorientation) are similar to symptoms of having too much alcohol.  If your health care provider has given you approval to drink alcohol, do so in moderation and use the following guidelines:  Women should not have more than one drink per day, and men should not have more than two drinks per day. One drink is equal to:  12 oz of beer.  5 oz of wine.  1 oz of hard liquor.  Do not drink on an empty stomach.  Keep yourself hydrated. Have water, diet soda, or unsweetened iced tea.  Regular soda, juice, and other mixers might contain a lot of carbohydrates and should be counted. WHAT FOODS ARE NOT RECOMMENDED? As you make food  choices, it is important to remember that all foods are not the same. Some foods have fewer nutrients per serving than other foods, even though they might have the same number of calories or carbohydrates. It is difficult to get your body what it needs when you eat foods with fewer nutrients. Examples of foods that you should avoid that are high in calories and carbohydrates but low in nutrients include:  Trans fats (most processed foods list trans fats on the Nutrition Facts label).  Regular soda.  Juice.  Candy.  Sweets, such as cake, pie, doughnuts, and cookies.  Fried foods. WHAT FOODS CAN I EAT? Have nutrient-rich foods, which will nourish your body and keep you healthy. The food you should eat also will depend on several factors, including:  The calories you need.  The medicines you take.  Your weight.  Your blood glucose level.  Your blood pressure level.  Your cholesterol level. You also should eat a variety of foods, including:  Protein, such as meat, poultry, fish, tofu, nuts, and seeds (lean animal proteins are best).  Fruits.  Vegetables.  Dairy products, such as milk, cheese, and yogurt (low fat is best).  Breads, grains, pasta, cereal, rice, and beans.  Fats such as olive oil, trans fat-free margarine, canola oil, avocado, and olives. DOES EVERYONE WITH DIABETES MELLITUS HAVE THE SAME MEAL PLAN? Because every person with diabetes mellitus is different, there is not one meal plan that works for everyone. It is very important that you meet with a dietitian who will help you create  a meal plan that is just right for you. Document Released: 05/05/2005 Document Revised: 08/13/2013 Document Reviewed: 07/05/2013 [facility] Patient Information 2015 [place], [place]. This information is not intended to replace advice given to you by your health care provider. Make sure you discuss any questions you have with your health care provider. Basic Carbohydrate Counting for  Diabetes Mellitus Carbohydrate counting is a method for keeping track of the amount of carbohydrates you eat. Eating carbohydrates naturally increases the level of sugar (glucose) in your blood, so it is important for you to know the amount that is okay for you to have in every meal. Carbohydrate counting helps keep the level of glucose in your blood within normal limits. The amount of carbohydrates allowed is different for every person. A dietitian can help you calculate the amount that is right for you. Once you know the amount of carbohydrates you can have, you can count the carbohydrates in the foods you want to eat. Carbohydrates are found in the following foods:  Grains, such as breads and cereals.  Dried beans and soy products.  Starchy vegetables, such as potatoes, peas, and corn.  Fruit and fruit juices.  Milk and yogurt.  Sweets and snack foods, such as cake, cookies, candy, chips, soft drinks, and fruit drinks. CARBOHYDRATE COUNTING There are two ways to count the carbohydrates in your food. You can use either of the methods or a combination of both. Reading the "Nutrition Facts" on Packaged Food The "Nutrition Facts" is an area that is included on the labels of almost all packaged food and beverages in the [place]. It includes the serving size of that food or beverage and information about the nutrients in each serving of the food, including the grams (g) of carbohydrate per serving.  Decide the number of servings of this food or beverage that you will be able to eat or drink. Multiply that number of servings by the number of grams of carbohydrate that is listed on the label for that serving. The total will be the amount of carbohydrates you will be having when you eat or drink this food or beverage. Learning Standard Serving Sizes of Food When you eat food that is not packaged or does not include "Nutrition Facts" on the label, you need to measure the servings in order to  count the amount of carbohydrates.A serving of most carbohydrate-rich foods contains about 15 g of carbohydrates. The following list includes serving sizes of carbohydrate-rich foods that provide 15 g ofcarbohydrate per serving:   1 slice of bread (1 oz) or 1 six-inch tortilla.    of a hamburger bun or English muffin.  4-6 crackers.   cup unsweetened dry cereal.    cup hot cereal.   cup rice or pasta.    cup mashed potatoes or  of a large baked potato.  1 cup fresh fruit or one small piece of fruit.    cup canned or frozen fruit or fruit juice.  1 cup milk.   cup plain fat-free yogurt or yogurt sweetened with artificial sweeteners.   cup cooked dried beans or starchy vegetable, such as peas, corn, or potatoes.  Decide the number of standard-size servings that you will eat. Multiply that number of servings by 15 (the grams of carbohydrates in that serving). For example, if you eat 2 cups of strawberries, you will have eaten 2 servings and 30 g of carbohydrates (2 servings x 15 g = 30 g). For foods such as soups and casseroles,  in which more than one food is mixed in, you will need to count the carbohydrates in each food that is included. EXAMPLE OF CARBOHYDRATE COUNTING Sample Dinner  3 oz chicken breast.   cup of brown rice.   cup of corn.  1 cup milk.   1 cup strawberries with sugar-free whipped topping.  Carbohydrate Calculation Step 1: Identify the foods that contain carbohydrates:   Rice.   Corn.   Milk.   Strawberries. Step 2:Calculate the number of servings eaten of each:   2 servings of rice.   1 serving of corn.   1 serving of milk.   1 serving of strawberries. Step 3: Multiply each of those number of servings by 15 g:   2 servings of rice x 15 g = 30 g.   1 serving of corn x 15 g = 15 g.   1 serving of milk x 15 g = 15 g.   1 serving of strawberries x 15 g = 15 g. Step 4: Add together all of the amounts to find  the total grams of carbohydrates eaten: 30 g + 15 g + 15 g + 15 g = 75 g. Document Released: 08/08/2005 Document Revised: 12/23/2013 Document Reviewed: 07/05/2013 [facility] Patient Information 2015 [place], [place]. This information is not intended to replace advice given to you by your health care provider. Make sure you discuss any questions you have with your health care provider.

## 2015-04-29 NOTE — Progress Notes (Signed)
S:    Patient seen today by Dr. Venetia Night for diabetes follow up. She asked if I would see him and provide diabetes education.      Patient reports adherence with medications. Current diabetes medications include Lantus 30 units daily and Novolog slide scale. He reports that since his hospitalization he has been really good about taking his insulin and not missing any doses.  Patient reports hypoglycemic events. He reports that they occur less frequently than they used to but they  Patient reported dietary habits: patient is starting to learn what foods will increase his blood sugar and is interested in carb counting.   Patient reported exercise habits: patient has a strenuous job Aeronautical engineer    O:  . Lab Results  Component Value Date   HGBA1C 12.0 03/24/2015     Reported home readings: 70s-400s   A/P: Type 1 Diabetes currently uncontrolled based on A1c of 12.   Reports hypoglycemic events and is able to verbalize appropriate hypoglycemia management plan.  Reports adherence with medication. Control is suboptimal due to dietary indiscretion and noncompliance with insulin in the past. Patient to continue Lantus 30 units daily and Novolog sliding scale. Provided information on basic carb counting, the plate method, and a brochure about the [facility]. I also provided a booklet on meal planning for patients with diabetes. Patient verbalized understanding of education. Offered for patient to follow up with me if he had questions about carb counting or his medications before he sees Dr. Venetia Night in a month. If patient still has erratic blood glucose numbers in a month, would recommend that patient bring his meter or a blood glucose log to clinic (to see Amao or myself) so that we can fine tune his insulin dosing.   Next A1C anticipated November 2016.  Written patient instructions provided.  Follow up in Pharmacist Clinic Visit as needed. Total time in face to face counseling  20 minutes.    Juanita Craver, PharmD, BCPS, CPP Clinical Pharmacist Practitioner  [facility] [phone]

## 2015-04-29 NOTE — Progress Notes (Signed)
Patient is here for follow up on his type 1 DM He reports no issues and says he has been doing well His CBG is 178 today

## 2015-04-29 NOTE — Patient Instructions (Signed)
Diabetes Mellitus and Food It is important for you to manage your blood sugar (glucose) level. Your blood glucose level can be greatly affected by what you eat. Eating healthier foods in the appropriate amounts throughout the day at about the same time each day will help you control your blood glucose level. It can also help slow or prevent worsening of your diabetes mellitus. Healthy eating may even help you improve the level of your blood pressure and reach or maintain a healthy weight.  HOW CAN FOOD AFFECT ME? Carbohydrates Carbohydrates affect your blood glucose level more than any other type of food. Your dietitian will help you determine how many carbohydrates to eat at each meal and teach you how to count carbohydrates. Counting carbohydrates is important to keep your blood glucose at a healthy level, especially if you are using insulin or taking certain medicines for diabetes mellitus. Alcohol Alcohol can cause sudden decreases in blood glucose (hypoglycemia), especially if you use insulin or take certain medicines for diabetes mellitus. Hypoglycemia can be a life-threatening condition. Symptoms of hypoglycemia (sleepiness, dizziness, and disorientation) are similar to symptoms of having too much alcohol.  If your health care provider has given you approval to drink alcohol, do so in moderation and use the following guidelines:  Women should not have more than one drink per day, and men should not have more than two drinks per day. One drink is equal to:  12 oz of beer.  5 oz of wine.  1 oz of hard liquor.  Do not drink on an empty stomach.  Keep yourself hydrated. Have water, diet soda, or unsweetened iced tea.  Regular soda, juice, and other mixers might contain a lot of carbohydrates and should be counted. WHAT FOODS ARE NOT RECOMMENDED? As you make food choices, it is important to remember that all foods are not the same. Some foods have fewer nutrients per serving than other  foods, even though they might have the same number of calories or carbohydrates. It is difficult to get your body what it needs when you eat foods with fewer nutrients. Examples of foods that you should avoid that are high in calories and carbohydrates but low in nutrients include:  Trans fats (most processed foods list trans fats on the Nutrition Facts label).  Regular soda.  Juice.  Candy.  Sweets, such as cake, pie, doughnuts, and cookies.  Fried foods. WHAT FOODS CAN I EAT? Have nutrient-rich foods, which will nourish your body and keep you healthy. The food you should eat also will depend on several factors, including:  The calories you need.  The medicines you take.  Your weight.  Your blood glucose level.  Your blood pressure level.  Your cholesterol level. You also should eat a variety of foods, including:  Protein, such as meat, poultry, fish, tofu, nuts, and seeds (lean animal proteins are best).  Fruits.  Vegetables.  Dairy products, such as milk, cheese, and yogurt (low fat is best).  Breads, grains, pasta, cereal, rice, and beans.  Fats such as olive oil, trans fat-free margarine, canola oil, avocado, and olives. DOES EVERYONE WITH DIABETES MELLITUS HAVE THE SAME MEAL PLAN? Because every person with diabetes mellitus is different, there is not one meal plan that works for everyone. It is very important that you meet with a dietitian who will help you create a meal plan that is just right for you. Document Released: 05/05/2005 Document Revised: 08/13/2013 Document Reviewed: 07/05/2013 [employer] Patient Information 2015 [employer]. This   information is not intended to replace advice given to you by your health care provider. Make sure you discuss any questions you have with your health care provider.  

## 2015-04-29 NOTE — Progress Notes (Signed)
Subjective:    Patient ID: John [name], male    DOB: December 01, 1990, 24 y.o.   MRN: [id number]  HPI Sharief Wainwright is a 24 year old male with a history of type 1 diabetes mellitus, A1c 12.0 from 02/2015 who was previously followed [facility] but lost his insurance.  He has been unable to obtain his Lantus and NovoLog from the pharmacy due to financial constraints but has been using his mom's medications. He is not here with his blood sugar log but reports that the previous severe fluctuations in blood sugars have improved. He previously had postprandial sugars are in the 197 to 340 range and he had a hyperglycemia of 548 at about 7 PM but now he reports that fasting sugars are in the 120s and his highest sugar has been 349 since his last visit ever since his Lantus was increased to 30 units.. Of note he does have some hypoglycemia shortly before lunchtime with the values dropping down to the 60s and 70s but he denies sweating, palpitations. 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 He has cut back significantly on his late night snacking and is working on eating more healthy meals  Complains of being pulled over by a state trooper was informed that his license was medically denied and he is seeking help regarding this as he is unsure if his previous PCP had reported noncompliance on his DMV medical form.  Past Medical History  Diagnosis Date  . 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 Essential hypertension, benign 02/04/2011  . Goiter, unspecified 02/04/2011  . ADD (attention deficit disorder)   . Allergic rhinitis     Past Surgical History  Procedure Laterality Date  . Tonsillectomy      Social History   Social History  . Marital Status:  Single    Spouse Name: N/A  . Number of Children: N/A  . Years of Education: N/A   Occupational History  . Not on file.   Social History Main Topics  . Smoking status: Former Smoker -- 1.00 packs/day for 1 years    Types: Cigarettes  . Smokeless tobacco: Former Neurosurgeon    Quit date: 06/26/2013  . Alcohol Use: No  . Drug Use: No  . Sexual Activity: Not on file   Other Topics Concern  . Not on file   Social History Narrative   Regular exercise-yes    No Known Allergies  Current Outpatient Prescriptions on File Prior to Visit  Medication Sig Dispense Refill  . acetaminophen (TYLENOL) 500 MG tablet Take 500 mg by mouth every 6 (six) hours as needed.    . Aspirin-Acetaminophen-Caffeine (GOODY HEADACHE PO) Take 1 tablet by mouth daily as needed. For headache    . glucose blood test strip 1 each by Other route 2 (two) times daily. Use as instructed    . insulin aspart (NOVOLOG) 100 UNIT/ML injection Inject 0-12 Units into the skin 3 (three) times daily with meals. As per sliding scale. 10 mL 3  . insulin glargine (LANTUS) 100 UNIT/ML injection Inject 0.3 mLs (30 Units total) into the skin at bedtime. 10 mL 2  . Insulin Pen Needle 31G X 8 MM MISC 1 each by Does not apply route 4 (four) times daily -  with meals and at bedtime. 100 each 5  . INSULIN SYRINGE .5CC/28G 28G X 1/2" 0.5 ML MISC Use to administer insulins as prescribed 300 each 0   No current facility-administered medications on file prior to visit.     Review of Systems  Constitutional: Negative for activity change and appetite change.  HENT: Negative for sinus pressure and sore throat.   Respiratory: Negative for chest tightness, shortness of breath and wheezing.   Gastrointestinal: Negative for abdominal pain, constipation and abdominal distention.  Genitourinary: Negative.   Musculoskeletal: Negative.   Psychiatric/Behavioral: Negative for behavioral problems and dysphoric mood.       Objective: Filed Vitals:    04/29/15 1605  BP: 114/76  Pulse: 104  Temp: 98.1 F (36.7 C)  Height: 5\' 10"  (1.778 m)  Weight: 158 lb (71.668 kg)  SpO2: 95%      Physical Exam  Constitutional: He is oriented to person, place, and time. He appears well-developed and well-nourished.  Cardiovascular: Normal heart sounds and intact distal pulses.  Tachycardia present.   No murmur heard. Pulmonary/Chest: Effort normal and breath sounds normal. He has no wheezes. He has no rales. He exhibits no tenderness.  Abdominal: Soft. Bowel sounds are normal. He exhibits no distension and no mass. There is no tenderness.  Musculoskeletal: Normal range of motion.  Neurological: He is alert and oriented to person, place, and time.         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 -Continue Lantus 30 units at bedtime and continue NovoLog sliding scale ; he knows to reduce Lantus by 2 units in the event of hypoglycemia. Blood sugar log will be reviewed at his next office visit -Placed on ACE inhibitor for renal protection and side effects discussed  -Scheduled to see the clinical pharmacist for diabetic education.  I have informed him to bring his DMV form at his next office visit and I would be happy to complete it but will indicate his A1c on 8 and the fact that he is working on improving his sugars and is more compliant with his medications.

## 2015-08-26 ENCOUNTER — Emergency Department (HOSPITAL_BASED_OUTPATIENT_CLINIC_OR_DEPARTMENT_OTHER)
Admission: EM | Admit: 2015-08-26 | Discharge: 2015-08-27 | Disposition: A | Discharge status: Home / Self Care | Payer: Self-pay | Attending: Emergency Medicine | Admitting: Emergency Medicine

## 2015-08-26 ENCOUNTER — Encounter (HOSPITAL_BASED_OUTPATIENT_CLINIC_OR_DEPARTMENT_OTHER): Payer: Self-pay | Admitting: Emergency Medicine

## 2015-08-26 DIAGNOSIS — Z79899 Other long term (current) drug therapy: Secondary | ICD-10-CM | POA: Insufficient documentation

## 2015-08-26 DIAGNOSIS — R739 Hyperglycemia, unspecified: Secondary | ICD-10-CM

## 2015-08-26 DIAGNOSIS — I10 Essential (primary) hypertension: Secondary | ICD-10-CM | POA: Insufficient documentation

## 2015-08-26 DIAGNOSIS — Z7982 Long term (current) use of aspirin: Secondary | ICD-10-CM | POA: Insufficient documentation

## 2015-08-26 DIAGNOSIS — Z87891 Personal history of nicotine dependence: Secondary | ICD-10-CM | POA: Insufficient documentation

## 2015-08-26 DIAGNOSIS — Z794 Long term (current) use of insulin: Secondary | ICD-10-CM | POA: Insufficient documentation

## 2015-08-26 DIAGNOSIS — E1065 Type 1 diabetes mellitus with hyperglycemia: Secondary | ICD-10-CM | POA: Insufficient documentation

## 2015-08-26 DIAGNOSIS — Z8659 Personal history of other mental and behavioral disorders: Secondary | ICD-10-CM | POA: Insufficient documentation

## 2015-08-26 DIAGNOSIS — Z8639 Personal history of other endocrine, nutritional and metabolic disease: Secondary | ICD-10-CM | POA: Insufficient documentation

## 2015-08-26 DIAGNOSIS — R197 Diarrhea, unspecified: Secondary | ICD-10-CM | POA: Insufficient documentation

## 2015-08-26 LAB — BASIC METABOLIC PANEL
Anion gap: 14 (ref 5–15)
BUN: 25 mg/dL — AB (ref 6–20)
CALCIUM: 9.3 mg/dL (ref 8.9–10.3)
CHLORIDE: 95 mmol/L — AB (ref 101–111)
CO2: 22 mmol/L (ref 22–32)
CREATININE: 0.98 mg/dL (ref 0.61–1.24)
GFR calc non Af Amer: >60 mL/min (ref >60)
Glucose, Bld: 630 mg/dL (ref 65–99)
Potassium: 4.5 mmol/L (ref 3.5–5.1)
SODIUM: 131 mmol/L — AB (ref 135–145)

## 2015-08-26 LAB — CBC
HCT: 46.1 % (ref 39.0–52.0)
Hemoglobin: 16 g/dL (ref 13.0–17.0)
MCH: 28.5 pg (ref 26.0–34.0)
MCHC: 34.7 g/dL (ref 30.0–36.0)
MCV: 82.2 fL (ref 78.0–100.0)
PLATELETS: 420 10*3/uL — AB (ref 150–400)
RBC: 5.61 MIL/uL (ref 4.22–5.81)
RDW: 12.1 % (ref 11.5–15.5)
WBC: 15.1 10*3/uL — AB (ref 4.0–10.5)

## 2015-08-26 LAB — CBG MONITORING, ED
GLUCOSE-CAPILLARY: 430 mg/dL — AB (ref 65–99)
Glucose-Capillary: >600 mg/dL (ref 65–99)

## 2015-08-26 MED ORDER — SODIUM CHLORIDE 0.9 % IV BOLUS (SEPSIS)
1000.0000 mL | Freq: Once | INTRAVENOUS | Status: AC
  Administered 2015-08-26: 1000 mL via INTRAVENOUS

## 2015-08-26 MED ORDER — INSULIN REGULAR HUMAN 100 UNIT/ML IJ SOLN
8.0000 [IU] | Freq: Once | INTRAMUSCULAR | Status: AC
  Administered 2015-08-26: 8 [IU] via INTRAVENOUS
  Filled 2015-08-26: qty 1

## 2015-08-26 MED ORDER — ONDANSETRON HCL 4 MG/2ML IJ SOLN
4.0000 mg | Freq: Once | INTRAMUSCULAR | Status: AC
  Administered 2015-08-26: 4 mg via INTRAVENOUS
  Filled 2015-08-26: qty 2

## 2015-08-26 MED ORDER — INSULIN REGULAR HUMAN 100 UNIT/ML IJ SOLN
4.0000 [IU] | Freq: Once | INTRAMUSCULAR | Status: AC
  Administered 2015-08-26: 4 [IU] via INTRAVENOUS
  Filled 2015-08-26: qty 1

## 2015-08-26 NOTE — ED Provider Notes (Signed)
CSN: [id number]     Arrival date & time 08/26/15  2205 History  By signing my name below, I, Jarvis Morgan, attest that this documentation has been prepared under the direction and in the presence of Alin Chavira, MD. Electronically Signed: Jarvis Morgan, ED Scribe. 08/26/2015. 11:09 PM.    Chief Complaint  Patient presents with  . Hyperglycemia   Patient is a 25 y.o. male presenting with hyperglycemia. The history is provided by the patient. No language interpreter was used.  Hyperglycemia Blood sugar level PTA:  Unknown Severity:  Moderate Onset quality:  Gradual Duration:  2 hours Timing:  Intermittent Progression:  Unchanged Chronicity:  Recurrent Diabetes status:  Controlled with insulin Current diabetic therapy:  Insulin Time since last antidiabetic medication:  10 hours Context: not change in medication, not insulin pump use, not noncompliance, not recent change in diet and not recent illness   Relieved by:  Nothing Ineffective treatments:  None tried Associated symptoms: vomiting   Risk factors: hx of DKA     HPI Comments: John [name] is a 25 y.o. male with a h/o type I DM who presents to the Emergency Department for evaluation of elevated blood sugars around 2 hours ago. He reports associated emesis that occurred around 9 PM tonight and 1 episode of diarrhea. His last dose of medication was around 1:00 PM today, around 10 hours ago. He notes he was hospitalized for DKA last year. He has concern because he is having similar symptoms to when he was previously in DKA. He has not had his A1C checked in over 6 months. He endorses he is compliant with his insulin medication and takes each dose as prescribed. He notes his previous doctor thought he was having elevated BS due to the type of insulin he was using. He states he has always been on insulin since dx. Pt denies any other associated symptoms at this time.    Past Medical History  Diagnosis Date  . 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 Essential hypertension, benign 02/04/2011  . Goiter, unspecified 02/04/2011  . ADD (attention deficit disorder)   . Allergic rhinitis    Past Surgical History  Procedure Laterality Date  . Tonsillectomy     Family History  Problem Relation Age of Onset  . Cancer Neg Hx   . Lupus Cousin    Social History  Substance Use Topics  . Smoking status: Former Smoker -- 1.00 packs/day for 1 years    Types: Cigarettes  . Smokeless tobacco: Former Neurosurgeon    Quit date: 06/26/2013  . Alcohol Use: No    Review of Systems  Gastrointestinal: Positive for vomiting and diarrhea.  All other systems reviewed and are negative.     Allergies  Review of patient's allergies indicates no known allergies.  Home Medications   Prior to Admission medications   Medication Sig Start Date End Date Taking? Authorizing Provider  acetaminophen (TYLENOL) 500 MG tablet Take 500 mg by mouth every 6 (six) hours as needed.    Historical Provider, MD  Aspirin-Acetaminophen-Caffeine (GOODY HEADACHE PO) Take 1 tablet by mouth daily as needed. For headache    Historical Provider, MD  glucose blood test strip 1 each by Other route 2 (two) times daily. Use as instructed 01/03/13   Romero Belling, MD  insulin aspart (NOVOLOG) 100 UNIT/ML injection Inject 0-12 Units into the skin 3 (three) times daily with meals. As per sliding scale. 03/24/15  Jaclyn ShaggyEnobong Amao, MD  insulin glargine (LANTUS) 100 UNIT/ML injection Inject 0.3 mLs (30 Units total) into the skin at bedtime. 04/07/15   Jaclyn ShaggyEnobong Amao, MD  Insulin Pen Needle 31G X 8 MM MISC 1 each by Does not apply route 4 (four) times daily -  with meals and at bedtime. 03/24/15   Jaclyn ShaggyEnobong Amao, MD  INSULIN SYRINGE .5CC/28G 28G X 1/2" 0.5 ML MISC Use to administer insulins as prescribed 07/10/14   Russella DarAllison L Ellis, NP  lisinopril (PRINIVIL,ZESTRIL) 2.5 MG tablet Take 2 tablets (5 mg total) by mouth daily. 04/29/15    Jaclyn ShaggyEnobong Amao, MD   Triage Vitals: BP 122/70 mmHg  Pulse 101  Temp(Src) 98.5 F (36.9 C) (Oral)  Resp 20  Ht 5\' 10"  (1.778 m)  Wt 158 lb (71.668 kg)  BMI 22.67 kg/m2  SpO2 99%  Physical Exam  Constitutional: He is oriented to person, place, and time. He appears well-developed and well-nourished. No distress.  HENT:  Head: Normocephalic and atraumatic.  Nose: Nose normal.  Mouth/Throat: Uvula is midline, oropharynx is clear and moist and mucous membranes are normal. No oropharyngeal exudate.  Eyes: Conjunctivae and EOM are normal. Pupils are equal, round, and reactive to light.  Neck: Neck supple. No tracheal deviation present.  Cardiovascular: Normal rate, regular rhythm and normal heart sounds.   No bruits  Pulmonary/Chest: Effort normal and breath sounds normal. No respiratory distress.  No stridor  Abdominal: Soft. Bowel sounds are normal. He exhibits no mass. There is no tenderness. There is no rebound and no guarding.  Musculoskeletal: Normal range of motion.  Neurological: He is alert and oriented to person, place, and time. He has normal reflexes.  Skin: Skin is warm and dry.  Psychiatric: He has a normal mood and affect. His behavior is normal.  Nursing note and vitals reviewed.   ED Course  Procedures (including critical care time) Labs Review Labs Reviewed  BASIC METABOLIC PANEL - Abnormal; Notable for the following:    Sodium 131 (*)    Chloride 95 (*)    Glucose, Bld 630 (*)    BUN 25 (*)    All other components within normal limits  CBC - Abnormal; Notable for the following:    WBC 15.1 (*)    Platelets 420 (*)    All other components within normal limits  CBG MONITORING, ED - Abnormal; Notable for the following:    Glucose-Capillary >600 (*)    All other components within normal limits  URINALYSIS, ROUTINE W REFLEX MICROSCOPIC (NOT AT [facility])    Imaging Review No results found. I have personally reviewed and evaluated these images and lab results as  part of my medical decision-making.   EKG Interpretation None      MDM   Final diagnoses:  None    Adhere to your diabetic diet, and take your insulin as directed.  Follow up with your PMD for A1C recheck  I personally performed the services described in this documentation, which was scribed in my presence. The recorded information has been reviewed and is accurate.       Cy BlamerApril Blakeley Margraf, MD 08/27/15 [phone]

## 2015-08-26 NOTE — ED Notes (Signed)
Patient states that he started to throw up about 9 pm tonight. The patient reports that hje is a type 1 diabetic and has not checked his sugar since he started to throw up - "9/10 times I end up here when he starts throwing up"

## 2015-08-27 LAB — CBG MONITORING, ED: Glucose-Capillary: 384 mg/dL — ABNORMAL HIGH (ref 65–99)

## 2015-08-27 NOTE — Discharge Instructions (Signed)
Blood Glucose Monitoring, Adult ° °Monitoring your blood glucose (also know as blood sugar) helps you to manage your diabetes. It also helps you and your health care provider monitor your diabetes and determine how well your treatment plan is working. °WHY SHOULD YOU MONITOR YOUR BLOOD GLUCOSE? °· It can help you understand how food, exercise, and medicine affect your blood glucose. °· It allows you to know what your blood glucose is at any given moment. You can quickly tell if you are having low blood glucose (hypoglycemia) or high blood glucose (hyperglycemia). °· It can help you and your health care provider know how to adjust your medicines. °· It can help you understand how to manage an illness or adjust medicine for exercise. °WHEN SHOULD YOU TEST? °Your health care provider will help you decide how often you should check your blood glucose. This may depend on the type of diabetes you have, your diabetes control, or the types of medicines you are taking. Be sure to write down all of your blood glucose readings so that this information can be reviewed with your health care provider. See below for examples of testing times that your health care provider may suggest. °Type 1 Diabetes °· Test at least 2 times per day if your diabetes is well controlled, if you are using an insulin pump, or if you perform multiple daily injections. °· If your diabetes is not well controlled or if you are sick, you may need to test more often. °· It is a good idea to also test: °¨ Before every insulin injection. °¨ Before and after exercise. °¨ Between meals and 2 hours after a meal. °¨ Occasionally between 2:00 a.m. and 3:00 a.m. °Type 2 Diabetes °· If you are taking insulin, test at least 2 times per day. However, it is best to test before every insulin injection. °· If you take medicines by mouth (orally), test 2 times a day. °· If you are on a controlled diet, test once a day. °· If your diabetes is not well controlled or if you  are sick, you may need to monitor more often. °HOW TO MONITOR YOUR BLOOD GLUCOSE °Supplies Needed °· Blood glucose meter. °· Test strips for your meter. Each meter has its own strips. You must use the strips that go with your own meter. °· A pricking needle (lancet). °· A device that holds the lancet (lancing device). °· A journal or log book to write down your results. °Procedure °· Wash your hands with soap and water. Alcohol is not preferred. °· Prick the side of your finger (not the tip) with the lancet. °· Gently milk the finger until a small drop of blood appears. °· Follow the instructions that come with your meter for inserting the test strip, applying blood to the strip, and using your blood glucose meter. °Other Areas to Get Blood for Testing °Some meters allow you to use other areas of your body (other than your finger) to test your blood. These areas are called alternative sites. The most common alternative sites are: °· The forearm. °· The thigh. °· The back area of the lower leg. °· The palm of the hand. °The blood flow in these areas is slower. Therefore, the blood glucose values you get may be delayed, and the numbers are different from what you would get from your fingers. Do not use alternative sites if you think you are having hypoglycemia. Your reading will not be accurate. Always use a finger if you   are having hypoglycemia. Also, if you cannot feel your lows (hypoglycemia unawareness), always use your fingers for your blood glucose checks. °ADDITIONAL TIPS FOR GLUCOSE MONITORING °· Do not reuse lancets. °· Always carry your supplies with you. °· All blood glucose meters have a 24-hour "hotline" number to call if you have questions or need help. °· Adjust (calibrate) your blood glucose meter with a control solution after finishing a few boxes of strips. °BLOOD GLUCOSE RECORD KEEPING °It is a good idea to keep a daily record or log of your blood glucose readings. Most glucose meters, if not all,  keep your glucose records stored in the meter. Some meters come with the ability to download your records to your home computer. Keeping a record of your blood glucose readings is especially helpful if you are wanting to look for patterns. Make notes to go along with the blood glucose readings because you might forget what happened at that exact time. Keeping good records helps you and your health care provider to work together to achieve good diabetes management.  °  °This information is not intended to replace advice given to you by your health care provider. Make sure you discuss any questions you have with your health care provider. °  °Document Released: 08/11/2003 Document Revised: 08/29/2014 Document Reviewed: 12/31/2012 °Elsevier Interactive Patient Education ©2016 [employer] ° °

## 2015-10-19 ENCOUNTER — Encounter (HOSPITAL_BASED_OUTPATIENT_CLINIC_OR_DEPARTMENT_OTHER): Payer: Self-pay | Admitting: *Deleted

## 2015-10-19 ENCOUNTER — Emergency Department (HOSPITAL_BASED_OUTPATIENT_CLINIC_OR_DEPARTMENT_OTHER)
Admission: EM | Admit: 2015-10-19 | Discharge: 2015-10-19 | Disposition: A | Discharge status: Home / Self Care | Payer: MEDICAID | Attending: Emergency Medicine | Admitting: Emergency Medicine

## 2015-10-19 DIAGNOSIS — E109 Type 1 diabetes mellitus without complications: Secondary | ICD-10-CM | POA: Insufficient documentation

## 2015-10-19 DIAGNOSIS — Z87891 Personal history of nicotine dependence: Secondary | ICD-10-CM | POA: Insufficient documentation

## 2015-10-19 DIAGNOSIS — R112 Nausea with vomiting, unspecified: Secondary | ICD-10-CM

## 2015-10-19 DIAGNOSIS — Z79899 Other long term (current) drug therapy: Secondary | ICD-10-CM | POA: Insufficient documentation

## 2015-10-19 DIAGNOSIS — I10 Essential (primary) hypertension: Secondary | ICD-10-CM | POA: Insufficient documentation

## 2015-10-19 DIAGNOSIS — R000 Tachycardia, unspecified: Secondary | ICD-10-CM | POA: Insufficient documentation

## 2015-10-19 DIAGNOSIS — Z794 Long term (current) use of insulin: Secondary | ICD-10-CM | POA: Insufficient documentation

## 2015-10-19 DIAGNOSIS — Z8659 Personal history of other mental and behavioral disorders: Secondary | ICD-10-CM | POA: Insufficient documentation

## 2015-10-19 LAB — CBC WITH DIFFERENTIAL/PLATELET
Basophils Absolute: 0 10*3/uL (ref 0.0–0.1)
Basophils Relative: 0 %
EOS ABS: 0 10*3/uL (ref 0.0–0.7)
Eosinophils Relative: 0 %
HCT: 45.4 % (ref 39.0–52.0)
HEMOGLOBIN: 16.1 g/dL (ref 13.0–17.0)
LYMPHS ABS: 0.6 10*3/uL — AB (ref 0.7–4.0)
Lymphocytes Relative: 7 %
MCH: 29.4 pg (ref 26.0–34.0)
MCHC: 35.5 g/dL (ref 30.0–36.0)
MCV: 83 fL (ref 78.0–100.0)
Monocytes Absolute: 0.4 10*3/uL (ref 0.1–1.0)
Monocytes Relative: 5 %
NEUTROS ABS: 7.9 10*3/uL — AB (ref 1.7–7.7)
NEUTROS PCT: 88 %
Platelets: 341 10*3/uL (ref 150–400)
RBC: 5.47 MIL/uL (ref 4.22–5.81)
RDW: 12.5 % (ref 11.5–15.5)
WBC: 9 10*3/uL (ref 4.0–10.5)

## 2015-10-19 LAB — COMPREHENSIVE METABOLIC PANEL
ALBUMIN: 4.3 g/dL (ref 3.5–5.0)
ALT: 17 U/L (ref 17–63)
AST: 19 U/L (ref 15–41)
Alkaline Phosphatase: 61 U/L (ref 38–126)
Anion gap: 12 (ref 5–15)
BUN: 17 mg/dL (ref 6–20)
CO2: 25 mmol/L (ref 22–32)
CREATININE: 0.86 mg/dL (ref 0.61–1.24)
Calcium: 8.7 mg/dL — ABNORMAL LOW (ref 8.9–10.3)
Chloride: 99 mmol/L — ABNORMAL LOW (ref 101–111)
GFR calc non Af Amer: >60 mL/min (ref >60)
GLUCOSE: 308 mg/dL — AB (ref 65–99)
Potassium: 3.3 mmol/L — ABNORMAL LOW (ref 3.5–5.1)
Sodium: 136 mmol/L (ref 135–145)
TOTAL PROTEIN: 7 g/dL (ref 6.5–8.1)
Total Bilirubin: 2.3 mg/dL — ABNORMAL HIGH (ref 0.3–1.2)

## 2015-10-19 LAB — CBG MONITORING, ED: Glucose-Capillary: 258 mg/dL — ABNORMAL HIGH (ref 65–99)

## 2015-10-19 LAB — LIPASE, BLOOD: Lipase: 17 U/L (ref 11–51)

## 2015-10-19 MED ORDER — ONDANSETRON HCL 4 MG/2ML IJ SOLN
4.0000 mg | Freq: Once | INTRAMUSCULAR | Status: AC
  Administered 2015-10-19: 4 mg via INTRAVENOUS
  Filled 2015-10-19: qty 2

## 2015-10-19 MED ORDER — ONDANSETRON 8 MG PO TBDP: 8.0000 mg | ORAL_TABLET | Freq: Three times a day (TID) | ORAL | Status: DC | PRN

## 2015-10-19 MED ORDER — MORPHINE SULFATE (PF) 4 MG/ML IV SOLN
6.0000 mg | Freq: Once | INTRAVENOUS | Status: AC
  Administered 2015-10-19: 6 mg via INTRAVENOUS
  Filled 2015-10-19: qty 2

## 2015-10-19 MED ORDER — SODIUM CHLORIDE 0.9 % IV BOLUS (SEPSIS)
2000.0000 mL | Freq: Once | INTRAVENOUS | Status: AC
  Administered 2015-10-19: 2000 mL via INTRAVENOUS

## 2015-10-19 NOTE — Discharge Instructions (Signed)

## 2015-10-19 NOTE — ED Notes (Addendum)
Pt c/o N/V/D since last night. Pt also c/o HA and body aches. Pt sts vomiting episodes of this nature routinely put him in DKA.

## 2015-10-19 NOTE — ED Provider Notes (Signed)
CSN: [id number]     Arrival date & time 10/19/15  1104 History   First MD Initiated Contact with Patient 10/19/15 1156     Chief Complaint  Patient presents with  . Emesis      HPI Patient presents to the emergency department with nausea vomiting diarrhea over the past 12-24 hours.  He reports diffuse myalgias.  His wife and his son are in the emergency department with similar symptoms.  He does have a history of diabetes and is a type I diabetic.  He's been compliant with his meds.  He want to make sure he was not in DKA.  No hematemesis.  Symptoms are mild to moderate.  He's feeling better after IV fluids.   Past Medical History  Diagnosis Date  . 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 Essential hypertension, benign 02/04/2011  . Goiter, unspecified 02/04/2011  . ADD (attention deficit disorder)   . Allergic rhinitis    Past Surgical History  Procedure Laterality Date  . Tonsillectomy     Family History  Problem Relation Age of Onset  . Cancer Neg Hx   . Lupus Cousin    Social History  Substance Use Topics  . Smoking status: Former Smoker -- 1.00 packs/day for 1 years    Types: Cigarettes  . Smokeless tobacco: Former Neurosurgeon    Quit date: 06/26/2013  . Alcohol Use: No    Review of Systems  All other systems reviewed and are negative.     Allergies  Review of patient's allergies indicates no known allergies.  Home Medications   Prior to Admission medications   Medication Sig Start Date End Date Taking? Authorizing Provider  acetaminophen (TYLENOL) 500 MG tablet Take 500 mg by mouth every 6 (six) hours as needed.    Historical Provider, MD  Aspirin-Acetaminophen-Caffeine (GOODY HEADACHE PO) Take 1 tablet by mouth daily as needed. For headache    Historical Provider, MD  glucose blood test strip 1 each by Other route 2 (two) times daily. Use as instructed 01/03/13   Romero Belling, MD  insulin aspart (NOVOLOG)  100 UNIT/ML injection Inject 0-12 Units into the skin 3 (three) times daily with meals. As per sliding scale. 03/24/15   Jaclyn Shaggy, MD  insulin glargine (LANTUS) 100 UNIT/ML injection Inject 0.3 mLs (30 Units total) into the skin at bedtime. 04/07/15   Jaclyn Shaggy, MD  Insulin Pen Needle 31G X 8 MM MISC 1 each by Does not apply route 4 (four) times daily -  with meals and at bedtime. 03/24/15   Jaclyn Shaggy, MD  INSULIN SYRINGE .5CC/28G 28G X 1/2" 0.5 ML MISC Use to administer insulins as prescribed 07/10/14   Russella Dar, NP  lisinopril (PRINIVIL,ZESTRIL) 2.5 MG tablet Take 2 tablets (5 mg total) by mouth daily. 04/29/15   Jaclyn Shaggy, MD  ondansetron (ZOFRAN ODT) 8 MG disintegrating tablet Take 1 tablet (8 mg total) by mouth every 8 (eight) hours as needed for nausea or vomiting. 10/19/15   Azalia Bilis, MD   BP 105/78 mmHg  Pulse 98  Temp(Src) 99 F (37.2 C) (Oral)  Resp 18  Ht  (1.778 m)  Wt 150 lb (68.04 kg)  BMI 21.52 kg/m2  SpO2 100% Physical Exam  Constitutional: He is oriented to person, place, and time. He appears well-developed and well-nourished.  HENT:  Head: Normocephalic and atraumatic.  Eyes: EOM are normal.  Neck: Normal range of motion.  Cardiovascular: Regular  rhythm, normal heart sounds and intact distal pulses.   Tachycardia  Pulmonary/Chest: Effort normal and breath sounds normal. No respiratory distress.  Abdominal: Soft. He exhibits no distension. There is no tenderness.  Musculoskeletal: Normal range of motion.  Neurological: He is alert and oriented to person, place, and time.  Skin: Skin is warm and dry.  Psychiatric: He has a normal mood and affect. Judgment normal.  Nursing note and vitals reviewed.   ED Course  Procedures (including critical care time) Labs Review Labs Reviewed  CBC WITH DIFFERENTIAL/PLATELET - Abnormal; Notable for the following:    Neutro Abs 7.9 (*)    Lymphs Abs 0.6 (*)    All other components within normal limits   COMPREHENSIVE METABOLIC PANEL - Abnormal; Notable for the following:    Potassium 3.3 (*)    Chloride 99 (*)    Glucose, Bld 308 (*)    Calcium 8.7 (*)    Total Bilirubin 2.3 (*)    All other components within normal limits  CBG MONITORING, ED - Abnormal; Notable for the following:    Glucose-Capillary 258 (*)    All other components within normal limits  LIPASE, BLOOD  URINALYSIS, ROUTINE W REFLEX MICROSCOPIC (NOT AT [facility])    Imaging Review No results found. I have personally reviewed and evaluated these images and lab results as part of my medical decision-making.   EKG Interpretation None      MDM   Final diagnoses:  Nausea and vomiting, vomiting of unspecified type    Patient feels better after IV fluids.  No signs of DKA.  Likely viral gastroenteritis.  Hydrated in the ER.  Discharge home in good condition.    Azalia Bilis, MD 10/19/15 1414

## 2016-03-22 ENCOUNTER — Emergency Department (HOSPITAL_BASED_OUTPATIENT_CLINIC_OR_DEPARTMENT_OTHER)
Admission: EM | Admit: 2016-03-22 | Discharge: 2016-03-23 | Disposition: A | Discharge status: Home / Self Care | Payer: Self-pay | Attending: Emergency Medicine | Admitting: Emergency Medicine

## 2016-03-22 ENCOUNTER — Encounter (HOSPITAL_BASED_OUTPATIENT_CLINIC_OR_DEPARTMENT_OTHER): Payer: Self-pay | Admitting: Emergency Medicine

## 2016-03-22 DIAGNOSIS — Z794 Long term (current) use of insulin: Secondary | ICD-10-CM | POA: Insufficient documentation

## 2016-03-22 DIAGNOSIS — E1065 Type 1 diabetes mellitus with hyperglycemia: Secondary | ICD-10-CM | POA: Insufficient documentation

## 2016-03-22 DIAGNOSIS — I10 Essential (primary) hypertension: Secondary | ICD-10-CM | POA: Insufficient documentation

## 2016-03-22 DIAGNOSIS — Z87891 Personal history of nicotine dependence: Secondary | ICD-10-CM | POA: Insufficient documentation

## 2016-03-22 NOTE — ED Triage Notes (Signed)
Patient states that he had a blood sugar that was over 400 and he covered it with insulin. The patient then throw up and since then he has had a burning feeling in his chest

## 2016-03-23 LAB — I-STAT ARTERIAL BLOOD GAS, ED
Bicarbonate: 24.8 mEq/L — ABNORMAL HIGH (ref 20.0–24.0)
O2 SAT: 98 %
PCO2 ART: 38.3 mmHg (ref 35.0–45.0)
TCO2: 26 mmol/L (ref 0–100)
pH, Arterial: 7.42 (ref 7.350–7.450)
pO2, Arterial: 99 mmHg (ref 80.0–100.0)

## 2016-03-23 LAB — COMPREHENSIVE METABOLIC PANEL
ALK PHOS: 77 U/L (ref 38–126)
ALT: 17 U/L (ref 17–63)
AST: 17 U/L (ref 15–41)
Albumin: 4.7 g/dL (ref 3.5–5.0)
Anion gap: 11 (ref 5–15)
BILIRUBIN TOTAL: 2 mg/dL — AB (ref 0.3–1.2)
BUN: 23 mg/dL — ABNORMAL HIGH (ref 6–20)
CALCIUM: 9.3 mg/dL (ref 8.9–10.3)
CO2: 25 mmol/L (ref 22–32)
CREATININE: 0.79 mg/dL (ref 0.61–1.24)
Chloride: 98 mmol/L — ABNORMAL LOW (ref 101–111)
Glucose, Bld: 387 mg/dL — ABNORMAL HIGH (ref 65–99)
Potassium: 3.6 mmol/L (ref 3.5–5.1)
SODIUM: 134 mmol/L — AB (ref 135–145)
TOTAL PROTEIN: 6.9 g/dL (ref 6.5–8.1)

## 2016-03-23 LAB — CBG MONITORING, ED
GLUCOSE-CAPILLARY: 165 mg/dL — AB (ref 65–99)
GLUCOSE-CAPILLARY: 382 mg/dL — AB (ref 65–99)
Glucose-Capillary: 198 mg/dL — ABNORMAL HIGH (ref 65–99)

## 2016-03-23 LAB — CBC
HCT: 46 % (ref 39.0–52.0)
Hemoglobin: 16.3 g/dL (ref 13.0–17.0)
MCH: 29.2 pg (ref 26.0–34.0)
MCHC: 35.4 g/dL (ref 30.0–36.0)
MCV: 82.4 fL (ref 78.0–100.0)
PLATELETS: 399 10*3/uL (ref 150–400)
RBC: 5.58 MIL/uL (ref 4.22–5.81)
RDW: 12.5 % (ref 11.5–15.5)
WBC: 11.3 10*3/uL — ABNORMAL HIGH (ref 4.0–10.5)

## 2016-03-23 LAB — DIFFERENTIAL
BASOS ABS: 0 10*3/uL (ref 0.0–0.1)
BASOS PCT: 0 %
EOS ABS: 0.1 10*3/uL (ref 0.0–0.7)
Eosinophils Relative: 1 %
Lymphocytes Relative: 20 %
Lymphs Abs: 2.3 10*3/uL (ref 0.7–4.0)
MONO ABS: 0.7 10*3/uL (ref 0.1–1.0)
MONOS PCT: 6 %
Neutro Abs: 8.4 10*3/uL — ABNORMAL HIGH (ref 1.7–7.7)
Neutrophils Relative %: 73 %

## 2016-03-23 LAB — LIPASE, BLOOD: Lipase: 25 U/L (ref 11–51)

## 2016-03-23 MED ORDER — ONDANSETRON HCL 4 MG/2ML IJ SOLN
4.0000 mg | Freq: Once | INTRAMUSCULAR | Status: AC
  Administered 2016-03-23: 4 mg via INTRAVENOUS
  Filled 2016-03-23: qty 2

## 2016-03-23 MED ORDER — PANTOPRAZOLE SODIUM 40 MG IV SOLR
40.0000 mg | Freq: Once | INTRAVENOUS | Status: AC
  Administered 2016-03-23: 40 mg via INTRAVENOUS
  Filled 2016-03-23: qty 40

## 2016-03-23 MED ORDER — SODIUM CHLORIDE 0.9 % IV BOLUS (SEPSIS)
1000.0000 mL | Freq: Once | INTRAVENOUS | Status: AC
  Administered 2016-03-23: 1000 mL via INTRAVENOUS

## 2016-03-23 MED ORDER — ONDANSETRON 8 MG PO TBDP: 8.0000 mg | ORAL_TABLET | Freq: Three times a day (TID) | ORAL | 0 refills | Status: DC | PRN

## 2016-03-23 MED ORDER — SODIUM CHLORIDE 0.9 % IV SOLN
INTRAVENOUS | Status: DC
  Administered 2016-03-23: 3.2 [IU]/h via INTRAVENOUS
  Filled 2016-03-23 (×2): qty 2.5

## 2016-03-23 NOTE — ED Notes (Signed)
MD at bedside requesting an update to the Blood sugar. Post Blood sugar checl IV and meds d/c'd for d/c home

## 2016-03-23 NOTE — ED Provider Notes (Signed)
[facility] Provider Note   CSN: [id number] Arrival date & time: 03/22/16  2342  First Provider Contact:  First MD Initiated Contact with Patient 03/23/16 0011        History   Chief Complaint Chief Complaint  Patient presents with  . Vomiting    HPI John [name] is a 25 y.o. male with insulin-dependent diabetes. He was at work about 10 PM yesterday evening when he felt a sudden sensation of a "hot flash". This was followed by a single episode of vomiting. He had checked his sugar about 30 minutes earlier and found to be 459; he gave himself 5 or 6 units of NovoLog. He states his symptoms are similar to how he feels when he is heading into DKA. His nausea has come and gone since but he continues to have heartburn. His sugar on arrival here was 382.  HPI  Past Medical History:  Diagnosis Date  . ADD (attention deficit disorder)   . Allergic rhinitis   . Essential hypertension, benign 02/04/2011  . Goiter, unspecified 02/04/2011  . Type I (juvenile type) diabetes mellitus without mention of complication, uncontrolled 02/04/2011    Patient Active Problem List   Diagnosis Date Noted  . Diabetic ketoacidosis without coma associated with type 1 diabetes mellitus (HCC)   . DKA, type 1 (HCC) 07/07/2014  . Tobacco abuse 07/07/2014  . Migraine headache 03/26/2014  . DKA (diabetic ketoacidoses) (HCC) 08/28/2012  . Dehydration 08/28/2012  . Leukocytosis 08/28/2012  . Alcohol intake above recommended sensible limits (HCC) 03/29/2011  . Smoker 03/29/2011  . 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 Essential hypertension, benign 02/04/2011  . Goiter, unspecified 02/04/2011    Past Surgical History:  Procedure Laterality Date  . TONSILLECTOMY       Home Medications    Prior to Admission medications   Medication Sig Start Date End Date Taking? Authorizing Provider  acetaminophen (TYLENOL) 500 MG tablet  Take 500 mg by mouth every 6 (six) hours as needed.    Historical Provider, MD  Aspirin-Acetaminophen-Caffeine (GOODY HEADACHE PO) Take 1 tablet by mouth daily as needed. For headache    Historical Provider, MD  glucose blood test strip 1 each by Other route 2 (two) times daily. Use as instructed 01/03/13   Romero Belling, MD  insulin aspart (NOVOLOG) 100 UNIT/ML injection Inject 0-12 Units into the skin 3 (three) times daily with meals. As per sliding scale. 03/24/15   Jaclyn Shaggy, MD  insulin glargine (LANTUS) 100 UNIT/ML injection Inject 0.3 mLs (30 Units total) into the skin at bedtime. 04/07/15   Jaclyn Shaggy, MD  Insulin Pen Needle 31G X 8 MM MISC 1 each by Does not apply route 4 (four) times daily -  with meals and at bedtime. 03/24/15   Jaclyn Shaggy, MD  INSULIN SYRINGE .5CC/28G 28G X 1/2" 0.5 ML MISC Use to administer insulins as prescribed 07/10/14   Russella Dar, NP  lisinopril (PRINIVIL,ZESTRIL) 2.5 MG tablet Take 2 tablets (5 mg total) by mouth daily. 04/29/15   Jaclyn Shaggy, MD  ondansetron (ZOFRAN ODT) 8 MG disintegrating tablet Take 1 tablet (8 mg total) by mouth every 8 (eight) hours as needed for nausea or vomiting. 03/23/16   Paula Libra, MD    Family History Family History  Problem Relation Age of Onset  . Lupus Cousin   . Cancer Neg Hx     Social History Social History  Substance Use Topics  .  Smoking status: Former Smoker    Packs/day: 1.00    Years: 1.00    Types: Cigarettes  . Smokeless tobacco: Former Neurosurgeon    Quit date: 06/26/2013  . Alcohol use No     Allergies   Review of patient's allergies indicates no known allergies.   Review of Systems Review of Systems  All other systems reviewed and are negative.    Physical Exam Updated Vital Signs BP 113/67   Pulse 91   Temp 98.1 F (36.7 C) (Oral)   Resp 16   Ht  (1.778 m)   Wt 150 lb 11.2 oz (68.4 kg)   SpO2 100%   BMI 21.62 kg/m   Physical Exam General: Well-developed, thin male in no acute  distress; appearance consistent with age of record HENT: normocephalic; atraumatic Eyes: pupils equal, round and reactive to light; extraocular muscles intact Neck: supple Heart: regular rate and rhythm Lungs: clear to auscultation bilaterally Abdomen: soft; nondistended; nontender; no masses or hepatosplenomegaly; bowel sounds present Extremities: No deformity; full range of motion; pulses normal Neurologic: Awake, alert and oriented; motor function intact in all extremities and symmetric; no facial droop Skin: Warm and dry Psychiatric: Normal mood and affect    ED Treatments / Results   Nursing notes and vitals signs, including pulse oximetry, reviewed.  Summary of this visit's results, reviewed by myself:  Labs:  Results for orders placed or performed during the hospital encounter of 03/22/16 (from the past 24 hour(s))  Lipase, blood     Status: None   Collection Time: 03/22/16 11:55 PM  Result Value Ref Range   Lipase 25 11 - 51 U/L  Comprehensive metabolic panel     Status: Abnormal   Collection Time: 03/22/16 11:55 PM  Result Value Ref Range   Sodium 134 (L) 135 - 145 mmol/L   Potassium 3.6 3.5 - 5.1 mmol/L   Chloride 98 (L) 101 - 111 mmol/L   CO2 25 22 - 32 mmol/L   Glucose, Bld 387 (H) 65 - 99 mg/dL   BUN 23 (H) 6 - 20 mg/dL   Creatinine, Ser 1.61 0.61 - 1.24 mg/dL   Calcium 9.3 8.9 - 09.6 mg/dL   Total Protein 6.9 6.5 - 8.1 g/dL   Albumin 4.7 3.5 - 5.0 g/dL   AST 17 15 - 41 U/L   ALT 17 17 - 63 U/L   Alkaline Phosphatase 77 38 - 126 U/L   Total Bilirubin 2.0 (H) 0.3 - 1.2 mg/dL   GFR calc non Af Amer >60 >60 mL/min   GFR calc Af Amer >60 >60 mL/min   Anion gap 11 5 - 15  CBC     Status: Abnormal   Collection Time: 03/22/16 11:55 PM  Result Value Ref Range   WBC 11.3 (H) 4.0 - 10.5 K/uL   RBC 5.58 4.22 - 5.81 MIL/uL   Hemoglobin 16.3 13.0 - 17.0 g/dL   HCT 04.5 40.9 - 81.1 %   MCV 82.4 78.0 - 100.0 fL   MCH 29.2 26.0 - 34.0 pg   MCHC 35.4 30.0 - 36.0  g/dL   RDW 91.4 78.2 - 95.6 %   Platelets 399 150 - 400 K/uL  Differential     Status: Abnormal   Collection Time: 03/22/16 11:55 PM  Result Value Ref Range   Neutrophils Relative % 73 %   Neutro Abs 8.4 (H) 1.7 - 7.7 K/uL   Lymphocytes Relative 20 %   Lymphs Abs 2.3 0.7 - 4.0  K/uL   Monocytes Relative 6 %   Monocytes Absolute 0.7 0.1 - 1.0 K/uL   Eosinophils Relative 1 %   Eosinophils Absolute 0.1 0.0 - 0.7 K/uL   Basophils Relative 0 %   Basophils Absolute 0.0 0.0 - 0.1 K/uL  POC CBG, ED     Status: Abnormal   Collection Time: 03/23/16 12:11 AM  Result Value Ref Range   Glucose-Capillary 382 (H) 65 - 99 mg/dL  I-Stat Arterial Blood Gas, ED - (order at [facility] and [facility] only)     Status: Abnormal   Collection Time: 03/23/16 12:41 AM  Result Value Ref Range   pH, Arterial 7.420 7.350 - 7.450   pCO2 arterial 38.3 35.0 - 45.0 mmHg   pO2, Arterial 99.0 80.0 - 100.0 mmHg   Bicarbonate 24.8 (H) 20.0 - 24.0 mEq/L   TCO2 26 0 - 100 mmol/L   O2 Saturation 98.0 %   Patient temperature 98.6 F    Collection site RADIAL, ALLEN'S TEST ACCEPTABLE    Drawn by RT    Sample type ARTERIAL   CBG monitoring, ED     Status: Abnormal   Collection Time: 03/23/16  2:15 AM  Result Value Ref Range   Glucose-Capillary 198 (H) 65 - 99 mg/dL  POC CBG, ED     Status: Abnormal   Collection Time: 03/23/16  2:56 AM  Result Value Ref Range   Glucose-Capillary 165 (H) 65 - 99 mg/dL    Procedures (including critical care time)   Final Clinical Impressions(s) / ED Diagnoses  2:57 AM Sugar down to 165. Patient feels much better. He is tolerating fluids without emesis. No evidence of DKA at this time.  Final diagnoses:  Hyperglycemia due to type 1 diabetes mellitus (HCC)      Paula Libra, MD 03/23/16 [phone]

## 2016-03-23 NOTE — ED Notes (Signed)
Report given to Darlys Gales RN

## 2016-04-19 ENCOUNTER — Encounter: Payer: Self-pay | Admitting: Family Medicine

## 2016-04-19 ENCOUNTER — Ambulatory Visit: Payer: BLUE CROSS/BLUE SHIELD | Attending: Family Medicine | Admitting: Family Medicine

## 2016-04-19 VITALS — BP 117/78 | HR 97 | Temp 98.6°F | Wt 163.4 lb

## 2016-04-19 DIAGNOSIS — Z794 Long term (current) use of insulin: Secondary | ICD-10-CM | POA: Diagnosis not present

## 2016-04-19 DIAGNOSIS — E1065 Type 1 diabetes mellitus with hyperglycemia: Secondary | ICD-10-CM | POA: Insufficient documentation

## 2016-04-19 DIAGNOSIS — Z029 Encounter for administrative examinations, unspecified: Secondary | ICD-10-CM | POA: Diagnosis not present

## 2016-04-19 DIAGNOSIS — F909 Attention-deficit hyperactivity disorder, unspecified type: Secondary | ICD-10-CM | POA: Insufficient documentation

## 2016-04-19 DIAGNOSIS — F902 Attention-deficit hyperactivity disorder, combined type: Secondary | ICD-10-CM

## 2016-04-19 LAB — GLUCOSE, POCT (MANUAL RESULT ENTRY): POC GLUCOSE: 307 mg/dL — AB (ref 70–99)

## 2016-04-19 LAB — POCT GLYCOSYLATED HEMOGLOBIN (HGB A1C): Hemoglobin A1C: 10

## 2016-04-19 MED ORDER — ONETOUCH ULTRA SYSTEM W/DEVICE KIT: 1.0000 | PACK | Freq: Once | 5 refills | Status: AC

## 2016-04-19 MED ORDER — GLUCOSE BLOOD VI STRP: ORAL_STRIP | 12 refills | Status: DC

## 2016-04-19 MED ORDER — INSULIN GLARGINE 100 UNIT/ML ~~LOC~~ SOLN: 37.0000 [IU] | Freq: Every day | SUBCUTANEOUS | 3 refills | Status: DC

## 2016-04-19 MED ORDER — INSULIN ASPART 100 UNIT/ML ~~LOC~~ SOLN: 0.0000 [IU] | Freq: Three times a day (TID) | SUBCUTANEOUS | 3 refills | Status: DC

## 2016-04-19 NOTE — Progress Notes (Signed)
Subjective:  Patient ID: John [name], male    DOB: October 29, 1990  Age: 25 y.o. MRN: [id number]  CC: Medication Refill   HPI John [name] is a 25 year old male with history of type 1 diabetes mellitus (A1c 10.0), noncompliance who comes into the clinic requesting completion of a form for DMV so he can obtain his drivers license.  He was last seen in the clinic 11 months ago and he informs me he was unable to get an appointment but states he has been compliant with his Lantus and NovoLog which she had been getting from his mom. He was last seen at the ED 3 weeks ago for DKA.  He will need prescription for glucometer. Also requests to be placed back on his Adderall which he previously took for ADHD so this can help him with his work.  Outpatient Medications Prior to Visit  Medication Sig Dispense Refill  . acetaminophen (TYLENOL) 500 MG tablet Take 500 mg by mouth every 6 (six) hours as needed.    . Aspirin-Acetaminophen-Caffeine (GOODY HEADACHE PO) Take 1 tablet by mouth daily as needed. For headache    . glucose blood test strip 1 each by Other route 2 (two) times daily. Use as instructed    . Insulin Pen Needle 31G X 8 MM MISC 1 each by Does not apply route 4 (four) times daily -  with meals and at bedtime. 100 each 5  . INSULIN SYRINGE .5CC/28G 28G X 1/2" 0.5 ML MISC Use to administer insulins as prescribed 300 each 0  . ondansetron (ZOFRAN ODT) 8 MG disintegrating tablet Take 1 tablet (8 mg total) by mouth every 8 (eight) hours as needed for nausea or vomiting. 10 tablet 0  . insulin aspart (NOVOLOG) 100 UNIT/ML injection Inject 0-12 Units into the skin 3 (three) times daily with meals. As per sliding scale. 10 mL 3  . insulin glargine (LANTUS) 100 UNIT/ML injection Inject 0.3 mLs (30 Units total) into the skin at bedtime. 10 mL 2  . lisinopril (PRINIVIL,ZESTRIL) 2.5 MG tablet Take 2 tablets (5 mg total) by mouth daily. (Patient not taking: Reported on 04/19/2016) 30 tablet 2   No  facility-administered medications prior to visit.     ROS Review of Systems  Constitutional: Negative for activity change and appetite change.  HENT: Negative for sinus pressure and sore throat.   Eyes: Negative for visual disturbance.  Respiratory: Negative for cough, chest tightness and shortness of breath.   Cardiovascular: Negative for chest pain and leg swelling.  Gastrointestinal: Negative for abdominal distention, abdominal pain, constipation and diarrhea.  Endocrine: Negative.   Genitourinary: Negative for dysuria.  Musculoskeletal: Negative for joint swelling and myalgias.  Skin: Negative for rash.  Allergic/Immunologic: Negative.   Neurological: Negative for weakness, light-headedness and numbness.  Psychiatric/Behavioral: Negative for dysphoric mood and suicidal ideas.    Objective:  BP 117/78 (BP Location: Right Arm, Patient Position: Sitting, Cuff Size: Large)   Pulse 97   Temp 98.6 F (37 C) (Oral)   Wt 163 lb 6.4 oz (74.1 kg)   SpO2 99%   BMI 23.45 kg/m   BP/Weight 04/19/2016 5/0/5697 04/26/8015  Systolic BP 553 748 -  Diastolic BP 78 67 -  Wt. (Lbs) 163.4 - 150.7  BMI 23.45 - 21.62      Physical Exam  Constitutional: He is oriented to person, place, and time. He appears well-developed and well-nourished.  Cardiovascular: Normal rate, normal heart sounds and intact distal pulses.   No murmur  heard. Pulmonary/Chest: Effort normal and breath sounds normal. He has no wheezes. He has no rales. He exhibits no tenderness.  Abdominal: Soft. Bowel sounds are normal. He exhibits no distension and no mass. There is no tenderness.  Musculoskeletal: Normal range of motion.  Neurological: He is alert and oriented to person, place, and time.    Lab Results  Component Value Date   HGBA1C 10.0 04/19/2016    Assessment & Plan:   1. Type 1 diabetes mellitus with hyperglycemia (HCC) Uncontrolled with A1c of 10.0 due to noncompliance Stressed importance of  compliance DMV form completed. - POCT glucose (manual entry) - POCT glycosylated hemoglobin (Hb A1C) - insulin aspart (NOVOLOG) 100 UNIT/ML injection; Inject 0-12 Units into the skin 3 (three) times daily with meals. As per sliding scale.  Dispense: 10 mL; Refill: 3 - insulin glargine (LANTUS) 100 UNIT/ML injection; Inject 0.37 mLs (37 Units total) into the skin at bedtime.  Dispense: 10 mL; Refill: 3 - Blood Glucose Monitoring Suppl (ONE TOUCH ULTRA SYSTEM KIT) w/Device KIT; 1 kit by Does not apply route once.  Dispense: 1 each; Refill: 5 - glucose blood (ONE TOUCH ULTRA TEST) test strip; Use as instructed 3x daily  Dispense: 100 each; Refill: 12  2. Attention deficit hyperactivity disorder (ADHD), combined type Explained that he'll be referred to psych for initiation of stimulants. - Ambulatory referral to Psychiatry  3. Encounters for administrative purpose DMV form completed   Meds ordered this encounter  Medications  . insulin aspart (NOVOLOG) 100 UNIT/ML injection    Sig: Inject 0-12 Units into the skin 3 (three) times daily with meals. As per sliding scale.    Dispense:  10 mL    Refill:  3  . insulin glargine (LANTUS) 100 UNIT/ML injection    Sig: Inject 0.37 mLs (37 Units total) into the skin at bedtime.    Dispense:  10 mL    Refill:  3    Discontinue 25 units  . Blood Glucose Monitoring Suppl (ONE TOUCH ULTRA SYSTEM KIT) w/Device KIT    Sig: 1 kit by Does not apply route once.    Dispense:  1 each    Refill:  5    Please dispense lancets #[id number]  . glucose blood (ONE TOUCH ULTRA TEST) test strip    Sig: Use as instructed 3x daily    Dispense:  100 each    Refill:  12    Follow-up: Return in about 1 month (around 05/20/2016) for Follow-up on diabetes mellitus.   Arnoldo Morale MD

## 2016-04-19 NOTE — Patient Instructions (Signed)
Diabetes Mellitus and Food It is important for you to manage your blood sugar (glucose) level. Your blood glucose level can be greatly affected by what you eat. Eating healthier foods in the appropriate amounts throughout the day at about the same time each day will help you control your blood glucose level. It can also help slow or prevent worsening of your diabetes mellitus. Healthy eating may even help you improve the level of your blood pressure and reach or maintain a healthy weight.  General recommendations for healthful eating and cooking habits include:  Eating meals and snacks regularly. Avoid going long periods of time without eating to lose weight.  Eating a diet that consists mainly of plant-based foods, such as fruits, vegetables, nuts, legumes, and whole grains.  Using low-heat cooking methods, such as baking, instead of high-heat cooking methods, such as deep frying. Work with your dietitian to make sure you understand how to use the Nutrition Facts information on food labels. HOW CAN FOOD AFFECT ME? Carbohydrates Carbohydrates affect your blood glucose level more than any other type of food. Your dietitian will help you determine how many carbohydrates to eat at each meal and teach you how to count carbohydrates. Counting carbohydrates is important to keep your blood glucose at a healthy level, especially if you are using insulin or taking certain medicines for diabetes mellitus. Alcohol Alcohol can cause sudden decreases in blood glucose (hypoglycemia), especially if you use insulin or take certain medicines for diabetes mellitus. Hypoglycemia can be a life-threatening condition. Symptoms of hypoglycemia (sleepiness, dizziness, and disorientation) are similar to symptoms of having too much alcohol.  If your health care provider has given you approval to drink alcohol, do so in moderation and use the following guidelines:  Women should not have more than one drink per day, and men  should not have more than two drinks per day. One drink is equal to:  12 oz of beer.  5 oz of wine.  1 oz of hard liquor.  Do not drink on an empty stomach.  Keep yourself hydrated. Have water, diet soda, or unsweetened iced tea.  Regular soda, juice, and other mixers might contain a lot of carbohydrates and should be counted. WHAT FOODS ARE NOT RECOMMENDED? As you make food choices, it is important to remember that all foods are not the same. Some foods have fewer nutrients per serving than other foods, even though they might have the same number of calories or carbohydrates. It is difficult to get your body what it needs when you eat foods with fewer nutrients. Examples of foods that you should avoid that are high in calories and carbohydrates but low in nutrients include:  Trans fats (most processed foods list trans fats on the Nutrition Facts label).  Regular soda.  Juice.  Candy.  Sweets, such as cake, pie, doughnuts, and cookies.  Fried foods. WHAT FOODS CAN I EAT? Eat nutrient-rich foods, which will nourish your body and keep you healthy. The food you should eat also will depend on several factors, including:  The calories you need.  The medicines you take.  Your weight.  Your blood glucose level.  Your blood pressure level.  Your cholesterol level. You should eat a variety of foods, including:  Protein.  Lean cuts of meat.  Proteins low in saturated fats, such as fish, egg whites, and beans. Avoid processed meats.  Fruits and vegetables.  Fruits and vegetables that may help control blood glucose levels, such as apples, mangoes, and   yams.  Dairy products.  Choose fat-free or low-fat dairy products, such as milk, yogurt, and cheese.  Grains, bread, pasta, and rice.  Choose whole grain products, such as multigrain bread, whole oats, and brown rice. These foods may help control blood pressure.  Fats.  Foods containing healthful fats, such as nuts,  avocado, olive oil, canola oil, and fish. DOES EVERYONE WITH DIABETES MELLITUS HAVE THE SAME MEAL PLAN? Because every person with diabetes mellitus is different, there is not one meal plan that works for everyone. It is very important that you meet with a dietitian who will help you create a meal plan that is just right for you.   This information is not intended to replace advice given to you by your health care provider. Make sure you discuss any questions you have with your health care provider.   Document Released: 05/05/2005 Document Revised: 08/29/2014 Document Reviewed: 07/05/2013 Elsevier Interactive Patient Education 2016 [employer] Inc[employer]  

## 2016-05-12 ENCOUNTER — Telehealth: Payer: Self-pay

## 2016-05-12 NOTE — Telephone Encounter (Signed)
Patient mother called and stated she needs a letter with patients A1C on a doctors letter with provider signature because they are trying to get pts license back and the dmv told her that they willl need a letter stating that. Pt stated she will call in the morning and will be by tomorrow to pick up the letter I informed pt mother that I will need to send the nurse a message and they will call when the letter is ready. Pt mother also wanted to set up an appointment I also informed her that she will need to call Monday morning at 9am to schedule that appointment

## 2016-05-16 ENCOUNTER — Telehealth: Payer: Self-pay | Admitting: Family Medicine

## 2016-05-16 NOTE — Telephone Encounter (Signed)
Letter faxed [phone] per patient's request.

## 2016-05-16 NOTE — Telephone Encounter (Signed)
Patient needs letter on letterhead stating A1C.  Please fax [phone]

## 2016-05-16 NOTE — Telephone Encounter (Signed)
Ready for pick up

## 2016-05-25 ENCOUNTER — Encounter: Payer: Self-pay | Admitting: Family Medicine

## 2016-05-25 ENCOUNTER — Ambulatory Visit: Payer: BLUE CROSS/BLUE SHIELD | Attending: Family Medicine | Admitting: Family Medicine

## 2016-05-25 VITALS — BP 88/58 | HR 105 | Temp 98.3°F | Ht 70.0 in | Wt 157.6 lb

## 2016-05-25 DIAGNOSIS — Z794 Long term (current) use of insulin: Secondary | ICD-10-CM | POA: Diagnosis not present

## 2016-05-25 DIAGNOSIS — R51 Headache: Secondary | ICD-10-CM | POA: Insufficient documentation

## 2016-05-25 DIAGNOSIS — R110 Nausea: Secondary | ICD-10-CM | POA: Diagnosis not present

## 2016-05-25 DIAGNOSIS — Z7982 Long term (current) use of aspirin: Secondary | ICD-10-CM | POA: Diagnosis not present

## 2016-05-25 DIAGNOSIS — E1065 Type 1 diabetes mellitus with hyperglycemia: Secondary | ICD-10-CM | POA: Insufficient documentation

## 2016-05-25 DIAGNOSIS — I10 Essential (primary) hypertension: Secondary | ICD-10-CM | POA: Diagnosis not present

## 2016-05-25 LAB — GLUCOSE, POCT (MANUAL RESULT ENTRY): POC GLUCOSE: 248 mg/dL — AB (ref 70–99)

## 2016-05-25 MED ORDER — INSULIN GLARGINE 100 UNIT/ML ~~LOC~~ SOLN: 37.0000 [IU] | Freq: Every day | SUBCUTANEOUS | 3 refills | Status: DC

## 2016-05-25 MED ORDER — INSULIN ASPART 100 UNIT/ML ~~LOC~~ SOLN: 0.0000 [IU] | Freq: Three times a day (TID) | SUBCUTANEOUS | 3 refills | Status: DC

## 2016-05-25 MED ORDER — ATORVASTATIN CALCIUM 20 MG PO TABS: 20.0000 mg | ORAL_TABLET | Freq: Every day | ORAL | 3 refills | Status: DC

## 2016-05-25 NOTE — Patient Instructions (Signed)
Diabetes Mellitus and Food It is important for you to manage your blood sugar (glucose) level. Your blood glucose level can be greatly affected by what you eat. Eating healthier foods in the appropriate amounts throughout the day at about the same time each day will help you control your blood glucose level. It can also help slow or prevent worsening of your diabetes mellitus. Healthy eating may even help you improve the level of your blood pressure and reach or maintain a healthy weight.  General recommendations for healthful eating and cooking habits include:  Eating meals and snacks regularly. Avoid going long periods of time without eating to lose weight.  Eating a diet that consists mainly of plant-based foods, such as fruits, vegetables, nuts, legumes, and whole grains.  Using low-heat cooking methods, such as baking, instead of high-heat cooking methods, such as deep frying. Work with your dietitian to make sure you understand how to use the Nutrition Facts information on food labels. HOW CAN FOOD AFFECT ME? Carbohydrates Carbohydrates affect your blood glucose level more than any other type of food. Your dietitian will help you determine how many carbohydrates to eat at each meal and teach you how to count carbohydrates. Counting carbohydrates is important to keep your blood glucose at a healthy level, especially if you are using insulin or taking certain medicines for diabetes mellitus. Alcohol Alcohol can cause sudden decreases in blood glucose (hypoglycemia), especially if you use insulin or take certain medicines for diabetes mellitus. Hypoglycemia can be a life-threatening condition. Symptoms of hypoglycemia (sleepiness, dizziness, and disorientation) are similar to symptoms of having too much alcohol.  If your health care provider has given you approval to drink alcohol, do so in moderation and use the following guidelines:  Women should not have more than one drink per day, and men  should not have more than two drinks per day. One drink is equal to:  12 oz of beer.  5 oz of wine.  1 oz of hard liquor.  Do not drink on an empty stomach.  Keep yourself hydrated. Have water, diet soda, or unsweetened iced tea.  Regular soda, juice, and other mixers might contain a lot of carbohydrates and should be counted. WHAT FOODS ARE NOT RECOMMENDED? As you make food choices, it is important to remember that all foods are not the same. Some foods have fewer nutrients per serving than other foods, even though they might have the same number of calories or carbohydrates. It is difficult to get your body what it needs when you eat foods with fewer nutrients. Examples of foods that you should avoid that are high in calories and carbohydrates but low in nutrients include:  Trans fats (most processed foods list trans fats on the Nutrition Facts label).  Regular soda.  Juice.  Candy.  Sweets, such as cake, pie, doughnuts, and cookies.  Fried foods. WHAT FOODS CAN I EAT? Eat nutrient-rich foods, which will nourish your body and keep you healthy. The food you should eat also will depend on several factors, including:  The calories you need.  The medicines you take.  Your weight.  Your blood glucose level.  Your blood pressure level.  Your cholesterol level. You should eat a variety of foods, including:  Protein.  Lean cuts of meat.  Proteins low in saturated fats, such as fish, egg whites, and beans. Avoid processed meats.  Fruits and vegetables.  Fruits and vegetables that may help control blood glucose levels, such as apples, mangoes, and   yams.  Dairy products.  Choose fat-free or low-fat dairy products, such as milk, yogurt, and cheese.  Grains, bread, pasta, and rice.  Choose whole grain products, such as multigrain bread, whole oats, and brown rice. These foods may help control blood pressure.  Fats.  Foods containing healthful fats, such as nuts,  avocado, olive oil, canola oil, and fish. DOES EVERYONE WITH DIABETES MELLITUS HAVE THE SAME MEAL PLAN? Because every person with diabetes mellitus is different, there is not one meal plan that works for everyone. It is very important that you meet with a dietitian who will help you create a meal plan that is just right for you.   This information is not intended to replace advice given to you by your health care provider. Make sure you discuss any questions you have with your health care provider.   Document Released: 05/05/2005 Document Revised: 08/29/2014 Document Reviewed: 07/05/2013 Elsevier Interactive Patient Education 2016 [employer] Inc[employer]  

## 2016-05-25 NOTE — Progress Notes (Signed)
Subjective:  Patient ID: John [name], male    DOB: 03/04/1991  Age: 25 y.o. MRN: [id number]  CC: Diabetes; Headache; Nausea (vomitng); and Heartburn   HPI John [name] is a 25 year old male with a history of type 1 diabetes mellitus (A1c 10.0 from 03/2016) who comes into the clinic for a follow-up visit. He does not have his blood sugar log with him but reports that fasting sugar this morning was 94 and his random sugar was 194; he denies hyperglycemia or blood sugars above 200. Denies hypoglycemia on numbness in extremities.  He has not been able to drive assist with this medicine has been restored to him. He has no other complaints at this time.  Past Medical History:  Diagnosis Date  . ADD (attention deficit disorder)   . Allergic rhinitis   . Essential hypertension, benign 02/04/2011  . Goiter, unspecified 02/04/2011  . Type I (juvenile type) diabetes mellitus without mention of complication, uncontrolled 02/04/2011    Past Surgical History:  Procedure Laterality Date  . TONSILLECTOMY      No Known Allergies   Outpatient Medications Prior to Visit  Medication Sig Dispense Refill  . acetaminophen (TYLENOL) 500 MG tablet Take 500 mg by mouth every 6 (six) hours as needed.    Marland Kitchen. glucose blood (ONE TOUCH ULTRA TEST) test strip Use as instructed 3x daily 100 each 12  . glucose blood test strip 1 each by Other route 2 (two) times daily. Use as instructed    . Insulin Pen Needle 31G X 8 MM MISC 1 each by Does not apply route 4 (four) times daily -  with meals and at bedtime. 100 each 5  . INSULIN SYRINGE .5CC/28G 28G X 1/2" 0.5 ML MISC Use to administer insulins as prescribed 300 each 0  . ondansetron (ZOFRAN ODT) 8 MG disintegrating tablet Take 1 tablet (8 mg total) by mouth every 8 (eight) hours as needed for nausea or vomiting. 10 tablet 0  . insulin aspart (NOVOLOG) 100 UNIT/ML injection Inject 0-12 Units into the skin 3 (three) times daily with meals. As per sliding  scale. 10 mL 3  . insulin glargine (LANTUS) 100 UNIT/ML injection Inject 0.37 mLs (37 Units total) into the skin at bedtime. 10 mL 3  . Aspirin-Acetaminophen-Caffeine (GOODY HEADACHE PO) Take 1 tablet by mouth daily as needed. For headache    . lisinopril (PRINIVIL,ZESTRIL) 2.5 MG tablet Take 2 tablets (5 mg total) by mouth daily. (Patient not taking: Reported on 04/19/2016) 30 tablet 2   No facility-administered medications prior to visit.     ROS Review of Systems  Constitutional: Negative for activity change and appetite change.  HENT: Negative for sinus pressure and sore throat.   Eyes: Negative for visual disturbance.  Respiratory: Negative for cough, chest tightness and shortness of breath.   Cardiovascular: Negative for chest pain and leg swelling.  Gastrointestinal: Negative for abdominal distention, abdominal pain, constipation and diarrhea.  Endocrine: Negative.   Genitourinary: Negative for dysuria.  Musculoskeletal: Negative for joint swelling and myalgias.  Skin: Negative for rash.  Allergic/Immunologic: Negative.   Neurological: Negative for weakness, light-headedness and numbness.  Psychiatric/Behavioral: Negative for dysphoric mood and suicidal ideas.    Objective:  BP (!) 88/58 (BP Location: Right Arm, Patient Position: Sitting, Cuff Size: Large)   Pulse (!) 105   Temp 98.3 F (36.8 C) (Oral)   Ht 5\' 10"  (1.778 m)   Wt 157 lb 9.6 oz (71.5 kg)   SpO2 97%  BMI 22.61 kg/m   BP/Weight 05/25/2016 04/19/2016 03/23/2016  Systolic BP 88 117 113  Diastolic BP 58 78 67  Wt. (Lbs) 157.6 163.4 -  BMI 22.61 23.45 -      Physical Exam  Constitutional: He is oriented to person, place, and time. He appears well-developed and well-nourished.  Cardiovascular: Normal rate, normal heart sounds and intact distal pulses.   No murmur heard. Pulmonary/Chest: Effort normal and breath sounds normal. He has no wheezes. He has no rales. He exhibits no tenderness.  Abdominal: Soft.  Bowel sounds are normal. He exhibits no distension and no mass. There is no tenderness.  Musculoskeletal: Normal range of motion.  Neurological: He is alert and oriented to person, place, and time.    Lab Results  Component Value Date   HGBA1C 10.0 04/19/2016    Assessment & Plan:   1. Type 1 diabetes mellitus with hyperglycemia (HCC) Uncontrolled with A1c of 10.0 Blood sugars are improving No regimen changes at this time. - Glucose (CBG) - insulin glargine (LANTUS) 100 UNIT/ML injection; Inject 0.37 mLs (37 Units total) into the skin at bedtime.  Dispense: 10 mL; Refill: 3 - insulin aspart (NOVOLOG) 100 UNIT/ML injection; Inject 0-12 Units into the skin 3 (three) times daily with meals. As per sliding scale.  Dispense: 10 mL; Refill: 3 - atorvastatin (LIPITOR) 20 MG tablet; Take 1 tablet (20 mg total) by mouth daily.  Dispense: 30 tablet; Refill: 3   Meds ordered this encounter  Medications  . insulin glargine (LANTUS) 100 UNIT/ML injection    Sig: Inject 0.37 mLs (37 Units total) into the skin at bedtime.    Dispense:  10 mL    Refill:  3    Discontinue 25 units  . insulin aspart (NOVOLOG) 100 UNIT/ML injection    Sig: Inject 0-12 Units into the skin 3 (three) times daily with meals. As per sliding scale.    Dispense:  10 mL    Refill:  3  . atorvastatin (LIPITOR) 20 MG tablet    Sig: Take 1 tablet (20 mg total) by mouth daily.    Dispense:  30 tablet    Refill:  3    Follow-up: Return in about 3 months (around 08/25/2016) for Follow-up on diabetes mellitus.   Jaclyn Shaggy MD

## 2016-06-21 ENCOUNTER — Encounter (HOSPITAL_BASED_OUTPATIENT_CLINIC_OR_DEPARTMENT_OTHER): Payer: Self-pay

## 2016-06-21 ENCOUNTER — Emergency Department (HOSPITAL_BASED_OUTPATIENT_CLINIC_OR_DEPARTMENT_OTHER)
Admission: EM | Admit: 2016-06-21 | Discharge: 2016-06-21 | Disposition: A | Discharge status: Home / Self Care | Payer: BLUE CROSS/BLUE SHIELD | Attending: Physician Assistant | Admitting: Physician Assistant

## 2016-06-21 ENCOUNTER — Emergency Department (HOSPITAL_BASED_OUTPATIENT_CLINIC_OR_DEPARTMENT_OTHER): Payer: BLUE CROSS/BLUE SHIELD

## 2016-06-21 DIAGNOSIS — Z794 Long term (current) use of insulin: Secondary | ICD-10-CM | POA: Insufficient documentation

## 2016-06-21 DIAGNOSIS — Z7982 Long term (current) use of aspirin: Secondary | ICD-10-CM | POA: Insufficient documentation

## 2016-06-21 DIAGNOSIS — R52 Pain, unspecified: Secondary | ICD-10-CM | POA: Diagnosis present

## 2016-06-21 DIAGNOSIS — Z79899 Other long term (current) drug therapy: Secondary | ICD-10-CM | POA: Diagnosis not present

## 2016-06-21 DIAGNOSIS — E1065 Type 1 diabetes mellitus with hyperglycemia: Secondary | ICD-10-CM | POA: Diagnosis not present

## 2016-06-21 DIAGNOSIS — J111 Influenza due to unidentified influenza virus with other respiratory manifestations: Secondary | ICD-10-CM | POA: Diagnosis not present

## 2016-06-21 DIAGNOSIS — I10 Essential (primary) hypertension: Secondary | ICD-10-CM | POA: Insufficient documentation

## 2016-06-21 DIAGNOSIS — Z87891 Personal history of nicotine dependence: Secondary | ICD-10-CM | POA: Insufficient documentation

## 2016-06-21 DIAGNOSIS — R69 Illness, unspecified: Secondary | ICD-10-CM

## 2016-06-21 DIAGNOSIS — F909 Attention-deficit hyperactivity disorder, unspecified type: Secondary | ICD-10-CM | POA: Diagnosis not present

## 2016-06-21 DIAGNOSIS — R739 Hyperglycemia, unspecified: Secondary | ICD-10-CM

## 2016-06-21 LAB — COMPREHENSIVE METABOLIC PANEL
ALBUMIN: 4.2 g/dL (ref 3.5–5.0)
ALK PHOS: 65 U/L (ref 38–126)
ALT: 14 U/L — ABNORMAL LOW (ref 17–63)
ANION GAP: 15 (ref 5–15)
AST: 13 U/L — ABNORMAL LOW (ref 15–41)
BILIRUBIN TOTAL: 3.1 mg/dL — AB (ref 0.3–1.2)
BUN: 21 mg/dL — ABNORMAL HIGH (ref 6–20)
CALCIUM: 9 mg/dL (ref 8.9–10.3)
CO2: 20 mmol/L — ABNORMAL LOW (ref 22–32)
Chloride: 100 mmol/L — ABNORMAL LOW (ref 101–111)
Creatinine, Ser: 0.81 mg/dL (ref 0.61–1.24)
Glucose, Bld: 329 mg/dL — ABNORMAL HIGH (ref 65–99)
POTASSIUM: 4 mmol/L (ref 3.5–5.1)
Sodium: 135 mmol/L (ref 135–145)
TOTAL PROTEIN: 6.7 g/dL (ref 6.5–8.1)

## 2016-06-21 LAB — CBC
HEMATOCRIT: 45.8 % (ref 39.0–52.0)
HEMOGLOBIN: 16 g/dL (ref 13.0–17.0)
MCH: 29.3 pg (ref 26.0–34.0)
MCHC: 34.9 g/dL (ref 30.0–36.0)
MCV: 83.9 fL (ref 78.0–100.0)
Platelets: 363 10*3/uL (ref 150–400)
RBC: 5.46 MIL/uL (ref 4.22–5.81)
RDW: 12.2 % (ref 11.5–15.5)
WBC: 10.2 10*3/uL (ref 4.0–10.5)

## 2016-06-21 LAB — CBG MONITORING, ED: GLUCOSE-CAPILLARY: 339 mg/dL — AB (ref 65–99)

## 2016-06-21 LAB — LIPASE, BLOOD: LIPASE: 17 U/L (ref 11–51)

## 2016-06-21 MED ORDER — SODIUM CHLORIDE 0.9 % IV BOLUS (SEPSIS)
1000.0000 mL | Freq: Once | INTRAVENOUS | Status: AC
  Administered 2016-06-21: 1000 mL via INTRAVENOUS

## 2016-06-21 MED ORDER — MORPHINE SULFATE (PF) 4 MG/ML IV SOLN
4.0000 mg | Freq: Once | INTRAVENOUS | Status: AC
Start: 2016-06-21 — End: 2016-06-21
  Administered 2016-06-21: 4 mg via INTRAVENOUS
  Filled 2016-06-21: qty 1

## 2016-06-21 MED ORDER — ONDANSETRON HCL 4 MG PO TABS: 4.0000 mg | ORAL_TABLET | Freq: Four times a day (QID) | ORAL | 0 refills | Status: DC

## 2016-06-21 MED ORDER — ONDANSETRON HCL 4 MG/2ML IJ SOLN
4.0000 mg | Freq: Once | INTRAMUSCULAR | Status: AC
  Administered 2016-06-21: 4 mg via INTRAVENOUS
  Filled 2016-06-21: qty 2

## 2016-06-21 MED ORDER — LOPERAMIDE HCL 2 MG PO CAPS: 2.0000 mg | ORAL_CAPSULE | Freq: Four times a day (QID) | ORAL | 0 refills | Status: DC | PRN

## 2016-06-21 NOTE — ED Triage Notes (Addendum)
C/o body aches, n/d x 1 epiosode-NAD-presents to triage in w/c-states has not taken OTC meds for pain

## 2016-06-21 NOTE — ED Notes (Signed)
CBG is 339. 

## 2016-06-21 NOTE — Discharge Instructions (Signed)
Please read and follow all provided instructions.  Your diagnoses today include:  1. Influenza-like illness   2. Hyperglycemia     You appear to have an upper respiratory infection (URI). An upper respiratory tract infection, or cold, is a viral infection of the air passages leading to the lungs. It should improve gradually after 5-7 days. You may have a lingering cough that lasts for 2- 4 weeks after the infection.  Tests performed today include: Vital signs. See below for your results today.   Medications prescribed:   Take any prescribed medications only as directed. Treatment for your infection is aimed at treating the symptoms. There are no medications, such as antibiotics, that will cure your infection.   Home care instructions:  Follow any educational materials contained in this packet.   Your illness is contagious and can be spread to others, especially during the first 3 or 4 days. It cannot be cured by antibiotics or other medicines. Take basic precautions such as washing your hands often, covering your mouth when you cough or sneeze, and avoiding public places where you could spread your illness to others.   Please continue drinking plenty of fluids.  Use over-the-counter medicines as needed as directed on packaging for symptom relief.  You may also use ibuprofen or tylenol as directed on packaging for pain or fever.  Do not take multiple medicines containing Tylenol or acetaminophen to avoid taking too much of this medication.  Follow-up instructions: Please follow-up with your primary care provider in the next 3 days for further evaluation of your symptoms if you are not feeling better.   Return instructions:  Please return to the Emergency Department if you experience worsening symptoms.  RETURN IMMEDIATELY IF you develop shortness of breath, confusion or altered mental status, a new rash, become dizzy, faint, or poorly responsive, or are unable to be cared for at  home. Please return if you have persistent vomiting and cannot keep down fluids or develop a fever that is not controlled by tylenol or motrin.   Please return if you have any other emergent concerns.  Additional Information:  Your vital signs today were: BP 130/80 (BP Location: Right Arm)    Pulse 112    Temp 98 F (36.7 C) (Oral)    Resp 18    Ht 5\' 9"  (1.753 m)    Wt 71.2 kg    SpO2 100%    BMI 23.18 kg/m  If your blood pressure (BP) was elevated above 135/85 this visit, please have this repeated by your doctor within one month. --------------

## 2016-06-21 NOTE — ED Provider Notes (Signed)
[facility]   CSN: [id number] Arrival date & time: 06/21/16  1541     History   Chief Complaint Chief Complaint  Patient presents with  . Generalized Body Aches    HPI John [name] is a 25 y.o. male.  HPI  25 y.o. male with a hx of DM Type 1, presents to the Emergency Department today complaining of generalized body aches as well as nausea and diarrhea. Pt notes fatigue began yesterday around 10pm. Notes no sick contacts. Minimal cough. Notes no emesis. Has generalized abdominal pain as well. No CP/SOB. Notes pain 10/10 "everywhere." Pt has previous hx DKA some time ago. No headaches. NO vision changes. Pt took insulin this AM. No fevers. No other symptoms noted.    Past Medical History:  Diagnosis Date  . ADD (attention deficit disorder)   . Allergic rhinitis   . Essential hypertension, benign 02/04/2011  . Goiter, unspecified 02/04/2011  . Type I (juvenile type) diabetes mellitus without mention of complication, uncontrolled 02/04/2011    Patient Active Problem List   Diagnosis Date Noted  . Attention deficit hyperactivity disorder (ADHD) 04/19/2016  . Diabetic ketoacidosis without coma associated with type 1 diabetes mellitus (HCC)   . DKA, type 1 (HCC) 07/07/2014  . Tobacco abuse 07/07/2014  . Migraine headache 03/26/2014  . DKA (diabetic ketoacidoses) (HCC) 08/28/2012  . Dehydration 08/28/2012  . Leukocytosis 08/28/2012  . Alcohol intake above recommended sensible limits 03/29/2011  . Smoker 03/29/2011  . 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 Essential hypertension, benign 02/04/2011  . Goiter, unspecified 02/04/2011    Past Surgical History:  Procedure Laterality Date  . TONSILLECTOMY       Home Medications    Prior to Admission medications   Medication Sig Start Date End Date Taking? Authorizing Provider  Aspirin-Acetaminophen-Caffeine (GOODY HEADACHE PO) Take 1  tablet by mouth daily as needed. For headache    Historical Provider, MD  atorvastatin (LIPITOR) 20 MG tablet Take 1 tablet (20 mg total) by mouth daily. 05/25/16   Jaclyn ShaggyEnobong Amao, MD  glucose blood (ONE TOUCH ULTRA TEST) test strip Use as instructed 3x daily 04/19/16   Jaclyn ShaggyEnobong Amao, MD  glucose blood test strip 1 each by Other route 2 (two) times daily. Use as instructed 01/03/13   Romero BellingSean Ellison, MD  insulin aspart (NOVOLOG) 100 UNIT/ML injection Inject 0-12 Units into the skin 3 (three) times daily with meals. As per sliding scale. 05/25/16   Jaclyn ShaggyEnobong Amao, MD  insulin glargine (LANTUS) 100 UNIT/ML injection Inject 0.37 mLs (37 Units total) into the skin at bedtime. 05/25/16   Jaclyn ShaggyEnobong Amao, MD  Insulin Pen Needle 31G X 8 MM MISC 1 each by Does not apply route 4 (four) times daily -  with meals and at bedtime. 03/24/15   Jaclyn ShaggyEnobong Amao, MD  INSULIN SYRINGE .5CC/28G 28G X 1/2" 0.5 ML MISC Use to administer insulins as prescribed 07/10/14   Russella DarAllison L Ellis, NP  ondansetron (ZOFRAN ODT) 8 MG disintegrating tablet Take 1 tablet (8 mg total) by mouth every 8 (eight) hours as needed for nausea or vomiting. 03/23/16   Paula LibraJohn Molpus, MD    Family History Family History  Problem Relation Age of Onset  . Lupus Cousin   . Cancer Neg Hx     Social History Social History  Substance Use Topics  . Smoking status: Former Smoker    Packs/day: 1.00    Years: 1.00  Types: Cigarettes  . Smokeless tobacco: Former NeurosurgeonUser    Quit date: 06/26/2013  . Alcohol use Yes     Comment: occ     Allergies   Review of patient's allergies indicates no known allergies.   Review of Systems Review of Systems ROS reviewed and all are negative for acute change except as noted in the HPI.  Physical Exam Updated Vital Signs BP 130/80 (BP Location: Right Arm)   Pulse 112   Temp 98 F (36.7 C) (Oral)   Resp 18   Ht 5\' 9"  (1.753 m)   Wt 71.2 kg   SpO2 100%   BMI 23.18 kg/m   Physical Exam  Constitutional: He is oriented to  person, place, and time. Vital signs are normal. He appears well-developed and well-nourished. No distress.  HENT:  Head: Normocephalic and atraumatic.  Right Ear: Hearing, tympanic membrane, external ear and ear canal normal.  Left Ear: Hearing, tympanic membrane, external ear and ear canal normal.  Nose: Nose normal.  Mouth/Throat: Uvula is midline, oropharynx is clear and moist and mucous membranes are normal. No trismus in the jaw. No oropharyngeal exudate, posterior oropharyngeal erythema or tonsillar abscesses.  Eyes: Conjunctivae and EOM are normal. Pupils are equal, round, and reactive to light.  Neck: Normal range of motion. Neck supple. No tracheal deviation present.  Cardiovascular: Regular rhythm, S1 normal, S2 normal, normal heart sounds, intact distal pulses and normal pulses.  Tachycardia present.   Pulmonary/Chest: Effort normal and breath sounds normal. No respiratory distress. He has no decreased breath sounds. He has no wheezes. He has no rhonchi. He has no rales.  Abdominal: Soft. Normal appearance and bowel sounds are normal. There is no tenderness. There is no rigidity, no rebound, no guarding, no CVA tenderness, no tenderness at McBurney's point and negative Murphy's sign.  Abdomen soft  Musculoskeletal: Normal range of motion.  Neurological: He is alert and oriented to person, place, and time.  Skin: Skin is warm and dry.  Psychiatric: He has a normal mood and affect. His speech is normal and behavior is normal. Thought content normal.  Nursing note and vitals reviewed.   ED Treatments / Results  Labs (all labs ordered are listed, but only abnormal results are displayed) Labs Reviewed  COMPREHENSIVE METABOLIC PANEL - Abnormal; Notable for the following:       Result Value   Chloride 100 (*)    CO2 20 (*)    Glucose, Bld 329 (*)    BUN 21 (*)    AST 13 (*)    ALT 14 (*)    Total Bilirubin 3.1 (*)    All other components within normal limits  CBG MONITORING, ED  - Abnormal; Notable for the following:    Glucose-Capillary 339 (*)    All other components within normal limits  CBC  LIPASE, BLOOD   EKG  EKG Interpretation None      Radiology No results found.  Procedures Procedures (including critical care time)  Medications Ordered in ED Medications  sodium chloride 0.9 % bolus 1,000 mL (1,000 mLs Intravenous New Bag/Given 06/21/16 1617)  ondansetron (ZOFRAN) injection 4 mg (4 mg Intravenous Given 06/21/16 1617)  morphine 4 MG/ML injection 4 mg (4 mg Intravenous Given 06/21/16 1617)   Initial Impression / Assessment and Plan / ED Course  I have reviewed the triage vital signs and the nursing notes.  Pertinent labs & imaging results that were available during my care of the patient were reviewed by me  and considered in my medical decision making (see chart for details).  Clinical Course   Final Clinical Impressions(s) / ED Diagnoses  I have reviewed and evaluated the relevant laboratory values I have reviewed and evaluated the relevant imaging studies.  I have reviewed the relevant previous healthcare records.I obtained HPI from historian. Patient discussed with supervising physician  ED Course:  Assessment: Pt is a 25yM with hx DM type 1 who presents with generalized body aches, nausea, diarrhea x 1 that began yesterday at 10pm. No sick contacts. On exam, pt in NAD. Nontoxic/nonseptic appearing. VS with tachycardia. Afebrile. Lungs CTA. Heart RRR. Abdomen nontender soft. POC Glucose 339. CBC unremarkable. No leukocytosis. Potassium 4.0. Gap 15. CXR unremarkable. Given NS bolus 1L in ED as Hyperglycemia likely exacerbated by viral infection. Plan is to DC Home. At time of discharge, Patient is in no acute distress. Vital Signs are stable. Patient is able to ambulate. Patient able to tolerate PO.   Disposition/Plan:  DC Home Additional Verbal discharge instructions given and discussed with patient.  Pt Instructed to f/u with PCP in the  next week for evaluation and treatment of symptoms. Return precautions given Pt acknowledges and agrees with plan  Supervising Physician Courteney Randall An, MD   Final diagnoses:  Influenza-like illness  Hyperglycemia    New Prescriptions New Prescriptions   No medications on file     Audry Pili, PA-C 06/21/16 1713    Courteney Lyn Mackuen, MD 06/22/16 1505

## 2016-11-29 ENCOUNTER — Other Ambulatory Visit: Payer: Self-pay | Admitting: Pharmacist

## 2016-11-29 DIAGNOSIS — E1065 Type 1 diabetes mellitus with hyperglycemia: Secondary | ICD-10-CM

## 2016-11-29 MED ORDER — INSULIN GLARGINE 100 UNIT/ML ~~LOC~~ SOLN: 37.0000 [IU] | Freq: Every day | SUBCUTANEOUS | 0 refills | Status: DC

## 2016-11-29 MED ORDER — INSULIN ASPART 100 UNIT/ML ~~LOC~~ SOLN: 0.0000 [IU] | Freq: Three times a day (TID) | SUBCUTANEOUS | 0 refills | Status: DC

## 2017-05-04 ENCOUNTER — Emergency Department (HOSPITAL_BASED_OUTPATIENT_CLINIC_OR_DEPARTMENT_OTHER)
Admission: EM | Admit: 2017-05-04 | Discharge: 2017-05-04 | Disposition: A | Discharge status: Home / Self Care | Payer: BLUE CROSS/BLUE SHIELD | Attending: Emergency Medicine | Admitting: Emergency Medicine

## 2017-05-04 ENCOUNTER — Encounter (HOSPITAL_BASED_OUTPATIENT_CLINIC_OR_DEPARTMENT_OTHER): Payer: Self-pay | Admitting: Emergency Medicine

## 2017-05-04 DIAGNOSIS — Z79899 Other long term (current) drug therapy: Secondary | ICD-10-CM | POA: Insufficient documentation

## 2017-05-04 DIAGNOSIS — Z794 Long term (current) use of insulin: Secondary | ICD-10-CM | POA: Insufficient documentation

## 2017-05-04 DIAGNOSIS — J4531 Mild persistent asthma with (acute) exacerbation: Secondary | ICD-10-CM | POA: Diagnosis not present

## 2017-05-04 DIAGNOSIS — E1065 Type 1 diabetes mellitus with hyperglycemia: Secondary | ICD-10-CM | POA: Diagnosis present

## 2017-05-04 DIAGNOSIS — I10 Essential (primary) hypertension: Secondary | ICD-10-CM | POA: Diagnosis not present

## 2017-05-04 DIAGNOSIS — Z87891 Personal history of nicotine dependence: Secondary | ICD-10-CM | POA: Insufficient documentation

## 2017-05-04 LAB — CBC WITH DIFFERENTIAL/PLATELET
Basophils Absolute: 0 10*3/uL (ref 0.0–0.1)
Basophils Relative: 0 %
EOS PCT: 2 %
Eosinophils Absolute: 0.2 10*3/uL (ref 0.0–0.7)
HCT: 42.7 % (ref 39.0–52.0)
Hemoglobin: 15 g/dL (ref 13.0–17.0)
Lymphocytes Relative: 31 %
Lymphs Abs: 2.8 10*3/uL (ref 0.7–4.0)
MCH: 29.1 pg (ref 26.0–34.0)
MCHC: 35.1 g/dL (ref 30.0–36.0)
MCV: 82.9 fL (ref 78.0–100.0)
MONO ABS: 0.7 10*3/uL (ref 0.1–1.0)
Monocytes Relative: 8 %
Neutro Abs: 5.4 10*3/uL (ref 1.7–7.7)
Neutrophils Relative %: 59 %
PLATELETS: 371 10*3/uL (ref 150–400)
RBC: 5.15 MIL/uL (ref 4.22–5.81)
RDW: 12.5 % (ref 11.5–15.5)
WBC: 9.1 10*3/uL (ref 4.0–10.5)

## 2017-05-04 LAB — COMPREHENSIVE METABOLIC PANEL
ALBUMIN: 4.1 g/dL (ref 3.5–5.0)
ALT: 10 U/L — AB (ref 17–63)
AST: 11 U/L — AB (ref 15–41)
Alkaline Phosphatase: 78 U/L (ref 38–126)
Anion gap: 6 (ref 5–15)
BUN: 19 mg/dL (ref 6–20)
CHLORIDE: 101 mmol/L (ref 101–111)
CO2: 29 mmol/L (ref 22–32)
CREATININE: 0.75 mg/dL (ref 0.61–1.24)
Calcium: 8.8 mg/dL — ABNORMAL LOW (ref 8.9–10.3)
GFR calc Af Amer: >60 mL/min (ref >60)
GLUCOSE: 307 mg/dL — AB (ref 65–99)
POTASSIUM: 3.8 mmol/L (ref 3.5–5.1)
SODIUM: 136 mmol/L (ref 135–145)
Total Bilirubin: 0.4 mg/dL (ref 0.3–1.2)
Total Protein: 6.6 g/dL (ref 6.5–8.1)

## 2017-05-04 LAB — CBG MONITORING, ED
GLUCOSE-CAPILLARY: 278 mg/dL — AB (ref 65–99)
Glucose-Capillary: 233 mg/dL — ABNORMAL HIGH (ref 65–99)

## 2017-05-04 LAB — I-STAT CG4 LACTIC ACID, ED: Lactic Acid, Venous: 0.82 mmol/L (ref 0.5–1.9)

## 2017-05-04 MED ORDER — SODIUM CHLORIDE 0.9 % IV BOLUS (SEPSIS)
1000.0000 mL | Freq: Once | INTRAVENOUS | Status: AC
  Administered 2017-05-04: 1000 mL via INTRAVENOUS

## 2017-05-04 MED ORDER — IPRATROPIUM-ALBUTEROL 0.5-2.5 (3) MG/3ML IN SOLN
RESPIRATORY_TRACT | Status: AC
  Administered 2017-05-04: 3 mL
  Filled 2017-05-04: qty 3

## 2017-05-04 MED ORDER — INSULIN ASPART 100 UNIT/ML ~~LOC~~ SOLN
8.0000 [IU] | Freq: Once | SUBCUTANEOUS | Status: AC
  Administered 2017-05-04: 8 [IU] via SUBCUTANEOUS
  Filled 2017-05-04: qty 1

## 2017-05-04 MED ORDER — DEXAMETHASONE 10 MG/ML FOR PEDIATRIC ORAL USE
0.1500 mg/kg | Freq: Once | INTRAMUSCULAR | Status: DC
  Filled 2017-05-04: qty 1

## 2017-05-04 MED ORDER — DEXAMETHASONE SODIUM PHOSPHATE 10 MG/ML IJ SOLN
10.0000 mg | Freq: Once | INTRAMUSCULAR | Status: AC
  Administered 2017-05-04: 10 mg via INTRAVENOUS

## 2017-05-04 MED ORDER — GLUCOSE BLOOD VI STRP: ORAL_STRIP | 12 refills | Status: DC

## 2017-05-04 MED ORDER — IPRATROPIUM-ALBUTEROL 0.5-2.5 (3) MG/3ML IN SOLN
3.0000 mL | Freq: Once | RESPIRATORY_TRACT | Status: AC
  Administered 2017-05-04: 3 mL via RESPIRATORY_TRACT
  Filled 2017-05-04: qty 3

## 2017-05-04 NOTE — ED Notes (Signed)
ED Provider at bedside. Dr Clarene DukeLittle. RT at bedside to admin breathing tx

## 2017-05-04 NOTE — ED Triage Notes (Signed)
Pt states BS have been running high. Pt is currently on abx and cough medicine for URI.

## 2017-05-04 NOTE — ED Notes (Signed)
CBG 233 reported to Dr. Clarene DukeLittle.

## 2017-05-04 NOTE — ED Triage Notes (Signed)
Pt states he ran out of his glucose test strips last night.

## 2017-05-04 NOTE — ED Provider Notes (Signed)
[facility]   CSN: [id number] Arrival date & time: 05/04/17  0617  History   Chief Complaint Chief Complaint  Patient presents with  . Hyperglycemia    HPI John [name] is a 26 y.o. male with T1DM and asthma presenting with hyperglycemia in the setting of URI currently on amox. Yesterday he was unable to get his sugars below 300. He was feeling poorly, had a headache, felt nauseated. He has had a cough for the past several weeks and is being treated for bronchitis by outside provider. Has been on antibiotics for 2 days with some improvement. He is requiring his inhaler every 6 hours. He reports shortness of breath with exertion and wheezing worse with coughing spells. He denies fevers, chills. Endorses nausea, no vomiting. No diarrhea or constipation.   HPI  Past Medical History:  Diagnosis Date  . ADD (attention deficit disorder)   . Allergic rhinitis   . Essential hypertension, benign 02/04/2011  . Goiter, unspecified 02/04/2011  . Type I (juvenile type) diabetes mellitus without mention of complication, uncontrolled 02/04/2011    Patient Active Problem List   Diagnosis Date Noted  . Attention deficit hyperactivity disorder (ADHD) 04/19/2016  . Diabetic ketoacidosis without coma associated with type 1 diabetes mellitus (HCC)   . DKA, type 1 (HCC) 07/07/2014  . Tobacco abuse 07/07/2014  . Migraine headache 03/26/2014  . DKA (diabetic ketoacidoses) (HCC) 08/28/2012  . Dehydration 08/28/2012  . Leukocytosis 08/28/2012  . Alcohol intake above recommended sensible limits 03/29/2011  . Smoker 03/29/2011  . 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 Essential hypertension, benign 02/04/2011  . Goiter, unspecified 02/04/2011    Past Surgical History:  Procedure Laterality Date  . TONSILLECTOMY      Home Medications    Prior to Admission medications   Medication Sig Start Date End Date  Taking? Authorizing Provider  albuterol (PROVENTIL HFA;VENTOLIN HFA) 108 (90 Base) MCG/ACT inhaler Inhale 2 puffs into the lungs every 6 (six) hours as needed for wheezing or shortness of breath.   Yes [provider]  amoxicillin-clavulanate (AUGMENTIN) 875-125 MG tablet Take 1 tablet by mouth 2 (two) times daily.   Yes [provider]  Aspirin-Acetaminophen-Caffeine (GOODY HEADACHE PO) Take 1 tablet by mouth daily as needed. For headache    [provider]  atorvastatin (LIPITOR) 20 MG tablet Take 1 tablet (20 mg total) by mouth daily. 05/25/16   Jaclyn Shaggy, MD  glucose blood (ONE TOUCH ULTRA TEST) test strip Use as instructed 3x daily 05/04/17   Dolores Patty C, DO  glucose blood test strip 1 each by Other route 2 (two) times daily. Use as instructed 01/03/13   Romero Belling, MD  insulin aspart (NOVOLOG) 100 UNIT/ML injection Inject 0-12 Units into the skin 3 (three) times daily with meals. As per sliding scale. 11/29/16   Jaclyn Shaggy, MD  insulin glargine (LANTUS) 100 UNIT/ML injection Inject 0.37 mLs (37 Units total) into the skin at bedtime. 11/29/16   Jaclyn Shaggy, MD  Insulin Pen Needle 31G X 8 MM MISC 1 each by Does not apply route 4 (four) times daily -  with meals and at bedtime. 03/24/15   Jaclyn Shaggy, MD  INSULIN SYRINGE .5CC/28G 28G X 1/2" 0.5 ML MISC Use to administer insulins as prescribed 07/10/14   Russella Dar, NP  loperamide (IMODIUM) 2 MG capsule Take 1 capsule (2 mg total) by mouth 4 (four) times daily as needed for  diarrhea or loose stools. 06/21/16   Audry PiliMohr, Tyler, PA-C  ondansetron (ZOFRAN ODT) 8 MG disintegrating tablet Take 1 tablet (8 mg total) by mouth every 8 (eight) hours as needed for nausea or vomiting. 03/23/16   Molpus, John, MD  ondansetron (ZOFRAN) 4 MG tablet Take 1 tablet (4 mg total) by mouth every 6 (six) hours. 06/21/16   Audry PiliMohr, Tyler, PA-C    Family History Family History  Problem Relation Age of Onset  . Lupus Cousin   .  Cancer Neg Hx     Social History Social History  Substance Use Topics  . Smoking status: Former Smoker    Packs/day: 1.00    Years: 1.00    Types: Cigarettes  . Smokeless tobacco: Former NeurosurgeonUser    Quit date: 06/26/2013  . Alcohol use Yes     Comment: occ   Allergies   Patient has no known allergies.   Review of Systems Review of Systems  Constitutional: Positive for chills. Negative for activity change, appetite change, diaphoresis, fatigue and fever.  HENT: Positive for congestion. Negative for rhinorrhea and sore throat.   Respiratory: Positive for cough, shortness of breath and wheezing.   Cardiovascular: Negative for chest pain.  Gastrointestinal: Positive for nausea. Negative for abdominal pain, constipation, diarrhea and vomiting.  Genitourinary: Negative for dysuria.  Musculoskeletal: Negative for myalgias and neck pain.  Skin: Negative for rash.  Neurological: Positive for headaches. Negative for weakness and numbness.  Psychiatric/Behavioral: Negative for confusion.     Physical Exam Updated Vital Signs BP 112/75 (BP Location: Right Arm)   Pulse (!) 103   Temp 97.9 F (36.6 C) (Oral)   Resp 20   Ht 5' 9.5" (1.765 m)   Wt 68 kg (150 lb)   SpO2 100%   BMI 21.83 kg/m   Physical Exam  Constitutional: He is oriented to person, place, and time. He appears well-developed and well-nourished. No distress.  HENT:  Head: Normocephalic and atraumatic.  Mouth/Throat: Oropharynx is clear and moist.  Eyes: Pupils are equal, round, and reactive to light. EOM are normal.  Neck: Normal range of motion. Neck supple.  Cardiovascular: Normal rate, regular rhythm, normal heart sounds and intact distal pulses.   No murmur heard. Pulmonary/Chest: Effort normal. No respiratory distress. He has wheezes.  Abdominal: Soft. He exhibits no distension. There is no tenderness.  Musculoskeletal: Normal range of motion.  Lymphadenopathy:    He has no cervical adenopathy.    Neurological: He is alert and oriented to person, place, and time. He exhibits normal muscle tone.  Skin: Skin is warm and dry. No rash noted.  Psychiatric: He has a normal mood and affect. His behavior is normal. Judgment and thought content normal.     ED Treatments / Results  Labs (all labs ordered are listed, but only abnormal results are displayed) Labs Reviewed  COMPREHENSIVE METABOLIC PANEL - Abnormal; Notable for the following:       Result Value   Glucose, Bld 307 (*)    Calcium 8.8 (*)    AST 11 (*)    ALT 10 (*)    All other components within normal limits  CBG MONITORING, ED - Abnormal; Notable for the following:    Glucose-Capillary 278 (*)    All other components within normal limits  CBC WITH DIFFERENTIAL/PLATELET  I-STAT CG4 LACTIC ACID, ED  CBG MONITORING, ED  I-STAT CG4 LACTIC ACID, ED    EKG  EKG Interpretation None       Radiology  No results found.  Procedures Procedures (including critical care time)  Medications Ordered in ED Medications  sodium chloride 0.9 % bolus 1,000 mL (0 mLs Intravenous Stopped 05/04/17 0754)  ipratropium-albuterol (DUONEB) 0.5-2.5 (3) MG/3ML nebulizer solution 3 mL (3 mLs Nebulization Given 05/04/17 0755)  insulin aspart (novoLOG) injection 8 Units (8 Units Subcutaneous Given 05/04/17 0802)  ipratropium-albuterol (DUONEB) 0.5-2.5 (3) MG/3ML nebulizer solution (3 mLs  Given 05/04/17 0810)  dexamethasone (DECADRON) injection 10 mg (10 mg Intravenous Given 05/04/17 0806)     Initial Impression / Assessment and Plan / ED Course  I have reviewed the triage vital signs and the nursing notes.  Pertinent labs & imaging results that were available during my care of the patient were reviewed by me and considered in my medical decision making (see chart for details).     Patient with PMH of T1DM and asthma presenting with hyperglycemia in 300's, labs not consistent with DKA. Currently on antibiotics for URI. Wheezing on exam,  improved after 1 treatment with Duoneb. Given 8 units of insulin with improvement in sugar to 233. Will give 10 mg decadron, counseled patient this will increase his sugars and to go up 2-3 units on his sliding scale at home. Patient stable for discharge home. Return precautions discussed. Patient verbalized understanding and agreement with plan.  Final Clinical Impressions(s) / ED Diagnoses   Final diagnoses:  Hyperglycemia due to type 1 diabetes mellitus (HCC)  Mild persistent asthma with exacerbation   New Prescriptions Current Discharge Medication List     Dolores Patty, DO PGY-2, [facility] 05/04/2017 9:19 AM    Tillman Sers, DO 05/04/17 0919    Little, Ambrose Finland, MD 05/06/17 1051

## 2017-08-25 ENCOUNTER — Other Ambulatory Visit: Payer: Self-pay

## 2017-08-25 ENCOUNTER — Emergency Department (HOSPITAL_BASED_OUTPATIENT_CLINIC_OR_DEPARTMENT_OTHER)
Admission: EM | Admit: 2017-08-25 | Discharge: 2017-08-25 | Disposition: A | Discharge status: Home / Self Care | Payer: BLUE CROSS/BLUE SHIELD | Attending: Emergency Medicine | Admitting: Emergency Medicine

## 2017-08-25 ENCOUNTER — Encounter (HOSPITAL_BASED_OUTPATIENT_CLINIC_OR_DEPARTMENT_OTHER): Payer: Self-pay

## 2017-08-25 DIAGNOSIS — K0889 Other specified disorders of teeth and supporting structures: Secondary | ICD-10-CM

## 2017-08-25 HISTORY — DX: Type 2 diabetes mellitus with ketoacidosis without coma: E11.10

## 2017-08-25 HISTORY — DX: Unspecified asthma, uncomplicated: J45.909

## 2017-08-25 MED ORDER — HYDROCODONE-ACETAMINOPHEN 5-325 MG PO TABS
1.0000 | ORAL_TABLET | Freq: Once | ORAL | Status: AC
Start: 2017-08-25 — End: 2017-08-25
  Administered 2017-08-25: 1 via ORAL
  Filled 2017-08-25: qty 1

## 2017-08-25 MED ORDER — PENICILLIN V POTASSIUM 500 MG PO TABS: 500.0000 mg | ORAL_TABLET | Freq: Four times a day (QID) | ORAL | 0 refills | Status: AC

## 2017-08-25 MED ORDER — PENICILLIN V POTASSIUM 250 MG PO TABS
500.0000 mg | ORAL_TABLET | Freq: Once | ORAL | Status: AC
  Administered 2017-08-25: 500 mg via ORAL
  Filled 2017-08-25: qty 2

## 2017-08-25 MED ORDER — HYDROCODONE-ACETAMINOPHEN 5-325 MG PO TABS: 1.0000 | ORAL_TABLET | Freq: Four times a day (QID) | ORAL | 0 refills | Status: DC | PRN

## 2017-08-25 MED ORDER — ONDANSETRON 4 MG PO TBDP
4.0000 mg | ORAL_TABLET | Freq: Once | ORAL | Status: AC
  Administered 2017-08-25: 4 mg via ORAL
  Filled 2017-08-25: qty 1

## 2017-08-25 NOTE — ED Triage Notes (Signed)
Pt has broken molar to left lower jaw, pain unrelieved by OTC meds

## 2017-08-25 NOTE — ED Notes (Signed)
Pt verbalizes understanding of d/c instructions and denies any further need at this time. 

## 2017-08-25 NOTE — ED Provider Notes (Signed)
[facility] Provider Note: Lowella Dell, MD, FACEP  CSN: [id number] MRN: [id number] ARRIVAL: 08/25/17 at 0636 ROOM: [facility]06/[facility]06   CHIEF COMPLAINT  Dental Pain   HISTORY OF PRESENT ILLNESS  08/25/17 6:50 AM John [name] is a 27 y.o. male with type 1 diabetes.  He fractured his left lower first molar about 2 weeks ago.  He has had subsequent additional extension of the fracture.  He has had pain in that tooth since about 8 PM yesterday evening.  The pain worsened overnight and he now describes it as an aching pain, 10 out of 10 severity.  He has not gotten relief with over-the-counter medication.  He has not yet made an appointment with a dentist.   Past Medical History:  Diagnosis Date  . ADD (attention deficit disorder)   . Allergic rhinitis   . Asthma   . DKA (diabetic ketoacidoses) (HCC)   . Essential hypertension, benign 02/04/2011  . Goiter, unspecified 02/04/2011  . Type I (juvenile type) diabetes mellitus without mention of complication, uncontrolled 02/04/2011    Past Surgical History:  Procedure Laterality Date  . TONSILLECTOMY      Family History  Problem Relation Age of Onset  . Lupus Cousin   . Cancer Neg Hx     Social History   Tobacco Use  . Smoking status: Former Smoker    Packs/day: 1.00    Years: 1.00    Pack years: 1.00    Types: Cigarettes  . Smokeless tobacco: Former Neurosurgeon    Quit date: 06/26/2013  Substance Use Topics  . Alcohol use: Yes    Comment: occ  . Drug use: No    Prior to Admission medications   Medication Sig Start Date End Date Taking? Authorizing Provider  glucose blood (ONE TOUCH ULTRA TEST) test strip Use as instructed 3x daily 05/04/17  Yes Riccio, Angela C, DO  insulin aspart (NOVOLOG) 100 UNIT/ML injection Inject 0-12 Units into the skin 3 (three) times daily with meals. As per sliding scale. 11/29/16  Yes Jaclyn Shaggy, MD  insulin glargine (LANTUS) 100 UNIT/ML injection Inject 0.37 mLs (37 Units total) into the  skin at bedtime. 11/29/16  Yes Jaclyn Shaggy, MD  Insulin Pen Needle 31G X 8 MM MISC 1 each by Does not apply route 4 (four) times daily -  with meals and at bedtime. 03/24/15  Yes Jaclyn Shaggy, MD  INSULIN SYRINGE .5CC/28G 28G X 1/2" 0.5 ML MISC Use to administer insulins as prescribed 07/10/14  Yes Russella Dar, NP  albuterol (PROVENTIL HFA;VENTOLIN HFA) 108 (90 Base) MCG/ACT inhaler Inhale 2 puffs into the lungs every 6 (six) hours as needed for wheezing or shortness of breath.    [provider]  amoxicillin-clavulanate (AUGMENTIN) 875-125 MG tablet Take 1 tablet by mouth 2 (two) times daily.    [provider]  Aspirin-Acetaminophen-Caffeine (GOODY HEADACHE PO) Take 1 tablet by mouth daily as needed. For headache    [provider]  atorvastatin (LIPITOR) 20 MG tablet Take 1 tablet (20 mg total) by mouth daily. 05/25/16   Jaclyn Shaggy, MD  glucose blood test strip 1 each by Other route 2 (two) times daily. Use as instructed 01/03/13   Romero Belling, MD  HYDROcodone-acetaminophen [facility]) 5-325 MG tablet Take 1 tablet by mouth every 6 (six) hours as needed (for pain). 08/25/17   Denarius Sesler, MD  loperamide (IMODIUM) 2 MG capsule Take 1 capsule (2 mg total) by mouth 4 (four) times daily as needed  for diarrhea or loose stools. 06/21/16   Audry PiliMohr, Tyler, PA-C  ondansetron (ZOFRAN ODT) 8 MG disintegrating tablet Take 1 tablet (8 mg total) by mouth every 8 (eight) hours as needed for nausea or vomiting. 03/23/16   Suleyman Ehrman, MD  ondansetron (ZOFRAN) 4 MG tablet Take 1 tablet (4 mg total) by mouth every 6 (six) hours. 06/21/16   Audry PiliMohr, Tyler, PA-C  penicillin v potassium (VEETID) 500 MG tablet Take 1 tablet (500 mg total) by mouth 4 (four) times daily for 7 days. 08/25/17 09/01/17  Callaghan Laverdure, Jonny RuizJohn, MD    Allergies Patient has no known allergies.   REVIEW OF SYSTEMS  Negative except as noted here or in the History of Present Illness.   PHYSICAL EXAMINATION  Initial Vital  Signs Blood pressure (!) 150/104, pulse 99, temperature 97.9 F (36.6 C), temperature source Oral, resp. rate 18, height 5\' 10"  (1.778 m), weight 68.9 kg (152 lb), SpO2 100 %.  Examination General: Well-developed, well-nourished male in no acute distress; appearance consistent with age of record HENT: normocephalic; fractured, carious left lower first molar without adjacent gum swelling Eyes: pupils equal, round and reactive to light; extraocular muscles intact Neck: supple; no lymphadenopathy Heart: regular rate and rhythm Lungs: clear to auscultation bilaterally Abdomen: soft; nondistended present Extremities: No deformity; full range of motion Neurologic: Awake, alert and oriented; motor function intact in all extremities and symmetric; no facial droop Skin: Warm and dry Psychiatric: Normal mood and affect   RESULTS  Summary of this visit's results, reviewed by myself:   EKG Interpretation  Date/Time:    Ventricular Rate:    PR Interval:    QRS Duration:   QT Interval:    QTC Calculation:   R Axis:     Text Interpretation:        Laboratory Studies: No results found for this or any previous visit (from the past 24 hour(s)). Imaging Studies: No results found.  ED COURSE  Nursing notes and initial vitals signs, including pulse oximetry, reviewed.  Vitals:   08/25/17 0645  BP: (!) 150/104  Pulse: 99  Resp: 18  Temp: 97.9 F (36.6 C)  TempSrc: Oral  SpO2: 100%  Weight: 68.9 kg (152 lb)  Height: 5\' 10"  (1.778 m)   Patient given referral to Dr. Barbette MerinoJensen of oral surgery.  PROCEDURES    ED DIAGNOSES     ICD-10-CM   1. Pain, dental K08.89        Paula LibraMolpus, Stacee Earp, MD 08/25/17 [phone]

## 2017-12-05 ENCOUNTER — Encounter (HOSPITAL_BASED_OUTPATIENT_CLINIC_OR_DEPARTMENT_OTHER): Payer: Self-pay | Admitting: Emergency Medicine

## 2017-12-05 ENCOUNTER — Emergency Department (HOSPITAL_BASED_OUTPATIENT_CLINIC_OR_DEPARTMENT_OTHER)
Admission: EM | Admit: 2017-12-05 | Discharge: 2017-12-05 | Disposition: A | Discharge status: Home / Self Care | Payer: Self-pay | Attending: Emergency Medicine | Admitting: Emergency Medicine

## 2017-12-05 ENCOUNTER — Other Ambulatory Visit: Payer: Self-pay

## 2017-12-05 DIAGNOSIS — I10 Essential (primary) hypertension: Secondary | ICD-10-CM | POA: Insufficient documentation

## 2017-12-05 DIAGNOSIS — J029 Acute pharyngitis, unspecified: Secondary | ICD-10-CM | POA: Insufficient documentation

## 2017-12-05 DIAGNOSIS — J0100 Acute maxillary sinusitis, unspecified: Secondary | ICD-10-CM | POA: Insufficient documentation

## 2017-12-05 DIAGNOSIS — Z794 Long term (current) use of insulin: Secondary | ICD-10-CM | POA: Insufficient documentation

## 2017-12-05 DIAGNOSIS — E1010 Type 1 diabetes mellitus with ketoacidosis without coma: Secondary | ICD-10-CM | POA: Insufficient documentation

## 2017-12-05 DIAGNOSIS — Z79899 Other long term (current) drug therapy: Secondary | ICD-10-CM | POA: Insufficient documentation

## 2017-12-05 DIAGNOSIS — J45909 Unspecified asthma, uncomplicated: Secondary | ICD-10-CM | POA: Insufficient documentation

## 2017-12-05 DIAGNOSIS — Z87891 Personal history of nicotine dependence: Secondary | ICD-10-CM | POA: Insufficient documentation

## 2017-12-05 MED ORDER — AMOXICILLIN 500 MG PO CAPS: 500.0000 mg | ORAL_CAPSULE | Freq: Three times a day (TID) | ORAL | 0 refills | Status: DC

## 2017-12-05 MED FILL — AMOXICILLIN 500 MG CAPSULE: 500 | 7 days supply | Qty: 21 | Fill #0

## 2017-12-05 NOTE — ED Triage Notes (Signed)
Sore throat x 3 days. Son treated for strep throat.

## 2017-12-05 NOTE — Discharge Instructions (Addendum)
Claritin, and Mucinex D to help alleviate symptoms. Amoxicillin 3 times per day as directed until gone in 7 days. Gargle and spit warm salt water to help with throat pain. Tylenol and/or Motrin for headache

## 2017-12-05 NOTE — ED Provider Notes (Signed)
[facility] EMERGENCY DEPARTMENT Provider Note   CSN: [id number] Arrival date & time: 12/05/17  0745     History   Chief Complaint Chief Complaint  Patient presents with  . Sore Throat    HPI John [name] is a 27 y.o. male.  Chief complaint is facial pain, sore throat, right ear pain.  HPI 28-year-old male.  URI symptoms for a week.  Now has pain over his right maxilla.  Also has sore throat and sinus drainage.  His right ear feels pressure and painful.  He is concerned because his 28-year-old son has strep throat.  He has history of tonsillectomy as a child.  He has history of type 1 diabetes.  His sugars have been under good control.  No nausea or vomiting.  Hydrating well.  Cough, shortness of breath, or difficulty breathing.  Past Medical History:  Diagnosis Date  . ADD (attention deficit disorder)   . Allergic rhinitis   . Asthma   . DKA (diabetic ketoacidoses) (HCC)   . Essential hypertension, benign 02/04/2011  . Goiter, unspecified 02/04/2011  . Type I (juvenile type) diabetes mellitus without mention of complication, uncontrolled 02/04/2011    Patient Active Problem List   Diagnosis Date Noted  . Attention deficit hyperactivity disorder (ADHD) 04/19/2016  . Diabetic ketoacidosis without coma associated with type 1 diabetes mellitus (HCC)   . DKA, type 1 (HCC) 07/07/2014  . Tobacco abuse 07/07/2014  . Migraine headache 03/26/2014  . DKA (diabetic ketoacidoses) (HCC) 08/28/2012  . Dehydration 08/28/2012  . Leukocytosis 08/28/2012  . Alcohol intake above recommended sensible limits 03/29/2011  . Smoker 03/29/2011  . 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 Essential hypertension, benign 02/04/2011  . Goiter, unspecified 02/04/2011    Past Surgical History:  Procedure Laterality Date  . TONSILLECTOMY          Home Medications    Prior to Admission medications   Medication Sig Start  Date End Date Taking? Authorizing Provider  albuterol (PROVENTIL HFA;VENTOLIN HFA) 108 (90 Base) MCG/ACT inhaler Inhale 2 puffs into the lungs every 6 (six) hours as needed for wheezing or shortness of breath.    [provider]  amoxicillin (AMOXIL) 500 MG capsule Take 1 capsule (500 mg total) by mouth 3 (three) times daily. 12/05/17   Rolland Porter, MD  amoxicillin-clavulanate (AUGMENTIN) 875-125 MG tablet Take 1 tablet by mouth 2 (two) times daily.    [provider]  Aspirin-Acetaminophen-Caffeine (GOODY HEADACHE PO) Take 1 tablet by mouth daily as needed. For headache    [provider]  atorvastatin (LIPITOR) 20 MG tablet Take 1 tablet (20 mg total) by mouth daily. 05/25/16   Hoy Register, MD  glucose blood (ONE TOUCH ULTRA TEST) test strip Use as instructed 3x daily 05/04/17   Dolores Patty C, DO  glucose blood test strip 1 each by Other route 2 (two) times daily. Use as instructed 01/03/13   Romero Belling, MD  HYDROcodone-acetaminophen [facility]) 5-325 MG tablet Take 1 tablet by mouth every 6 (six) hours as needed (for pain). 08/25/17   Molpus, John, MD  insulin aspart (NOVOLOG) 100 UNIT/ML injection Inject 0-12 Units into the skin 3 (three) times daily with meals. As per sliding scale. 11/29/16   Hoy Register, MD  insulin glargine (LANTUS) 100 UNIT/ML injection Inject 0.37 mLs (37 Units total) into the skin at bedtime. 11/29/16   Hoy Register, MD  Insulin Pen Needle 31G X 8 MM MISC  1 each by Does not apply route 4 (four) times daily -  with meals and at bedtime. 03/24/15   Hoy Register, MD  INSULIN SYRINGE .5CC/28G 28G X 1/2" 0.5 ML MISC Use to administer insulins as prescribed 07/10/14   Russella Dar, NP  loperamide (IMODIUM) 2 MG capsule Take 1 capsule (2 mg total) by mouth 4 (four) times daily as needed for diarrhea or loose stools. 06/21/16   Audry Pili, PA-C  ondansetron (ZOFRAN ODT) 8 MG disintegrating tablet Take 1 tablet (8 mg total) by mouth every 8  (eight) hours as needed for nausea or vomiting. 03/23/16   Molpus, John, MD  ondansetron (ZOFRAN) 4 MG tablet Take 1 tablet (4 mg total) by mouth every 6 (six) hours. 06/21/16   Audry Pili, PA-C    Family History Family History  Problem Relation Age of Onset  . Lupus Cousin   . Cancer Neg Hx     Social History Social History   Tobacco Use  . Smoking status: Former Smoker    Packs/day: 1.00    Years: 1.00    Pack years: 1.00    Types: Cigarettes  . Smokeless tobacco: Former Neurosurgeon    Quit date: 06/26/2013  Substance Use Topics  . Alcohol use: Yes    Comment: occ  . Drug use: No     Allergies   Patient has no known allergies.   Review of Systems Review of Systems  Constitutional: Negative for appetite change, chills, diaphoresis, fatigue and fever.  HENT: Positive for congestion, ear pain, rhinorrhea, sinus pressure, sinus pain and sore throat. Negative for mouth sores and trouble swallowing.   Eyes: Negative for visual disturbance.  Respiratory: Negative for cough, chest tightness, shortness of breath and wheezing.   Cardiovascular: Negative for chest pain.  Gastrointestinal: Negative for abdominal distention, abdominal pain, diarrhea, nausea and vomiting.  Endocrine: Negative for polydipsia, polyphagia and polyuria.  Genitourinary: Negative for dysuria, frequency and hematuria.  Musculoskeletal: Negative for gait problem.  Skin: Negative for color change, pallor and rash.  Neurological: Negative for dizziness, syncope, light-headedness and headaches.  Hematological: Does not bruise/bleed easily.  Psychiatric/Behavioral: Negative for behavioral problems and confusion.     Physical Exam Updated Vital Signs BP 114/82 (BP Location: Left Arm)   Pulse 100   Temp 98.2 F (36.8 C) (Oral)   Resp 18   Ht 5\' 10"  (1.778 m)   Wt 68 kg (150 lb)   SpO2 100%   BMI 21.52 kg/m   Physical Exam  Constitutional: He is oriented to person, place, and time. He appears  well-developed and well-nourished. No distress.  HENT:  Head: Normocephalic.  Pharynx appears erythematous posteriorly.  Tonsils not visually evident.  He has tender anterior cervical lymphadenopathy.  He is tender over the right maxilla.  Dried secretions in both nares.  TMs appear normal.  Posterior neck is benign without lymphadenopathy.  No meningismus.  Eyes: Pupils are equal, round, and reactive to light. Conjunctivae are normal. No scleral icterus.  Neck: Normal range of motion. Neck supple. No thyromegaly present.  Cardiovascular: Normal rate and regular rhythm. Exam reveals no gallop and no friction rub.  No murmur heard. Pulmonary/Chest: Effort normal and breath sounds normal. No respiratory distress. He has no wheezes. He has no rales.  Abdominal: Soft. Bowel sounds are normal. He exhibits no distension. There is no tenderness. There is no rebound.  Musculoskeletal: Normal range of motion.  Neurological: He is alert and oriented to person, place, and time.  Skin: Skin is warm and dry. No rash noted.  Psychiatric: He has a normal mood and affect. His behavior is normal.     ED Treatments / Results  Labs (all labs ordered are listed, but only abnormal results are displayed) Labs Reviewed - No data to display  EKG None  Radiology No results found.  Procedures Procedures (including critical care time)  Medications Ordered in ED Medications - No data to display   Initial Impression / Assessment and Plan / ED Course  I have reviewed the triage vital signs and the nursing notes.  Pertinent labs & imaging results that were available during my care of the patient were reviewed by me and considered in my medical decision making (see chart for details).   Strep swab negative.  Symptoms consistent with acute maxillary sinusitis.  Given amoxicillin.  We use Mucinex D and Claritin.  Primary care follow-up.  ER if not improving.  Final Clinical Impressions(s) / ED Diagnoses    Final diagnoses:  Pharyngitis, unspecified etiology  Acute maxillary sinusitis, recurrence not specified    ED Discharge Orders        Ordered    amoxicillin (AMOXIL) 500 MG capsule  3 times daily     12/05/17 0911       Rolland PorterJames, Marselino Slayton, MD 12/05/17 [id number]

## 2018-02-12 ENCOUNTER — Encounter (HOSPITAL_BASED_OUTPATIENT_CLINIC_OR_DEPARTMENT_OTHER): Payer: Self-pay | Admitting: Emergency Medicine

## 2018-02-12 ENCOUNTER — Emergency Department (HOSPITAL_BASED_OUTPATIENT_CLINIC_OR_DEPARTMENT_OTHER): Payer: Self-pay

## 2018-02-12 ENCOUNTER — Other Ambulatory Visit: Payer: Self-pay

## 2018-02-12 ENCOUNTER — Emergency Department (HOSPITAL_BASED_OUTPATIENT_CLINIC_OR_DEPARTMENT_OTHER)
Admission: EM | Admit: 2018-02-12 | Discharge: 2018-02-12 | Disposition: A | Discharge status: Home / Self Care | Payer: Self-pay | Attending: Emergency Medicine | Admitting: Emergency Medicine

## 2018-02-12 DIAGNOSIS — F17210 Nicotine dependence, cigarettes, uncomplicated: Secondary | ICD-10-CM | POA: Insufficient documentation

## 2018-02-12 DIAGNOSIS — J45909 Unspecified asthma, uncomplicated: Secondary | ICD-10-CM | POA: Insufficient documentation

## 2018-02-12 DIAGNOSIS — E109 Type 1 diabetes mellitus without complications: Secondary | ICD-10-CM | POA: Insufficient documentation

## 2018-02-12 DIAGNOSIS — I10 Essential (primary) hypertension: Secondary | ICD-10-CM | POA: Insufficient documentation

## 2018-02-12 DIAGNOSIS — R05 Cough: Secondary | ICD-10-CM | POA: Insufficient documentation

## 2018-02-12 DIAGNOSIS — J0100 Acute maxillary sinusitis, unspecified: Secondary | ICD-10-CM | POA: Insufficient documentation

## 2018-02-12 DIAGNOSIS — R059 Cough, unspecified: Secondary | ICD-10-CM

## 2018-02-12 DIAGNOSIS — Z794 Long term (current) use of insulin: Secondary | ICD-10-CM | POA: Insufficient documentation

## 2018-02-12 MED ORDER — AMOXICILLIN 500 MG PO CAPS: 500.0000 mg | ORAL_CAPSULE | Freq: Three times a day (TID) | ORAL | 0 refills | Status: DC

## 2018-02-12 MED ORDER — BENZONATATE 100 MG PO CAPS: 100.0000 mg | ORAL_CAPSULE | Freq: Three times a day (TID) | ORAL | 0 refills | Status: DC | PRN

## 2018-02-12 MED FILL — AMOXICILLIN 500 MG CAPSULE: 500 | 7 days supply | Qty: 21 | Fill #0

## 2018-02-12 NOTE — ED Provider Notes (Addendum)
[facility] EMERGENCY DEPARTMENT Provider Note   CSN: [id number] Arrival date & time: 02/12/18  0725     History   Chief Complaint Chief Complaint  Patient presents with  . Cough    HPI John [name] is a 27 y.o. male.  Pt c/o cough in past few days. Cough episodic, persistent, moderate, non prod. Scratchy throat, no trouble swallowing. Also c/o sinus congestion, drainage and max sinus pain, not improved after a few days of antihistamine/decongestant therapy. subj fever. No specific known ill contacts. No gi symptoms, no nvd.   The history is provided by the patient and the spouse.  Cough  Pertinent negatives include no shortness of breath and no eye redness.    Past Medical History:  Diagnosis Date  . ADD (attention deficit disorder)   . Allergic rhinitis   . Asthma   . DKA (diabetic ketoacidoses) (HCC)   . Goiter, unspecified 02/04/2011  . Type I (juvenile type) diabetes mellitus without mention of complication, uncontrolled 02/04/2011    Patient Active Problem List   Diagnosis Date Noted  . Attention deficit hyperactivity disorder (ADHD) 04/19/2016  . Diabetic ketoacidosis without coma associated with type 1 diabetes mellitus (HCC)   . DKA, type 1 (HCC) 07/07/2014  . Tobacco abuse 07/07/2014  . Migraine headache 03/26/2014  . DKA (diabetic ketoacidoses) (HCC) 08/28/2012  . Dehydration 08/28/2012  . Leukocytosis 08/28/2012  . Alcohol intake above recommended sensible limits 03/29/2011  . Smoker 03/29/2011  . 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 Essential hypertension, benign 02/04/2011  . Goiter, unspecified 02/04/2011    Past Surgical History:  Procedure Laterality Date  . TONSILLECTOMY          Home Medications    Prior to Admission medications   Medication Sig Start Date End Date Taking? Authorizing Provider  insulin glargine (LANTUS) 100 UNIT/ML injection Inject 27 Units  into the skin at bedtime.   Yes [provider]  Aspirin-Acetaminophen-Caffeine (GOODY HEADACHE PO) Take 1 tablet by mouth daily as needed. For headache    [provider]  glucose blood (ONE TOUCH ULTRA TEST) test strip Use as instructed 3x daily 05/04/17   Dolores Pattyiccio, Angela C, DO  glucose blood test strip 1 each by Other route 2 (two) times daily. Use as instructed 01/03/13   Romero BellingEllison, Sean, MD  insulin aspart (NOVOLOG) 100 UNIT/ML injection Inject 1 Units into the skin as directed. Carb coverage and correction    [provider]  Insulin Pen Needle 31G X 8 MM MISC 1 each by Does not apply route 4 (four) times daily -  with meals and at bedtime. 03/24/15   Hoy RegisterNewlin, Enobong, MD  INSULIN SYRINGE .5CC/28G 28G X 1/2" 0.5 ML MISC Use to administer insulins as prescribed 07/10/14   Russella DarEllis, Allison L, NP    Family History Family History  Problem Relation Age of Onset  . Lupus Cousin   . Cancer Neg Hx     Social History Social History   Tobacco Use  . Smoking status: Current Every Day Smoker    Packs/day: 1.00    Years: 1.00    Pack years: 1.00    Types: Cigarettes  . Smokeless tobacco: Former NeurosurgeonUser    Quit date: 06/26/2013  Substance Use Topics  . Alcohol use: Yes    Comment: occ  . Drug use: No     Allergies   Patient has no known allergies.   Review of Systems  Review of Systems  Constitutional: Negative for diaphoresis.  HENT: Positive for congestion, sinus pressure and sinus pain.   Eyes: Negative for pain and redness.  Respiratory: Positive for cough. Negative for shortness of breath.   Cardiovascular:       Chest soreness w coughing, no exertional cp or discomfort.   Gastrointestinal: Negative for diarrhea and vomiting.  Musculoskeletal: Negative for neck pain and neck stiffness.  Skin: Negative for rash.     Physical Exam Updated Vital Signs BP 109/70   Pulse 88   Temp 98 F (36.7 C) (Oral)   Resp 20   Ht 1.753 m (5\' 9" )   Wt 70.3 kg (155  lb)   SpO2 100%   BMI 22.89 kg/m   Physical Exam  Constitutional: He appears well-developed and well-nourished. He appears distressed.  HENT:  Mouth/Throat: Oropharynx is clear and moist.  Nasal congestion, +max sinus pain/tenderness.   Eyes: Conjunctivae are normal.  Neck: Neck supple. No tracheal deviation present.  No stiffness or rigidity  Cardiovascular: Normal rate, regular rhythm, normal heart sounds and intact distal pulses. Exam reveals no gallop and no friction rub.  No murmur heard. Pulmonary/Chest: Effort normal and breath sounds normal. No accessory muscle usage. No respiratory distress.  Abdominal: Soft. He exhibits no distension. There is no tenderness.  No hsm.   Musculoskeletal: He exhibits no edema or tenderness.  Neurological: He is alert.  Skin: Skin is warm and dry. No rash noted.  Psychiatric: He has a normal mood and affect.  Nursing note and vitals reviewed.    ED Treatments / Results  Labs (all labs ordered are listed, but only abnormal results are displayed) Labs Reviewed - No data to display  EKG None  Radiology Dg Chest 2 View  Result Date: 02/12/2018 CLINICAL DATA:  Cough and congestion EXAM: CHEST - 2 VIEW COMPARISON:  06/21/2016 FINDINGS: The heart size and mediastinal contours are within normal limits. Both lungs are clear. The visualized skeletal structures are unremarkable. IMPRESSION: No active cardiopulmonary disease. Electronically Signed   By: Alcide Clever M.D.   On: 02/12/2018 08:15    Procedures Procedures (including critical care time)  Medications Ordered in ED Medications - No data to display   Initial Impression / Assessment and Plan / ED Course  I have reviewed the triage vital signs and the nursing notes.  Pertinent labs & imaging results that were available during my care of the patient were reviewed by me and considered in my medical decision making (see chart for details).  Xrays.   Pt notes no relief cough w  mucinex.   With sinus congestion, pain, tenderness not relieved w otc meds, suspect sinusitis. Confirmed nkda w pt. rx amox.  xrays reviewed - no pna.     Final Clinical Impressions(s) / ED Diagnoses   Final diagnoses:  None    ED Discharge Orders    None         Cathren Laine, MD 02/12/18 [phone]

## 2018-02-12 NOTE — ED Triage Notes (Signed)
Pt has runny nose, sneezing and cough x 2 days.  Denies fever, some chills.  Pt states he has headache and right chest hurts with cough.

## 2018-02-12 NOTE — Discharge Instructions (Addendum)
It was our pleasure to provide your ER care today - we hope that you feel better.  Take amoxicillin (antibiotic) as prescribed. Take claritin-d or zyrtec-d as need for congestion.   You may try taking tessalon as need for cough.   Take motrin or aleve as need for pain/symptom.  Avoid smoking.   Follow up primary care doctor in 1-2 weeks if symptoms fail to improve/resolve.

## 2018-10-13 MED ORDER — MUPIROCIN 2 % EX OINT
TOPICAL_OINTMENT | CUTANEOUS | Status: DC
Start: 2018-10-12 — End: 2018-10-13

## 2018-10-13 MED ORDER — INSULIN LISPRO 100 UNIT/ML ~~LOC~~ SOLN
1.00 | SUBCUTANEOUS | Status: DC
Start: 2018-10-12 — End: 2018-10-13

## 2018-10-13 MED ORDER — INSULIN GLARGINE 100 UNIT/ML ~~LOC~~ SOLN
1.00 | SUBCUTANEOUS | Status: DC
Start: ? — End: 2018-10-13

## 2018-10-13 MED ORDER — HYDROMORPHONE HCL 1 MG/ML IJ SOLN
0.20 | INTRAMUSCULAR | Status: DC
Start: ? — End: 2018-10-13

## 2018-10-13 MED ORDER — ONDANSETRON HCL 4 MG/2ML IJ SOLN
4.00 | INTRAMUSCULAR | Status: DC
Start: ? — End: 2018-10-13

## 2018-10-13 MED ORDER — GENERIC EXTERNAL MEDICATION
250.00 | Status: DC
Start: ? — End: 2018-10-13

## 2018-10-13 MED ORDER — SODIUM CHLORIDE 0.9 % IV SOLN
10.00 | INTRAVENOUS | Status: DC
Start: ? — End: 2018-10-13

## 2018-10-13 MED ORDER — GENERIC EXTERNAL MEDICATION
10.00 | Status: DC
Start: ? — End: 2018-10-13

## 2018-10-13 MED ORDER — GENERIC EXTERNAL MEDICATION
1.00 | Status: DC
Start: ? — End: 2018-10-13

## 2018-10-13 MED ORDER — KCL IN DEXTROSE-NACL 40-5-0.45 MEQ/L-%-% IV SOLN
150.00 | INTRAVENOUS | Status: DC
Start: ? — End: 2018-10-13

## 2018-10-13 MED ORDER — HYDROCODONE-ACETAMINOPHEN 5-325 MG PO TABS
1.00 | ORAL_TABLET | ORAL | Status: DC
Start: ? — End: 2018-10-13

## 2018-10-13 MED ORDER — GENERIC EXTERNAL MEDICATION
0.10 | Status: DC
Start: ? — End: 2018-10-13

## 2018-10-13 MED ORDER — INSULIN LISPRO 100 UNIT/ML ~~LOC~~ SOLN
1.00 | SUBCUTANEOUS | Status: DC
Start: ? — End: 2018-10-13

## 2019-05-22 ENCOUNTER — Encounter (HOSPITAL_BASED_OUTPATIENT_CLINIC_OR_DEPARTMENT_OTHER): Payer: Self-pay | Admitting: Emergency Medicine

## 2019-05-22 ENCOUNTER — Emergency Department (HOSPITAL_BASED_OUTPATIENT_CLINIC_OR_DEPARTMENT_OTHER): Payer: Self-pay

## 2019-05-22 ENCOUNTER — Other Ambulatory Visit: Payer: Self-pay

## 2019-05-22 ENCOUNTER — Emergency Department (HOSPITAL_BASED_OUTPATIENT_CLINIC_OR_DEPARTMENT_OTHER)
Admission: EM | Admit: 2019-05-22 | Discharge: 2019-05-22 | Disposition: A | Discharge status: Home / Self Care | Payer: Self-pay | Attending: Emergency Medicine | Admitting: Emergency Medicine

## 2019-05-22 DIAGNOSIS — E860 Dehydration: Secondary | ICD-10-CM | POA: Insufficient documentation

## 2019-05-22 DIAGNOSIS — I10 Essential (primary) hypertension: Secondary | ICD-10-CM | POA: Insufficient documentation

## 2019-05-22 DIAGNOSIS — F909 Attention-deficit hyperactivity disorder, unspecified type: Secondary | ICD-10-CM | POA: Insufficient documentation

## 2019-05-22 DIAGNOSIS — R739 Hyperglycemia, unspecified: Secondary | ICD-10-CM

## 2019-05-22 DIAGNOSIS — R112 Nausea with vomiting, unspecified: Secondary | ICD-10-CM | POA: Insufficient documentation

## 2019-05-22 DIAGNOSIS — E1065 Type 1 diabetes mellitus with hyperglycemia: Secondary | ICD-10-CM | POA: Insufficient documentation

## 2019-05-22 DIAGNOSIS — E1010 Type 1 diabetes mellitus with ketoacidosis without coma: Secondary | ICD-10-CM

## 2019-05-22 DIAGNOSIS — F17210 Nicotine dependence, cigarettes, uncomplicated: Secondary | ICD-10-CM | POA: Insufficient documentation

## 2019-05-22 LAB — CBG MONITORING, ED
Glucose-Capillary: 299 mg/dL — ABNORMAL HIGH (ref 70–99)
Glucose-Capillary: 245 mg/dL — ABNORMAL HIGH (ref 70–99)
Glucose-Capillary: 269 mg/dL — ABNORMAL HIGH (ref 70–99)
Glucose-Capillary: 228 mg/dL — ABNORMAL HIGH (ref 70–99)
Glucose-Capillary: 204 mg/dL — ABNORMAL HIGH (ref 70–99)
Glucose-Capillary: 147 mg/dL — ABNORMAL HIGH (ref 70–99)

## 2019-05-22 LAB — MONONUCLEOSIS SCREEN: Mono Screen: NEGATIVE

## 2019-05-22 LAB — URINALYSIS, ROUTINE W REFLEX MICROSCOPIC
Bilirubin Urine: NEGATIVE
Glucose, UA: >=500 mg/dL — AB
Ketones, ur: 40 mg/dL — AB
Leukocytes,Ua: NEGATIVE
Nitrite: NEGATIVE
Protein, ur: 100 mg/dL — AB
Specific Gravity, Urine: 1.015 (ref 1.005–1.030)
pH: 8 (ref 5.0–8.0)

## 2019-05-22 LAB — BASIC METABOLIC PANEL
Anion gap: 11 (ref 5–15)
BUN: 20 mg/dL (ref 6–20)
CO2: 24 mmol/L (ref 22–32)
Calcium: 8.6 mg/dL — ABNORMAL LOW (ref 8.9–10.3)
Chloride: 104 mmol/L (ref 98–111)
Creatinine, Ser: 0.92 mg/dL (ref 0.61–1.24)
GFR calc Af Amer: >60 mL/min (ref >60)
GFR calc non Af Amer: >60 mL/min (ref >60)
Glucose, Bld: 239 mg/dL — ABNORMAL HIGH (ref 70–99)
Potassium: 3.8 mmol/L (ref 3.5–5.1)
Sodium: 139 mmol/L (ref 135–145)

## 2019-05-22 LAB — LIPASE, BLOOD: Lipase: 21 U/L (ref 11–51)

## 2019-05-22 LAB — CBC
HCT: 45.7 % (ref 39.0–52.0)
Hemoglobin: 15.7 g/dL (ref 13.0–17.0)
MCH: 29.8 pg (ref 26.0–34.0)
MCHC: 34.4 g/dL (ref 30.0–36.0)
MCV: 86.7 fL (ref 80.0–100.0)
Platelets: 470 10*3/uL — ABNORMAL HIGH (ref 150–400)
RBC: 5.27 MIL/uL (ref 4.22–5.81)
RDW: 11.6 % (ref 11.5–15.5)
WBC: 17.4 10*3/uL — ABNORMAL HIGH (ref 4.0–10.5)
nRBC: 0 % (ref 0.0–0.2)

## 2019-05-22 LAB — COMPREHENSIVE METABOLIC PANEL
ALT: 18 U/L (ref 0–44)
AST: 16 U/L (ref 15–41)
Albumin: 4.9 g/dL (ref 3.5–5.0)
Alkaline Phosphatase: 69 U/L (ref 38–126)
Anion gap: 16 — ABNORMAL HIGH (ref 5–15)
BUN: 21 mg/dL — ABNORMAL HIGH (ref 6–20)
CO2: 27 mmol/L (ref 22–32)
Calcium: 9.7 mg/dL (ref 8.9–10.3)
Chloride: 96 mmol/L — ABNORMAL LOW (ref 98–111)
Creatinine, Ser: 1.17 mg/dL (ref 0.61–1.24)
GFR calc Af Amer: >60 mL/min (ref >60)
GFR calc non Af Amer: >60 mL/min (ref >60)
Glucose, Bld: 327 mg/dL — ABNORMAL HIGH (ref 70–99)
Potassium: 3.8 mmol/L (ref 3.5–5.1)
Sodium: 139 mmol/L (ref 135–145)
Total Bilirubin: 1.7 mg/dL — ABNORMAL HIGH (ref 0.3–1.2)
Total Protein: 7.8 g/dL (ref 6.5–8.1)

## 2019-05-22 LAB — URINALYSIS, MICROSCOPIC (REFLEX): WBC, UA: NONE SEEN WBC/hpf (ref 0–5)

## 2019-05-22 LAB — TSH: TSH: 1.2 u[IU]/mL (ref 0.350–4.500)

## 2019-05-22 LAB — GROUP A STREP BY PCR: Group A Strep by PCR: NOT DETECTED

## 2019-05-22 LAB — T4, FREE: Free T4: 1.25 ng/dL — ABNORMAL HIGH (ref 0.61–1.12)

## 2019-05-22 MED ORDER — ONDANSETRON HCL 4 MG/2ML IJ SOLN
4.0000 mg | Freq: Once | INTRAMUSCULAR | Status: AC | PRN
  Administered 2019-05-22: 4 mg via INTRAVENOUS
  Filled 2019-05-22: qty 2

## 2019-05-22 MED ORDER — ALUM & MAG HYDROXIDE-SIMETH 200-200-20 MG/5ML PO SUSP
30.0000 mL | Freq: Once | ORAL | Status: AC
  Administered 2019-05-22: 16:00:00 30 mL via ORAL
  Filled 2019-05-22: qty 30

## 2019-05-22 MED ORDER — SODIUM CHLORIDE 0.9 % IV BOLUS
1000.0000 mL | Freq: Once | INTRAVENOUS | Status: AC
  Administered 2019-05-22: 1000 mL via INTRAVENOUS

## 2019-05-22 MED ORDER — POTASSIUM CHLORIDE 10 MEQ/100ML IV SOLN
10.0000 meq | INTRAVENOUS | Status: AC
  Administered 2019-05-22 (×2): 10 meq via INTRAVENOUS
  Filled 2019-05-22 (×2): qty 100

## 2019-05-22 MED ORDER — SODIUM CHLORIDE 0.9 % IV BOLUS
1000.0000 mL | Freq: Once | INTRAVENOUS | Status: AC
  Administered 2019-05-22: 14:00:00 1000 mL via INTRAVENOUS

## 2019-05-22 MED ORDER — INSULIN REGULAR(HUMAN) IN NACL 100-0.9 UT/100ML-% IV SOLN
INTRAVENOUS | Status: DC
  Administered 2019-05-22: 1.9 [IU]/h via INTRAVENOUS
  Filled 2019-05-22: qty 100

## 2019-05-22 MED ORDER — LIDOCAINE VISCOUS HCL 2 % MT SOLN
15.0000 mL | Freq: Once | OROMUCOSAL | Status: AC
  Administered 2019-05-22: 16:00:00 15 mL via ORAL
  Filled 2019-05-22: qty 15

## 2019-05-22 MED ORDER — SODIUM CHLORIDE 0.9 % IV BOLUS
1000.0000 mL | Freq: Once | INTRAVENOUS | Status: AC
  Administered 2019-05-22: 11:00:00 1000 mL via INTRAVENOUS

## 2019-05-22 MED ORDER — DEXTROSE-NACL 5-0.45 % IV SOLN
INTRAVENOUS | Status: DC
  Administered 2019-05-22: 14:00:00 via INTRAVENOUS

## 2019-05-22 MED ORDER — ONDANSETRON HCL 4 MG/2ML IJ SOLN
4.0000 mg | Freq: Once | INTRAMUSCULAR | Status: AC
  Administered 2019-05-22: 13:00:00 4 mg via INTRAVENOUS
  Filled 2019-05-22: qty 2

## 2019-05-22 MED ORDER — ONDANSETRON 8 MG PO TBDP
ORAL_TABLET | ORAL | Status: AC
  Filled 2019-05-22: qty 1

## 2019-05-22 MED ORDER — ONDANSETRON 4 MG PO TBDP: 4.0000 mg | ORAL_TABLET | Freq: Three times a day (TID) | ORAL | 0 refills | Status: DC | PRN

## 2019-05-22 NOTE — ED Provider Notes (Signed)
[facility] Provider Note   CSN: [id number] Arrival date & time: 05/22/19  1027     History   Chief Complaint Chief Complaint  Patient presents with  . Hyperglycemia  . Emesis    HPI John [name] is a 28 y.o. male.     The history is provided by the patient, medical records and a significant other. No language interpreter was used.  Emesis Severity:  Severe Duration:  1 day Timing:  Constant Quality:  Stomach contents Progression:  Unchanged Chronicity:  Recurrent Recent urination:  Normal Relieved by:  Nothing Worsened by:  Nothing Ineffective treatments:  None tried Associated symptoms: abdominal pain and sore throat   Associated symptoms: no chills, no cough, no diarrhea, no fever, no headaches, no myalgias and no URI   Risk factors: diabetes     Past Medical History:  Diagnosis Date  . ADD (attention deficit disorder)   . Allergic rhinitis   . Asthma   . DKA (diabetic ketoacidoses) ([place])   . Goiter, unspecified 02/04/2011  . Type I (juvenile type) diabetes mellitus without mention of complication, uncontrolled 02/04/2011    Patient Active Problem List   Diagnosis Date Noted  . Attention deficit hyperactivity disorder (ADHD) 04/19/2016  . Diabetic ketoacidosis without coma associated with type 1 diabetes mellitus ([place])   . DKA, type 1 ([place]) 07/07/2014  . Tobacco abuse 07/07/2014  . Migraine headache 03/26/2014  . DKA (diabetic ketoacidoses) ([place]) 08/28/2012  . Dehydration 08/28/2012  . Leukocytosis 08/28/2012  . Alcohol intake above recommended sensible limits 03/29/2011  . Smoker 03/29/2011  . 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 Essential hypertension, benign 02/04/2011  . Goiter, unspecified 02/04/2011    Past Surgical History:  Procedure Laterality Date  . TONSILLECTOMY          Home Medications    Prior to Admission medications    Medication Sig Start Date End Date Taking? Authorizing Provider  glucose blood (ONE TOUCH ULTRA TEST) test strip Use as instructed 3x daily 05/04/17   Lucila Maine C, DO  glucose blood test strip 1 each by Other route 2 (two) times daily. Use as instructed 01/03/13   Renato Shin, MD  insulin aspart (NOVOLOG) 100 UNIT/ML injection Inject 1 Units into the skin as directed. Carb coverage and correction    [provider]  insulin glargine (LANTUS) 100 UNIT/ML injection Inject 27 Units into the skin at bedtime.    [provider]  Insulin Pen Needle 31G X 8 MM MISC 1 each by Does not apply route 4 (four) times daily -  with meals and at bedtime. 03/24/15   Charlott Rakes, MD  INSULIN SYRINGE .5CC/28G 28G X 1/2" 0.5 ML MISC Use to administer insulins as prescribed 07/10/14   Samella Parr, NP    Family History Family History  Problem Relation Age of Onset  . Lupus Cousin   . Cancer Neg Hx     Social History Social History   Tobacco Use  . Smoking status: Current Every Day Smoker    Packs/day: 1.00    Years: 1.00    Pack years: 1.00    Types: Cigarettes  . Smokeless tobacco: Former Systems developer    Quit date: 06/26/2013  Substance Use Topics  . Alcohol use: Yes    Comment: occ  . Drug use: No     Allergies   Patient has no known allergies.   Review of Systems  Review of Systems  Constitutional: Negative for chills and fever.  HENT: Positive for sore throat. Negative for congestion and rhinorrhea.   Eyes: Negative for visual disturbance.  Respiratory: Negative for cough, chest tightness, shortness of breath, wheezing and stridor.   Cardiovascular: Negative for chest pain, palpitations and leg swelling.  Gastrointestinal: Positive for abdominal pain, nausea and vomiting. Negative for constipation and diarrhea.  Genitourinary: Negative for dysuria and flank pain.  Musculoskeletal: Negative for back pain, myalgias, neck pain and neck stiffness.  Skin: Negative for  rash and wound.  Neurological: Negative for light-headedness, numbness and headaches.  Psychiatric/Behavioral: Negative for agitation.  All other systems reviewed and are negative.    Physical Exam Updated Vital Signs BP (!) 144/93 (BP Location: Left Arm)   Pulse (!) 113   Temp 98.3 F (36.8 C) (Oral)   Resp 20   Ht  (1.778 m)   Wt 69.2 kg   SpO2 100%   BMI 21.90 kg/m   Physical Exam Vitals signs and nursing note reviewed.  Constitutional:      General: He is not in acute distress.    Appearance: He is well-developed. He is ill-appearing. He is not toxic-appearing or diaphoretic.  HENT:     Head: Normocephalic and atraumatic.     Nose: No congestion or rhinorrhea.     Mouth/Throat:     Mouth: Mucous membranes are dry.     Pharynx: Posterior oropharyngeal erythema present. No oropharyngeal exudate.  Eyes:     Extraocular Movements: Extraocular movements intact.     Conjunctiva/sclera: Conjunctivae normal.     Pupils: Pupils are equal, round, and reactive to light.  Neck:     Musculoskeletal: Neck supple. No muscular tenderness.  Cardiovascular:     Rate and Rhythm: Regular rhythm. Tachycardia present.     Pulses: Normal pulses.     Heart sounds: No murmur.  Pulmonary:     Effort: Pulmonary effort is normal. No respiratory distress.     Breath sounds: Normal breath sounds. No wheezing, rhonchi or rales.  Chest:     Chest wall: No tenderness.  Abdominal:     General: Abdomen is flat.     Palpations: Abdomen is soft.     Tenderness: There is no abdominal tenderness.  Musculoskeletal:        General: No tenderness.     Right lower leg: No edema.     Left lower leg: No edema.  Skin:    General: Skin is warm and dry.     Capillary Refill: Capillary refill takes less than 2 seconds.     Findings: No erythema or rash.  Neurological:     General: No focal deficit present.     Mental Status: He is alert and oriented to person, place, and time.     Cranial  Nerves: No cranial nerve deficit.     Sensory: No sensory deficit.     Motor: No weakness.  Psychiatric:        Mood and Affect: Mood normal.      ED Treatments / Results  Labs (all labs ordered are listed, but only abnormal results are displayed) Labs Reviewed  CBC - Abnormal; Notable for the following components:      Result Value   WBC 17.4 (*)    Platelets 470 (*)    All other components within normal limits  URINALYSIS, ROUTINE W REFLEX MICROSCOPIC - Abnormal; Notable for the following components:   Glucose, UA >=500 (*)  Hgb urine dipstick SMALL (*)    Ketones, ur 40 (*)    Protein, ur 100 (*)    All other components within normal limits  COMPREHENSIVE METABOLIC PANEL - Abnormal; Notable for the following components:   Chloride 96 (*)    Glucose, Bld 327 (*)    BUN 21 (*)    Total Bilirubin 1.7 (*)    Anion gap 16 (*)    All other components within normal limits  URINALYSIS, MICROSCOPIC (REFLEX) - Abnormal; Notable for the following components:   Bacteria, UA RARE (*)    All other components within normal limits  BASIC METABOLIC PANEL - Abnormal; Notable for the following components:   Glucose, Bld 239 (*)    Calcium 8.6 (*)    All other components within normal limits  CBG MONITORING, ED - Abnormal; Notable for the following components:   Glucose-Capillary 299 (*)    All other components within normal limits  CBG MONITORING, ED - Abnormal; Notable for the following components:   Glucose-Capillary 269 (*)    All other components within normal limits  CBG MONITORING, ED - Abnormal; Notable for the following components:   Glucose-Capillary 245 (*)    All other components within normal limits  CBG MONITORING, ED - Abnormal; Notable for the following components:   Glucose-Capillary 228 (*)    All other components within normal limits  CBG MONITORING, ED - Abnormal; Notable for the following components:   Glucose-Capillary 204 (*)    All other components within  normal limits  GROUP A STREP BY PCR  LIPASE, BLOOD  MONONUCLEOSIS SCREEN    EKG None  Radiology Dg Chest Portable 1 View  Result Date: 05/22/2019 CLINICAL DATA:  Nausea, vomiting, hyperglycemia EXAM: PORTABLE CHEST 1 VIEW COMPARISON:  02/12/2018 FINDINGS: The heart size and mediastinal contours are within normal limits. Both lungs are clear. The visualized skeletal structures are unremarkable. IMPRESSION: Normal chest x-ray. Electronically Signed   By: Duanne Guess M.D.   On: 05/22/2019 13:38    Procedures Procedures (including critical care time)  CRITICAL CARE Performed by: Canary Brim Gibran Veselka Total critical care time: 35 minutes Critical care time was exclusive of separately billable procedures and treating other patients. Critical care was necessary to treat or prevent imminent or life-threatening deterioration. Critical care was time spent personally by me on the following activities: development of treatment plan with patient and/or surrogate as well as nursing, discussions with consultants, evaluation of patient's response to treatment, examination of patient, obtaining history from patient or surrogate, ordering and performing treatments and interventions, ordering and review of laboratory studies, ordering and review of radiographic studies, pulse oximetry and re-evaluation of patient's condition.   Medications Ordered in ED Medications  sodium chloride 0.9 % bolus 1,000 mL ( Intravenous Stopped 05/22/19 1208)  ondansetron (ZOFRAN) injection 4 mg (4 mg Intravenous Given 05/22/19 1112)  sodium chloride 0.9 % bolus 1,000 mL (0 mLs Intravenous Stopped 05/22/19 1334)  potassium chloride 10 mEq in 100 mL IVPB ( Intravenous Stopped 05/22/19 1534)  ondansetron (ZOFRAN) injection 4 mg (4 mg Intravenous Given 05/22/19 1327)  sodium chloride 0.9 % bolus 1,000 mL (0 mLs Intravenous Stopped 05/22/19 1558)  alum & mag hydroxide-simeth (MAALOX/MYLANTA) 200-200-20 MG/5ML suspension 30 mL  (30 mLs Oral Given 05/22/19 1554)    And  lidocaine (XYLOCAINE) 2 % viscous mouth solution 15 mL (15 mLs Oral Given 05/22/19 1554)  sodium chloride 0.9 % bolus 1,000 mL (1,000 mLs Intravenous New Bag/Given 05/22/19 1601)  Initial Impression / Assessment and Plan / ED Course  I have reviewed the triage vital signs and the nursing notes.  Pertinent labs & imaging results that were available during my care of the patient were reviewed by me and considered in my medical decision making (see chart for details).        John [name] is a 28 y.o. male with a past medical history significant for type 1 diabetes with prior DKA, hypertension, migraines, and ADHD who presents with nausea, vomiting, abdominal cramping, sore throat, malaise and fatigue.  Patient reports that he start having nausea vomiting last night and did not take his insulin shot.  He says that he went to dinner but no one else has been sick.  He reports he has had some sore throat today but denies any injury.  Denies runny nose congestion, cough.  Denies fevers, chills.  Denies any urinary or constipation or diarrhea symptoms.  He denies any other complaints.  Has not taken any new medications.  On exam, lungs are clear and chest is nontender.  Abdomen is nontender.  Back is nontender.  Flanks have no tenderness or CVA tenderness.  Patient is having nausea and vomiting.  Patient is tachycardic on arrival with a heart rate in the 120s.  Patient is afebrile.  Oropharyngeal exam shows some erythema but otherwise no evidence of PTA or RPA.  Normal neck range of motion.  Clinical aspect patient is in DKA given his constellation of symptoms.  It may been due to missing a dose of insulin versus occult infection with a sore throat and malaise.  Patient will given fluids and have labs.  Chest x-ray and urinalysis will be obtained to look for occult infection.  A strep swab and mono swab will also be collected.  Labs began to return showing  evidence of mild DKA.  Anion gap is 16 and glucose was over 300.  Patient will be continued on fluids and will have insulin drip started to help close the gap.  DKA order set was utilized.   Urinalysis does show evidence of ketones and patient has a leukocytosis.  Leukocytosis may be from infection or demargination with all the vomiting.  Lipase not elevated.  Liver function and kidney function reassuring.  Patient would like to discuss a disposition plan after his throat swabs and x-ray are completed.  He thinks he might want to go home and not be admitted for DKA management.  Anticipate reassessment after rehydration and work-up.   Repeat glucose has improved and anion gap has closed on repeat BMP.  Patient will have the insulin drip stopped however he still tachycardic, will give more fluids.  Patient's strep swab and mono test were negative.  Suspect viral pharyngitis causing the sore throat.  No other evidence of abnormality on throat exam with normal neck range of motion.  Anticipate discharge home if patient is able to tolerate p.o. and his heart rate has improved after fluids.  He is no longer appears to be in DKA.  Anticipate discharge with nausea medication if symptoms improve and he is feeling better.  Care transferred to Dr. Clarene Duke while awaiting reassessment after rehydration.   Final Clinical Impressions(s) / ED Diagnoses   Final diagnoses:  Dehydration  Hyperglycemia  Non-intractable vomiting with nausea, unspecified vomiting type  Diabetic ketoacidosis without coma associated with type 1 diabetes mellitus ([facility])     Clinical Impression: 1. Dehydration   2. Hyperglycemia   3. Non-intractable vomiting with nausea,  unspecified vomiting type   4. Diabetic ketoacidosis without coma associated with type 1 diabetes mellitus ([facility])     Disposition: Care transferred to Dr. Clarene Duke while awaiting reassessment after rehydration and p.o. challenge.  Anticipate discharge home for  outpatient management of hyperglycemia after he was treated for DKA here in the emergency department.  This note was prepared with assistance of Conservation officer, historic buildings. Occasional wrong-word or sound-a-like substitutions may have occurred due to the inherent limitations of voice recognition software.      Amjad Fikes, Canary Brim, MD 05/22/19 [phone]

## 2019-05-22 NOTE — ED Notes (Signed)
Notified RN Rod Holler

## 2019-05-22 NOTE — ED Provider Notes (Signed)
I received this patient in signout from Dr. Sherry Ruffing. Briefly, pt has IDDM and presented with vomiting and hyperglycemia.  He was found to have increased anion gap and was initially started on IV fluids and insulin drip.  Repeat lab work showed normal anion gap, improvement in blood glucose, and normal bicarb.  I suspect that part of his anion gap was related to dehydration.  His blood glucose continue to improve to 147 and heart rate improved to 110-113 on repeat exam. Pt wants to go home.  I have provided with Zofran and emphasized the importance of continued hydration at home as well as strict compliance with his diabetes medications.  Instructed to follow-up with his PCP closely and I have extensively reviewed return precautions.  He voiced understanding.   John [name], Wenda Overland, MD 05/22/19 [phone]

## 2019-05-22 NOTE — ED Triage Notes (Signed)
Pt started N/V last night.  Blood sugars high at home.  Pt states he cannot remember what his sugars have been running recently.  Pt did take pm dose of long acting insulin.

## 2020-03-02 ENCOUNTER — Other Ambulatory Visit: Payer: Self-pay

## 2020-03-02 ENCOUNTER — Emergency Department (HOSPITAL_BASED_OUTPATIENT_CLINIC_OR_DEPARTMENT_OTHER)
Admission: EM | Admit: 2020-03-02 | Discharge: 2020-03-02 | Disposition: A | Discharge status: Home / Self Care | Payer: 59 | Attending: Emergency Medicine | Admitting: Emergency Medicine

## 2020-03-02 ENCOUNTER — Encounter (HOSPITAL_BASED_OUTPATIENT_CLINIC_OR_DEPARTMENT_OTHER): Payer: Self-pay | Admitting: Emergency Medicine

## 2020-03-02 DIAGNOSIS — Z5321 Procedure and treatment not carried out due to patient leaving prior to being seen by health care provider: Secondary | ICD-10-CM | POA: Insufficient documentation

## 2020-03-02 LAB — COMPREHENSIVE METABOLIC PANEL
ALT: 13 U/L (ref 0–44)
AST: 12 U/L — ABNORMAL LOW (ref 15–41)
Albumin: 4 g/dL (ref 3.5–5.0)
Alkaline Phosphatase: 86 U/L (ref 38–126)
Anion gap: 8 (ref 5–15)
BUN: 23 mg/dL — ABNORMAL HIGH (ref 6–20)
CO2: 28 mmol/L (ref 22–32)
Calcium: 8.7 mg/dL — ABNORMAL LOW (ref 8.9–10.3)
Chloride: 99 mmol/L (ref 98–111)
Creatinine, Ser: 1.31 mg/dL — ABNORMAL HIGH (ref 0.61–1.24)
GFR calc Af Amer: >60 mL/min (ref >60)
GFR calc non Af Amer: >60 mL/min (ref >60)
Glucose, Bld: 372 mg/dL — ABNORMAL HIGH (ref 70–99)
Potassium: 5.1 mmol/L (ref 3.5–5.1)
Sodium: 135 mmol/L (ref 135–145)
Total Bilirubin: 0.5 mg/dL (ref 0.3–1.2)
Total Protein: 6.7 g/dL (ref 6.5–8.1)

## 2020-03-02 LAB — URINALYSIS, MICROSCOPIC (REFLEX)
Bacteria, UA: NONE SEEN
WBC, UA: NONE SEEN WBC/hpf (ref 0–5)

## 2020-03-02 LAB — URINALYSIS, ROUTINE W REFLEX MICROSCOPIC
Bilirubin Urine: NEGATIVE
Glucose, UA: >=500 mg/dL — AB
Ketones, ur: NEGATIVE mg/dL
Leukocytes,Ua: NEGATIVE
Nitrite: NEGATIVE
Protein, ur: 30 mg/dL — AB
Specific Gravity, Urine: 1.015 (ref 1.005–1.030)
pH: 6.5 (ref 5.0–8.0)

## 2020-03-02 LAB — CBC
HCT: 42.8 % (ref 39.0–52.0)
Hemoglobin: 14.4 g/dL (ref 13.0–17.0)
MCH: 30.1 pg (ref 26.0–34.0)
MCHC: 33.6 g/dL (ref 30.0–36.0)
MCV: 89.4 fL (ref 80.0–100.0)
Platelets: 414 10*3/uL — ABNORMAL HIGH (ref 150–400)
RBC: 4.79 MIL/uL (ref 4.22–5.81)
RDW: 11.6 % (ref 11.5–15.5)
WBC: 14.8 10*3/uL — ABNORMAL HIGH (ref 4.0–10.5)
nRBC: 0 % (ref 0.0–0.2)

## 2020-03-02 LAB — CBG MONITORING, ED: Glucose-Capillary: 352 mg/dL — ABNORMAL HIGH (ref 70–99)

## 2020-03-02 LAB — LIPASE, BLOOD: Lipase: 49 U/L (ref 11–51)

## 2020-03-02 NOTE — ED Triage Notes (Signed)
abd pain with diarrhea this morning. States he broke out in a sweat. Reports blood sugar over 400 today. He is compliant with his meds.

## 2022-08-22 HISTORY — PX: EYE SURGERY: SHX253

## 2022-10-13 NOTE — Progress Notes (Signed)
[facility] Note  10/14/2022     CHIEF COMPLAINT Patient presents for Retina Evaluation   HISTORY OF PRESENT ILLNESS: John [name] is a 32 y.o. male who presents to the clinic today for:   HPI     Retina Evaluation   In both eyes.  This started 2 months ago.  Associated Symptoms Floaters, Distortion and Photophobia.  Negative for Flashes.  Context:  distance vision, mid-range vision, near vision, reading, watching TV and driving.  I, the attending physician,  performed the HPI with the patient and updated documentation appropriately.        Comments   Patient is here today based on a referral from Dr. Truman Hayward for a diabetic evaluation. He feels that the vision is really blurry started one eye and now both are blurry. He feels that the eyes are sticky. He is unsure of his blood sugar and he does not know his A1c.       Last edited by Bernarda Caffey, MD on 10/14/2022  4:17 PM.    Patient states vision blurry, starting in one eye and now both eyes are blurry. Started in December 2023, but last 2 weeks vision has gotten worse. Patient does not know blood sugar and unsure of A1c. Diagnosed at age 39 with diabetes. Has not been to see a doctor for the past 4 years. Patient has been unable to work and has no insurance. Is currently investigating eligibility for Medicaid. Patient states has test kit at home to check blood sugar. Patient reports blood sugar fluctuates often.   Referring physician: Charlott Rakes, MD [place],  [place] [address]  HISTORICAL INFORMATION:   Selected notes from the MEDICAL RECORD NUMBER Referred by Dr. Marin Comment for concern of DME LEE:  Ocular Hx- PMH-    CURRENT MEDICATIONS: No current outpatient medications on file. (Ophthalmic Drugs)   No current facility-administered medications for this visit. (Ophthalmic Drugs)   Current Outpatient Medications (Other)  Medication Sig   glucose blood (ONE TOUCH  ULTRA TEST) test strip Use as instructed 3x daily   glucose blood test strip 1 each by Other route 2 (two) times daily. Use as instructed   insulin aspart (NOVOLOG) 100 UNIT/ML injection Inject 1 Units into the skin as directed. Carb coverage and correction   insulin glargine (LANTUS) 100 UNIT/ML injection Inject 27 Units into the skin at bedtime.   Insulin Pen Needle 31G X 8 MM MISC 1 each by Does not apply route 4 (four) times daily -  with meals and at bedtime.   INSULIN SYRINGE .5CC/28G 28G X 1/2" 0.5 ML MISC Use to administer insulins as prescribed   ondansetron (ZOFRAN ODT) 4 MG disintegrating tablet Take 1 tablet (4 mg total) by mouth every 8 (eight) hours as needed for nausea or vomiting.   No current facility-administered medications for this visit. (Other)      REVIEW OF SYSTEMS: ROS   Positive for: Endocrine, Eyes Negative for: Constitutional, Gastrointestinal, Neurological, Skin, Genitourinary, Musculoskeletal, HENT, Cardiovascular, Respiratory, Psychiatric, Allergic/Imm, Heme/Lymph Last edited by Jobe Marker, COT on 10/14/2022 11:14 AM.       ALLERGIES No Known Allergies  PAST MEDICAL HISTORY Past Medical History:  Diagnosis Date   ADD (attention deficit disorder)    Allergic rhinitis    Asthma    DKA (diabetic ketoacidoses)    Goiter, unspecified 02/04/2011   Type I (juvenile type) diabetes mellitus without mention of complication, uncontrolled  02/04/2011   Past Surgical History:  Procedure Laterality Date   TONSILLECTOMY      FAMILY HISTORY Family History  Problem Relation Age of Onset   Lupus Cousin    Cancer Neg Hx     SOCIAL HISTORY Social History   Tobacco Use   Smoking status: Every Day    Packs/day: 1.00    Years: 1.00    Total pack years: 1.00    Types: Cigarettes   Smokeless tobacco: Former    Quit date: 06/26/2013  Vaping Use   Vaping Use: Never used  Substance Use Topics   Alcohol use: Yes    Comment: occ   Drug use: No          OPHTHALMIC EXAM:  Base Eye Exam     Visual Acuity (Snellen - Linear)       Right Left   Dist [place] CF at 3' 20/50 +1   Dist ph  NI NI         Tonometry (Tonopen, 9:36 AM)       Right Left   Pressure 18 15         Pupils       Dark Light Shape React APD   Right 3 3 Round Minimal None   Left 3 3 Round Minimal None         Visual Fields       Left Right    Full Full         Extraocular Movement       Right Left    Full, Ortho Full, Ortho         Neuro/Psych     Oriented x3: Yes         Dilation     Both eyes: 1.0% Mydriacyl, 2.5% Phenylephrine @ 9:32 AM           Slit Lamp and Fundus Exam     Slit Lamp Exam       Right Left   Lids/Lashes Normal Normal   Conjunctiva/Sclera white and quiet white and quiet   Cornea clear clear   Anterior Chamber deep and quiet deep and quiet   Iris round, dilated; no NVI round, dilated; no NVI   Lens trace PSC trace PSC   Anterior Vitreous syneresis, +RBC, +fibrosis, blood stained vitreous condensations syneresis, +RBC, blood stained vit condensations         Fundus Exam       Right Left   Disc +fibrosis; disc obscured by heme, +NVD pink and sharp, +fine NVD   C/D Ratio  0.2   Macula blunted foveal reflex, severe edema, tractional fibrosis nasally central SRF/edema, scattered MA, DBH, and exudates   Vessels tortuous, severe attenuation, venous beading, +NVE attenuated, tortuous, +NVE, +venous beading   Periphery attached, scattered MA/DBH greatest posteriorly attached, scatttered MA/DBH greatest posteriorly            IMAGING AND PROCEDURES  Imaging and Procedures for 10/14/2022  OCT, Retina - OU - Both Eyes       Right Eye Quality was good. Central Foveal Thickness: 787. Progression has no prior data. Findings include no SRF, abnormal foveal contour, intraretinal hyper-reflective material, epiretinal membrane, intraretinal fluid, macular pucker, vitreous traction (Massive central edema;  vitreous traction emanating from disc).   Left Eye Quality was good. Central Foveal Thickness: 920. Progression has no prior data. Findings include abnormal foveal contour, epiretinal membrane, intraretinal fluid, macular pucker, subretinal fluid, vitreomacular adhesion (Central SRF/diabetic retinal detachment).  Notes *Images captured and stored on drive  Diagnosis / Impression:  PDR with edema OU OD: Massive central edema; vitreous traction emanating from disc OS: Central SRF/diabetic retinal detachment; +edema   Clinical management:  See below  Abbreviations: NFP - Normal foveal profile. CME - cystoid macular edema. PED - pigment epithelial detachment. IRF - intraretinal fluid. SRF - subretinal fluid. EZ - ellipsoid zone. ERM - epiretinal membrane. ORA - outer retinal atrophy. ORT - outer retinal tubulation. SRHM - subretinal hyper-reflective material. IRHM - intraretinal hyper-reflective material      Fluorescein Angiography Optos (Transit OD)       Right Eye Progression has no prior data. Early phase findings include blockage, staining, microaneurysm, neovascularization disc, retinal neovascularization, vascular perfusion defect. Mid/Late phase findings include blockage, leakage, staining, microaneurysm, neovascularization disc, retinal neovascularization, vascular perfusion defect (Florid NVD with surrounding blockage; large patches of non-perfusion, +leakage from NV and MA).   Left Eye Progression has no prior data. Early phase findings include microaneurysm, neovascularization disc, retinal neovascularization, vascular perfusion defect. Mid/Late phase findings include leakage, microaneurysm, neovascularization disc, retinal neovascularization, vascular perfusion defect ([place] NVD with surrounding blockage; large patches of non-perfusion, +leakage from NV and MA).   Notes Images stored on drive;   Impression: Severe PDR OU OD: Florid NVD with surrounding blockage; large  patches of non-perfusion, +leakage from NV and MA OS: Florid NVD and NVE w/ leakage; large patches of non-perfusion, leaking MA             ASSESSMENT/PLAN:    ICD-10-CM   1. 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 Essential hypertension  I10     3. Hypertensive retinopathy of both eyes  H35.033       PDR with diabetic macular edema OU         - The incidence, risk factors for progression, natural history and treatment options for diabetic retinopathy  were discussed with patient.           - The need for close monitoring of blood glucose, blood pressure, and serum lipids, avoiding cigarette or any type of tobacco, and the need for long term follow up was also discussed with patient.         - BCVA OD : CF'@3'$ ', OS: 20/50+1         - FA shows florid NVD with surrounding blockage; large patches of non-perfusion, +leakage from NV and MA OU         - OCT OD massive central edema; vitreous traction emanating from disc, OS: central SRF/diabetic retinal detachment; edema         The natural history, pathology, and characteristics of diabetic macular edema discussed with patient.  A generalized discussion of the major clinical trials concerning treatment of diabetic macular edema (ETDRS, DCT, SCORE, RISE / RIDE, and ongoing DRCR net studies) was completed.  This discussion included mention of the various approaches to treating diabetic macular edema (observation, laser photocoagulation, anti-VEGF injections with lucentis / Avastin / Eylea, steroid injections with Kenalog / Ozurdex, and intraocular surgery with vitrectomy).  The goal hemoglobin A1C of 6-7 was discussed, as well as importance of smoking cessation and hypertension control.  Need for ongoing treatment and monitoring were specifically discussed with reference to chronic  nature of diabetic macular edema.         [facility] work [facility]         - pt wishes to proceed         - RBA of procedure discussed, questions answered         - informed consent obtained and signed         - see procedure note         - f/u in 4 wks  2, 3. Hypertensive retinopathy OU - discussed importance of tight BP control - monitor    4. Gaffney cataracts OU - The symptoms of cataract, surgical options, and treatments and risks were discussed with patient. - discussed diagnosis and progression - not yet visually significant - monitor for now    Ophthalmic Meds Ordered this visit:  No orders of the defined types were placed in this encounter.      Return if symptoms worsen or fail to improve.  There are no Patient Instructions on file for this visit.   Explained the diagnoses, plan, and follow up with the patient and they expressed understanding.  Patient expressed understanding of the importance of proper follow up care.   This document serves as a record of services personally performed by Gardiner Sleeper, MD, PhD. It was created on their behalf by Roselee Nova, COMT. The creation of this record is the provider's dictation and/or activities during the visit.  Electronically signed by: Roselee Nova, COMT 10/14/22 4:24 PM     Gardiner Sleeper, M.D., Ph.D. Diseases & Surgery of the Retina and Vitreous [facility]     Abbreviations: M myopia (nearsighted); A astigmatism; H hyperopia (farsighted); P presbyopia; Mrx spectacle prescription;  CTL contact lenses; OD right eye; OS left eye; OU both eyes  XT exotropia; ET esotropia; PEK punctate epithelial keratitis; PEE punctate epithelial erosions; DES dry eye syndrome; MGD meibomian gland dysfunction; ATs artificial tears; PFAT's preservative free artificial tears; [place] nuclear sclerotic cataract; PSC posterior subcapsular cataract; ERM  epi-retinal membrane; PVD posterior vitreous detachment; RD retinal detachment; DM diabetes mellitus; DR diabetic retinopathy; NPDR non-proliferative diabetic retinopathy; PDR proliferative diabetic retinopathy; CSME clinically significant macular edema; DME diabetic macular edema; dbh dot blot hemorrhages; CWS cotton wool spot; POAG primary open angle glaucoma; C/D cup-to-disc ratio; HVF humphrey visual field; GVF goldmann visual field; OCT optical coherence tomography; IOP intraocular pressure; BRVO Branch retinal vein occlusion; CRVO central retinal vein occlusion; CRAO central retinal artery occlusion; BRAO branch retinal artery occlusion; RT retinal tear; SB scleral buckle; PPV pars plana vitrectomy; VH Vitreous hemorrhage; PRP panretinal laser photocoagulation; IVK intravitreal kenalog; VMT vitreomacular traction; MH Macular hole;  NVD neovascularization of the disc; NVE neovascularization elsewhere; AREDS age related eye disease study; ARMD age related macular degeneration; POAG primary open angle glaucoma; EBMD epithelial/anterior basement membrane dystrophy; ACIOL anterior chamber intraocular lens; IOL intraocular lens; PCIOL posterior chamber intraocular lens; Phaco/IOL phacoemulsification with intraocular lens placement; [place] photorefractive keratectomy; LASIK laser assisted in situ keratomileusis; HTN hypertension; DM diabetes mellitus; COPD chronic obstructive pulmonary disease

## 2022-10-14 ENCOUNTER — Encounter (INDEPENDENT_AMBULATORY_CARE_PROVIDER_SITE_OTHER): Payer: Self-pay | Admitting: Ophthalmology

## 2022-10-14 ENCOUNTER — Ambulatory Visit (INDEPENDENT_AMBULATORY_CARE_PROVIDER_SITE_OTHER): Payer: Self-pay | Admitting: Ophthalmology

## 2022-10-14 DIAGNOSIS — E103513 Type 1 diabetes mellitus with proliferative diabetic retinopathy with macular edema, bilateral: Secondary | ICD-10-CM

## 2022-10-14 DIAGNOSIS — I10 Essential (primary) hypertension: Secondary | ICD-10-CM

## 2022-10-14 DIAGNOSIS — H35033 Hypertensive retinopathy, bilateral: Secondary | ICD-10-CM

## 2022-10-14 DIAGNOSIS — E1136 Type 2 diabetes mellitus with diabetic cataract: Secondary | ICD-10-CM

## 2022-10-14 DIAGNOSIS — H3581 Retinal edema: Secondary | ICD-10-CM

## 2022-10-20 ENCOUNTER — Encounter (HOSPITAL_BASED_OUTPATIENT_CLINIC_OR_DEPARTMENT_OTHER): Payer: Self-pay | Admitting: Emergency Medicine

## 2022-10-20 ENCOUNTER — Other Ambulatory Visit: Payer: Self-pay

## 2022-10-20 ENCOUNTER — Emergency Department (HOSPITAL_BASED_OUTPATIENT_CLINIC_OR_DEPARTMENT_OTHER): Payer: Commercial Managed Care - HMO

## 2022-10-20 ENCOUNTER — Inpatient Hospital Stay (HOSPITAL_BASED_OUTPATIENT_CLINIC_OR_DEPARTMENT_OTHER)
Admission: EM | Admit: 2022-10-20 | Discharge: 2022-10-29 | DRG: 673 | Disposition: A | Discharge status: Home / Self Care | Payer: Commercial Managed Care - HMO | Attending: Internal Medicine | Admitting: Internal Medicine

## 2022-10-20 DIAGNOSIS — Z597 Insufficient social insurance and welfare support: Secondary | ICD-10-CM

## 2022-10-20 DIAGNOSIS — R1013 Epigastric pain: Secondary | ICD-10-CM | POA: Diagnosis not present

## 2022-10-20 DIAGNOSIS — I3139 Other pericardial effusion (noninflammatory): Secondary | ICD-10-CM | POA: Diagnosis present

## 2022-10-20 DIAGNOSIS — J90 Pleural effusion, not elsewhere classified: Secondary | ICD-10-CM | POA: Diagnosis present

## 2022-10-20 DIAGNOSIS — E1065 Type 1 diabetes mellitus with hyperglycemia: Secondary | ICD-10-CM | POA: Diagnosis present

## 2022-10-20 DIAGNOSIS — J9811 Atelectasis: Secondary | ICD-10-CM | POA: Diagnosis present

## 2022-10-20 DIAGNOSIS — Z791 Long term (current) use of non-steroidal anti-inflammatories (NSAID): Secondary | ICD-10-CM | POA: Diagnosis not present

## 2022-10-20 DIAGNOSIS — Z794 Long term (current) use of insulin: Secondary | ICD-10-CM | POA: Diagnosis not present

## 2022-10-20 DIAGNOSIS — D631 Anemia in chronic kidney disease: Secondary | ICD-10-CM | POA: Diagnosis present

## 2022-10-20 DIAGNOSIS — E1122 Type 2 diabetes mellitus with diabetic chronic kidney disease: Secondary | ICD-10-CM | POA: Diagnosis not present

## 2022-10-20 DIAGNOSIS — J690 Pneumonitis due to inhalation of food and vomit: Secondary | ICD-10-CM | POA: Diagnosis present

## 2022-10-20 DIAGNOSIS — D649 Anemia, unspecified: Secondary | ICD-10-CM | POA: Diagnosis not present

## 2022-10-20 DIAGNOSIS — R109 Unspecified abdominal pain: Secondary | ICD-10-CM | POA: Diagnosis present

## 2022-10-20 DIAGNOSIS — Z720 Tobacco use: Secondary | ICD-10-CM | POA: Diagnosis not present

## 2022-10-20 DIAGNOSIS — H538 Other visual disturbances: Secondary | ICD-10-CM | POA: Diagnosis present

## 2022-10-20 DIAGNOSIS — N185 Chronic kidney disease, stage 5: Secondary | ICD-10-CM | POA: Diagnosis not present

## 2022-10-20 DIAGNOSIS — Z992 Dependence on renal dialysis: Secondary | ICD-10-CM

## 2022-10-20 DIAGNOSIS — N186 End stage renal disease: Secondary | ICD-10-CM | POA: Diagnosis present

## 2022-10-20 DIAGNOSIS — F17210 Nicotine dependence, cigarettes, uncomplicated: Secondary | ICD-10-CM | POA: Diagnosis present

## 2022-10-20 DIAGNOSIS — R059 Cough, unspecified: Secondary | ICD-10-CM | POA: Diagnosis not present

## 2022-10-20 DIAGNOSIS — I120 Hypertensive chronic kidney disease with stage 5 chronic kidney disease or end stage renal disease: Secondary | ICD-10-CM | POA: Diagnosis present

## 2022-10-20 DIAGNOSIS — Z1152 Encounter for screening for COVID-19: Secondary | ICD-10-CM

## 2022-10-20 DIAGNOSIS — R0609 Other forms of dyspnea: Secondary | ICD-10-CM | POA: Diagnosis not present

## 2022-10-20 DIAGNOSIS — I10 Essential (primary) hypertension: Secondary | ICD-10-CM | POA: Diagnosis not present

## 2022-10-20 DIAGNOSIS — R000 Tachycardia, unspecified: Secondary | ICD-10-CM | POA: Diagnosis present

## 2022-10-20 DIAGNOSIS — K5900 Constipation, unspecified: Secondary | ICD-10-CM | POA: Diagnosis not present

## 2022-10-20 DIAGNOSIS — N179 Acute kidney failure, unspecified: Secondary | ICD-10-CM | POA: Diagnosis present

## 2022-10-20 DIAGNOSIS — J45909 Unspecified asthma, uncomplicated: Secondary | ICD-10-CM | POA: Diagnosis present

## 2022-10-20 DIAGNOSIS — R809 Proteinuria, unspecified: Secondary | ICD-10-CM | POA: Diagnosis present

## 2022-10-20 DIAGNOSIS — E8809 Other disorders of plasma-protein metabolism, not elsewhere classified: Secondary | ICD-10-CM | POA: Diagnosis present

## 2022-10-20 DIAGNOSIS — N289 Disorder of kidney and ureter, unspecified: Secondary | ICD-10-CM | POA: Diagnosis present

## 2022-10-20 DIAGNOSIS — R112 Nausea with vomiting, unspecified: Secondary | ICD-10-CM

## 2022-10-20 DIAGNOSIS — E1022 Type 1 diabetes mellitus with diabetic chronic kidney disease: Secondary | ICD-10-CM | POA: Diagnosis present

## 2022-10-20 DIAGNOSIS — Z01810 Encounter for preprocedural cardiovascular examination: Secondary | ICD-10-CM | POA: Diagnosis not present

## 2022-10-20 HISTORY — DX: Type 1 diabetes mellitus with ketoacidosis without coma: E10.10

## 2022-10-20 LAB — COMPREHENSIVE METABOLIC PANEL
ALT: 13 U/L (ref 0–44)
AST: 15 U/L (ref 15–41)
Albumin: 3.3 g/dL — ABNORMAL LOW (ref 3.5–5.0)
Alkaline Phosphatase: 65 U/L (ref 38–126)
Anion gap: 12 (ref 5–15)
BUN: 64 mg/dL — ABNORMAL HIGH (ref 6–20)
CO2: 24 mmol/L (ref 22–32)
Calcium: 8.8 mg/dL — ABNORMAL LOW (ref 8.9–10.3)
Chloride: 100 mmol/L (ref 98–111)
Creatinine, Ser: 6.69 mg/dL — ABNORMAL HIGH (ref 0.61–1.24)
GFR, Estimated: 11 mL/min — ABNORMAL LOW (ref >60)
Glucose, Bld: 331 mg/dL — ABNORMAL HIGH (ref 70–99)
Potassium: 3.9 mmol/L (ref 3.5–5.1)
Sodium: 136 mmol/L (ref 135–145)
Total Bilirubin: 0.2 mg/dL — ABNORMAL LOW (ref 0.3–1.2)
Total Protein: 5.2 g/dL — ABNORMAL LOW (ref 6.5–8.1)

## 2022-10-20 LAB — LIPASE, BLOOD: Lipase: 31 U/L (ref 11–51)

## 2022-10-20 LAB — CBC WITH DIFFERENTIAL/PLATELET
Abs Immature Granulocytes: 0.04 10*3/uL (ref 0.00–0.07)
Basophils Absolute: 0.1 10*3/uL (ref 0.0–0.1)
Basophils Relative: 1 %
Eosinophils Absolute: 0.2 10*3/uL (ref 0.0–0.5)
Eosinophils Relative: 2 %
HCT: 27.2 % — ABNORMAL LOW (ref 39.0–52.0)
Hemoglobin: 9.6 g/dL — ABNORMAL LOW (ref 13.0–17.0)
Immature Granulocytes: 0 %
Lymphocytes Relative: 17 %
Lymphs Abs: 2.1 10*3/uL (ref 0.7–4.0)
MCH: 29.5 pg (ref 26.0–34.0)
MCHC: 35.3 g/dL (ref 30.0–36.0)
MCV: 83.7 fL (ref 80.0–100.0)
Monocytes Absolute: 0.6 10*3/uL (ref 0.1–1.0)
Monocytes Relative: 5 %
Neutro Abs: 9 10*3/uL — ABNORMAL HIGH (ref 1.7–7.7)
Neutrophils Relative %: 75 %
Platelets: 373 10*3/uL (ref 150–400)
RBC: 3.25 MIL/uL — ABNORMAL LOW (ref 4.22–5.81)
RDW: 13.1 % (ref 11.5–15.5)
WBC: 12 10*3/uL — ABNORMAL HIGH (ref 4.0–10.5)
nRBC: 0 % (ref 0.0–0.2)

## 2022-10-20 LAB — URINALYSIS, ROUTINE W REFLEX MICROSCOPIC
Bilirubin Urine: NEGATIVE
Glucose, UA: 250 mg/dL — AB
Ketones, ur: NEGATIVE mg/dL
Leukocytes,Ua: NEGATIVE
Nitrite: NEGATIVE
Protein, ur: >=300 mg/dL — AB
Specific Gravity, Urine: 1.02 (ref 1.005–1.030)
pH: 6 (ref 5.0–8.0)

## 2022-10-20 LAB — RESP PANEL BY RT-PCR (RSV, FLU A&B, COVID)  RVPGX2
Influenza A by PCR: NEGATIVE
Influenza B by PCR: NEGATIVE
Resp Syncytial Virus by PCR: NEGATIVE
SARS Coronavirus 2 by RT PCR: NEGATIVE

## 2022-10-20 LAB — SALICYLATE LEVEL: Salicylate Lvl: <7 mg/dL — ABNORMAL LOW (ref 7.0–30.0)

## 2022-10-20 LAB — CBG MONITORING, ED: Glucose-Capillary: 303 mg/dL — ABNORMAL HIGH (ref 70–99)

## 2022-10-20 LAB — URINALYSIS, MICROSCOPIC (REFLEX): WBC, UA: NONE SEEN WBC/hpf (ref 0–5)

## 2022-10-20 MED ORDER — METOPROLOL TARTRATE 5 MG/5ML IV SOLN
5.0000 mg | Freq: Once | INTRAVENOUS | Status: AC
  Administered 2022-10-20: 5 mg via INTRAVENOUS
  Filled 2022-10-20: qty 5

## 2022-10-20 MED ORDER — MORPHINE SULFATE (PF) 4 MG/ML IV SOLN
4.0000 mg | Freq: Once | INTRAVENOUS | Status: AC
  Administered 2022-10-20: 4 mg via INTRAVENOUS
  Filled 2022-10-20: qty 1

## 2022-10-20 MED ORDER — ALUM & MAG HYDROXIDE-SIMETH 200-200-20 MG/5ML PO SUSP
30.0000 mL | Freq: Once | ORAL | Status: AC
  Administered 2022-10-20: 30 mL via ORAL
  Filled 2022-10-20: qty 30

## 2022-10-20 MED ORDER — LACTATED RINGERS IV BOLUS
1000.0000 mL | Freq: Once | INTRAVENOUS | Status: AC
  Administered 2022-10-20: 1000 mL via INTRAVENOUS

## 2022-10-20 MED ORDER — FAMOTIDINE IN NACL 20-0.9 MG/50ML-% IV SOLN
20.0000 mg | Freq: Once | INTRAVENOUS | Status: AC
  Administered 2022-10-20: 20 mg via INTRAVENOUS
  Filled 2022-10-20: qty 50

## 2022-10-20 MED ORDER — ACETAMINOPHEN 325 MG PO TABS
650.0000 mg | ORAL_TABLET | Freq: Once | ORAL | Status: AC
  Administered 2022-10-20: 650 mg via ORAL
  Filled 2022-10-20: qty 2

## 2022-10-20 MED ORDER — PANTOPRAZOLE SODIUM 40 MG PO TBEC
40.0000 mg | DELAYED_RELEASE_TABLET | Freq: Two times a day (BID) | ORAL | Status: DC
  Administered 2022-10-20 – 2022-10-29 (×18): 40 mg via ORAL
  Filled 2022-10-20 (×18): qty 1

## 2022-10-20 MED ORDER — LACTATED RINGERS IV SOLN: INTRAVENOUS | Status: DC

## 2022-10-20 MED ORDER — HYDRALAZINE HCL 20 MG/ML IJ SOLN
10.0000 mg | Freq: Once | INTRAMUSCULAR | Status: AC
  Administered 2022-10-20: 10 mg via INTRAVENOUS
  Filled 2022-10-20: qty 1

## 2022-10-20 MED ORDER — DIPHENHYDRAMINE HCL 50 MG/ML IJ SOLN
25.0000 mg | Freq: Once | INTRAMUSCULAR | Status: AC
  Administered 2022-10-20: 25 mg via INTRAVENOUS
  Filled 2022-10-20: qty 1

## 2022-10-20 MED ORDER — METOCLOPRAMIDE HCL 5 MG/ML IJ SOLN
10.0000 mg | Freq: Once | INTRAMUSCULAR | Status: AC
  Administered 2022-10-20: 10 mg via INTRAVENOUS
  Filled 2022-10-20: qty 2

## 2022-10-20 NOTE — Assessment & Plan Note (Signed)
Start him on p.o. labetalol.  Given 5 mg of IV Lopressor x 1.

## 2022-10-20 NOTE — ED Provider Notes (Signed)
[facility] Provider Note   CSN: [id number] Arrival date & time: 10/20/22  1214     History  Chief Complaint  Patient presents with   Abdominal Pain    John [name] is a 32 y.o. male.  32 yo M with a chief complaints of intractable nausea and vomiting.  This has been an ongoing issue for him.  He tells me he is have recurrent episodes for at least the past year.  Seems to be worse with anything that he eats or drinks.  Feels like a burning discomfort from the bottom of his neck all the way down to the upper portion of the abdomen.  No fevers.  Constipated more often than not but does have diarrhea infrequently.   Abdominal Pain      Home Medications Prior to Admission medications   Medication Sig Start Date End Date Taking? Authorizing Provider  glucose blood (ONE TOUCH ULTRA TEST) test strip Use as instructed 3x daily 05/04/17   Lucila Maine C, DO  glucose blood test strip 1 each by Other route 2 (two) times daily. Use as instructed 01/03/13   Renato Shin, MD  insulin aspart (NOVOLOG) 100 UNIT/ML injection Inject 1 Units into the skin as directed. Carb coverage and correction    [provider]  insulin glargine (LANTUS) 100 UNIT/ML injection Inject 27 Units into the skin at bedtime.    [provider]  Insulin Pen Needle 31G X 8 MM MISC 1 each by Does not apply route 4 (four) times daily -  with meals and at bedtime. 03/24/15   Charlott Rakes, MD  INSULIN SYRINGE .5CC/28G 28G X 1/2" 0.5 ML MISC Use to administer insulins as prescribed 07/10/14   Samella Parr, NP  ondansetron (ZOFRAN ODT) 4 MG disintegrating tablet Take 1 tablet (4 mg total) by mouth every 8 (eight) hours as needed for nausea or vomiting. 05/22/19   Little, Wenda Overland, MD      Allergies    Patient has no known allergies.    Review of Systems   Review of Systems  Gastrointestinal:  Positive for abdominal pain.    Physical  Exam Updated Vital Signs BP (!) 189/128   Pulse (!) 117   Temp 98.2 F (36.8 C) (Oral)   Resp 16   Ht '5\' 10"'$  (1.778 m)   Wt 81.6 kg   SpO2 92%   BMI 25.83 kg/m  Physical Exam Vitals and nursing note reviewed.  Constitutional:      Appearance: He is well-developed.  HENT:     Head: Normocephalic and atraumatic.  Eyes:     Pupils: Pupils are equal, round, and reactive to light.  Neck:     Vascular: No JVD.  Cardiovascular:     Rate and Rhythm: Regular rhythm. Tachycardia present.     Heart sounds: No murmur heard.    No friction rub. No gallop.  Pulmonary:     Effort: No respiratory distress.     Breath sounds: No wheezing.  Abdominal:     General: There is no distension.     Tenderness: There is no abdominal tenderness. There is no guarding or rebound.  Musculoskeletal:        General: Normal range of motion.     Cervical back: Normal range of motion and neck supple.  Skin:    Coloration: Skin is not pale.     Findings: No rash.  Neurological:     Mental  Status: He is alert and oriented to person, place, and time.  Psychiatric:        Behavior: Behavior normal.     ED Results / Procedures / Treatments   Labs (all labs ordered are listed, but only abnormal results are displayed) Labs Reviewed  CBC WITH DIFFERENTIAL/PLATELET - Abnormal; Notable for the following components:      Result Value   WBC 12.0 (*)    RBC 3.25 (*)    Hemoglobin 9.6 (*)    HCT 27.2 (*)    Neutro Abs 9.0 (*)    All other components within normal limits  COMPREHENSIVE METABOLIC PANEL - Abnormal; Notable for the following components:   Glucose, Bld 331 (*)    BUN 64 (*)    Creatinine, Ser 6.69 (*)    Calcium 8.8 (*)    Total Protein 5.2 (*)    Albumin 3.3 (*)    Total Bilirubin 0.2 (*)    GFR, Estimated 11 (*)    All other components within normal limits  CBG MONITORING, ED - Abnormal; Notable for the following components:   Glucose-Capillary 303 (*)    All other components within  normal limits  LIPASE, BLOOD  URINALYSIS, ROUTINE W REFLEX MICROSCOPIC    EKG EKG Interpretation  Date/Time:  Thursday October 20 2022 12:20:51 EST Ventricular Rate:  115 PR Interval:  110 QRS Duration: 81 QT Interval:  343 QTC Calculation: 475 R Axis:   85 Text Interpretation: Sinus tachycardia Ventricular premature complex Aberrant complex Borderline prolonged QT interval Baseline wander in lead(s) aVL TECHNICALLY DIFFICULT Confirmed by Deno Etienne [phone]) on 10/20/2022 12:30:23 PM  Radiology DG Chest Port 1 View  Result Date: 10/20/2022 CLINICAL DATA:  Chest pain EXAM: PORTABLE CHEST 1 VIEW COMPARISON:  05/22/2019 FINDINGS: Stable cardiomediastinal contours. Small bilateral pleural effusions are identified, left greater than right. These appear new from the previous exam. Bandlike opacities within the left lower lobe are new from the previous exam and may reflect areas of postinflammatory scarring or atelectasis. Retrocardiac opacification is also noted which may represent atelectasis or airspace disease. IMPRESSION: 1. New small bilateral pleural effusions, left greater than right. 2. New bandlike opacities within the left lower lobe may reflect areas of postinflammatory scarring or atelectasis. 3. Retrocardiac opacification may represent atelectasis or airspace disease. Electronically Signed   By: Kerby Moors M.D.   On: 10/20/2022 13:20    Procedures .Critical Care  Performed by: Deno Etienne, DO Authorized by: Deno Etienne, DO   Critical care provider statement:    Critical care time (minutes):  35   Critical care time was exclusive of:  Separately billable procedures and treating other patients   Critical care was time spent personally by me on the following activities:  Development of treatment plan with patient or surrogate, discussions with consultants, evaluation of patient's response to treatment, examination of patient, ordering and review of laboratory studies, ordering and  review of radiographic studies, ordering and performing treatments and interventions, pulse oximetry, re-evaluation of patient's condition and review of old charts   Care discussed with: admitting provider       Medications Ordered in ED Medications  lactated ringers infusion (has no administration in time range)  lactated ringers bolus 1,000 mL (0 mLs Intravenous Stopped 10/20/22 1338)  metoCLOPramide (REGLAN) injection 10 mg (10 mg Intravenous Given 10/20/22 1242)  diphenhydrAMINE (BENADRYL) injection 25 mg (25 mg Intravenous Given 10/20/22 1242)  morphine (PF) 4 MG/ML injection 4 mg (4 mg Intravenous Given 10/20/22  1257)  alum & mag hydroxide-simeth (MAALOX/MYLANTA) 200-200-20 MG/5ML suspension 30 mL (30 mLs Oral Given 10/20/22 1258)  lactated ringers bolus 1,000 mL (1,000 mLs Intravenous New Bag/Given 10/20/22 1409)    ED Course/ Medical Decision Making/ A&P                             Medical Decision Making Amount and/or Complexity of Data Reviewed Labs: ordered. Radiology: ordered.  Risk OTC drugs. Prescription drug management.   32 yo M with a chief complaint of intractable nausea and vomiting.  Been going on for about a year.  Has a history of poorly controlled diabetes.  Blood sugars mostly in the 200s.  He feels like the past couple weeks things got a bit worse.  He was like he had more discomfort from the neck down to his upper abdomen.  Will obtain a laboratory evaluation bolus of IV fluids antiemetics pain medicine oral trial reassess.  Patient found to have a leukocytosis and a new anemia the last check hemoglobin was from about 3 years ago.  Patient also with hyperglycemia without metabolic acidosis or anion gap.  He also likely has an acute kidney injury with a creatinine now of 6-1/2.  Will give more IV fluids.  Will discuss with medicine.  Chest x-ray without obvious focal infiltrate and pneumothorax on my read.  Radiology read with multiple possible findings perhaps  small bilateral pleural effusions perhaps some bandlike opacities in the lower lobes.  Patient does have some mild swelling to his lower extremity, I wonder if this is due to his renal dysfunction.  Will hold off on antibiotics at this time.  The patients results and plan were reviewed and discussed.   Any x-rays performed were independently reviewed by myself.   Differential diagnosis were considered with the presenting HPI.  Medications  lactated ringers infusion (has no administration in time range)  lactated ringers bolus 1,000 mL (0 mLs Intravenous Stopped 10/20/22 1338)  metoCLOPramide (REGLAN) injection 10 mg (10 mg Intravenous Given 10/20/22 1242)  diphenhydrAMINE (BENADRYL) injection 25 mg (25 mg Intravenous Given 10/20/22 1242)  morphine (PF) 4 MG/ML injection 4 mg (4 mg Intravenous Given 10/20/22 1257)  alum & mag hydroxide-simeth (MAALOX/MYLANTA) 200-200-20 MG/5ML suspension 30 mL (30 mLs Oral Given 10/20/22 1258)  lactated ringers bolus 1,000 mL (1,000 mLs Intravenous New Bag/Given 10/20/22 1409)    Vitals:   10/20/22 1218 10/20/22 1219 10/20/22 1300 10/20/22 1400  BP: (!) 183/120  (!) 199/128 (!) 189/128  Pulse: (!) 121  (!) 126 (!) 117  Resp: '18  17 16  '$ Temp: 98.2 F (36.8 C)     TempSrc: Oral     SpO2: 99%  98% 92%  Weight:  81.6 kg    Height:  '5\' 10"'$  (1.778 m)      Final diagnoses:  Acute renal failure, unspecified acute renal failure type (HCC)  Nausea and vomiting in adult    Admission/ observation were discussed with the admitting physician, patient and/or family and they are comfortable with the plan.          Final Clinical Impression(s) / ED Diagnoses Final diagnoses:  Acute renal failure, unspecified acute renal failure type ([place])  Nausea and vomiting in adult    Rx / DC Orders ED Discharge Orders     None         Deno Etienne, DO 10/20/22 1429

## 2022-10-20 NOTE — Assessment & Plan Note (Signed)
Patient smokes about a pack a day.  He declined nicotine patch.

## 2022-10-20 NOTE — Assessment & Plan Note (Signed)
Check A1c.  Place the patient on sliding scale.  Hold long-acting insulin for now until we see where is creatinine ends up.

## 2022-10-20 NOTE — ED Notes (Signed)
Report given to Levada Dy RN at [facility] with [facility] RN

## 2022-10-20 NOTE — ED Notes (Signed)
Client unable to void, additional 569m NS bolus initiated. ED PA aware

## 2022-10-20 NOTE — TOC Initial Note (Signed)
Transition of Care [facility]) - Initial/Assessment Note    Patient Details  Name: John [name] MRN: [id number] Date of Birth: [birth year]  Transition of [facility]) CM/SW Contact:    Verdell Carmine, RN Phone Number: 10/20/2022, 5:05 PM  Clinical Narrative:                  32 yo patient with history of IDDM I presents with a year worth of abdominal pain,. Has not been to MD since 2021, presented in acute renal failure, creatinine 6.69. BS 303, did just recently see a opthalmologic who dx patient with diabetic retinopathy, proliferative. He has had insurance issue, is currently employed.  Uninsured at present. May need MATCH, patient will be transported here to [facility], will interview patient in AM to see if we can assist with services.     TOC will follow for needs, recommendations, and transitions of care Barriers to Discharge: Continued Medical Work up, Inadequate or no insurance   Patient Goals and CMS Choice            Expected Discharge Plan and Services                                              Prior Living Arrangements/Services     Patient language and need for interpreter reviewed:: Yes        Need for Family Participation in Patient Care: Yes (Comment) Care giver support system in place?: Yes (comment)   Criminal Activity/Legal Involvement Pertinent to Current Situation/Hospitalization: No - Comment as needed  Activities of Daily Living      Permission Sought/Granted                  Emotional Assessment       Orientation: : Oriented to Self, Oriented to Place, Oriented to  Time, Oriented to Situation Alcohol / Substance Use: Not Applicable Psych Involvement: No (comment)  Admission diagnosis:  Renal insufficiency [N28.9] Patient Active Problem List   Diagnosis Date Noted   Renal insufficiency 10/20/2022   Attention deficit hyperactivity disorder (ADHD) 04/19/2016   Diabetic ketoacidosis without coma associated with type 1 diabetes  mellitus ([place])    DKA, type 1 ([place]) 07/07/2014   Tobacco abuse 07/07/2014   Migraine headache 03/26/2014   DKA (diabetic ketoacidoses) 08/28/2012   Dehydration 08/28/2012   Leukocytosis 08/28/2012   Alcohol intake above recommended sensible limits 03/29/2011   Smoker 03/29/2011   Type I (juvenile type) diabetes mellitus without mention of complication, uncontrolled 02/04/2011   Essential hypertension, benign 02/04/2011   Goiter, unspecified 02/04/2011   PCP:  Charlott Rakes, MD Pharmacy:   [id number] [place], [place] - [address] [place] [address]Phone: [phone]: [address] [place]Phone: [phone]: [address] [place] [address]Phone: [phone]: [phone]    Social Determinants of Health (SDOH) Social History: SDOH Screenings   Tobacco Use: High Risk (10/20/2022)   SDOH Interventions:     Readmission Risk Interventions     No data to display

## 2022-10-20 NOTE — ED Notes (Signed)
POX 87% ON RA, OXYGEN AT 2LPM VIA [place] INITIATED. Client instructed to take deep breaths. POX increased to 88%, [place] increased to 3lpm via [place]. Will monitor and observe

## 2022-10-20 NOTE — Progress Notes (Addendum)
Plan of Care Note for accepted transfer   Patient: John [name] MRN: [id number]   DOA: 10/20/2022  Facility requesting transfer: [facility] Requesting Provider: Tyrone Nine Reason for transfer: Renal failure - acute vs. Chronic  Facility course: Patient with h/o T1DM presenting with n/v.  Poorly controlled T1DM for years.  Has had n/v for 1 year, probably with gastroparesis.  Last BMP was in 2021, currently with creatinine 6.69, clinically looks dehydrated.  Uncertain baseline.  Needs ongoing IVF and nephrology evaluation.   Plan of care: The patient is accepted for admission to Telemetry unit, at [facility].  Addendum: called by RN to report that BP is elevated.  She is concerned that he needs a higher level of care.  Will change to progressive care bed.   Author: Karmen Bongo, MD 10/20/2022  Check [email or web address] for on-call coverage.  Nursing staff, Please call [place] number on Amion as soon as patient's arrival, so appropriate admitting provider can evaluate the pt.

## 2022-10-20 NOTE — ED Notes (Signed)
Presents with abd pain, epigastric area as well. Having a lot of nausea, bloating and vomiting post meal intake. States he is a TYPE 1 diabetic. Placed on cont cardiac monitoring

## 2022-10-20 NOTE — Assessment & Plan Note (Signed)
Admit to med telemetry bed.  Continue with IV fluids.  Check renal ultrasound.  Nephrology consult requested via secure chat Moshe Cipro).  Check urine protein creatinine ratio.

## 2022-10-20 NOTE — ED Triage Notes (Signed)
Pt reports ongoing abdominal pain x 1 year. Pain worsens after eating and causes pain to radiate into chest and back. States "it feels like I have constant heartburn" No relief with otc meds.  Also c/o blurred vision x 3 weeks but reports already seeing an ophthalmologist for this. States they told him the blurred vision was from uncontrolled diabetes.

## 2022-10-20 NOTE — ED Notes (Signed)
EDP made aware of BP, pt sleeping NAD

## 2022-10-20 NOTE — Assessment & Plan Note (Signed)
Patient admits to using Goody powders 2-3 times a day for well over a year.  Patient's renal failure could partially be explained by nonsteroidal anti-inflammatory abuse.  Continue with IV hydration.

## 2022-10-20 NOTE — Subjective & Objective (Addendum)
CC: back pain HPI: 32 year old Caucasian male history of type 1 diabetes since the age of 67, presents to the ER today with a 1-2 year history of worsening back pain, abdominal pain.  Patient has been without insurance for a while.  Has been buying his NovoLog and Lantus insulin from the pharmacy.  Unclear where he got a prescription for these meds.  He says that has been checking his sugars 4 times a day.??  Validity of this statement??  Patient also states that has been taking Goody powders 2-3 times a day for well over a year due to chronic back pain.  He states that he has been feeling worse over the last several weeks.  He is also had blurry vision.  On arrival to the ER, temp 98.2 heart rate 121 blood pressure 183/120 satting 99% on room air.  Patient's O2 saturations dropped to 87% on room air and patient was placed on 3 L of oxygen.  White count 12.0, hemoglobin 9.6, platelets of 373  Sodium 136, potassium 3.9, BUN of 64, creatinine 6.6 Serum glucose of 331  UA showed greater than 300 protein, small hemoglobin, glucose 250, specific remedy 1.020.  Chest x-ray showed small bilateral pleural effusions.  There are some atelectasis in the left lower lobe.  Patient was given 2 L of lactated Ringer's and started on 200 cc an hour of LR.  He was given 10 mg of IV hydralazine for hypertension and 4 mg of morphine for pain.  He was also given 30 cc of Maalox despite having a creatinine of 6.6.  [facility] hospitalist contacted for admission.

## 2022-10-20 NOTE — ED Notes (Addendum)
Spoke with wife and pt, pt has not been seen by PCP since 2021, unknown if this BP has been an issue for a while.  They do report insurance has been an issue and he has drastically been feeling worse in the past 2 weeks and coming here was there only option today.   Pt made aware that we are waiting for bed assignment and [facility] will be transporting him to [facility] or [facility].   Pt does c/o SHOB lung sounds are clear and diminished in bases

## 2022-10-20 NOTE — ED Notes (Signed)
Spoke with Dr. Lorin Mercy, made her aware of pt's change in status and that he needs a higher level of care.

## 2022-10-20 NOTE — H&P (Signed)
History and Physical    THO RADICE [id number] DOB: Jun 12, 1991 DOA: 10/20/2022  DOS: the patient was seen and examined on 10/20/2022  PCP: Charlott Rakes, MD   Patient coming from: Home  I have personally briefly reviewed patient's old medical records in [place]  CC: back pain HPI: 32 year old Caucasian male history of type 1 diabetes since the age of 13, presents to the ER today with a 1-2 year history of worsening back pain, abdominal pain.  Patient has been without insurance for a while.  Has been buying his NovoLog and Lantus insulin from the pharmacy.  Unclear where he got a prescription for these meds.  He says that has been checking his sugars 4 times a day.??  Validity of this statement??  Patient also states that has been taking Goody powders 2-3 times a day for well over a year due to chronic back pain.  He states that he has been feeling worse over the last several weeks.  He is also had blurry vision.  On arrival to the ER, temp 98.2 heart rate 121 blood pressure 183/120 satting 99% on room air.  Patient's O2 saturations dropped to 87% on room air and patient was placed on 3 L of oxygen.  White count 12.0, hemoglobin 9.6, platelets of 373  Sodium 136, potassium 3.9, BUN of 64, creatinine 6.6 Serum glucose of 331  UA showed greater than 300 protein, small hemoglobin, glucose 250, specific remedy 1.020.  Chest x-ray showed small bilateral pleural effusions.  There are some atelectasis in the left lower lobe.  Patient was given 2 L of lactated Ringer's and started on 200 cc an hour of LR.  He was given 10 mg of IV hydralazine for hypertension and 4 mg of morphine for pain.  He was also given 30 cc of Maalox despite having a creatinine of 6.6.  [facility] hospitalist contacted for admission.   ED Course: BUN 64, Scr 6.7  Review of Systems:  Review of Systems  Constitutional:  Positive for malaise/fatigue.  HENT: Negative.    Eyes: Negative.    Respiratory: Negative.    Cardiovascular: Negative.   Gastrointestinal:  Positive for abdominal pain.  Genitourinary: Negative.   Musculoskeletal:  Positive for back pain.  Skin: Negative.   Neurological: Negative.   Endo/Heme/Allergies: Negative.   Psychiatric/Behavioral:  Positive for substance abuse.   All other systems reviewed and are negative.   Past Medical History:  Diagnosis Date   ADD (attention deficit disorder)    Allergic rhinitis    Asthma    Diabetic ketoacidosis without coma associated with type 1 diabetes mellitus (HCC)    DKA (diabetic ketoacidoses)    Goiter, unspecified 02/04/2011   Type I (juvenile type) diabetes mellitus without mention of complication, uncontrolled 02/04/2011    Past Surgical History:  Procedure Laterality Date   TONSILLECTOMY       reports that he has been smoking cigarettes. He has been smoking an average of 1 pack per day. He quit smokeless tobacco use about 9 years ago. He reports current alcohol use of about 12.0 standard drinks of alcohol per week. He reports current drug use. Drug: Marijuana.  No Known Allergies  Family History  Problem Relation Age of Onset   Lupus Cousin    Cancer Neg Hx     Prior to Admission medications   Medication Sig Start Date End Date Taking? Authorizing Provider  glucose blood (ONE TOUCH ULTRA TEST) test strip Use as instructed 3x daily  05/04/17   Lucila Maine C, DO  glucose blood test strip 1 each by Other route 2 (two) times daily. Use as instructed 01/03/13   Renato Shin, MD  insulin aspart (NOVOLOG) 100 UNIT/ML injection Inject 1 Units into the skin as directed. Carb coverage and correction    [provider]  insulin glargine (LANTUS) 100 UNIT/ML injection Inject 27 Units into the skin at bedtime.    [provider]  Insulin Pen Needle 31G X 8 MM MISC 1 each by Does not apply route 4 (four) times daily -  with meals and at bedtime. 03/24/15   Charlott Rakes, MD  INSULIN  SYRINGE .5CC/28G 28G X 1/2" 0.5 ML MISC Use to administer insulins as prescribed 07/10/14   Samella Parr, NP  ondansetron (ZOFRAN ODT) 4 MG disintegrating tablet Take 1 tablet (4 mg total) by mouth every 8 (eight) hours as needed for nausea or vomiting. 05/22/19   Little, Wenda Overland, MD    Physical Exam: Vitals:   10/20/22 1900 10/20/22 1921 10/20/22 1945 10/20/22 2119  BP: (!) 178/122 (!) 186/127 (!) 189/128 (!) 176/122  Pulse: (!) 112 (!) 113 (!) 114 (!) 102  Resp: '15 13 13 15  '$ Temp:  98.6 F (37 C)    TempSrc:  Oral    SpO2: 98% 98% 99% 98%  Weight:      Height:        Physical Exam Vitals and nursing note reviewed.  Constitutional:      Appearance: He is normal weight. He is ill-appearing. He is not toxic-appearing.  HENT:     Head: Normocephalic and atraumatic.     Nose: Nose normal.  Eyes:     General: No scleral icterus. Cardiovascular:     Rate and Rhythm: Regular rhythm. Tachycardia present.     Pulses: Normal pulses.  Pulmonary:     Effort: Pulmonary effort is normal. No respiratory distress.  Abdominal:     General: Abdomen is flat. Bowel sounds are normal. There is no distension.     Palpations: Abdomen is soft.     Tenderness: There is no abdominal tenderness.  Musculoskeletal:     Right lower leg: Edema present.     Left lower leg: Edema present.     Right ankle: Swelling present.     Left ankle: Swelling present.     Right foot: Swelling present.     Left foot: Swelling present.     Comments: +1 pitting bilateral ankle and pedal edema  Skin:    General: Skin is warm and dry.     Capillary Refill: Capillary refill takes less than 2 seconds.  Neurological:     General: No focal deficit present.     Mental Status: He is alert and oriented to person, place, and time.      Labs on Admission: I have personally reviewed following labs and imaging studies  CBC: Recent Labs  Lab 10/20/22 1238  WBC 12.0*  NEUTROABS 9.0*  HGB 9.6*  HCT 27.2*   MCV 83.7  PLT [id number]   Basic Metabolic Panel: Recent Labs  Lab 10/20/22 1238  NA 136  K 3.9  CL 100  CO2 24  GLUCOSE 331*  BUN 64*  CREATININE 6.69*  CALCIUM 8.8*   GFR: Estimated Creatinine Clearance: 16.8 mL/min (A) (by C-G formula based on SCr of 6.69 mg/dL (H)). Liver Function Tests: Recent Labs  Lab 10/20/22 1238  AST 15  ALT 13  ALKPHOS 65  BILITOT 0.2*  PROT 5.2*  ALBUMIN 3.3*   Recent Labs  Lab 10/20/22 1238  LIPASE 31   No results for input(s): "AMMONIA" in the last 168 hours. Coagulation Profile: No results for input(s): "INR", "PROTIME" in the last 168 hours. Cardiac Enzymes: No results for input(s): "CKTOTAL", "CKMB", "CKMBINDEX", "TROPONINI", "TROPONINIHS" in the last 168 hours. BNP (last 3 results) No results for input(s): "PROBNP" in the last 8760 hours. HbA1C: No results for input(s): "HGBA1C" in the last 72 hours. CBG: Recent Labs  Lab 10/20/22 1223  GLUCAP 303*   Lipid Profile: No results for input(s): "CHOL", "HDL", "LDLCALC", "TRIG", "CHOLHDL", "LDLDIRECT" in the last 72 hours. Thyroid Function Tests: No results for input(s): "TSH", "T4TOTAL", "FREET4", "T3FREE", "THYROIDAB" in the last 72 hours. Anemia Panel: No results for input(s): "VITAMINB12", "FOLATE", "FERRITIN", "TIBC", "IRON", "RETICCTPCT" in the last 72 hours. Urine analysis:    Component Value Date/Time   COLORURINE YELLOW 10/20/2022 [place] 10/20/2022 1646   LABSPEC 1.020 10/20/2022 1646   PHURINE 6.0 10/20/2022 1646   GLUCOSEU 250 (A) 10/20/2022 1646   HGBUR SMALL (A) 10/20/2022 1646   BILIRUBINUR NEGATIVE 10/20/2022 1646   KETONESUR NEGATIVE 10/20/2022 1646   PROTEINUR >=300 (A) 10/20/2022 1646   UROBILINOGEN 0.2 03/16/2015 1040   NITRITE NEGATIVE 10/20/2022 1646   LEUKOCYTESUR NEGATIVE 10/20/2022 1646    Radiological Exams on Admission: I have personally reviewed images DG Chest Port 1 View  Result Date: 10/20/2022 CLINICAL DATA:  Chest pain  EXAM: PORTABLE CHEST 1 VIEW COMPARISON:  05/22/2019 FINDINGS: Stable cardiomediastinal contours. Small bilateral pleural effusions are identified, left greater than right. These appear new from the previous exam. Bandlike opacities within the left lower lobe are new from the previous exam and may reflect areas of postinflammatory scarring or atelectasis. Retrocardiac opacification is also noted which may represent atelectasis or airspace disease. IMPRESSION: 1. New small bilateral pleural effusions, left greater than right. 2. New bandlike opacities within the left lower lobe may reflect areas of postinflammatory scarring or atelectasis. 3. Retrocardiac opacification may represent atelectasis or airspace disease. Electronically Signed   By: Kerby Moors M.D.   On: 10/20/2022 13:20    EKG: My personal interpretation of EKG shows: sinus tachycardia    Assessment/Plan Principal Problem:   Acute renal failure (ARF) (HCC) Active Problems:   NSAID long-term use   Essential hypertension, benign   Tobacco abuse   Uncontrolled type 1 diabetes mellitus with hyperglycemia, with long-term current use of insulin (HCC)    Assessment and Plan: * Acute renal failure (ARF) ([place]) Admit to med telemetry bed.  Continue with IV fluids.  Check renal ultrasound.  Nephrology consult requested via secure chat Moshe Cipro).  Check urine protein creatinine ratio.  NSAID long-term use Patient admits to using Goody powders 2-3 times a day for well over a year.  Patient's renal failure could partially be explained by nonsteroidal anti-inflammatory abuse.  Continue with IV hydration.  Uncontrolled type 1 diabetes mellitus with hyperglycemia, with long-term current use of insulin (HCC) Check A1c.  Place the patient on sliding scale.  Hold long-acting insulin for now until we see where is creatinine ends up.  Tobacco abuse Patient smokes about a pack a day.  He declined nicotine patch.  Essential hypertension,  benign Start him on p.o. labetalol.  Given 5 mg of IV Lopressor x 1.   DVT prophylaxis: SQ Heparin Code Status: Full Code Family Communication: no family at bedside  Disposition Plan: return home  Consults called: nephrology via  secure chat([place])  Admission status: Inpatient, Telemetry bed   Kristopher Oppenheim, DO [facility] 10/20/2022, 10:31 PM

## 2022-10-21 ENCOUNTER — Telehealth (HOSPITAL_COMMUNITY): Payer: Self-pay | Admitting: Pharmacy Technician

## 2022-10-21 ENCOUNTER — Inpatient Hospital Stay (HOSPITAL_COMMUNITY): Payer: Commercial Managed Care - HMO

## 2022-10-21 ENCOUNTER — Other Ambulatory Visit (HOSPITAL_COMMUNITY): Payer: Self-pay

## 2022-10-21 DIAGNOSIS — D649 Anemia, unspecified: Secondary | ICD-10-CM | POA: Diagnosis not present

## 2022-10-21 DIAGNOSIS — R059 Cough, unspecified: Secondary | ICD-10-CM | POA: Diagnosis not present

## 2022-10-21 DIAGNOSIS — R1013 Epigastric pain: Secondary | ICD-10-CM | POA: Diagnosis not present

## 2022-10-21 DIAGNOSIS — N179 Acute kidney failure, unspecified: Secondary | ICD-10-CM | POA: Diagnosis not present

## 2022-10-21 LAB — CBC WITH DIFFERENTIAL/PLATELET
Abs Immature Granulocytes: 0.06 10*3/uL (ref 0.00–0.07)
Basophils Absolute: 0.1 10*3/uL (ref 0.0–0.1)
Basophils Relative: 1 %
Eosinophils Absolute: 0.2 10*3/uL (ref 0.0–0.5)
Eosinophils Relative: 1 %
HCT: 25.4 % — ABNORMAL LOW (ref 39.0–52.0)
Hemoglobin: 8.9 g/dL — ABNORMAL LOW (ref 13.0–17.0)
Immature Granulocytes: 0 %
Lymphocytes Relative: 11 %
Lymphs Abs: 1.8 10*3/uL (ref 0.7–4.0)
MCH: 30.3 pg (ref 26.0–34.0)
MCHC: 35 g/dL (ref 30.0–36.0)
MCV: 86.4 fL (ref 80.0–100.0)
Monocytes Absolute: 1 10*3/uL (ref 0.1–1.0)
Monocytes Relative: 6 %
Neutro Abs: 13.2 10*3/uL — ABNORMAL HIGH (ref 1.7–7.7)
Neutrophils Relative %: 81 %
Platelets: 323 10*3/uL (ref 150–400)
RBC: 2.94 MIL/uL — ABNORMAL LOW (ref 4.22–5.81)
RDW: 13.2 % (ref 11.5–15.5)
WBC: 16.4 10*3/uL — ABNORMAL HIGH (ref 4.0–10.5)
nRBC: 0 % (ref 0.0–0.2)

## 2022-10-21 LAB — COMPREHENSIVE METABOLIC PANEL
ALT: 12 U/L (ref 0–44)
AST: 11 U/L — ABNORMAL LOW (ref 15–41)
Albumin: 2.2 g/dL — ABNORMAL LOW (ref 3.5–5.0)
Alkaline Phosphatase: 47 U/L (ref 38–126)
Anion gap: 9 (ref 5–15)
BUN: 57 mg/dL — ABNORMAL HIGH (ref 6–20)
CO2: 20 mmol/L — ABNORMAL LOW (ref 22–32)
Calcium: 7.8 mg/dL — ABNORMAL LOW (ref 8.9–10.3)
Chloride: 106 mmol/L (ref 98–111)
Creatinine, Ser: 6.56 mg/dL — ABNORMAL HIGH (ref 0.61–1.24)
GFR, Estimated: 11 mL/min — ABNORMAL LOW (ref >60)
Glucose, Bld: 253 mg/dL — ABNORMAL HIGH (ref 70–99)
Potassium: 3.8 mmol/L (ref 3.5–5.1)
Sodium: 135 mmol/L (ref 135–145)
Total Bilirubin: 0.5 mg/dL (ref 0.3–1.2)
Total Protein: 4.4 g/dL — ABNORMAL LOW (ref 6.5–8.1)

## 2022-10-21 LAB — GLUCOSE, CAPILLARY
Glucose-Capillary: 270 mg/dL — ABNORMAL HIGH (ref 70–99)
Glucose-Capillary: 173 mg/dL — ABNORMAL HIGH (ref 70–99)
Glucose-Capillary: 169 mg/dL — ABNORMAL HIGH (ref 70–99)
Glucose-Capillary: 138 mg/dL — ABNORMAL HIGH (ref 70–99)

## 2022-10-21 LAB — LIPID PANEL
Cholesterol: 128 mg/dL (ref 0–200)
HDL: 30 mg/dL — ABNORMAL LOW (ref >40)
LDL Cholesterol: 75 mg/dL (ref 0–99)
Total CHOL/HDL Ratio: 4.3 RATIO
Triglycerides: 114 mg/dL (ref <150)
VLDL: 23 mg/dL (ref 0–40)

## 2022-10-21 LAB — MAGNESIUM: Magnesium: 2.1 mg/dL (ref 1.7–2.4)

## 2022-10-21 LAB — HIV ANTIBODY (ROUTINE TESTING W REFLEX): HIV Screen 4th Generation wRfx: NONREACTIVE

## 2022-10-21 LAB — TSH: TSH: 3.956 u[IU]/mL (ref 0.350–4.500)

## 2022-10-21 LAB — CK: Total CK: 57 U/L (ref 49–397)

## 2022-10-21 MED ORDER — ACETAMINOPHEN 325 MG PO TABS
650.0000 mg | ORAL_TABLET | Freq: Four times a day (QID) | ORAL | Status: DC | PRN
  Administered 2022-10-21 – 2022-10-26 (×4): 650 mg via ORAL
  Filled 2022-10-21 (×4): qty 2

## 2022-10-21 MED ORDER — ONDANSETRON HCL 4 MG PO TABS
4.0000 mg | ORAL_TABLET | Freq: Four times a day (QID) | ORAL | Status: DC | PRN
  Administered 2022-10-24 – 2022-10-29 (×7): 4 mg via ORAL
  Filled 2022-10-21 (×7): qty 1

## 2022-10-21 MED ORDER — ONDANSETRON HCL 4 MG/2ML IJ SOLN
4.0000 mg | Freq: Four times a day (QID) | INTRAMUSCULAR | Status: DC | PRN
  Administered 2022-10-21 – 2022-10-27 (×9): 4 mg via INTRAVENOUS
  Filled 2022-10-21 (×9): qty 2

## 2022-10-21 MED ORDER — SODIUM CHLORIDE 0.9 % IV SOLN: INTRAVENOUS | Status: DC

## 2022-10-21 MED ORDER — AMLODIPINE BESYLATE 5 MG PO TABS
5.0000 mg | ORAL_TABLET | Freq: Every day | ORAL | Status: DC
  Administered 2022-10-21 – 2022-10-22 (×2): 5 mg via ORAL
  Filled 2022-10-21 (×2): qty 1

## 2022-10-21 MED ORDER — INSULIN GLARGINE-YFGN 100 UNIT/ML ~~LOC~~ SOLN
10.0000 [IU] | Freq: Every day | SUBCUTANEOUS | Status: DC
  Administered 2022-10-21 – 2022-10-24 (×4): 10 [IU] via SUBCUTANEOUS
  Filled 2022-10-21 (×4): qty 0.1

## 2022-10-21 MED ORDER — INSULIN ASPART 100 UNIT/ML IJ SOLN
0.0000 [IU] | Freq: Every day | INTRAMUSCULAR | Status: DC
  Administered 2022-10-23: 2 [IU] via SUBCUTANEOUS
  Administered 2022-10-24: 4 [IU] via SUBCUTANEOUS
  Administered 2022-10-25: 2 [IU] via SUBCUTANEOUS
  Administered 2022-10-26: 4 [IU] via SUBCUTANEOUS

## 2022-10-21 MED ORDER — NICOTINE POLACRILEX 2 MG MT GUM: 2.0000 mg | CHEWING_GUM | OROMUCOSAL | Status: DC | PRN

## 2022-10-21 MED ORDER — METHOCARBAMOL 500 MG PO TABS
750.0000 mg | ORAL_TABLET | Freq: Four times a day (QID) | ORAL | Status: DC | PRN
  Administered 2022-10-21 (×2): 750 mg via ORAL
  Filled 2022-10-21 (×2): qty 2

## 2022-10-21 MED ORDER — IOHEXOL 9 MG/ML PO SOLN
500.0000 mL | ORAL | Status: AC
  Administered 2022-10-21 (×2): 500 mL via ORAL

## 2022-10-21 MED ORDER — SODIUM CHLORIDE 0.9 % IV SOLN
3.0000 g | Freq: Two times a day (BID) | INTRAVENOUS | Status: AC
  Administered 2022-10-21 – 2022-10-25 (×10): 3 g via INTRAVENOUS
  Filled 2022-10-21 (×10): qty 8

## 2022-10-21 MED ORDER — OXYCODONE HCL 5 MG PO TABS
5.0000 mg | ORAL_TABLET | Freq: Four times a day (QID) | ORAL | Status: DC | PRN
  Administered 2022-10-21 – 2022-10-29 (×20): 5 mg via ORAL
  Filled 2022-10-21 (×21): qty 1

## 2022-10-21 MED ORDER — HEPARIN SODIUM (PORCINE) 5000 UNIT/ML IJ SOLN
5000.0000 [IU] | Freq: Three times a day (TID) | INTRAMUSCULAR | Status: DC
  Administered 2022-10-21 – 2022-10-29 (×24): 5000 [IU] via SUBCUTANEOUS
  Filled 2022-10-21 (×23): qty 1

## 2022-10-21 MED ORDER — ACETAMINOPHEN 650 MG RE SUPP: 650.0000 mg | Freq: Four times a day (QID) | RECTAL | Status: DC | PRN

## 2022-10-21 MED ORDER — INSULIN ASPART 100 UNIT/ML IJ SOLN
0.0000 [IU] | Freq: Three times a day (TID) | INTRAMUSCULAR | Status: DC
  Administered 2022-10-21 (×2): 2 [IU] via SUBCUTANEOUS
  Administered 2022-10-21: 5 [IU] via SUBCUTANEOUS
  Administered 2022-10-22 (×3): 2 [IU] via SUBCUTANEOUS
  Administered 2022-10-23: 3 [IU] via SUBCUTANEOUS
  Administered 2022-10-23: 2 [IU] via SUBCUTANEOUS
  Administered 2022-10-23: 5 [IU] via SUBCUTANEOUS
  Administered 2022-10-24: 1 [IU] via SUBCUTANEOUS
  Administered 2022-10-24: 2 [IU] via SUBCUTANEOUS
  Administered 2022-10-24: 5 [IU] via SUBCUTANEOUS
  Administered 2022-10-25: 3 [IU] via SUBCUTANEOUS
  Administered 2022-10-25: 5 [IU] via SUBCUTANEOUS
  Administered 2022-10-26 (×2): 7 [IU] via SUBCUTANEOUS
  Administered 2022-10-27 (×2): 2 [IU] via SUBCUTANEOUS
  Administered 2022-10-28 (×2): 1 [IU] via SUBCUTANEOUS
  Administered 2022-10-29 (×2): 2 [IU] via SUBCUTANEOUS

## 2022-10-21 MED ORDER — ALBUMIN HUMAN 25 % IV SOLN
25.0000 g | Freq: Four times a day (QID) | INTRAVENOUS | Status: AC
  Administered 2022-10-21 – 2022-10-22 (×3): 25 g via INTRAVENOUS
  Filled 2022-10-21 (×3): qty 100

## 2022-10-21 MED ORDER — HYDRALAZINE HCL 20 MG/ML IJ SOLN
10.0000 mg | Freq: Four times a day (QID) | INTRAMUSCULAR | Status: DC | PRN
  Administered 2022-10-21 – 2022-10-22 (×3): 10 mg via INTRAVENOUS
  Filled 2022-10-21 (×3): qty 1

## 2022-10-21 NOTE — Telephone Encounter (Signed)
Patient Advocate Encounter  Prior Authorization for [employer]  has been approved.    PA# [id number] [employer] Electronic PA Form Effective dates: 10/21/2022 through 10/21/2023  Patients co-pay is $379.35 due to a $6,500.00 deductible.     Lyndel Safe, [place] Patient Advocate Specialist [place] Patient Advocate Team Direct Number: [phone]  Fax: [phone]

## 2022-10-21 NOTE — Consult Note (Addendum)
[facility] Nephrology Consultation Note  Requesting MD: Dr. Bonnielee Haff Reason for consult: AKI on CKD  HPI:  John [name] is a 32 y.o. male with history of uncontrolled type 1 diabetes, CKD presented to the ER with worsening body pain associated with nausea and vomiting seen as a consultation for worsening renal failure. The patient has type 1 diabetes and was running out of insulin intermittently.  His A1c has been more than 10.  He also reports taking Goody powder almost every day for body pain.  No other home medication bedside prescription. In the ER, he was hypertensive, tachycardic.  The labs showed elevated creatinine level of 6.69, BUN 64, CO2 24, potassium 3.9.  The last lab we have from 02/2020 which showed creatinine level of 1.31.  No lab results available to review from 2021-2024. He is currently receiving antibiotics for the management of pneumonia.  The CT scan of abdomen pelvis showed lung atelectasis/pneumonia.  No hydronephrosis. Urine output is recorded only 300 cc.  The patient is currently having nausea and few vomiting episodes.  No chest pain or shortness of breath this morning.  He does have generalized body pain and feeling weak.  PMHx:   Past Medical History:  Diagnosis Date   ADD (attention deficit disorder)    Allergic rhinitis    Asthma    Diabetic ketoacidosis without coma associated with type 1 diabetes mellitus (HCC)    DKA (diabetic ketoacidoses)    Goiter, unspecified 02/04/2011   Type I (juvenile type) diabetes mellitus without mention of complication, uncontrolled 02/04/2011    Past Surgical History:  Procedure Laterality Date   TONSILLECTOMY      Family Hx:  Family History  Problem Relation Age of Onset   Lupus Cousin    Cancer Neg Hx     Social History:  reports that he has been smoking cigarettes. He has been smoking an average of 1 pack per day. He quit smokeless tobacco use about 9 years ago. He reports current  alcohol use of about 12.0 standard drinks of alcohol per week. He reports current drug use. Drug: Marijuana.  Allergies: No Known Allergies  Medications: Prior to Admission medications   Medication Sig Start Date End Date Taking? Authorizing Provider  glucose blood (ONE TOUCH ULTRA TEST) test strip Use as instructed 3x daily 05/04/17   Lucila Maine C, DO  glucose blood test strip 1 each by Other route 2 (two) times daily. Use as instructed 01/03/13   Renato Shin, MD  insulin aspart (NOVOLOG) 100 UNIT/ML injection Inject 1 Units into the skin as directed. Carb coverage and correction    [provider]  insulin glargine (LANTUS) 100 UNIT/ML injection Inject 27 Units into the skin at bedtime.    [provider]  Insulin Pen Needle 31G X 8 MM MISC 1 each by Does not apply route 4 (four) times daily -  with meals and at bedtime. 03/24/15   Charlott Rakes, MD  INSULIN SYRINGE .5CC/28G 28G X 1/2" 0.5 ML MISC Use to administer insulins as prescribed 07/10/14   Samella Parr, NP    I have reviewed the patient's current medications.  Labs: Renal Panel: Recent Labs  Lab 10/20/22 1238 10/21/22 0753  NA 136 135  K 3.9 3.8  CL 100 106  CO2 24 20*  GLUCOSE 331* 253*  BUN 64* 57*  CREATININE 6.69* 6.56*  CALCIUM 8.8* 7.8*  MG  --  2.1     CBC:  Latest Ref Rng & Units 10/21/2022    7:53 AM 10/20/2022   12:38 PM 03/02/2020   10:12 AM  CBC  WBC 4.0 - 10.5 K/uL 16.4  12.0  14.8   Hemoglobin 13.0 - 17.0 g/dL 8.9  9.6  14.4   Hematocrit 39.0 - 52.0 % 25.4  27.2  42.8   Platelets 150 - 400 K/uL 323  373  414      Anemia Panel:  Recent Labs    10/20/22 1238 10/21/22 0753  HGB 9.6* 8.9*  MCV 83.7 86.4    Recent Labs  Lab 10/20/22 1238 10/21/22 0753  AST 15 11*  ALT 13 12  ALKPHOS 65 47  BILITOT 0.2* 0.5  PROT 5.2* 4.4*  ALBUMIN 3.3* 2.2*    Lab Results  Component Value Date   HGBA1C 10.0 04/19/2016    ROS:  Pertinent items noted in HPI and  remainder of comprehensive ROS otherwise negative.  Physical Exam: Vitals:   10/21/22 1057 10/21/22 1214  BP: (!) 175/119 (!) 163/101  Pulse: (!) 113 (!) 110  Resp: 17 16  Temp:  98 F (36.7 C)  SpO2: 97% 95%     General exam: Appears calm and comfortable  Respiratory system: Clear to auscultation. Respiratory effort normal. No wheezing or crackle Cardiovascular system: S1 & S2 heard, RRR.  No pedal edema. Gastrointestinal system: Abdomen is nondistended, soft and nontender. Normal bowel sounds heard. Central nervous system: Alert and oriented. No focal neurological deficits. Extremities: Symmetric 5 x 5 power. Skin: No rashes, lesions or ulcers Psychiatry: Judgement and insight appear normal. Mood & affect appropriate.   Assessment/Plan:  # Advanced renal failure: Presumably progressive CKD in the setting of uncontrolled diabetes and hypertension.  He may have some component of AKI due to GI loss/decreased oral intake and NSAIDs use.  The last creatinine level was around 1.3 in 2021 and no results available to review from 2021-2024.  He does have uncontrolled diabetes and hypertension in between.  UA with chronic proteinuria.  CT scan ruled out hydronephrosis. Agree with continuing IV fluid, strict ins and out, daily lab. Add IV albumin. We will watch kidney function to improve in next few days.  I have explained to the patient that he has advanced CKD and very close to requiring dialysis.  Fortunately, no urgent indication for dialysis at this time.  # Hypertension: Blood pressure is elevated.  Getting hydralazine as needed.  I will start amlodipine.  # Cough/possibly aspiration pneumonia on Unasyn.  # Anemia of CKD: Checking iron level.  May need benefit from ESA.  # CKD-MBD: Check PTH level, phosphorus level.  Thank you for the consult, we will continue to follow with you.  Teniqua Marron Tanna Furry 10/21/2022, 12:58 PM  [facility].

## 2022-10-21 NOTE — Telephone Encounter (Signed)
Patient Advocate Encounter   Received notification that prior authorization for Dexcom G7 Sensor is required.   PA submitted on 10/21/2022 [employer] Electronic PA Form Status is pending       Lyndel Safe, [place] Patient Advocate Specialist [place] Patient Advocate Team Direct Number: [phone]  Fax: [phone]

## 2022-10-21 NOTE — Progress Notes (Signed)
[facility] PROGRESS NOTE   John [name] [id number] DOB: [birth year] DOA: 10/20/2022  PCP: Charlott Rakes, MD  Brief History/Interval Summary: 32 year old Caucasian male history of type 1 diabetes since the age of 55, presented to the ER with a 1-2 year history of worsening back pain, abdominal pain.  Patient has been without insurance for a while.  Has been buying his NovoLog and Lantus insulin from the pharmacy.  In the emergency department blood work showed acute kidney injury.  He was hospitalized for further management.   Consultants: Nephrology  Procedures: None yet    Subjective/Interval History: Patient feels poorly.  He feels fatigued.  Continues to have abdominal discomfort mainly in the upper abdomen with some radiation to the back.  Some nausea but no vomiting.  He has urinated overnight.    Assessment/Plan:  Acute kidney injury The last labs we have in our system is from 2021 when he had a creatinine of 1.31.  Given with a creatinine of 6.69. He does admit to long-term NSAID use.  UA does show proteinuria. He was given IV fluid boluses in the ED but then did not receive any maintenance fluids overnight.  IV fluids have been reordered this morning.  Will increase the rate.  Monitor urine output.  Proceed with CT of the abdomen pelvis without contrast. Nephrology has been consulted.  Avoid nephrotoxic agents.  Cough with abnormal chest x-ray/leukocytosis/possible aspiration pneumonia Probably reactive.  Chest x-ray did suggest atelectasis.  He does have a cough.  COVID-19 influenza and RSV PCR's were negative. He is however afebrile.  Has been on oxygen overnight. Aspiration is a possibility.  WBC is noted to be elevated which could be reactive but difficult to be certain.  Will proceed with Unasyn for now.  Abdominal pain with nausea Could be due to gastroparesis.  Lipase level noted to be normal.  LFTs unremarkable.  Proceed with CT of the abdomen and  pelvis.  PPI.  Diabetes mellitus type 1, uncontrolled with hyperglycemia HbA1c is pending.  Monitor CBGs.  Currently on SSI.  Apparently has been taking Lantus at home.  Will start glargine here in the hospital.  Normocytic anemia No evidence of overt bleeding.  Check anemia panel.  Essential hypertension Blood pressure is poorly controlled likely due to pain issues.  Hydralazine as needed for now.  Tobacco abuse Counseled.  DVT Prophylaxis: Subcutaneous heparin Code Status: Full code Family Communication: Discussed with patient Disposition Plan: To be determined  Status is: Inpatient Remains inpatient appropriate because: Acute kidney injury      Medications: Scheduled:  heparin  5,000 Units Subcutaneous Q8H   insulin aspart  0-5 Units Subcutaneous QHS   insulin aspart  0-9 Units Subcutaneous TID WC   iohexol  500 mL Oral Q1H   pantoprazole  40 mg Oral BID   Continuous:  sodium chloride 75 mL/hr at 10/21/22 0813   [id number] **OR** acetaminophen, hydrALAZINE, methocarbamol, nicotine polacrilex, ondansetron **OR** ondansetron (ZOFRAN) IV  Antibiotics: Anti-infectives (From admission, onward)    None       Objective:  Vital Signs  Vitals:   10/20/22 2300 10/21/22 0000 10/21/22 0100 10/21/22 0745  BP:  (!) 167/118  (!) 184/114  Pulse: 99 (!) 102 (!) 106 (!) 107  Resp: '15 13 20 17  '$ Temp:    98 F (36.7 C)  TempSrc:    Oral  SpO2: 98% 97% 99% 95%  Weight:      Height:  Intake/Output Summary (Last 24 hours) at 10/21/2022 0901 Last data filed at 10/20/2022 1729 Gross per 24 hour  Intake 217.21 ml  Output 300 ml  Net -82.79 ml   Filed Weights   10/20/22 1219  Weight: 81.6 kg    General appearance: Awake alert.  In no distress Resp: Clear to auscultation bilaterally.  Normal effort Cardio: S1-S2 is normal regular.  No S3-S4.  No rubs murmurs or bruit GI: Abdomen is soft.  Tender in the epigastric area.  No tenderness in the right upper  quadrant.  No rebound rigidity or guarding.  Bowel sounds present.  No masses organomegaly. Extremities: No edema.  Full range of motion of lower extremities. Neurologic: Alert and oriented x3.  No focal neurological deficits.    Lab Results:  Data Reviewed: I have personally reviewed following labs and reports of the imaging studies  CBC: Recent Labs  Lab 10/20/22 1238 10/21/22 0753  WBC 12.0* 16.4*  NEUTROABS 9.0* 13.2*  HGB 9.6* 8.9*  HCT 27.2* 25.4*  MCV 83.7 86.4  PLT 373 [id number]    Basic Metabolic Panel: Recent Labs  Lab 10/20/22 1238 10/21/22 0753  NA 136 135  K 3.9 3.8  CL 100 106  CO2 24 20*  GLUCOSE 331* 253*  BUN 64* 57*  CREATININE 6.69* 6.56*  CALCIUM 8.8* 7.8*  MG  --  2.1    GFR: Estimated Creatinine Clearance: 17.2 mL/min (A) (by C-G formula based on SCr of 6.56 mg/dL (H)).  Liver Function Tests: Recent Labs  Lab 10/20/22 1238 10/21/22 0753  AST 15 11*  ALT 13 12  ALKPHOS 65 47  BILITOT 0.2* 0.5  PROT 5.2* 4.4*  ALBUMIN 3.3* 2.2*    Recent Labs  Lab 10/20/22 1238  LIPASE 31    CBG: Recent Labs  Lab 10/20/22 1223 10/21/22 0801  GLUCAP 303* 270*    Lipid Profile: Recent Labs    10/21/22 0753  CHOL 128  HDL 30*  LDLCALC 75  TRIG 114  CHOLHDL 4.3     Recent Results (from the past 240 hour(s))  Resp panel by RT-PCR (RSV, Flu A&B, Covid) Anterior Nasal Swab     Status: None   Collection Time: 10/20/22  3:22 PM   Specimen: Anterior Nasal Swab  Result Value Ref Range Status   SARS Coronavirus 2 by RT PCR NEGATIVE NEGATIVE Final    Comment: (NOTE) SARS-CoV-2 target nucleic acids are NOT DETECTED.  The SARS-CoV-2 RNA is generally detectable in upper respiratory specimens during the acute phase of infection. The lowest concentration of SARS-CoV-2 viral copies this assay can detect is 138 copies/mL. A negative result does not preclude SARS-Cov-2 infection and should not be used as the sole basis for treatment or other  patient management decisions. A negative result may occur with  improper specimen collection/handling, submission of specimen other than nasopharyngeal swab, presence of viral mutation(s) within the areas targeted by this assay, and inadequate number of viral copies(<138 copies/mL). A negative result must be combined with clinical observations, patient history, and epidemiological information. The expected result is Negative.  Fact Sheet for Patients:  EntrepreneurPulse.com.au  Fact Sheet for Healthcare Providers:  IncredibleEmployment.be  This test is no t yet approved or cleared by the [place] FDA and  has been authorized for detection and/or diagnosis of SARS-CoV-2 by FDA under an Emergency Use Authorization (EUA). This EUA will remain  in effect (meaning this test can be used) for the duration of the COVID-19 declaration under Section 564(b)(1)  of the Act, 21 U.S.C.section 360bbb-3(b)(1), unless the authorization is terminated  or revoked sooner.       Influenza A by PCR NEGATIVE NEGATIVE Final   Influenza B by PCR NEGATIVE NEGATIVE Final    Comment: (NOTE) The Xpert Xpress SARS-CoV-2/FLU/RSV plus assay is intended as an aid in the diagnosis of influenza from Nasopharyngeal swab specimens and should not be used as a sole basis for treatment. Nasal washings and aspirates are unacceptable for Xpert Xpress SARS-CoV-2/FLU/RSV testing.  Fact Sheet for Patients: EntrepreneurPulse.com.au  Fact Sheet for Healthcare Providers: IncredibleEmployment.be  This test is not yet approved or cleared by the [place] FDA and has been authorized for detection and/or diagnosis of SARS-CoV-2 by FDA under an Emergency Use Authorization (EUA). This EUA will remain in effect (meaning this test can be used) for the duration of the COVID-19 declaration under Section 564(b)(1) of the Act, 21 U.S.C. section  360bbb-3(b)(1), unless the authorization is terminated or revoked.     Resp Syncytial Virus by PCR NEGATIVE NEGATIVE Final    Comment: (NOTE) Fact Sheet for Patients: EntrepreneurPulse.com.au  Fact Sheet for Healthcare Providers: IncredibleEmployment.be  This test is not yet approved or cleared by the [place] FDA and has been authorized for detection and/or diagnosis of SARS-CoV-2 by FDA under an Emergency Use Authorization (EUA). This EUA will remain in effect (meaning this test can be used) for the duration of the COVID-19 declaration under Section 564(b)(1) of the Act, 21 U.S.C. section 360bbb-3(b)(1), unless the authorization is terminated or revoked.  Performed at [facility], [address], [place], [place] [address]       Radiology Studies: DG Chest [place] 1 View  Result Date: 10/20/2022 CLINICAL DATA:  Chest pain EXAM: PORTABLE CHEST 1 VIEW COMPARISON:  05/22/2019 FINDINGS: Stable cardiomediastinal contours. Small bilateral pleural effusions are identified, left greater than right. These appear new from the previous exam. Bandlike opacities within the left lower lobe are new from the previous exam and may reflect areas of postinflammatory scarring or atelectasis. Retrocardiac opacification is also noted which may represent atelectasis or airspace disease. IMPRESSION: 1. New small bilateral pleural effusions, left greater than right. 2. New bandlike opacities within the left lower lobe may reflect areas of postinflammatory scarring or atelectasis. 3. Retrocardiac opacification may represent atelectasis or airspace disease. Electronically Signed   By: Kerby Moors M.D.   On: 10/20/2022 13:20       LOS: 1 day   [facility] Pager on [email or web address]  10/21/2022, 9:01 AM

## 2022-10-21 NOTE — Inpatient Diabetes Management (Addendum)
Inpatient Diabetes Program Recommendations  AACE/ADA: New Consensus Statement on Inpatient Glycemic Control   Target Ranges:  Prepandial:   less than 140 mg/dL      Peak postprandial:   less than 180 mg/dL (1-2 hours)      Critically ill patients:  140 - 180 mg/dL    Latest Reference Range & Units 10/20/22 12:23 10/21/22 08:01  Glucose-Capillary 70 - 99 mg/dL 303 (H) 270 (H)   Review of Glycemic Control  Diabetes history: DM1 (does not make any insulin; requires basal, correction, and carb coverage insulin) Outpatient Diabetes medications: Lantus 23 units QHS, Novolog 5-10 units TID with meals Current orders for Inpatient glycemic control: Semglee 10 units daily, Novolog 0-9 units TID with meals, Novolog 0-5 units QHS  Inpatient Diabetes Program Recommendations:    Insulin: Once patient is able to eat and tolerate diet, will need meal coverage insulin.  Outpatient DM: At time of discharge, please provide Rx for glucose monitoring kit [id number]), Corinna Gab [phone]), Humalog Kwikpens [phone]), insulin pen needles [phone]). Patient has requested Rx for Dexcom G7 sensors [id number]).   NOTE:  Spoke with patient over the phone (coordinator working remotely) about diabetes and home regimen for diabetes control. Patient reports that he does not have a PCP and will need help finding one. Patient reports taking Lantus 23 units daily and Novolog 5-10 units TID with meals for DM control. Patient reports that he has been purchasing Lantus and Novolog at [facility] and paying cash price for the insulins. He has been using vial/syringe lately since it is cheaper than the pens.   Patient reports checking glucose several times a day and he notes it is up and down.  Patient states that he recently got insurance with hopes of getting his health back on track and deal with the issues he has been having. Patient states that he has been having an issues with his stomach where he feels hungry but is not able to  eat much before he feels bloated and starts having abdominal pain. Patient states that he has been making himself vomit so help relieve the pain in his stomach after eating. However, he reports that recently when he makes himself throw up it still does not help the pain.   Discussed glucose and A1C goals. Discussed importance of checking CBGs and maintaining good CBG control to prevent long-term and short-term complications. Explained how hyperglycemia leads to damage within blood vessels and damage to nerves which lead to the common complications seen with uncontrolled diabetes. Stressed to the patient the importance of improving glycemic control to prevent further complications from uncontrolled diabetes especially given he is only 32 years old. Patient reports that he would like to see if he can a Rx for new glucose monitoring kit, he would like to see if he can get a Dexcom CGM, and he will need Rx for insulins.  Informed patient that TOC would be consulted to see if they can provide a list of providers taking new patients or assist with him establishing care with a PCP.   Patient verbalized understanding of information discussed and reports no further questions at this time related to diabetes. Per [facility] outpatient pharmacy, patient's insurance covers Basaglar Kwikpens ($25 copay) and Humalog Kwikpens ($25 copay).  Thanks, Barnie Alderman, RN, MSN, CDE Diabetes Coordinator Inpatient Diabetes Program [phone] (Team Pager)

## 2022-10-21 NOTE — Progress Notes (Signed)
Pharmacy Antibiotic Note  John [name] is a 32 y.o. male admitted on 10/20/2022 with AKI on CKD.  Pharmacy has been consulted for Unasyn dosing for aspiration PNA.  SCr 6.56,  CrCl 17.2 ml/min .  Plan:  Unasyn 3 g q12hr Monitor clinical status, renal function and culture results daily.   Height: '5\' 10"'$  (177.8 cm) Weight: 81.6 kg (180 lb) IBW/kg (Calculated) : 73  Temp (24hrs), Avg:98.3 F (36.8 C), Min:98 F (36.7 C), Max:98.6 F (37 C)  Recent Labs  Lab 10/20/22 1238 10/21/22 0753  WBC 12.0* 16.4*  CREATININE 6.69* 6.56*    Estimated Creatinine Clearance: 17.2 mL/min (A) (by C-G formula based on SCr of 6.56 mg/dL (H)).    No Known Allergies  Antimicrobials this admission:  Unasyn 3/1>>  Dose adjustments this admission: N/a  Microbiology results:   Thank you for allowing pharmacy to be a part of this patient's care.  Nicole Cella, RPh Clinical Pharmacist 10/21/2022 11:13 AM

## 2022-10-22 DIAGNOSIS — D649 Anemia, unspecified: Secondary | ICD-10-CM | POA: Diagnosis not present

## 2022-10-22 DIAGNOSIS — R1013 Epigastric pain: Secondary | ICD-10-CM | POA: Diagnosis not present

## 2022-10-22 DIAGNOSIS — R059 Cough, unspecified: Secondary | ICD-10-CM | POA: Diagnosis not present

## 2022-10-22 DIAGNOSIS — N179 Acute kidney failure, unspecified: Secondary | ICD-10-CM | POA: Diagnosis not present

## 2022-10-22 LAB — FERRITIN: Ferritin: 204 ng/mL (ref 24–336)

## 2022-10-22 LAB — CBC
HCT: 24 % — ABNORMAL LOW (ref 39.0–52.0)
Hemoglobin: 7.9 g/dL — ABNORMAL LOW (ref 13.0–17.0)
MCH: 29.4 pg (ref 26.0–34.0)
MCHC: 32.9 g/dL (ref 30.0–36.0)
MCV: 89.2 fL (ref 80.0–100.0)
Platelets: 321 10*3/uL (ref 150–400)
RBC: 2.69 MIL/uL — ABNORMAL LOW (ref 4.22–5.81)
RDW: 13.2 % (ref 11.5–15.5)
WBC: 13.5 10*3/uL — ABNORMAL HIGH (ref 4.0–10.5)
nRBC: 0 % (ref 0.0–0.2)

## 2022-10-22 LAB — BASIC METABOLIC PANEL
Anion gap: 12 (ref 5–15)
BUN: 53 mg/dL — ABNORMAL HIGH (ref 6–20)
CO2: 18 mmol/L — ABNORMAL LOW (ref 22–32)
Calcium: 8.1 mg/dL — ABNORMAL LOW (ref 8.9–10.3)
Chloride: 105 mmol/L (ref 98–111)
Creatinine, Ser: 6.73 mg/dL — ABNORMAL HIGH (ref 0.61–1.24)
GFR, Estimated: 11 mL/min — ABNORMAL LOW (ref >60)
Glucose, Bld: 190 mg/dL — ABNORMAL HIGH (ref 70–99)
Potassium: 4 mmol/L (ref 3.5–5.1)
Sodium: 135 mmol/L (ref 135–145)

## 2022-10-22 LAB — HEMOGLOBIN A1C
Hgb A1c MFr Bld: 6.5 % — ABNORMAL HIGH (ref 4.8–5.6)
Mean Plasma Glucose: 140 mg/dL

## 2022-10-22 LAB — RETICULOCYTES
Immature Retic Fract: 8 % (ref 2.3–15.9)
RBC.: 2.64 MIL/uL — ABNORMAL LOW (ref 4.22–5.81)
Retic Count, Absolute: 76.8 10*3/uL (ref 19.0–186.0)
Retic Ct Pct: 2.9 % (ref 0.4–3.1)

## 2022-10-22 LAB — GLUCOSE, CAPILLARY
Glucose-Capillary: 198 mg/dL — ABNORMAL HIGH (ref 70–99)
Glucose-Capillary: 153 mg/dL — ABNORMAL HIGH (ref 70–99)
Glucose-Capillary: 154 mg/dL — ABNORMAL HIGH (ref 70–99)
Glucose-Capillary: 138 mg/dL — ABNORMAL HIGH (ref 70–99)

## 2022-10-22 LAB — IRON AND TIBC
Iron: 76 ug/dL (ref 45–182)
Saturation Ratios: 41 % — ABNORMAL HIGH (ref 17.9–39.5)
TIBC: 188 ug/dL — ABNORMAL LOW (ref 250–450)
UIBC: 112 ug/dL

## 2022-10-22 LAB — VITAMIN B12: Vitamin B-12: 659 pg/mL (ref 180–914)

## 2022-10-22 LAB — FOLATE: Folate: 8 ng/mL (ref >5.9)

## 2022-10-22 LAB — PHOSPHORUS: Phosphorus: 5 mg/dL — ABNORMAL HIGH (ref 2.5–4.6)

## 2022-10-22 LAB — ALBUMIN: Albumin: 2.9 g/dL — ABNORMAL LOW (ref 3.5–5.0)

## 2022-10-22 MED ORDER — SIMETHICONE 80 MG PO CHEW
80.0000 mg | CHEWABLE_TABLET | Freq: Once | ORAL | Status: AC | PRN
  Administered 2022-10-22: 80 mg via ORAL
  Filled 2022-10-22: qty 1

## 2022-10-22 MED ORDER — DARBEPOETIN ALFA 60 MCG/0.3ML IJ SOSY
60.0000 ug | PREFILLED_SYRINGE | INTRAMUSCULAR | Status: DC
  Administered 2022-10-22: 60 ug via SUBCUTANEOUS
  Filled 2022-10-22: qty 0.3

## 2022-10-22 MED ORDER — SODIUM CHLORIDE 0.9 % IV SOLN
250.0000 mg | Freq: Once | INTRAVENOUS | Status: AC
  Administered 2022-10-22: 250 mg via INTRAVENOUS
  Filled 2022-10-22: qty 20

## 2022-10-22 MED ORDER — AMLODIPINE BESYLATE 5 MG PO TABS
5.0000 mg | ORAL_TABLET | Freq: Once | ORAL | Status: AC
  Administered 2022-10-22: 5 mg via ORAL
  Filled 2022-10-22: qty 1

## 2022-10-22 MED ORDER — AMLODIPINE BESYLATE 10 MG PO TABS
10.0000 mg | ORAL_TABLET | Freq: Every day | ORAL | Status: DC
  Administered 2022-10-23 – 2022-10-29 (×7): 10 mg via ORAL
  Filled 2022-10-22 (×7): qty 1

## 2022-10-22 MED ORDER — LABETALOL HCL 200 MG PO TABS
100.0000 mg | ORAL_TABLET | Freq: Two times a day (BID) | ORAL | Status: DC
  Administered 2022-10-22 – 2022-10-29 (×15): 100 mg via ORAL
  Filled 2022-10-22 (×15): qty 1

## 2022-10-22 MED ORDER — SODIUM CHLORIDE 0.45 % IV SOLN
INTRAVENOUS | Status: DC
  Filled 2022-10-22 (×7): qty 75

## 2022-10-22 NOTE — Progress Notes (Signed)
[facility] PROGRESS NOTE   John [name] [id number] DOB: 08/29/1990 DOA: 10/20/2022  PCP: Charlott Rakes, MD  Brief History/Interval Summary: 32 year old Caucasian male history of type 1 diabetes since the age of 18, presented to the ER with a 1-2 year history of worsening back pain, abdominal pain.  Patient has been without insurance for a while.  Has been buying his NovoLog and Lantus insulin from the pharmacy.  In the emergency department blood work showed acute kidney injury.  He was hospitalized for further management.   Consultants: Nephrology  Procedures: None yet    Subjective/Interval History: Patient continues to have upper abdominal pain with occasional shortness of breath.  Denies any chest pain per se.  No nausea this morning.  Cough is improving.  Has been urinating.   Assessment/Plan:  Acute kidney injury The last labs we have in our system is from 2021 when he had a creatinine of 1.31.  Presented with a creatinine of 6.69. He does admit to long-term NSAID use.  UA does show proteinuria. Patient on IV fluids.  Nephrology has been consulted.  CT of the abdomen pelvis does not show any hydronephrosis. No improvement in creatinine noted.  Management per nephrology.  Cough with abnormal chest x-ray/leukocytosis/possible aspiration pneumonia Probably reactive.  Chest x-ray did suggest atelectasis.  He does have a cough.  COVID-19 influenza and RSV PCR's were negative. There was concern for aspiration pneumonia.  Patient started on Unasyn.  Cough is getting better.  Continues to have this upper abdominal and lower chest pain.  Noted to be mildly tachycardic. Etiology for symptoms could be pneumonia and his acute kidney injury.  Chest x-ray and CT of the abdomen pelvis does suggest that he has moderate bilateral pleural effusion and small pericardial effusion.  We will proceed with echocardiogram.  His TSH was noted to be normal.  WBC is better today.   Saturating in the low 90s on room air.  Incentive spirometer.  Abdominal pain with nausea Could be due to gastroparesis.  Lipase level noted to be normal.  LFTs unremarkable.  No concerning findings on CT of the abdomen and pelvis.  Continue with PPI for now.  Nausea appears to be better.  Diabetes mellitus type 1, uncontrolled with hyperglycemia HbA1c is 6.5.  Continue glargine and SSI.  Monitor CBGs.  Seen by diabetes coordinator. At discharge they recommend Basaglar KwikPen's, Humalog KwikPen's, insulin pen needles and prescription for glucose monitoring kit.  Patient interested in Dexcom G7 sensors.  Normocytic anemia Drop in hemoglobin is likely dilutional.  No overt bleeding has been noted.  Anemia panel reviewed.  No deficiencies identified.  His renal failure is likely contributing.  Essential hypertension Blood pressure noted to be elevated.  Patient was started on amlodipine by nephrology.  Continue to monitor for now.  Hydralazine as needed.    Tobacco abuse Counseled.  DVT Prophylaxis: Subcutaneous heparin Code Status: Full code Family Communication: Discussed with patient Disposition Plan: To be determined  Status is: Inpatient Remains inpatient appropriate because: Acute kidney injury      Medications: Scheduled:  amLODipine  5 mg Oral Daily   heparin  5,000 Units Subcutaneous Q8H   insulin aspart  0-5 Units Subcutaneous QHS   insulin aspart  0-9 Units Subcutaneous TID WC   insulin glargine-yfgn  10 Units Subcutaneous Daily   pantoprazole  40 mg Oral BID   Continuous:  sodium chloride 125 mL/hr at 10/22/22 0304   ampicillin-sulbactam (UNASYN) IV 3 g (10/21/22 2247)  [id number] **OR** acetaminophen, hydrALAZINE, methocarbamol, nicotine polacrilex, ondansetron **OR** ondansetron (ZOFRAN) IV, oxyCODONE  Antibiotics: Anti-infectives (From admission, onward)    Start     Dose/Rate Route Frequency Ordered Stop   10/21/22 1145  Ampicillin-Sulbactam  (UNASYN) 3 g in sodium chloride 0.9 % 100 mL IVPB        3 g 200 mL/hr over 30 Minutes Intravenous Every 12 hours 10/21/22 1058         Objective:  Vital Signs  Vitals:   10/22/22 0340 10/22/22 0400 10/22/22 0500 10/22/22 0817  BP:  (!) 164/103  (!) 185/116  Pulse: (!) 114 (!) 113    Resp: 13 11    Temp:    97.9 F (36.6 C)  TempSrc:    Oral  SpO2: 91% 90%    Weight:   86.6 kg   Height:        Intake/Output Summary (Last 24 hours) at 10/22/2022 0824 Last data filed at 10/22/2022 0304 Gross per 24 hour  Intake --  Output 1250 ml  Net -1250 ml    Filed Weights   10/20/22 1219 10/22/22 0500  Weight: 81.6 kg 86.6 kg    General appearance: Awake alert.  In no distress Resp: Diminished air entry at the bases.  Few crackles.  No wheezing or rhonchi. Cardio: S1-S2 is tachycardic regular. GI: Abdomen is soft.  Nontender nondistended.  Bowel sounds are present normal.  No masses organomegaly Extremities: Mild bilateral lower extremities.  Physical deconditioning noted. Neurologic: Alert and oriented x3.  No focal neurological deficits.     Lab Results:  Data Reviewed: I have personally reviewed following labs and reports of the imaging studies  CBC: Recent Labs  Lab 10/20/22 1238 10/21/22 0753 10/22/22 0606  WBC 12.0* 16.4* 13.5*  NEUTROABS 9.0* 13.2*  --   HGB 9.6* 8.9* 7.9*  HCT 27.2* 25.4* 24.0*  MCV 83.7 86.4 89.2  PLT 373 323 321     Basic Metabolic Panel: Recent Labs  Lab 10/20/22 1238 10/21/22 0753 10/22/22 0606  NA 136 135 135  K 3.9 3.8 4.0  CL 100 106 105  CO2 24 20* 18*  GLUCOSE 331* 253* 190*  BUN 64* 57* 53*  CREATININE 6.69* 6.56* 6.73*  CALCIUM 8.8* 7.8* 8.1*  MG  --  2.1  --   PHOS  --   --  5.0*     GFR: Estimated Creatinine Clearance: 16.7 mL/min (A) (by C-G formula based on SCr of 6.73 mg/dL (H)).  Liver Function Tests: Recent Labs  Lab 10/20/22 1238 10/21/22 0753 10/22/22 0606  AST 15 11*  --   ALT 13 12  --    ALKPHOS 65 47  --   BILITOT 0.2* 0.5  --   PROT 5.2* 4.4*  --   ALBUMIN 3.3* 2.2* 2.9*     Recent Labs  Lab 10/20/22 1238  LIPASE 31     CBG: Recent Labs  Lab 10/21/22 0801 10/21/22 1223 10/21/22 1602 10/21/22 2107 10/22/22 0816  GLUCAP 270* 173* 169* 138* 198*     Lipid Profile: Recent Labs    10/21/22 0753  CHOL 128  HDL 30*  LDLCALC 75  TRIG 114  CHOLHDL 4.3      Recent Results (from the past 240 hour(s))  Resp panel by RT-PCR (RSV, Flu A&B, Covid) Anterior Nasal Swab     Status: None   Collection Time: 10/20/22  3:22 PM   Specimen: Anterior Nasal Swab  Result Value Ref Range Status  SARS Coronavirus 2 by RT PCR NEGATIVE NEGATIVE Final    Comment: (NOTE) SARS-CoV-2 target nucleic acids are NOT DETECTED.  The SARS-CoV-2 RNA is generally detectable in upper respiratory specimens during the acute phase of infection. The lowest concentration of SARS-CoV-2 viral copies this assay can detect is 138 copies/mL. A negative result does not preclude SARS-Cov-2 infection and should not be used as the sole basis for treatment or other patient management decisions. A negative result may occur with  improper specimen collection/handling, submission of specimen other than nasopharyngeal swab, presence of viral mutation(s) within the areas targeted by this assay, and inadequate number of viral copies(<138 copies/mL). A negative result must be combined with clinical observations, patient history, and epidemiological information. The expected result is Negative.  Fact Sheet for Patients:  EntrepreneurPulse.com.au  Fact Sheet for Healthcare Providers:  IncredibleEmployment.be  This test is no t yet approved or cleared by the [place] FDA and  has been authorized for detection and/or diagnosis of SARS-CoV-2 by FDA under an Emergency Use Authorization (EUA). This EUA will remain  in effect (meaning this test can be used)  for the duration of the COVID-19 declaration under Section 564(b)(1) of the Act, 21 U.S.C.section 360bbb-3(b)(1), unless the authorization is terminated  or revoked sooner.       Influenza A by PCR NEGATIVE NEGATIVE Final   Influenza B by PCR NEGATIVE NEGATIVE Final    Comment: (NOTE) The Xpert Xpress SARS-CoV-2/FLU/RSV plus assay is intended as an aid in the diagnosis of influenza from Nasopharyngeal swab specimens and should not be used as a sole basis for treatment. Nasal washings and aspirates are unacceptable for Xpert Xpress SARS-CoV-2/FLU/RSV testing.  Fact Sheet for Patients: EntrepreneurPulse.com.au  Fact Sheet for Healthcare Providers: IncredibleEmployment.be  This test is not yet approved or cleared by the [place] FDA and has been authorized for detection and/or diagnosis of SARS-CoV-2 by FDA under an Emergency Use Authorization (EUA). This EUA will remain in effect (meaning this test can be used) for the duration of the COVID-19 declaration under Section 564(b)(1) of the Act, 21 U.S.C. section 360bbb-3(b)(1), unless the authorization is terminated or revoked.     Resp Syncytial Virus by PCR NEGATIVE NEGATIVE Final    Comment: (NOTE) Fact Sheet for Patients: EntrepreneurPulse.com.au  Fact Sheet for Healthcare Providers: IncredibleEmployment.be  This test is not yet approved or cleared by the [place] FDA and has been authorized for detection and/or diagnosis of SARS-CoV-2 by FDA under an Emergency Use Authorization (EUA). This EUA will remain in effect (meaning this test can be used) for the duration of the COVID-19 declaration under Section 564(b)(1) of the Act, 21 U.S.C. section 360bbb-3(b)(1), unless the authorization is terminated or revoked.  Performed at [facility], [address] [place], [place] [address]       Radiology Studies: CT ABDOMEN PELVIS WO  CONTRAST  Result Date: 10/21/2022 CLINICAL DATA:  Abdominal pain EXAM: CT ABDOMEN AND PELVIS WITHOUT CONTRAST TECHNIQUE: Multidetector CT imaging of the abdomen and pelvis was performed following the standard protocol without IV contrast. RADIATION DOSE REDUCTION: This exam was performed according to the departmental dose-optimization program which includes automated exposure control, adjustment of the mA and/or kV according to patient size and/or use of iterative reconstruction technique. COMPARISON:  03/16/2015 FINDINGS: Lower chest: Moderate bilateral pleural effusions are seen. Small pericardial effusion is seen. Infiltrates are seen in the posterior aspects of both lower lung fields suggesting atelectasis/pneumonia. Hepatobiliary: No focal abnormalities are seen in  liver. There is no dilation of bile ducts. Gallbladder is slightly distended. There is no wall thickening. Pancreas: No focal abnormalities are seen. Spleen: Unremarkable. Adrenals/Urinary Tract: Adrenals are unremarkable. There is no hydronephrosis. There are no renal or ureteral stones. Urinary bladder is unremarkable. Stomach/Bowel: Stomach is not distended. Small bowel loops are not dilated. Appendix is difficult to visualize. There is no pericecal inflammation. There is no significant wall thickening in colon. There is no pericolic stranding. Vascular/Lymphatic: No significant lymphadenopathy seen. Reproductive: Unremarkable. Other: Small ascites is present. There is subcutaneous edema. This may be due to anasarca. Musculoskeletal: No acute findings are seen. IMPRESSION: There is no evidence of intestinal obstruction or pneumoperitoneum. There is no hydronephrosis. Moderate bilateral pleural effusions. Small pericardial effusion. Small ascites. Linear patchy densities in both lower lung fields suggest atelectasis/pneumonia. Electronically Signed   By: Elmer Picker M.D.   On: 10/21/2022 12:56   DG Chest Port 1 View  Result Date:  10/20/2022 CLINICAL DATA:  Chest pain EXAM: PORTABLE CHEST 1 VIEW COMPARISON:  05/22/2019 FINDINGS: Stable cardiomediastinal contours. Small bilateral pleural effusions are identified, left greater than right. These appear new from the previous exam. Bandlike opacities within the left lower lobe are new from the previous exam and may reflect areas of postinflammatory scarring or atelectasis. Retrocardiac opacification is also noted which may represent atelectasis or airspace disease. IMPRESSION: 1. New small bilateral pleural effusions, left greater than right. 2. New bandlike opacities within the left lower lobe may reflect areas of postinflammatory scarring or atelectasis. 3. Retrocardiac opacification may represent atelectasis or airspace disease. Electronically Signed   By: Kerby Moors M.D.   On: 10/20/2022 13:20       LOS: 2 days   [facility] Pager on [email or web address]  10/22/2022, 8:24 AM

## 2022-10-22 NOTE — Progress Notes (Signed)
[facility] NEPHROLOGY PROGRESS NOTE  Assessment/ Plan: Pt is a 32 y.o. yo male  with history of uncontrolled type 1 diabetes, CKD presented to the ER with worsening body pain associated with nausea and vomiting seen as a consultation for worsening renal failure.   # Advanced renal failure: Presumably progressive CKD in the setting of uncontrolled diabetes and hypertension.  He may have some component of AKI due to GI loss/decreased oral intake and NSAIDs use.  The last creatinine level was around 1.3 in 2021 and no results available to review from 2021-2024.  He does have uncontrolled diabetes and hypertension in between.  UA with chronic proteinuria.  CT scan ruled out hydronephrosis. Treated with IV fluid with increased urine output to 1.2 L in 24 hours.  The creatinine level is still uptrending.  I will switch IV fluid to sodium bicarbonate today.  Also received albumin. We will watch kidney function to improve in next few days.  I have explained to the patient that he has advanced CKD and very close to requiring dialysis.  Fortunately, no urgent indication for dialysis at this time.   # Hypertension: Blood pressure is elevated.  Started amlodipine.  His blood pressure still very high with tachycardia.  I will start labetalol 100 mg twice a day.  Monitor BP.  # Cough/possibly aspiration pneumonia on Unasyn.  Getting echo by primary team.   # Anemia of CKD: Iron saturation 41%, serum iron 76.  I will order a dose of iron and Aranesp.     # CKD-MBD: Pending PTH level.monitor calcium and phosphorus level.    Discussed with the primary team.  Subjective: Seen and examined at bedside.  No nausea, vomiting, dysgeusia, chest pain today.  Some cough.  Urine output is recorded around 1.2 L.  No other major health event overnight. Objective Vital signs in last 24 hours: Vitals:   10/22/22 0400 10/22/22 0500 10/22/22 0817 10/22/22 0921  BP: (!) 164/103  (!) 185/116 (!) 180/118   Pulse: (!) 113  (!) 119 (!) 116  Resp: '11  16 19  '$ Temp:   97.9 F (36.6 C) 98.4 F (36.9 C)  TempSrc:   Oral Oral  SpO2: 90%  92% 93%  Weight:  86.6 kg    Height:       Weight change: 4.952 kg  Intake/Output Summary (Last 24 hours) at 10/22/2022 1044 Last data filed at 10/22/2022 0304 Gross per 24 hour  Intake --  Output 1250 ml  Net -1250 ml       Labs: RENAL PANEL Recent Labs  Lab 10/20/22 1238 10/21/22 0753 10/22/22 0606  NA 136 135 135  K 3.9 3.8 4.0  CL 100 106 105  CO2 24 20* 18*  GLUCOSE 331* 253* 190*  BUN 64* 57* 53*  CREATININE 6.69* 6.56* 6.73*  CALCIUM 8.8* 7.8* 8.1*  MG  --  2.1  --   PHOS  --   --  5.0*  ALBUMIN 3.3* 2.2* 2.9*    Liver Function Tests: Recent Labs  Lab 10/20/22 1238 10/21/22 0753 10/22/22 0606  AST 15 11*  --   ALT 13 12  --   ALKPHOS 65 47  --   BILITOT 0.2* 0.5  --   PROT 5.2* 4.4*  --   ALBUMIN 3.3* 2.2* 2.9*   Recent Labs  Lab 10/20/22 1238  LIPASE 31   No results for input(s): "AMMONIA" in the last 168 hours. CBC: Recent Labs    10/20/22 1238  10/21/22 0753 10/22/22 0606  HGB 9.6* 8.9* 7.9*  MCV 83.7 86.4 89.2  VITAMINB12  --   --  659  FOLATE  --   --  8.0  FERRITIN  --   --  204  TIBC  --   --  188*  IRON  --   --  76  RETICCTPCT  --   --  2.9    Cardiac Enzymes: Recent Labs  Lab 10/21/22 0752  CKTOTAL 57   CBG: Recent Labs  Lab 10/21/22 0801 10/21/22 1223 10/21/22 1602 10/21/22 2107 10/22/22 0816  GLUCAP 270* 173* 169* 138* 198*    Iron Studies:  Recent Labs    10/22/22 0606  IRON 76  TIBC 188*  FERRITIN 204   Studies/Results: CT ABDOMEN PELVIS WO CONTRAST  Result Date: 10/21/2022 CLINICAL DATA:  Abdominal pain EXAM: CT ABDOMEN AND PELVIS WITHOUT CONTRAST TECHNIQUE: Multidetector CT imaging of the abdomen and pelvis was performed following the standard protocol without IV contrast. RADIATION DOSE REDUCTION: This exam was performed according to the departmental dose-optimization  program which includes automated exposure control, adjustment of the mA and/or kV according to patient size and/or use of iterative reconstruction technique. COMPARISON:  03/16/2015 FINDINGS: Lower chest: Moderate bilateral pleural effusions are seen. Small pericardial effusion is seen. Infiltrates are seen in the posterior aspects of both lower lung fields suggesting atelectasis/pneumonia. Hepatobiliary: No focal abnormalities are seen in liver. There is no dilation of bile ducts. Gallbladder is slightly distended. There is no wall thickening. Pancreas: No focal abnormalities are seen. Spleen: Unremarkable. Adrenals/Urinary Tract: Adrenals are unremarkable. There is no hydronephrosis. There are no renal or ureteral stones. Urinary bladder is unremarkable. Stomach/Bowel: Stomach is not distended. Small bowel loops are not dilated. Appendix is difficult to visualize. There is no pericecal inflammation. There is no significant wall thickening in colon. There is no pericolic stranding. Vascular/Lymphatic: No significant lymphadenopathy seen. Reproductive: Unremarkable. Other: Small ascites is present. There is subcutaneous edema. This may be due to anasarca. Musculoskeletal: No acute findings are seen. IMPRESSION: There is no evidence of intestinal obstruction or pneumoperitoneum. There is no hydronephrosis. Moderate bilateral pleural effusions. Small pericardial effusion. Small ascites. Linear patchy densities in both lower lung fields suggest atelectasis/pneumonia. Electronically Signed   By: Elmer Picker M.D.   On: 10/21/2022 12:56   DG Chest Port 1 View  Result Date: 10/20/2022 CLINICAL DATA:  Chest pain EXAM: PORTABLE CHEST 1 VIEW COMPARISON:  05/22/2019 FINDINGS: Stable cardiomediastinal contours. Small bilateral pleural effusions are identified, left greater than right. These appear new from the previous exam. Bandlike opacities within the left lower lobe are new from the previous exam and may reflect  areas of postinflammatory scarring or atelectasis. Retrocardiac opacification is also noted which may represent atelectasis or airspace disease. IMPRESSION: 1. New small bilateral pleural effusions, left greater than right. 2. New bandlike opacities within the left lower lobe may reflect areas of postinflammatory scarring or atelectasis. 3. Retrocardiac opacification may represent atelectasis or airspace disease. Electronically Signed   By: Kerby Moors M.D.   On: 10/20/2022 13:20    Medications: Infusions:  ampicillin-sulbactam (UNASYN) IV 3 g (10/21/22 2247)    Scheduled Medications:  [START ON 10/23/2022] amLODipine  10 mg Oral Daily   heparin  5,000 Units Subcutaneous Q8H   insulin aspart  0-5 Units Subcutaneous QHS   insulin aspart  0-9 Units Subcutaneous TID WC   insulin glargine-yfgn  10 Units Subcutaneous Daily   labetalol  100 mg Oral BID  pantoprazole  40 mg Oral BID    have reviewed scheduled and prn medications.  Physical Exam: General:NAD, comfortable Heart:RRR, s1s2 nl Lungs:clear b/l, no crackle Abdomen:soft, Non-tender, non-distended Extremities:No edema Neurology: Alert, awake and following commands.  Mersadie Kavanaugh Prasad Makinzee Durley 10/22/2022,10:44 AM  LOS: 2 days

## 2022-10-23 DIAGNOSIS — N179 Acute kidney failure, unspecified: Secondary | ICD-10-CM | POA: Diagnosis not present

## 2022-10-23 DIAGNOSIS — I10 Essential (primary) hypertension: Secondary | ICD-10-CM | POA: Diagnosis not present

## 2022-10-23 DIAGNOSIS — N185 Chronic kidney disease, stage 5: Secondary | ICD-10-CM | POA: Diagnosis not present

## 2022-10-23 DIAGNOSIS — E1022 Type 1 diabetes mellitus with diabetic chronic kidney disease: Secondary | ICD-10-CM

## 2022-10-23 LAB — CBC
HCT: 23.7 % — ABNORMAL LOW (ref 39.0–52.0)
Hemoglobin: 7.8 g/dL — ABNORMAL LOW (ref 13.0–17.0)
MCH: 29.3 pg (ref 26.0–34.0)
MCHC: 32.9 g/dL (ref 30.0–36.0)
MCV: 89.1 fL (ref 80.0–100.0)
Platelets: 334 10*3/uL (ref 150–400)
RBC: 2.66 MIL/uL — ABNORMAL LOW (ref 4.22–5.81)
RDW: 13.2 % (ref 11.5–15.5)
WBC: 10.9 10*3/uL — ABNORMAL HIGH (ref 4.0–10.5)
nRBC: 0 % (ref 0.0–0.2)

## 2022-10-23 LAB — BASIC METABOLIC PANEL
Anion gap: 9 (ref 5–15)
BUN: 53 mg/dL — ABNORMAL HIGH (ref 6–20)
CO2: 21 mmol/L — ABNORMAL LOW (ref 22–32)
Calcium: 8 mg/dL — ABNORMAL LOW (ref 8.9–10.3)
Chloride: 106 mmol/L (ref 98–111)
Creatinine, Ser: 7.01 mg/dL — ABNORMAL HIGH (ref 0.61–1.24)
GFR, Estimated: 10 mL/min — ABNORMAL LOW (ref >60)
Glucose, Bld: 164 mg/dL — ABNORMAL HIGH (ref 70–99)
Potassium: 3.9 mmol/L (ref 3.5–5.1)
Sodium: 136 mmol/L (ref 135–145)

## 2022-10-23 LAB — GLUCOSE, CAPILLARY
Glucose-Capillary: 233 mg/dL — ABNORMAL HIGH (ref 70–99)
Glucose-Capillary: 277 mg/dL — ABNORMAL HIGH (ref 70–99)
Glucose-Capillary: 196 mg/dL — ABNORMAL HIGH (ref 70–99)
Glucose-Capillary: 238 mg/dL — ABNORMAL HIGH (ref 70–99)

## 2022-10-23 NOTE — Progress Notes (Signed)
[facility] NEPHROLOGY PROGRESS NOTE  Assessment/ Plan: Pt is a 32 y.o. yo male  with history of uncontrolled type 1 diabetes, CKD presented to the ER with worsening body pain associated with nausea and vomiting seen as a consultation for worsening renal failure.   # Advanced renal failure: Presumably progressive CKD in the setting of uncontrolled diabetes and hypertension.  He may have some component of AKI due to GI loss/decreased oral intake and NSAIDs use.  The last creatinine level was around 1.3 in 2021 and no results available to review from 2021-2024.  He does have uncontrolled diabetes and hypertension in between.  UA with chronic proteinuria.  CT scan ruled out hydronephrosis. Treating with IV fluid with mildly increased urine output however continue to worsen renal function.  I have discussed with the patient and his girlfriend today that if no improvement in kidney function by tomorrow then he will likely need dialysis.  Reviewed access placement, dialysis and referral to the transplant center from outpatient  in detail.  Fortunately he does not have signs or symptoms of uremia.  The plan was made to continue IV fluid today and watch kidney function in the morning before deciding HD.  I will keep him n.p.o. postmidnight for possible IR procedure in the morning.   # Hypertension: Amlodipine increased to 10 mg and added labetalol.  Monitor blood pressure and heart rate.  Getting echo.    # Cough/possibly aspiration pneumonia on Unasyn.  Getting echo by primary team.   # Anemia of CKD: Iron saturation 41%, serum iron 76.  Treated with a dose of IV iron and Aranesp on 3/2.      # CKD-MBD: Pending PTH level.monitor calcium and phosphorus level.    Discussed with the primary team.  Subjective: Seen and examined at bedside.  The urine output is recorded around 900 cc.  The patient denies nausea, vomiting, chest pain, shortness of breath.  His girlfriend was presented at the  bedside who has multiple questions about dialysis, kidney function etc.  Objective Vital signs in last 24 hours: Vitals:   10/23/22 0336 10/23/22 0500 10/23/22 0844 10/23/22 0931  BP: 134/83   (!) 173/114  Pulse: (!) 105     Resp: 16     Temp: 97.7 F (36.5 C)  98.2 F (36.8 C)   TempSrc: Oral  Oral   SpO2: 90%  92%   Weight:  87 kg    Height:       Weight change: 0.4 kg  Intake/Output Summary (Last 24 hours) at 10/23/2022 0953 Last data filed at 10/22/2022 1648 Gross per 24 hour  Intake 600 ml  Output 900 ml  Net -300 ml        Labs: RENAL PANEL Recent Labs  Lab 10/20/22 1238 10/21/22 0753 10/22/22 0606 10/23/22 0619  NA 136 135 135 136  K 3.9 3.8 4.0 3.9  CL 100 106 105 106  CO2 24 20* 18* 21*  GLUCOSE 331* 253* 190* 164*  BUN 64* 57* 53* 53*  CREATININE 6.69* 6.56* 6.73* 7.01*  CALCIUM 8.8* 7.8* 8.1* 8.0*  MG  --  2.1  --   --   PHOS  --   --  5.0*  --   ALBUMIN 3.3* 2.2* 2.9*  --      Liver Function Tests: Recent Labs  Lab 10/20/22 1238 10/21/22 0753 10/22/22 0606  AST 15 11*  --   ALT 13 12  --   ALKPHOS 65 47  --  BILITOT 0.2* 0.5  --   PROT 5.2* 4.4*  --   ALBUMIN 3.3* 2.2* 2.9*    Recent Labs  Lab 10/20/22 1238  LIPASE 31    No results for input(s): "AMMONIA" in the last 168 hours. CBC: Recent Labs    10/20/22 1238 10/21/22 0753 10/22/22 0606 10/23/22 0619  HGB 9.6* 8.9* 7.9* 7.8*  MCV 83.7 86.4 89.2 89.1  VITAMINB12  --   --  659  --   FOLATE  --   --  8.0  --   FERRITIN  --   --  204  --   TIBC  --   --  188*  --   IRON  --   --  76  --   RETICCTPCT  --   --  2.9  --      Cardiac Enzymes: Recent Labs  Lab 10/21/22 0752  CKTOTAL 57    CBG: Recent Labs  Lab 10/22/22 0816 10/22/22 1247 10/22/22 1609 10/22/22 2111 10/23/22 0844  GLUCAP 198* 153* 154* 138* 233*     Iron Studies:  Recent Labs    10/22/22 0606  IRON 76  TIBC 188*  FERRITIN 204    Studies/Results: CT ABDOMEN PELVIS WO  CONTRAST  Result Date: 10/21/2022 CLINICAL DATA:  Abdominal pain EXAM: CT ABDOMEN AND PELVIS WITHOUT CONTRAST TECHNIQUE: Multidetector CT imaging of the abdomen and pelvis was performed following the standard protocol without IV contrast. RADIATION DOSE REDUCTION: This exam was performed according to the departmental dose-optimization program which includes automated exposure control, adjustment of the mA and/or kV according to patient size and/or use of iterative reconstruction technique. COMPARISON:  03/16/2015 FINDINGS: Lower chest: Moderate bilateral pleural effusions are seen. Small pericardial effusion is seen. Infiltrates are seen in the posterior aspects of both lower lung fields suggesting atelectasis/pneumonia. Hepatobiliary: No focal abnormalities are seen in liver. There is no dilation of bile ducts. Gallbladder is slightly distended. There is no wall thickening. Pancreas: No focal abnormalities are seen. Spleen: Unremarkable. Adrenals/Urinary Tract: Adrenals are unremarkable. There is no hydronephrosis. There are no renal or ureteral stones. Urinary bladder is unremarkable. Stomach/Bowel: Stomach is not distended. Small bowel loops are not dilated. Appendix is difficult to visualize. There is no pericecal inflammation. There is no significant wall thickening in colon. There is no pericolic stranding. Vascular/Lymphatic: No significant lymphadenopathy seen. Reproductive: Unremarkable. Other: Small ascites is present. There is subcutaneous edema. This may be due to anasarca. Musculoskeletal: No acute findings are seen. IMPRESSION: There is no evidence of intestinal obstruction or pneumoperitoneum. There is no hydronephrosis. Moderate bilateral pleural effusions. Small pericardial effusion. Small ascites. Linear patchy densities in both lower lung fields suggest atelectasis/pneumonia. Electronically Signed   By: Elmer Picker M.D.   On: 10/21/2022 12:56    Medications: Infusions:   ampicillin-sulbactam (UNASYN) IV 3 g (10/22/22 2253)   sodium bicarbonate 75 mEq in sodium chloride 0.45 % 1,075 mL infusion 75 mL/hr at 10/22/22 2251    Scheduled Medications:  amLODipine  10 mg Oral Daily   darbepoetin (ARANESP) injection - NON-DIALYSIS  60 mcg Subcutaneous Q Sat-1800   heparin  5,000 Units Subcutaneous Q8H   insulin aspart  0-5 Units Subcutaneous QHS   insulin aspart  0-9 Units Subcutaneous TID WC   insulin glargine-yfgn  10 Units Subcutaneous Daily   labetalol  100 mg Oral BID   pantoprazole  40 mg Oral BID    have reviewed scheduled and prn medications.  Physical Exam: General:NAD, comfortable Heart:RRR, s1s2  nl Lungs: Clear b/l, no crackle Abdomen:soft, Non-tender, non-distended Extremities:No edema Neurology: Alert, awake and following commands.  John [name] 10/23/2022,9:53 AM  LOS: 3 days

## 2022-10-23 NOTE — Progress Notes (Signed)
[facility] PROGRESS NOTE   John [name] [id number] DOB: July 05, 1991 DOA: 10/20/2022  PCP: Charlott Rakes, MD  Brief History/Interval Summary: 32 year old Caucasian male history of type 1 diabetes since the age of 16, presented to the ER with a 1-2 year history of worsening back pain, abdominal pain.  Patient has been without insurance for a while.  Has been buying his NovoLog and Lantus insulin from the pharmacy.  In the emergency department blood work showed acute kidney injury.  He was hospitalized for further management.   Consultants: Nephrology  Procedures: None yet    Subjective/Interval History: Patient mentions that his upper abdominal discomfort appears to have eased up.  No nausea.  No significant cough.  Continues to have body aches.  Has had visual impairment since November.   Assessment/Plan:  Acute kidney injury The last labs we have in our system is from 2021 when he had a creatinine of 1.31.  Presented with a creatinine of 6.69. He does admit to long-term NSAID use.  UA does show proteinuria. CT of the abdomen pelvis does not show any hydronephrosis. Nephrology was consulted.  Patient is on a bicarbonate infusion. Creatinine continues to worsen.  Management per nephrology.  Cough with abnormal chest x-ray/leukocytosis/possible aspiration pneumonia Chest x-ray suggested atelectasis versus opacities.  Patient did have a cough. COVID-19 influenza and RSV PCR's were negative. There was concern for aspiration pneumonia.  Patient started on Unasyn.   Cough seems to be improving.  WBC has improved.  Continue with incentive spirometry.  Sinus tachycardia Likely due to acute illness.  Chest x-ray and CT of the abdomen pelvis does suggest that he has moderate bilateral pleural effusion and small pericardial effusion.  These are most likely due to renal failure and hypoalbuminemia.  He was given albumin infusions. TSH was normal.  Patient started on labetalol  for high blood pressure.  Heart rate seems to be slightly better. Echocardiogram is pending.  Abdominal pain with nausea Could be due to gastroparesis.  Lipase level noted to be normal.  LFTs unremarkable.  No concerning findings on CT of the abdomen and pelvis.  Continue with PPI for now.  Nausea appears to be better.  Diabetes mellitus type 1, uncontrolled with hyperglycemia HbA1c is 6.5.  Continue glargine and SSI.  Monitor CBGs.  Seen by diabetes coordinator. At discharge they recommend Basaglar KwikPen's, Humalog KwikPen's, insulin pen needles and prescription for glucose monitoring kit.  Patient interested in Dexcom G7 sensors.  Normocytic anemia Drop in hemoglobin is likely dilutional.  No overt bleeding has been noted.  Anemia panel reviewed.  No deficiencies identified.  His renal failure is likely contributing.  Essential hypertension Blood pressures have been poorly controlled.  Patient was started on amlodipine.  Dose was increased yesterday.  He was also started on labetalol.  Continue to monitor.  Hydralazine as needed.    Visual impairment Started in November.  Apparently has been seen by retina specialist.  He is supposed to follow-up for further interventions.  All of this is likely driven by his diabetes and hypertension.  Tobacco abuse Counseled.  DVT Prophylaxis: Subcutaneous heparin Code Status: Full code Family Communication: Discussed with patient Disposition Plan: To be determined  Status is: Inpatient Remains inpatient appropriate because: Acute kidney injury      Medications: Scheduled:  amLODipine  10 mg Oral Daily   darbepoetin (ARANESP) injection - NON-DIALYSIS  60 mcg Subcutaneous Q Sat-1800   heparin  5,000 Units Subcutaneous Q8H   insulin aspart  0-5 Units Subcutaneous QHS   insulin aspart  0-9 Units Subcutaneous TID WC   insulin glargine-yfgn  10 Units Subcutaneous Daily   labetalol  100 mg Oral BID   pantoprazole  40 mg Oral BID    Continuous:  ampicillin-sulbactam (UNASYN) IV 3 g (10/22/22 2253)   sodium bicarbonate 75 mEq in sodium chloride 0.45 % 1,075 mL infusion 75 mL/hr at 10/22/22 2251   [id number] **OR** acetaminophen, hydrALAZINE, methocarbamol, nicotine polacrilex, ondansetron **OR** ondansetron (ZOFRAN) IV, oxyCODONE  Antibiotics: Anti-infectives (From admission, onward)    Start     Dose/Rate Route Frequency Ordered Stop   10/21/22 1145  Ampicillin-Sulbactam (UNASYN) 3 g in sodium chloride 0.9 % 100 mL IVPB        3 g 200 mL/hr over 30 Minutes Intravenous Every 12 hours 10/21/22 1058 10/26/22 1144       Objective:  Vital Signs  Vitals:   10/22/22 2005 10/22/22 2346 10/23/22 0336 10/23/22 0500  BP:  (!) 136/98 134/83   Pulse: (!) 107 (!) 103 (!) 105   Resp: '16 17 16   '$ Temp:  97.6 F (36.4 C) 97.7 F (36.5 C)   TempSrc:  Oral Oral   SpO2: 93% 91% 90%   Weight:    87 kg  Height:        Intake/Output Summary (Last 24 hours) at 10/23/2022 0839 Last data filed at 10/22/2022 1648 Gross per 24 hour  Intake 600 ml  Output 900 ml  Net -300 ml    Filed Weights   10/20/22 1219 10/22/22 0500 10/23/22 0500  Weight: 81.6 kg 86.6 kg 87 kg    General appearance: Awake alert.  In no distress Resp: Improved air entry bilaterally.  Few crackles at the bases.  No wheezing or rhonchi. Cardio: S1-S2 is tachycardic regular.  No S3-S4.  No rubs murmurs or bruit GI: Abdomen is soft.  Nontender nondistended.  Bowel sounds are present normal.  No masses organomegaly Extremities: Mild edema bilateral lower extremities. Neurologic: Alert and oriented x3.  No focal neurological deficits.     Lab Results:  Data Reviewed: I have personally reviewed following labs and reports of the imaging studies  CBC: Recent Labs  Lab 10/20/22 1238 10/21/22 0753 10/22/22 0606 10/23/22 0619  WBC 12.0* 16.4* 13.5* 10.9*  NEUTROABS 9.0* 13.2*  --   --   HGB 9.6* 8.9* 7.9* 7.8*  HCT 27.2* 25.4* 24.0* 23.7*   MCV 83.7 86.4 89.2 89.1  PLT 373 323 321 334     Basic Metabolic Panel: Recent Labs  Lab 10/20/22 1238 10/21/22 0753 10/22/22 0606 10/23/22 0619  NA 136 135 135 136  K 3.9 3.8 4.0 3.9  CL 100 106 105 106  CO2 24 20* 18* 21*  GLUCOSE 331* 253* 190* 164*  BUN 64* 57* 53* 53*  CREATININE 6.69* 6.56* 6.73* 7.01*  CALCIUM 8.8* 7.8* 8.1* 8.0*  MG  --  2.1  --   --   PHOS  --   --  5.0*  --      GFR: Estimated Creatinine Clearance: 16.1 mL/min (A) (by C-G formula based on SCr of 7.01 mg/dL (H)).  Liver Function Tests: Recent Labs  Lab 10/20/22 1238 10/21/22 0753 10/22/22 0606  AST 15 11*  --   ALT 13 12  --   ALKPHOS 65 47  --   BILITOT 0.2* 0.5  --   PROT 5.2* 4.4*  --   ALBUMIN 3.3* 2.2* 2.9*     Recent Labs  Lab 10/20/22 1238  LIPASE 31     CBG: Recent Labs  Lab 10/21/22 2107 10/22/22 0816 10/22/22 1247 10/22/22 1609 10/22/22 2111  GLUCAP 138* 198* 153* 154* 138*     Lipid Profile: Recent Labs    10/21/22 0753  CHOL 128  HDL 30*  LDLCALC 75  TRIG 114  CHOLHDL 4.3      Recent Results (from the past 240 hour(s))  Resp panel by RT-PCR (RSV, Flu A&B, Covid) Anterior Nasal Swab     Status: None   Collection Time: 10/20/22  3:22 PM   Specimen: Anterior Nasal Swab  Result Value Ref Range Status   SARS Coronavirus 2 by RT PCR NEGATIVE NEGATIVE Final    Comment: (NOTE) SARS-CoV-2 target nucleic acids are NOT DETECTED.  The SARS-CoV-2 RNA is generally detectable in upper respiratory specimens during the acute phase of infection. The lowest concentration of SARS-CoV-2 viral copies this assay can detect is 138 copies/mL. A negative result does not preclude SARS-Cov-2 infection and should not be used as the sole basis for treatment or other patient management decisions. A negative result may occur with  improper specimen collection/handling, submission of specimen other than nasopharyngeal swab, presence of viral mutation(s) within  the areas targeted by this assay, and inadequate number of viral copies(<138 copies/mL). A negative result must be combined with clinical observations, patient history, and epidemiological information. The expected result is Negative.  Fact Sheet for Patients:  EntrepreneurPulse.com.au  Fact Sheet for Healthcare Providers:  IncredibleEmployment.be  This test is no t yet approved or cleared by the [place] FDA and  has been authorized for detection and/or diagnosis of SARS-CoV-2 by FDA under an Emergency Use Authorization (EUA). This EUA will remain  in effect (meaning this test can be used) for the duration of the COVID-19 declaration under Section 564(b)(1) of the Act, 21 U.S.C.section 360bbb-3(b)(1), unless the authorization is terminated  or revoked sooner.       Influenza A by PCR NEGATIVE NEGATIVE Final   Influenza B by PCR NEGATIVE NEGATIVE Final    Comment: (NOTE) The Xpert Xpress SARS-CoV-2/FLU/RSV plus assay is intended as an aid in the diagnosis of influenza from Nasopharyngeal swab specimens and should not be used as a sole basis for treatment. Nasal washings and aspirates are unacceptable for Xpert Xpress SARS-CoV-2/FLU/RSV testing.  Fact Sheet for Patients: EntrepreneurPulse.com.au  Fact Sheet for Healthcare Providers: IncredibleEmployment.be  This test is not yet approved or cleared by the [place] FDA and has been authorized for detection and/or diagnosis of SARS-CoV-2 by FDA under an Emergency Use Authorization (EUA). This EUA will remain in effect (meaning this test can be used) for the duration of the COVID-19 declaration under Section 564(b)(1) of the Act, 21 U.S.C. section 360bbb-3(b)(1), unless the authorization is terminated or revoked.     Resp Syncytial Virus by PCR NEGATIVE NEGATIVE Final    Comment: (NOTE) Fact Sheet for  Patients: EntrepreneurPulse.com.au  Fact Sheet for Healthcare Providers: IncredibleEmployment.be  This test is not yet approved or cleared by the [place] FDA and has been authorized for detection and/or diagnosis of SARS-CoV-2 by FDA under an Emergency Use Authorization (EUA). This EUA will remain in effect (meaning this test can be used) for the duration of the COVID-19 declaration under Section 564(b)(1) of the Act, 21 U.S.C. section 360bbb-3(b)(1), unless the authorization is terminated or revoked.  Performed at [facility], [address], [place], [place] [address]       Radiology  Studies: CT ABDOMEN PELVIS WO CONTRAST  Result Date: 10/21/2022 CLINICAL DATA:  Abdominal pain EXAM: CT ABDOMEN AND PELVIS WITHOUT CONTRAST TECHNIQUE: Multidetector CT imaging of the abdomen and pelvis was performed following the standard protocol without IV contrast. RADIATION DOSE REDUCTION: This exam was performed according to the departmental dose-optimization program which includes automated exposure control, adjustment of the mA and/or kV according to patient size and/or use of iterative reconstruction technique. COMPARISON:  03/16/2015 FINDINGS: Lower chest: Moderate bilateral pleural effusions are seen. Small pericardial effusion is seen. Infiltrates are seen in the posterior aspects of both lower lung fields suggesting atelectasis/pneumonia. Hepatobiliary: No focal abnormalities are seen in liver. There is no dilation of bile ducts. Gallbladder is slightly distended. There is no wall thickening. Pancreas: No focal abnormalities are seen. Spleen: Unremarkable. Adrenals/Urinary Tract: Adrenals are unremarkable. There is no hydronephrosis. There are no renal or ureteral stones. Urinary bladder is unremarkable. Stomach/Bowel: Stomach is not distended. Small bowel loops are not dilated. Appendix is difficult to visualize. There is no pericecal inflammation.  There is no significant wall thickening in colon. There is no pericolic stranding. Vascular/Lymphatic: No significant lymphadenopathy seen. Reproductive: Unremarkable. Other: Small ascites is present. There is subcutaneous edema. This may be due to anasarca. Musculoskeletal: No acute findings are seen. IMPRESSION: There is no evidence of intestinal obstruction or pneumoperitoneum. There is no hydronephrosis. Moderate bilateral pleural effusions. Small pericardial effusion. Small ascites. Linear patchy densities in both lower lung fields suggest atelectasis/pneumonia. Electronically Signed   By: Elmer Picker M.D.   On: 10/21/2022 12:56       LOS: 3 days   [facility] Pager on [email or web address]  10/23/2022, 8:39 AM

## 2022-10-24 ENCOUNTER — Inpatient Hospital Stay (HOSPITAL_COMMUNITY): Payer: Commercial Managed Care - HMO

## 2022-10-24 DIAGNOSIS — R0609 Other forms of dyspnea: Secondary | ICD-10-CM | POA: Diagnosis not present

## 2022-10-24 DIAGNOSIS — E1022 Type 1 diabetes mellitus with diabetic chronic kidney disease: Secondary | ICD-10-CM | POA: Diagnosis not present

## 2022-10-24 DIAGNOSIS — N185 Chronic kidney disease, stage 5: Secondary | ICD-10-CM | POA: Diagnosis not present

## 2022-10-24 DIAGNOSIS — Z01810 Encounter for preprocedural cardiovascular examination: Secondary | ICD-10-CM

## 2022-10-24 DIAGNOSIS — E1122 Type 2 diabetes mellitus with diabetic chronic kidney disease: Secondary | ICD-10-CM | POA: Diagnosis not present

## 2022-10-24 DIAGNOSIS — N179 Acute kidney failure, unspecified: Secondary | ICD-10-CM | POA: Diagnosis not present

## 2022-10-24 DIAGNOSIS — I10 Essential (primary) hypertension: Secondary | ICD-10-CM | POA: Diagnosis not present

## 2022-10-24 LAB — ECHOCARDIOGRAM COMPLETE
AR max vel: 2.5 cm2
AV Area VTI: 2.56 cm2
AV Area mean vel: 2.49 cm2
AV Mean grad: 4 mmHg
AV Peak grad: 6.5 mmHg
Ao pk vel: 1.27 m/s
Area-P 1/2: 3.85 cm2
Calc EF: 47.1 %
Est EF: 55
Height: 70 in
MV M vel: 2.6 m/s
MV Peak grad: 27 mmHg
S' Lateral: 3.5 cm
Single Plane A2C EF: 40.7 %
Single Plane A4C EF: 53.1 %
Weight: 3068.8 oz

## 2022-10-24 LAB — BASIC METABOLIC PANEL
Anion gap: 11 (ref 5–15)
BUN: 52 mg/dL — ABNORMAL HIGH (ref 6–20)
CO2: 21 mmol/L — ABNORMAL LOW (ref 22–32)
Calcium: 7.8 mg/dL — ABNORMAL LOW (ref 8.9–10.3)
Chloride: 102 mmol/L (ref 98–111)
Creatinine, Ser: 7.29 mg/dL — ABNORMAL HIGH (ref 0.61–1.24)
GFR, Estimated: 10 mL/min — ABNORMAL LOW (ref >60)
Glucose, Bld: 175 mg/dL — ABNORMAL HIGH (ref 70–99)
Potassium: 3.7 mmol/L (ref 3.5–5.1)
Sodium: 134 mmol/L — ABNORMAL LOW (ref 135–145)

## 2022-10-24 LAB — PROTEIN / CREATININE RATIO, URINE
Creatinine, Urine: 101 mg/dL
Protein Creatinine Ratio: 9.83 mg/mg{Cre} — ABNORMAL HIGH (ref 0.00–0.15)
Total Protein, Urine: 993 mg/dL

## 2022-10-24 LAB — CBC
HCT: 23 % — ABNORMAL LOW (ref 39.0–52.0)
Hemoglobin: 8 g/dL — ABNORMAL LOW (ref 13.0–17.0)
MCH: 30 pg (ref 26.0–34.0)
MCHC: 34.8 g/dL (ref 30.0–36.0)
MCV: 86.1 fL (ref 80.0–100.0)
Platelets: 303 10*3/uL (ref 150–400)
RBC: 2.67 MIL/uL — ABNORMAL LOW (ref 4.22–5.81)
RDW: 13.1 % (ref 11.5–15.5)
WBC: 10 10*3/uL (ref 4.0–10.5)
nRBC: 0 % (ref 0.0–0.2)

## 2022-10-24 LAB — GLUCOSE, CAPILLARY
Glucose-Capillary: 267 mg/dL — ABNORMAL HIGH (ref 70–99)
Glucose-Capillary: 183 mg/dL — ABNORMAL HIGH (ref 70–99)
Glucose-Capillary: 148 mg/dL — ABNORMAL HIGH (ref 70–99)
Glucose-Capillary: 312 mg/dL — ABNORMAL HIGH (ref 70–99)

## 2022-10-24 MED ORDER — INSULIN GLARGINE-YFGN 100 UNIT/ML ~~LOC~~ SOLN
12.0000 [IU] | Freq: Every day | SUBCUTANEOUS | Status: DC
  Administered 2022-10-25: 12 [IU] via SUBCUTANEOUS
  Filled 2022-10-24 (×2): qty 0.12

## 2022-10-24 NOTE — Progress Notes (Addendum)
[facility] PROGRESS NOTE  Assessment/ Plan: Pt is a 32 y.o. yo male  with history of uncontrolled type 1 diabetes, CKD presented to the ER with worsening body pain associated with nausea and vomiting seen as a consultation for worsening renal failure.   # Advanced renal failure: Presumably progressive CKD in the setting of uncontrolled diabetes and hypertension.  He may have some component of AKI due to GI loss/decreased oral intake and NSAIDs use.  The last creatinine level was around 1.3 in 2021 and no results available to review from 2021-2024.  He does have uncontrolled diabetes and hypertension in between.  UA with chronic proteinuria.  CT scan ruled out hydronephrosis. Treating with IV fluid with mildly increased urine output however continue to worsen renal function.   - will need to start dialysis this admission  - hoping to do [facility] and [facility] at the same time- d/w pt and he is in agreement  - will consult [facility]   # Hypertension: Amlodipine increased to 10 mg and added labetalol.  Monitor blood pressure and heart rate.  Getting echo.    # Cough/possibly aspiration pneumonia on Unasyn.  Getting echo by primary team.   # Anemia of CKD: Iron saturation 41%, serum iron 76.  Treated with a dose of IV iron and Aranesp on 3/2.      # CKD-MBD: Pending PTH level.monitor calcium and phosphorus level.    Discussed with the primary team.  Subjective: Seen and examined at bedside.  Sleepy, arousable.  No n/v, bad taste in mouth.  Cr not improving.    Objective Vital signs in last 24 hours: Vitals:   10/24/22 0307 10/24/22 0400 10/24/22 0500 10/24/22 0800  BP: (!) 143/113 137/88  (!) 159/107  Pulse: 100 (!) 101  100  Resp: '17 16  17  '$ Temp: 98 F (36.7 C)     TempSrc: Oral     SpO2: 93% 90%  90%  Weight:   87 kg   Height:       Weight change: 0 kg  Intake/Output Summary (Last 24 hours) at 10/24/2022 0917 Last data filed at 10/23/2022 2249 Gross per 24 hour  Intake  720 ml  Output 600 ml  Net 120 ml       Labs: RENAL PANEL Recent Labs  Lab 10/20/22 1238 10/21/22 0753 10/22/22 0606 10/23/22 0619 10/24/22 0307  NA 136 135 135 136 134*  K 3.9 3.8 4.0 3.9 3.7  CL 100 106 105 106 102  CO2 24 20* 18* 21* 21*  GLUCOSE 331* 253* 190* 164* 175*  BUN 64* 57* 53* 53* 52*  CREATININE 6.69* 6.56* 6.73* 7.01* 7.29*  CALCIUM 8.8* 7.8* 8.1* 8.0* 7.8*  MG  --  2.1  --   --   --   PHOS  --   --  5.0*  --   --   ALBUMIN 3.3* 2.2* 2.9*  --   --     Liver Function Tests: Recent Labs  Lab 10/20/22 1238 10/21/22 0753 10/22/22 0606  AST 15 11*  --   ALT 13 12  --   ALKPHOS 65 47  --   BILITOT 0.2* 0.5  --   PROT 5.2* 4.4*  --   ALBUMIN 3.3* 2.2* 2.9*   Recent Labs  Lab 10/20/22 1238  LIPASE 31   No results for input(s): "AMMONIA" in the last 168 hours. CBC: Recent Labs    10/20/22 1238 10/21/22 0753 10/22/22 0606 10/23/22 [id number] 10/24/22  0307  HGB 9.6* 8.9* 7.9* 7.8* 8.0*  MCV 83.7 86.4 89.2 89.1 86.1  VITAMINB12  --   --  659  --   --   FOLATE  --   --  8.0  --   --   FERRITIN  --   --  204  --   --   TIBC  --   --  188*  --   --   IRON  --   --  76  --   --   RETICCTPCT  --   --  2.9  --   --     Cardiac Enzymes: Recent Labs  Lab 10/21/22 0752  CKTOTAL 57   CBG: Recent Labs  Lab 10/23/22 0844 10/23/22 1217 10/23/22 1620 10/23/22 2244 10/24/22 0816  GLUCAP 233* 277* 196* 238* 267*    Iron Studies:  Recent Labs    10/22/22 0606  IRON 76  TIBC 188*  FERRITIN 204   Studies/Results: No results found.  Medications: Infusions:  ampicillin-sulbactam (UNASYN) IV 3 g (10/23/22 2249)   sodium bicarbonate 75 mEq in sodium chloride 0.45 % 1,075 mL infusion 100 mL/hr at 10/23/22 2242    Scheduled Medications:  amLODipine  10 mg Oral Daily   darbepoetin (ARANESP) injection - NON-DIALYSIS  60 mcg Subcutaneous Q Sat-1800   heparin  5,000 Units Subcutaneous Q8H   insulin aspart  0-5 Units Subcutaneous QHS   insulin  aspart  0-9 Units Subcutaneous TID WC   [START ON 10/25/2022] insulin glargine-yfgn  12 Units Subcutaneous Daily   labetalol  100 mg Oral BID   pantoprazole  40 mg Oral BID    have reviewed scheduled and prn medications.  Physical Exam: General:NAD, comfortable Heart:RRR, s1s2 nl Lungs: Clear b/l,  Abdomen:soft, Non-tender, non-distended Extremities:No edema Neurology: Alert, awake and following commands. Sleepy but arousable  Sabreen Kitchen 10/24/2022,9:17 AM  LOS: 4 days

## 2022-10-24 NOTE — Progress Notes (Signed)
[facility] PROGRESS NOTE   John [name] [id number] DOB: 02-23-1991 DOA: 10/20/2022  PCP: Charlott Rakes, MD  Brief History/Interval Summary: 32 year old Caucasian male history of type 1 diabetes since the age of 20, presented to the ER with a 1-2 year history of worsening back pain, abdominal pain.  Patient has been without insurance for a while.  Has been buying his NovoLog and Lantus insulin from the pharmacy.  In the emergency department blood work showed acute kidney injury.  He was hospitalized for further management.   Consultants: Nephrology  Procedures: None yet    Subjective/Interval History: Patient denies any pain issues this morning.  Back pain and abdominal pain have improved.  No nausea.  Cough is getting better as well.     Assessment/Plan:  Acute kidney injury The last labs we have in our system is from 2021 when he had a creatinine of 1.31.  Presented with a creatinine of 6.69. He does admit to long-term NSAID use.  UA does show proteinuria. CT of the abdomen pelvis does not show any hydronephrosis. Nephrology was consulted.  Patient is on a bicarbonate infusion. Creatinine continues to climb.  Nephrology has spoken with patient about initiating dialysis.  Await further input this morning.  Cough with abnormal chest x-ray/leukocytosis/possible aspiration pneumonia Chest x-ray suggested atelectasis versus opacities.  Patient did have a cough. COVID-19 influenza and RSV PCR's were negative. There was concern for aspiration pneumonia.  Patient started on Unasyn.   Symptoms have improved.  Will plan a 5-day course.  WBC has improved.  Continue with incentive spirometry.  Sinus tachycardia/bilateral pleural effusion/small pericardial effusion Likely due to acute illness.  Chest x-ray and CT of the abdomen pelvis does suggest that he has moderate bilateral pleural effusion and small pericardial effusion.  These are most likely due to renal failure and  hypoalbuminemia.  He was given albumin infusions. TSH was normal.  Patient started on labetalol for high blood pressure.   Heart rate seems to be better.  Echocardiogram is pending.    Abdominal pain with nausea Likely multifactorial.  Lipase level noted to be normal.  LFTs unremarkable.  No concerning findings on CT of the abdomen and pelvis.  Continue with PPI for now.  Nausea appears to be better.  Diabetes mellitus type 1, uncontrolled with hyperglycemia HbA1c is 6.5.  Continue glargine and SSI.  Monitor CBGs.  Seen by diabetes coordinator. At discharge they recommend Basaglar KwikPen's, Humalog KwikPen's, insulin pen needles and prescription for glucose monitoring kit.  Patient interested in Dexcom G7 sensors.  Normocytic anemia Drop in hemoglobin is likely dilutional.  No overt bleeding has been noted.  Anemia panel reviewed.  No deficiencies identified.  His renal failure is likely contributing.  Essential hypertension Blood pressures have been poorly controlled.  Patient was started on amlodipine and labetalol.  Improvement noted.  Continue to monitor for now.  Hydralazine as needed.    Visual impairment Started in November.  Apparently has been seen by retina specialist.  He is supposed to follow-up for further interventions.  All of this is likely driven by his diabetes and hypertension.  Tobacco abuse Counseled.  DVT Prophylaxis: Subcutaneous heparin Code Status: Full code Family Communication: Discussed with patient Disposition Plan: To be determined  Status is: Inpatient Remains inpatient appropriate because: Acute kidney injury      Medications: Scheduled:  amLODipine  10 mg Oral Daily   darbepoetin (ARANESP) injection - NON-DIALYSIS  60 mcg Subcutaneous Q Sat-1800   heparin  5,000 Units Subcutaneous Q8H   insulin aspart  0-5 Units Subcutaneous QHS   insulin aspart  0-9 Units Subcutaneous TID WC   insulin glargine-yfgn  10 Units Subcutaneous Daily   labetalol   100 mg Oral BID   pantoprazole  40 mg Oral BID   Continuous:  ampicillin-sulbactam (UNASYN) IV 3 g (10/23/22 2249)   sodium bicarbonate 75 mEq in sodium chloride 0.45 % 1,075 mL infusion 100 mL/hr at 10/23/22 2242   [id number] **OR** acetaminophen, hydrALAZINE, methocarbamol, nicotine polacrilex, ondansetron **OR** ondansetron (ZOFRAN) IV, oxyCODONE  Antibiotics: Anti-infectives (From admission, onward)    Start     Dose/Rate Route Frequency Ordered Stop   10/21/22 1145  Ampicillin-Sulbactam (UNASYN) 3 g in sodium chloride 0.9 % 100 mL IVPB        3 g 200 mL/hr over 30 Minutes Intravenous Every 12 hours 10/21/22 1058 10/26/22 1144       Objective:  Vital Signs  Vitals:   10/24/22 0307 10/24/22 0400 10/24/22 0500 10/24/22 0800  BP: (!) 143/113 137/88  (!) 159/107  Pulse: 100 (!) 101  100  Resp: '17 16  17  '$ Temp: 98 F (36.7 C)     TempSrc: Oral     SpO2: 93% 90%  90%  Weight:   87 kg   Height:        Intake/Output Summary (Last 24 hours) at 10/24/2022 0844 Last data filed at 10/23/2022 2249 Gross per 24 hour  Intake 1080 ml  Output 600 ml  Net 480 ml    Filed Weights   10/22/22 0500 10/23/22 0500 10/24/22 0500  Weight: 86.6 kg 87 kg 87 kg    General appearance: Awake alert.  In no distress Resp: Improved air entry bilaterally with few crackles at the bases.  No wheezing or rhonchi. Cardio: S1-S2 is normal regular.  No S3-S4.  No rubs murmurs or bruit GI: Abdomen is soft.  Nontender nondistended.  Bowel sounds are present normal.  No masses organomegaly Extremities: Mild edema bilateral lower extremities.  Full range of motion of lower extremities. Neurologic: Alert and oriented x3.  No focal neurological deficits.      Lab Results:  Data Reviewed: I have personally reviewed following labs and reports of the imaging studies  CBC: Recent Labs  Lab 10/20/22 1238 10/21/22 0753 10/22/22 0606 10/23/22 0619 10/24/22 0307  WBC 12.0* 16.4* 13.5* 10.9*  10.0  NEUTROABS 9.0* 13.2*  --   --   --   HGB 9.6* 8.9* 7.9* 7.8* 8.0*  HCT 27.2* 25.4* 24.0* 23.7* 23.0*  MCV 83.7 86.4 89.2 89.1 86.1  PLT 373 323 321 334 303     Basic Metabolic Panel: Recent Labs  Lab 10/20/22 1238 10/21/22 0753 10/22/22 0606 10/23/22 0619 10/24/22 0307  NA 136 135 135 136 134*  K 3.9 3.8 4.0 3.9 3.7  CL 100 106 105 106 102  CO2 24 20* 18* 21* 21*  GLUCOSE 331* 253* 190* 164* 175*  BUN 64* 57* 53* 53* 52*  CREATININE 6.69* 6.56* 6.73* 7.01* 7.29*  CALCIUM 8.8* 7.8* 8.1* 8.0* 7.8*  MG  --  2.1  --   --   --   PHOS  --   --  5.0*  --   --      GFR: Estimated Creatinine Clearance: 15.4 mL/min (A) (by C-G formula based on SCr of 7.29 mg/dL (H)).  Liver Function Tests: Recent Labs  Lab 10/20/22 1238 10/21/22 0753 10/22/22 0606  AST 15 11*  --  ALT 13 12  --   ALKPHOS 65 47  --   BILITOT 0.2* 0.5  --   PROT 5.2* 4.4*  --   ALBUMIN 3.3* 2.2* 2.9*     Recent Labs  Lab 10/20/22 1238  LIPASE 31     CBG: Recent Labs  Lab 10/23/22 0844 10/23/22 1217 10/23/22 1620 10/23/22 2244 10/24/22 0816  GLUCAP 233* 277* 196* 238* 267*     Lipid Profile: No results for input(s): "CHOL", "HDL", "LDLCALC", "TRIG", "CHOLHDL", "LDLDIRECT" in the last 72 hours.    Recent Results (from the past 240 hour(s))  Resp panel by RT-PCR (RSV, Flu A&B, Covid) Anterior Nasal Swab     Status: None   Collection Time: 10/20/22  3:22 PM   Specimen: Anterior Nasal Swab  Result Value Ref Range Status   SARS Coronavirus 2 by RT PCR NEGATIVE NEGATIVE Final    Comment: (NOTE) SARS-CoV-2 target nucleic acids are NOT DETECTED.  The SARS-CoV-2 RNA is generally detectable in upper respiratory specimens during the acute phase of infection. The lowest concentration of SARS-CoV-2 viral copies this assay can detect is 138 copies/mL. A negative result does not preclude SARS-Cov-2 infection and should not be used as the sole basis for treatment or other patient  management decisions. A negative result may occur with  improper specimen collection/handling, submission of specimen other than nasopharyngeal swab, presence of viral mutation(s) within the areas targeted by this assay, and inadequate number of viral copies(<138 copies/mL). A negative result must be combined with clinical observations, patient history, and epidemiological information. The expected result is Negative.  Fact Sheet for Patients:  EntrepreneurPulse.com.au  Fact Sheet for Healthcare Providers:  IncredibleEmployment.be  This test is no t yet approved or cleared by the [place] FDA and  has been authorized for detection and/or diagnosis of SARS-CoV-2 by FDA under an Emergency Use Authorization (EUA). This EUA will remain  in effect (meaning this test can be used) for the duration of the COVID-19 declaration under Section 564(b)(1) of the Act, 21 U.S.C.section 360bbb-3(b)(1), unless the authorization is terminated  or revoked sooner.       Influenza A by PCR NEGATIVE NEGATIVE Final   Influenza B by PCR NEGATIVE NEGATIVE Final    Comment: (NOTE) The Xpert Xpress SARS-CoV-2/FLU/RSV plus assay is intended as an aid in the diagnosis of influenza from Nasopharyngeal swab specimens and should not be used as a sole basis for treatment. Nasal washings and aspirates are unacceptable for Xpert Xpress SARS-CoV-2/FLU/RSV testing.  Fact Sheet for Patients: EntrepreneurPulse.com.au  Fact Sheet for Healthcare Providers: IncredibleEmployment.be  This test is not yet approved or cleared by the [place] FDA and has been authorized for detection and/or diagnosis of SARS-CoV-2 by FDA under an Emergency Use Authorization (EUA). This EUA will remain in effect (meaning this test can be used) for the duration of the COVID-19 declaration under Section 564(b)(1) of the Act, 21 U.S.C. section 360bbb-3(b)(1),  unless the authorization is terminated or revoked.     Resp Syncytial Virus by PCR NEGATIVE NEGATIVE Final    Comment: (NOTE) Fact Sheet for Patients: EntrepreneurPulse.com.au  Fact Sheet for Healthcare Providers: IncredibleEmployment.be  This test is not yet approved or cleared by the [place] FDA and has been authorized for detection and/or diagnosis of SARS-CoV-2 by FDA under an Emergency Use Authorization (EUA). This EUA will remain in effect (meaning this test can be used) for the duration of the COVID-19 declaration under Section 564(b)(1) of the Act, 21 U.S.C. section  360bbb-3(b)(1), unless the authorization is terminated or revoked.  Performed at [facility], [address] [place], [place] [address]       Radiology Studies: No results found.     LOS: 4 days   Czarina Gingras [employer] on [email or web address]  10/24/2022, 8:44 AM

## 2022-10-24 NOTE — Progress Notes (Signed)
  Echocardiogram 2D Echocardiogram has been performed.  Lana Fish 10/24/2022, 1:22 PM

## 2022-10-24 NOTE — Consult Note (Addendum)
Hospital Consult    Reason for Consult:  in need of dialysis access Requesting Physician:  Hollie Salk MRN #:  [id number]  History of Present Illness: This is a 32 y.o. male who presented to the hospital with advanced renal failure (presumably progressive CKD in setting of uncontrolled DM).  He has hx of uncontrolled type 1 DM, HTN, anemia of CKD, cough/possible PNA.  His girlfriend is at bedside.  She states that before coming to the hospital, the pt would eat, and then swell up and get nauseated.  She states that they are feeling overwhelmed.  The pt has a 48 year old son and they are doing everything they can to get him better.   Vascular surgery has been consulted for access placement.   He is right hand dominant.    The pt is not on a statin for cholesterol management.  The pt is not on a daily aspirin.   Other AC:  sq heparin The pt is on CCB, BB for hypertension.   The pt is diabetic.   Tobacco hx:  current  Past Medical History:  Diagnosis Date   ADD (attention deficit disorder)    Allergic rhinitis    Asthma    Diabetic ketoacidosis without coma associated with type 1 diabetes mellitus (HCC)    DKA (diabetic ketoacidoses)    Goiter, unspecified 02/04/2011   Type I (juvenile type) diabetes mellitus without mention of complication, uncontrolled 02/04/2011    Past Surgical History:  Procedure Laterality Date   TONSILLECTOMY      No Known Allergies  Prior to Admission medications   Medication Sig Start Date End Date Taking? Authorizing Provider  Calcium Carbonate Antacid (TUMS PO) Take 1 tablet by mouth 2 (two) times daily as needed (heartburn).   Yes [provider]  insulin aspart (NOVOLOG) 100 UNIT/ML injection Inject 5 Units into the skin in the morning, at noon, and at bedtime. Carb coverage and correction   Yes [provider]  insulin glargine (LANTUS) 100 UNIT/ML injection Inject 23 Units into the skin at bedtime.   Yes [provider]   Tetrahydrozoline HCl (VISINE OP) Place 2 drops into both eyes as needed (dry eye).   Yes [provider]  glucose blood (ONE TOUCH ULTRA TEST) test strip Use as instructed 3x daily 05/04/17   Lucila Maine C, DO  Insulin Pen Needle 31G X 8 MM MISC 1 each by Does not apply route 4 (four) times daily -  with meals and at bedtime. 03/24/15   Charlott Rakes, MD    Social History   Socioeconomic History   Marital status: Single    Spouse name: Not on file   Number of children: Not on file   Years of education: Not on file   Highest education level: Not on file  Occupational History   Not on file  Tobacco Use   Smoking status: Every Day    Packs/day: 1.00    Types: Cigarettes   Smokeless tobacco: Former    Quit date: 06/26/2013  Vaping Use   Vaping Use: Never used  Substance and Sexual Activity   Alcohol use: Yes    Alcohol/week: 12.0 standard drinks of alcohol    Types: 12 Cans of beer per week   Drug use: Yes    Types: Marijuana    Comment: smokes, every day   Sexual activity: Not on file  Other Topics Concern   Not on file  Social History Narrative   Regular exercise-yes  Social Determinants of Health   Financial Resource Strain: Not on file  Food Insecurity: Not on file  Transportation Needs: Not on file  Physical Activity: Not on file  Stress: Not on file  Social Connections: Not on file  Intimate Partner Violence: Not on file     Family History  Problem Relation Age of Onset   Lupus Cousin    Cancer Neg Hx     ROS: '[x]'$  Positive   '[ ]'$  Negative   '[ ]'$  All sytems reviewed and are negative  Cardiac: '[x]'$  hypertension   Vascular: '[x]'$  swelling  Pulmonary: '[x]'$  asthma '[]'$  home O2  Neurologic: '[]'$  hx of CVA '[]'$  mini stroke   Hematologic: '[]'$  hx of cancer  Endocrine:   '[x]'$  diabetes '[]'$  thyroid disease  GI '[]'$  GERD  GU: '[x]'$  ARF  Psychiatric: '[]'$  anxiety '[]'$  depression  Musculoskeletal: '[]'$  arthritis '[]'$  joint pain  Integumentary: '[]'$  rashes  '[]'$  ulcers  Constitutional: '[]'$  fever  '[]'$  chills  Physical Examination  Vitals:   10/24/22 0400 10/24/22 0800  BP: 137/88 (!) 159/107  Pulse: (!) 101 100  Resp: 16 17  Temp:    SpO2: 90% 90%   Body mass index is 27.52 kg/m.  General:  WDWN in NAD Gait: Not observed HENT: WNL, normocephalic Pulmonary: normal non-labored breathing Cardiac: regular Skin: without rashes Vascular Exam/Pulses:  Right Left  Radial 2+ (normal) 2+ (normal)  DP 2+ (normal) 2+ (normal)   Extremities: IV left antecubital space Musculoskeletal: no muscle wasting or atrophy  Neurologic: alert Psychiatric:  The pt has  flat  affect.   CBC    Component Value Date/Time   WBC 10.0 10/24/2022 0307   RBC 2.67 (L) 10/24/2022 0307   HGB 8.0 (L) 10/24/2022 0307   HCT 23.0 (L) 10/24/2022 0307   PLT 303 10/24/2022 0307   MCV 86.1 10/24/2022 0307   MCH 30.0 10/24/2022 0307   MCHC 34.8 10/24/2022 0307   RDW 13.1 10/24/2022 0307   LYMPHSABS 1.8 10/21/2022 0753   MONOABS 1.0 10/21/2022 0753   EOSABS 0.2 10/21/2022 0753   BASOSABS 0.1 10/21/2022 0753    BMET    Component Value Date/Time   NA 134 (L) 10/24/2022 0307   K 3.7 10/24/2022 0307   CL 102 10/24/2022 0307   CO2 21 (L) 10/24/2022 0307   GLUCOSE 175 (H) 10/24/2022 0307   BUN 52 (H) 10/24/2022 0307   CREATININE 7.29 (H) 10/24/2022 0307   CALCIUM 7.8 (L) 10/24/2022 0307   GFRNONAA 10 (L) 10/24/2022 0307   GFRAA >60 03/02/2020 1012    COAGS: Lab Results  Component Value Date   INR 1.20 07/08/2014     Non-Invasive Vascular Imaging:   BUE vein mapping has been ordered.     ASSESSMENT/PLAN: This is a 32 y.o. male with ARF with presumably progressive CKD in setting of uncontrolled DM in need of dialysis access   -pt is right hand dominant.  He does have an IV in the left antecubital space-I have placed order to move IV to the right arm and restrict left arm.   -I discussed fistula vs graft and we would determine plan once vein  mapping has been ordered.  I discussed that access does not always mature like we would like and may need additional procedures to help it along.  I also discussed that access does not last forever and he would eventually need new access or another procedure to help access continue working.  -Dr. Donzetta Matters to evaluate pt  and determine further plan   Leontine Locket, PA-C [facility] [phone]  I have independently interviewed and examined patient and agree with PA assessment and plan above.  Venous duplex was interpreted and results discussed with the patient and his significant other at the bedside.  Plan for tunneled dialysis catheter and likely fistula possibly graft tomorrow in the OR.  He will be n.p.o. past midnight.  Risk benefits and alternatives discussed and all questions answered.  Camaron C. Donzetta Matters, MD [facility] [place] Office: [phone] Pager: [phone]

## 2022-10-24 NOTE — Progress Notes (Signed)
Bilateral upper extremity vein mapping study completed.   Please see CV Procedures for preliminary results.  Caffie Sotto, RVT  2:36 PM 10/24/22

## 2022-10-24 NOTE — Inpatient Diabetes Management (Signed)
Inpatient Diabetes Program Recommendations  AACE/ADA: New Consensus Statement on Inpatient Glycemic Control (2015)  Target Ranges:  Prepandial:   less than 140 mg/dL      Peak postprandial:   less than 180 mg/dL (1-2 hours)      Critically ill patients:  140 - 180 mg/dL   Lab Results  Component Value Date   GLUCAP 267 (H) 10/24/2022   HGBA1C 6.5 (H) 10/21/2022    Latest Reference Range & Units 10/23/22 08:44 10/23/22 12:17 10/23/22 16:20 10/23/22 22:44 10/24/22 08:16  Glucose-Capillary 70 - 99 mg/dL 233 (H) 277 (H) 196 (H) 238 (H) 267 (H)  (H): Data is abnormally high  Diabetes history: DM1 (does not make any insulin; requires basal, correction, and carb coverage insulin) Outpatient Diabetes medications: Lantus 23 units QHS, Novolog 5-10 units TID with meals Current orders for Inpatient glycemic control: Semglee 12 units daily, Novolog 0-9 units TID with meals, Novolog 0-5 units QHS  Inpatient Diabetes Program Recommendations:   Please consider: -Add Novolog 2 units tid meal coverage if patient eats 50% meals  Thank you, Bethena Roys E. Ellouise Mcwhirter, RN, MSN, CDE  Diabetes Coordinator Inpatient Glycemic Control Team Team Pager [phone] (8am-5pm) 10/24/2022 10:32 AM

## 2022-10-25 ENCOUNTER — Inpatient Hospital Stay (HOSPITAL_COMMUNITY): Payer: Commercial Managed Care - HMO | Admitting: Certified Registered Nurse Anesthetist

## 2022-10-25 ENCOUNTER — Inpatient Hospital Stay (HOSPITAL_COMMUNITY): Payer: Commercial Managed Care - HMO

## 2022-10-25 ENCOUNTER — Encounter (HOSPITAL_COMMUNITY): Payer: Self-pay | Admitting: Internal Medicine

## 2022-10-25 ENCOUNTER — Other Ambulatory Visit: Payer: Self-pay

## 2022-10-25 ENCOUNTER — Encounter (HOSPITAL_COMMUNITY)
Admission: EM | Disposition: A | Discharge status: Home / Self Care | Payer: Self-pay | Source: Home / Self Care | Attending: Internal Medicine

## 2022-10-25 DIAGNOSIS — J45909 Unspecified asthma, uncomplicated: Secondary | ICD-10-CM

## 2022-10-25 DIAGNOSIS — I120 Hypertensive chronic kidney disease with stage 5 chronic kidney disease or end stage renal disease: Secondary | ICD-10-CM

## 2022-10-25 DIAGNOSIS — N185 Chronic kidney disease, stage 5: Secondary | ICD-10-CM

## 2022-10-25 DIAGNOSIS — N179 Acute kidney failure, unspecified: Secondary | ICD-10-CM | POA: Diagnosis not present

## 2022-10-25 DIAGNOSIS — E1022 Type 1 diabetes mellitus with diabetic chronic kidney disease: Secondary | ICD-10-CM

## 2022-10-25 DIAGNOSIS — N186 End stage renal disease: Secondary | ICD-10-CM

## 2022-10-25 DIAGNOSIS — F17210 Nicotine dependence, cigarettes, uncomplicated: Secondary | ICD-10-CM

## 2022-10-25 DIAGNOSIS — I10 Essential (primary) hypertension: Secondary | ICD-10-CM | POA: Diagnosis not present

## 2022-10-25 HISTORY — PX: AV FISTULA PLACEMENT: SHX1204

## 2022-10-25 HISTORY — PX: INSERTION OF DIALYSIS CATHETER: SHX1324

## 2022-10-25 LAB — BASIC METABOLIC PANEL
Anion gap: 12 (ref 5–15)
BUN: 53 mg/dL — ABNORMAL HIGH (ref 6–20)
CO2: 20 mmol/L — ABNORMAL LOW (ref 22–32)
Calcium: 7.8 mg/dL — ABNORMAL LOW (ref 8.9–10.3)
Chloride: 100 mmol/L (ref 98–111)
Creatinine, Ser: 7.76 mg/dL — ABNORMAL HIGH (ref 0.61–1.24)
GFR, Estimated: 9 mL/min — ABNORMAL LOW (ref >60)
Glucose, Bld: 288 mg/dL — ABNORMAL HIGH (ref 70–99)
Potassium: 4 mmol/L (ref 3.5–5.1)
Sodium: 132 mmol/L — ABNORMAL LOW (ref 135–145)

## 2022-10-25 LAB — CBC
HCT: 24.1 % — ABNORMAL LOW (ref 39.0–52.0)
Hemoglobin: 8.6 g/dL — ABNORMAL LOW (ref 13.0–17.0)
MCH: 30.7 pg (ref 26.0–34.0)
MCHC: 35.7 g/dL (ref 30.0–36.0)
MCV: 86.1 fL (ref 80.0–100.0)
Platelets: 314 10*3/uL (ref 150–400)
RBC: 2.8 MIL/uL — ABNORMAL LOW (ref 4.22–5.81)
RDW: 12.8 % (ref 11.5–15.5)
WBC: 12.1 10*3/uL — ABNORMAL HIGH (ref 4.0–10.5)
nRBC: 0 % (ref 0.0–0.2)

## 2022-10-25 LAB — GLUCOSE, CAPILLARY
Glucose-Capillary: 279 mg/dL — ABNORMAL HIGH (ref 70–99)
Glucose-Capillary: 303 mg/dL — ABNORMAL HIGH (ref 70–99)
Glucose-Capillary: 277 mg/dL — ABNORMAL HIGH (ref 70–99)
Glucose-Capillary: 277 mg/dL — ABNORMAL HIGH (ref 70–99)
Glucose-Capillary: 238 mg/dL — ABNORMAL HIGH (ref 70–99)
Glucose-Capillary: 224 mg/dL — ABNORMAL HIGH (ref 70–99)

## 2022-10-25 LAB — PHOSPHORUS: Phosphorus: 5.9 mg/dL — ABNORMAL HIGH (ref 2.5–4.6)

## 2022-10-25 LAB — MAGNESIUM: Magnesium: 2.1 mg/dL (ref 1.7–2.4)

## 2022-10-25 LAB — PARATHYROID HORMONE, INTACT (NO CA): PTH: 26 pg/mL (ref 15–65)

## 2022-10-25 LAB — HEPATITIS B SURFACE ANTIGEN: Hepatitis B Surface Ag: NONREACTIVE

## 2022-10-25 SURGERY — INSERTION OF DIALYSIS CATHETER
Anesthesia: General | Site: Chest | Laterality: Right

## 2022-10-25 MED ORDER — FENTANYL CITRATE (PF) 100 MCG/2ML IJ SOLN
INTRAMUSCULAR | Status: AC
  Filled 2022-10-25: qty 2

## 2022-10-25 MED ORDER — 0.9 % SODIUM CHLORIDE (POUR BTL) OPTIME
TOPICAL | Status: DC | PRN
  Administered 2022-10-25: 1000 mL

## 2022-10-25 MED ORDER — PROPOFOL 10 MG/ML IV BOLUS
INTRAVENOUS | Status: DC | PRN
  Administered 2022-10-25: 160 mg via INTRAVENOUS
  Administered 2022-10-25: 40 mg via INTRAVENOUS

## 2022-10-25 MED ORDER — PROMETHAZINE HCL 25 MG/ML IJ SOLN: 6.2500 mg | INTRAMUSCULAR | Status: DC | PRN

## 2022-10-25 MED ORDER — ONDANSETRON HCL 4 MG/2ML IJ SOLN
INTRAMUSCULAR | Status: DC | PRN
  Administered 2022-10-25: 4 mg via INTRAVENOUS

## 2022-10-25 MED ORDER — SODIUM CHLORIDE 0.9 % IV SOLN: INTRAVENOUS | Status: DC

## 2022-10-25 MED ORDER — HEPARIN 6000 UNIT IRRIGATION SOLUTION
Status: DC | PRN
  Administered 2022-10-25: 1

## 2022-10-25 MED ORDER — HEPARIN SODIUM (PORCINE) 1000 UNIT/ML IJ SOLN
INTRAMUSCULAR | Status: AC
  Filled 2022-10-25: qty 10

## 2022-10-25 MED ORDER — CEFAZOLIN SODIUM-DEXTROSE 2-4 GM/100ML-% IV SOLN
INTRAVENOUS | Status: AC
  Filled 2022-10-25: qty 100

## 2022-10-25 MED ORDER — ACETAMINOPHEN 500 MG PO TABS
1000.0000 mg | ORAL_TABLET | Freq: Once | ORAL | Status: AC
  Administered 2022-10-25: 1000 mg via ORAL
  Filled 2022-10-25: qty 2

## 2022-10-25 MED ORDER — PHENYLEPHRINE HCL-NACL 20-0.9 MG/250ML-% IV SOLN
INTRAVENOUS | Status: DC | PRN
  Administered 2022-10-25: 15 ug/min via INTRAVENOUS

## 2022-10-25 MED ORDER — HEPARIN SODIUM (PORCINE) 1000 UNIT/ML IJ SOLN
INTRAMUSCULAR | Status: DC | PRN
  Administered 2022-10-25: 3200 [IU]

## 2022-10-25 MED ORDER — CHLORHEXIDINE GLUCONATE CLOTH 2 % EX PADS
6.0000 | MEDICATED_PAD | Freq: Every day | CUTANEOUS | Status: DC
  Administered 2022-10-26 – 2022-10-29 (×4): 6 via TOPICAL

## 2022-10-25 MED ORDER — LACTATED RINGERS IV SOLN: INTRAVENOUS | Status: DC

## 2022-10-25 MED ORDER — LIDOCAINE-EPINEPHRINE (PF) 1 %-1:200000 IJ SOLN
INTRAMUSCULAR | Status: AC
  Filled 2022-10-25: qty 30

## 2022-10-25 MED ORDER — FENTANYL CITRATE (PF) 100 MCG/2ML IJ SOLN
25.0000 ug | INTRAMUSCULAR | Status: DC | PRN
  Administered 2022-10-25: 50 ug via INTRAVENOUS

## 2022-10-25 MED ORDER — INSULIN ASPART 100 UNIT/ML IJ SOLN
0.0000 [IU] | INTRAMUSCULAR | Status: DC | PRN
  Administered 2022-10-25: 5 [IU] via SUBCUTANEOUS
  Filled 2022-10-25: qty 1

## 2022-10-25 MED ORDER — ORAL CARE MOUTH RINSE: 15.0000 mL | Freq: Once | OROMUCOSAL | Status: AC

## 2022-10-25 MED ORDER — OXYCODONE HCL 5 MG/5ML PO SOLN: 5.0000 mg | Freq: Once | ORAL | Status: DC | PRN

## 2022-10-25 MED ORDER — CEFAZOLIN SODIUM-DEXTROSE 2-4 GM/100ML-% IV SOLN
2.0000 g | Freq: Once | INTRAVENOUS | Status: AC
  Administered 2022-10-25: 2 g via INTRAVENOUS

## 2022-10-25 MED ORDER — CHLORHEXIDINE GLUCONATE 0.12 % MT SOLN
15.0000 mL | Freq: Once | OROMUCOSAL | Status: AC
  Administered 2022-10-25: 15 mL via OROMUCOSAL
  Filled 2022-10-25: qty 15

## 2022-10-25 MED ORDER — LIDOCAINE-EPINEPHRINE (PF) 1 %-1:200000 IJ SOLN
INTRAMUSCULAR | Status: DC | PRN
  Administered 2022-10-25: 8.5 mL

## 2022-10-25 MED ORDER — MIDAZOLAM HCL 2 MG/2ML IJ SOLN
INTRAMUSCULAR | Status: AC
  Filled 2022-10-25: qty 2

## 2022-10-25 MED ORDER — LIDOCAINE 2% (20 MG/ML) 5 ML SYRINGE
INTRAMUSCULAR | Status: DC | PRN
  Administered 2022-10-25: 60 mg via INTRAVENOUS

## 2022-10-25 MED ORDER — HEPARIN 6000 UNIT IRRIGATION SOLUTION
Status: AC
  Filled 2022-10-25: qty 500

## 2022-10-25 MED ORDER — FENTANYL CITRATE (PF) 250 MCG/5ML IJ SOLN
INTRAMUSCULAR | Status: AC
  Filled 2022-10-25: qty 5

## 2022-10-25 MED ORDER — PROPOFOL 10 MG/ML IV BOLUS
INTRAVENOUS | Status: AC
  Filled 2022-10-25: qty 20

## 2022-10-25 MED ORDER — FENTANYL CITRATE (PF) 250 MCG/5ML IJ SOLN
INTRAMUSCULAR | Status: DC | PRN
  Administered 2022-10-25 (×2): 50 ug via INTRAVENOUS

## 2022-10-25 MED ORDER — MIDAZOLAM HCL 5 MG/5ML IJ SOLN
INTRAMUSCULAR | Status: DC | PRN
  Administered 2022-10-25: 2 mg via INTRAVENOUS

## 2022-10-25 MED ORDER — OXYCODONE HCL 5 MG PO TABS: 5.0000 mg | ORAL_TABLET | Freq: Once | ORAL | Status: DC | PRN

## 2022-10-25 SURGICAL SUPPLY — 56 items
ARMBAND PINK RESTRICT EXTREMIT (MISCELLANEOUS) ×2 IMPLANT
BAG COUNTER SPONGE SURGICOUNT (BAG) ×2 IMPLANT
BAG DECANTER FOR FLEXI CONT (MISCELLANEOUS) ×2 IMPLANT
BIOPATCH RED 1 DISK 7.0 (GAUZE/BANDAGES/DRESSINGS) ×2 IMPLANT
CANISTER SUCT 3000ML PPV (MISCELLANEOUS) ×2 IMPLANT
CATH PALINDROME-P 19CM W/VT (CATHETERS) IMPLANT
CATH PALINDROME-P 23CM W/VT (CATHETERS) IMPLANT
CATH PALINDROME-P 28CM W/VT (CATHETERS) IMPLANT
CLIP LIGATING EXTRA MED SLVR (CLIP) ×2 IMPLANT
CLIP LIGATING EXTRA SM BLUE (MISCELLANEOUS) ×2 IMPLANT
COVER PROBE W GEL 5X96 (DRAPES) ×2 IMPLANT
COVER SURGICAL LIGHT HANDLE (MISCELLANEOUS) ×2 IMPLANT
DERMABOND ADVANCED .7 DNX12 (GAUZE/BANDAGES/DRESSINGS) ×2 IMPLANT
DRAPE C-ARM 42X72 X-RAY (DRAPES) ×2 IMPLANT
DRAPE CHEST BREAST 15X10 FENES (DRAPES) ×2 IMPLANT
DRAPE HALF SHEET 40X57 (DRAPES) IMPLANT
ELECT REM PT RETURN 9FT ADLT (ELECTROSURGICAL) ×2
ELECTRODE REM PT RTRN 9FT ADLT (ELECTROSURGICAL) ×2 IMPLANT
GAUZE 4X4 16PLY ~~LOC~~+RFID DBL (SPONGE) ×2 IMPLANT
GLOVE BIO SURGEON STRL SZ7.5 (GLOVE) ×2 IMPLANT
GOWN STRL REUS W/ TWL LRG LVL3 (GOWN DISPOSABLE) ×4 IMPLANT
GOWN STRL REUS W/ TWL XL LVL3 (GOWN DISPOSABLE) ×2 IMPLANT
GOWN STRL REUS W/TWL LRG LVL3 (GOWN DISPOSABLE) ×4
GOWN STRL REUS W/TWL XL LVL3 (GOWN DISPOSABLE) ×2
INSERT FOGARTY SM (MISCELLANEOUS) IMPLANT
KIT BASIN OR (CUSTOM PROCEDURE TRAY) ×2 IMPLANT
KIT PALINDROME-P 55CM (CATHETERS) IMPLANT
KIT TURNOVER KIT B (KITS) ×2 IMPLANT
LOOP VASCULAR MINI 18 RED (MISCELLANEOUS) ×2
NDL 18GX1X1/2 (RX/OR ONLY) (NEEDLE) ×2 IMPLANT
NDL HYPO 25GX1X1/2 BEV (NEEDLE) ×2 IMPLANT
NEEDLE 18GX1X1/2 (RX/OR ONLY) (NEEDLE) ×2 IMPLANT
NEEDLE HYPO 25GX1X1/2 BEV (NEEDLE) ×2 IMPLANT
NS IRRIG 1000ML POUR BTL (IV SOLUTION) ×2 IMPLANT
PACK BASIC III (CUSTOM PROCEDURE TRAY) ×2
PACK CV ACCESS (CUSTOM PROCEDURE TRAY) ×2 IMPLANT
PACK SRG BSC III STRL LF ECLPS (CUSTOM PROCEDURE TRAY) ×2 IMPLANT
PAD ARMBOARD 7.5X6 YLW CONV (MISCELLANEOUS) ×4 IMPLANT
POWDER SURGICEL 3.0 GRAM (HEMOSTASIS) IMPLANT
SLING ARM FOAM STRAP LRG (SOFTGOODS) IMPLANT
SLING ARM FOAM STRAP MED (SOFTGOODS) IMPLANT
SOAP 2 % CHG 4 OZ (WOUND CARE) ×2 IMPLANT
SUT ETHILON 3 0 PS 1 (SUTURE) ×2 IMPLANT
SUT MNCRL AB 4-0 PS2 18 (SUTURE) ×2 IMPLANT
SUT PROLENE 6 0 BV (SUTURE) ×2 IMPLANT
SUT VIC AB 3-0 SH 27 (SUTURE) ×2
SUT VIC AB 3-0 SH 27X BRD (SUTURE) ×2 IMPLANT
SYR 10ML LL (SYRINGE) ×2 IMPLANT
SYR 20ML LL LF (SYRINGE) ×4 IMPLANT
SYR 5ML LL (SYRINGE) ×2 IMPLANT
SYR CONTROL 10ML LL (SYRINGE) ×2 IMPLANT
TOWEL GREEN STERILE (TOWEL DISPOSABLE) ×2 IMPLANT
TOWEL GREEN STERILE FF (TOWEL DISPOSABLE) ×4 IMPLANT
UNDERPAD 30X36 HEAVY ABSORB (UNDERPADS AND DIAPERS) ×2 IMPLANT
VASCULAR TIE MINI RED 18IN STL (MISCELLANEOUS) IMPLANT
WATER STERILE IRR 1000ML POUR (IV SOLUTION) ×2 IMPLANT

## 2022-10-25 NOTE — Op Note (Signed)
Patient name: John [name] MRN: [id number] DOB: [birth year] Sex: male  10/25/2022 Pre-operative Diagnosis: esrd Post-operative diagnosis:  Same Surgeon:  Erlene Quan C. Donzetta Matters, MD Assistant: Paulo Fruit, PA Procedure Performed: 1.  Right IJ 19 cm tunneled dialysis catheter placement with ultrasound and fluoroscopic guidance 2.  Creation of left brachial artery to cephalic vein AV fistula  Indications: 32 year old male with history of now end-stage renal disease in need of permanent and temporary dialysis access.  We have discussed proceeding with catheter and fistula versus graft.  Patient is right-hand dominant and he along with his significant other have agreed to proceed.  Findings: Right IJ was patent and compressible and catheter was placed in the SVC atrial junction.  Left arm cephalic vein was large was easily dilated to 4 mm and at completion there was a very strong thrill and a palpable radial artery pulse the wrist both confirmed with Doppler.   Procedure:  The patient was identified in the holding area and taken to the operating room where is placed supine operative table and LMA anesthesia was induced.  He was sterilely prepped and draped in the bilateral neck and chest and left upper extremity in usual fashion, antibiotics were administered and a timeout was called.  We began using ultrasound to identify the right internal jugular vein which was large and patent and compressible.  The vein was cannulated with an 18-gauge needle and a wire was placed centrally.  We then tunneled a 19 cm catheter from a counterincision.  The wire tract was serially dilated and introducer sheath was placed.  19 cm catheter was placed under fluoroscopic guidance to the SVC atrial junction.  It was flushed with heparinized saline and affixed to the skin with nylon suture and the neck cannulation site was closed with 4-0 Monocryl and Dermabond was placed at both sites.  Catheter was then locked with 1.6 cc of  concentrated heparin on the port.  Attention was then turned to the left upper extremity.  Ultrasound demonstrated what appeared to be a suitable cephalic vein and brachial artery.  A transverse incision was created and we then instilled 1% lidocaine with epinephrine.  The vein was dissected out for several centimeters and branches were divided between clips and ties.  Deep fascia was incised and the brachial artery was identified and encircled with vessel loop.  The vein was transected distally wife orientation serially dilated to 4 mm flushed heparinized saline and clamped.  The artery was clamped distally and proximally opened longitudinally and flushed thoroughly saline in both directions.  The vein was sewn into side with 6-0 Prolene suture.  Prior to completion without flushing in all directions.  Upon completion there was a very strong thrill in the fistula.  We did free up some of the soft tissue around the fistula to allow it to seat nicely.  There was a strong thrill confirmed with Doppler throughout the upper arm and a palpable radial artery pulse the wrist.  The wound was irrigated and hemostasis obtained and we closed in layers with Vicryl and Monocryl.  Dermabond was placed at the skin site.  Patient was awakened from anesthesia having tolerated procedure without any complication.  All counts were correct at completion.  An experienced assistant was necessary to facilitate the creation of the fistula including exposure of the vein and artery and performing the anastomosis.   EBL: 20cc  Markian C. Donzetta Matters, MD [facility] [place] Office: [phone] Pager: [phone]

## 2022-10-25 NOTE — Anesthesia Preprocedure Evaluation (Addendum)
Anesthesia Evaluation  Patient identified by MRN, date of birth, ID band Patient awake    Reviewed: Allergy & Precautions, NPO status , Patient's Chart, lab work & pertinent test results  History of Anesthesia Complications Negative for: history of anesthetic complications  Airway Mallampati: II  TM Distance: >3 FB Neck ROM: Full    Dental  (+) Dental Advisory Given, Partial Upper   Pulmonary asthma , Current Smoker and Patient abstained from smoking.   Pulmonary exam normal        Cardiovascular hypertension, Normal cardiovascular exam     Neuro/Psych  Headaches  negative psych ROS   GI/Hepatic negative GI ROS,,,(+)     substance abuse  alcohol use  Endo/Other  diabetes, Type 1, Insulin Dependent    Renal/GU ESRFRenal disease     Musculoskeletal negative musculoskeletal ROS (+)    Abdominal   Peds  (+) ATTENTION DEFICIT DISORDER WITHOUT HYPERACTIVITY Hematology negative hematology ROS (+)   Anesthesia Other Findings   Reproductive/Obstetrics                             Anesthesia Physical Anesthesia Plan  ASA: 3  Anesthesia Plan: General   Post-op Pain Management: Tylenol PO (pre-op)*   Induction: Intravenous  PONV Risk Score and Plan: 1 and Treatment may vary due to age or medical condition, Ondansetron and Midazolam  Airway Management Planned: LMA  Additional Equipment: None  Intra-op Plan:   Post-operative Plan: Extubation in OR  Informed Consent: I have reviewed the patients History and Physical, chart, labs and discussed the procedure including the risks, benefits and alternatives for the proposed anesthesia with the patient or authorized representative who has indicated his/her understanding and acceptance.     Dental advisory given  Plan Discussed with: CRNA and Anesthesiologist  Anesthesia Plan Comments:        Anesthesia Quick Evaluation

## 2022-10-25 NOTE — Anesthesia Postprocedure Evaluation (Signed)
Anesthesia Post Note  Patient: LAURITZ TUNGATE  Procedure(s) Performed: INSERTION OF TUNNELED PALINDROME 14.5 FR X 19 CM DIALYSIS CATHETER (Right: Chest) CEPHALIC ARTERIOVENOUS (AV) FISTULA CREATION (Left: Arm Upper)     Patient location during evaluation: PACU Anesthesia Type: General Level of consciousness: awake and alert Pain management: pain level controlled Vital Signs Assessment: post-procedure vital signs reviewed and stable Respiratory status: spontaneous breathing, nonlabored ventilation, respiratory function stable and patient connected to nasal cannula oxygen Cardiovascular status: stable and blood pressure returned to baseline Anesthetic complications: no   No notable events documented.  Last Vitals:  Vitals:   10/25/22 1130 10/25/22 1149  BP: (!) 150/92 (!) 150/97  Pulse: 95 96  Resp: 11 16  Temp: 36.5 C (!) 36.3 C  SpO2: 94% 92%    Last Pain:  Vitals:   10/25/22 1149  TempSrc: Oral  PainSc:                  Audry Pili

## 2022-10-25 NOTE — Progress Notes (Addendum)
[facility]  Subjective  - no new changes   Objective (!) 141/93 100 98.2 F (36.8 C) (Oral) 15 92%  Intake/Output Summary (Last 24 hours) at 10/25/2022 0700 Last data filed at 10/24/2022 2200 Gross per 24 hour  Intake --  Output 600 ml  Net -600 ml    B UE motor, sensation and palpable radial pulses intact Lungs non labored breathing General no acute distress  Assessment/Planning: Acute on chronic kidney disease.   Plan for left UE AV fistula verses graft and TDC NPO  Roxy Horseman 10/25/2022 7:00 AM --  Laboratory Lab Results: Recent Labs    10/23/22 0619 10/24/22 0307  WBC 10.9* 10.0  HGB 7.8* 8.0*  HCT 23.7* 23.0*  PLT 334 303   BMET Recent Labs    10/23/22 0619 10/24/22 0307  NA 136 134*  K 3.9 3.7  CL 106 102  CO2 21* 21*  GLUCOSE 164* 175*  BUN 53* 52*  CREATININE 7.01* 7.29*  CALCIUM 8.0* 7.8*    COAG Lab Results  Component Value Date   INR 1.20 07/08/2014   No results found for: "PTT"  I have independently interviewed and examined patient and agree with PA assessment and plan above.  Plan is for [facility] in the right IJ and left arm access fistula with possible graft.  Again reiterated risk and benefits.  Consent has been signed by his girlfriend who he states is his next of kin though she does not have legal power of attorney.  Jayant C. Donzetta Matters, MD [facility] [place] [facility] Office: [phone] Pager: [phone]

## 2022-10-25 NOTE — TOC Progression Note (Addendum)
Transition of Care [facility]) - Progression Note    Patient Details  Name: John [name] MRN: [id number] Date of Birth: Feb 10, 1991  [facility]) CM/SW Contact  Levonne Lapping, RN Phone Number: 10/25/2022, 1:18 PM  Clinical Narrative:    CM acknowledges "Consult to [facility]" for PCP   CM sent request for PCP to be established  TOC will continue to follow patient for any additional discharge needs        Barriers to Discharge: Continued Medical Work up, Inadequate or no insurance  Expected Discharge Plan and Services                                               Social Determinants of Health (SDOH) Interventions SDOH Screenings   Tobacco Use: High Risk (10/25/2022)    Readmission Risk Interventions     No data to display

## 2022-10-25 NOTE — Progress Notes (Signed)
[facility] PROGRESS NOTE   John [name] [id number] DOB: 1991-05-05 DOA: 10/20/2022  PCP: Charlott Rakes, MD  Brief History/Interval Summary: 32 year old Caucasian male history of type 1 diabetes since the age of 59, presented to the ER with a 1-2 year history of worsening back pain, abdominal pain.  Patient has been without insurance for a while.  Has been buying his NovoLog and Lantus insulin from the pharmacy.  In the emergency department blood work showed acute kidney injury.  He was hospitalized for further management.   Consultants: Nephrology.  Vascular surgery  Procedures: Echocardiogram    Subjective/Interval History: No overnight issues.  Waiting on a surgical intervention for access placement today.  His girlfriend is at the bedside.     Assessment/Plan:  Acute kidney injury The last labs we have in our system is from 2021 when he had a creatinine of 1.31.  Presented with a creatinine of 6.69. He does admit to long-term NSAID use.  UA does show proteinuria. CT of the abdomen pelvis does not show any hydronephrosis. Nephrology was consulted.  Patient was given IV fluids and then placed on bicarbonate infusion. Creatinine continues to climb.  Management per nephrology.  Looks like plan is to initiate dialysis.  Vascular surgery was consulted.  Plan is for access placement today.  Cough with abnormal chest x-ray/leukocytosis/possible aspiration pneumonia Chest x-ray suggested atelectasis versus opacities.  Patient did have a cough. COVID-19 influenza and RSV PCR's were negative. There was concern for aspiration pneumonia.  Patient started on Unasyn.   Symptoms have improved.  Initial plan was for a 5-day course.  WBC noted to be elevated this morning.  Fever recorded this morning.  Significance unclear.  Will have to recheck his temperature.  If still febrile then blood cultures will need to be ordered.  May need to extend the course of his antibiotics.    Continue with incentive spirometry.  Sinus tachycardia/bilateral pleural effusion/small pericardial effusion Likely due to acute illness.  Chest x-ray and CT of the abdomen pelvis does suggest that he has moderate bilateral pleural effusion and small pericardial effusion.  These are most likely due to renal failure and hypoalbuminemia.  He was given albumin infusions. TSH was normal.  Patient started on labetalol for high blood pressure.   Heart rate seems to be better.  Echocardiogram i showed normal systolic function.  No significant valvular abnormalities.  Small circumferential pericardial effusion was noted.  Abdominal pain with nausea Likely multifactorial.  Lipase level noted to be normal.  LFTs unremarkable.  No concerning findings on CT of the abdomen and pelvis.  Continue with PPI for now.  Nausea appears to be better.  Diabetes mellitus type 1, uncontrolled with hyperglycemia HbA1c is 6.5.  Continue glargine and SSI.  Monitor CBGs.  Seen by diabetes coordinator. At discharge they recommend Basaglar KwikPen's, Humalog KwikPen's, insulin pen needles and prescription for glucose monitoring kit.  Patient interested in Dexcom G7 sensors.  Normocytic anemia Drop in hemoglobin is likely dilutional.  No overt bleeding has been noted.  Anemia panel reviewed.  No deficiencies identified.  His renal failure is likely contributing.  Essential hypertension Blood pressures have been poorly controlled.  Patient was started on amlodipine and labetalol.  Improvement noted.  Continue to monitor for now.  Hydralazine as needed.   Will see his blood pressure trends when he is started on dialysis.  Visual impairment Started in November.  Apparently has been seen by retina specialist.  He is supposed to  follow-up for further interventions.  All of this is likely driven by his diabetes and hypertension.  Tobacco abuse Counseled.  DVT Prophylaxis: Subcutaneous heparin Code Status: Full code Family  Communication: Discussed with patient and his girlfriend with his permission Disposition Plan: To be determined  Status is: Inpatient Remains inpatient appropriate because: Acute kidney injury      Medications: Scheduled:  [MAR Hold] amLODipine  10 mg Oral Daily   chlorhexidine  15 mL Mouth/Throat Once   Or   mouth rinse  15 mL Mouth Rinse Once   [MAR Hold] darbepoetin (ARANESP) injection - NON-DIALYSIS  60 mcg Subcutaneous Q Sat-1800   [MAR Hold] heparin  5,000 Units Subcutaneous Q8H   [MAR Hold] insulin aspart  0-5 Units Subcutaneous QHS   [MAR Hold] insulin aspart  0-9 Units Subcutaneous TID WC   [MAR Hold] insulin glargine-yfgn  12 Units Subcutaneous Daily   [MAR Hold] labetalol  100 mg Oral BID   [MAR Hold] pantoprazole  40 mg Oral BID   Continuous:  [MAR Hold] ampicillin-sulbactam (UNASYN) IV 3 g (10/24/22 2157)   lactated ringers     sodium bicarbonate 75 mEq in sodium chloride 0.45 % 1,075 mL infusion 100 mL/hr at 10/24/22 2257   PRN:[MAR Hold] acetaminophen **OR** [MAR Hold] acetaminophen, [MAR Hold] hydrALAZINE, insulin aspart, [MAR Hold] methocarbamol, [MAR Hold] nicotine polacrilex, [MAR Hold] ondansetron **OR** [MAR Hold] ondansetron (ZOFRAN) IV, [MAR Hold] oxyCODONE  Antibiotics: Anti-infectives (From admission, onward)    Start     Dose/Rate Route Frequency Ordered Stop   10/21/22 1145  [MAR Hold]  Ampicillin-Sulbactam (UNASYN) 3 g in sodium chloride 0.9 % 100 mL IVPB        (MAR Hold since Tue 10/25/2022 at 0806.Hold Reason: Transfer to a Procedural area)   3 g 200 mL/hr over 30 Minutes Intravenous Every 12 hours 10/21/22 1058 10/26/22 0959       Objective:  Vital Signs  Vitals:   10/24/22 2035 10/24/22 2355 10/25/22 0409 10/25/22 0814  BP: (!) 165/111 (!) 135/100 (!) 141/93 (!) 156/106  Pulse: (!) 104 100 100 (!) 104  Resp: '12 16 15 16  '$ Temp: 97.9 F (36.6 C) 98.1 F (36.7 C) 98.2 F (36.8 C) (!) 101.7 F (38.7 C)  TempSrc: Oral Oral Oral Oral   SpO2: 90% 93% 92% 90%  Weight:   92.4 kg 86.2 kg  Height:    '5\' 10"'$  (1.778 m)    Intake/Output Summary (Last 24 hours) at 10/25/2022 0834 Last data filed at 10/24/2022 2200 Gross per 24 hour  Intake --  Output 600 ml  Net -600 ml    Filed Weights   10/24/22 0500 10/25/22 0409 10/25/22 0814  Weight: 87 kg 92.4 kg 86.2 kg    General appearance: Awake alert.  In no distress Resp: Clear to auscultation bilaterally.  Normal effort Cardio: S1-S2 is normal regular.  No S3-S4.  No rubs murmurs or bruit GI: Abdomen is soft.  Nontender nondistended.  Bowel sounds are present normal.  No masses organomegaly Extremities: Noted to have lower extremity edema.  Full range of motion of lower extremities. Neurologic: Alert and oriented x3.  No focal neurological deficits.     Lab Results:  Data Reviewed: I have personally reviewed following labs and reports of the imaging studies  CBC: Recent Labs  Lab 10/20/22 1238 10/21/22 0753 10/22/22 0606 10/23/22 0619 10/24/22 0307 10/25/22 0710  WBC 12.0* 16.4* 13.5* 10.9* 10.0 12.1*  NEUTROABS 9.0* 13.2*  --   --   --   --  HGB 9.6* 8.9* 7.9* 7.8* 8.0* 8.6*  HCT 27.2* 25.4* 24.0* 23.7* 23.0* 24.1*  MCV 83.7 86.4 89.2 89.1 86.1 86.1  PLT 373 323 321 334 303 314     Basic Metabolic Panel: Recent Labs  Lab 10/21/22 0753 10/22/22 0606 10/23/22 0619 10/24/22 0307 10/25/22 0710  NA 135 135 136 134* 132*  K 3.8 4.0 3.9 3.7 4.0  CL 106 105 106 102 100  CO2 20* 18* 21* 21* 20*  GLUCOSE 253* 190* 164* 175* 288*  BUN 57* 53* 53* 52* 53*  CREATININE 6.56* 6.73* 7.01* 7.29* 7.76*  CALCIUM 7.8* 8.1* 8.0* 7.8* 7.8*  MG 2.1  --   --   --   --   PHOS  --  5.0*  --   --   --      GFR: Estimated Creatinine Clearance: 14.5 mL/min (A) (by C-G formula based on SCr of 7.76 mg/dL (H)).  Liver Function Tests: Recent Labs  Lab 10/20/22 1238 10/21/22 0753 10/22/22 0606  AST 15 11*  --   ALT 13 12  --   ALKPHOS 65 47  --   BILITOT 0.2* 0.5   --   PROT 5.2* 4.4*  --   ALBUMIN 3.3* 2.2* 2.9*     Recent Labs  Lab 10/20/22 1238  LIPASE 31     CBG: Recent Labs  Lab 10/24/22 0816 10/24/22 1242 10/24/22 1535 10/24/22 2148 10/25/22 0756  GLUCAP 267* 183* 148* 312* 279*     Lipid Profile: No results for input(s): "CHOL", "HDL", "LDLCALC", "TRIG", "CHOLHDL", "LDLDIRECT" in the last 72 hours.    Recent Results (from the past 240 hour(s))  Resp panel by RT-PCR (RSV, Flu A&B, Covid) Anterior Nasal Swab     Status: None   Collection Time: 10/20/22  3:22 PM   Specimen: Anterior Nasal Swab  Result Value Ref Range Status   SARS Coronavirus 2 by RT PCR NEGATIVE NEGATIVE Final    Comment: (NOTE) SARS-CoV-2 target nucleic acids are NOT DETECTED.  The SARS-CoV-2 RNA is generally detectable in upper respiratory specimens during the acute phase of infection. The lowest concentration of SARS-CoV-2 viral copies this assay can detect is 138 copies/mL. A negative result does not preclude SARS-Cov-2 infection and should not be used as the sole basis for treatment or other patient management decisions. A negative result may occur with  improper specimen collection/handling, submission of specimen other than nasopharyngeal swab, presence of viral mutation(s) within the areas targeted by this assay, and inadequate number of viral copies(<138 copies/mL). A negative result must be combined with clinical observations, patient history, and epidemiological information. The expected result is Negative.  Fact Sheet for Patients:  EntrepreneurPulse.com.au  Fact Sheet for Healthcare Providers:  IncredibleEmployment.be  This test is no t yet approved or cleared by the [place] FDA and  has been authorized for detection and/or diagnosis of SARS-CoV-2 by FDA under an Emergency Use Authorization (EUA). This EUA will remain  in effect (meaning this test can be used) for the duration of  the COVID-19 declaration under Section 564(b)(1) of the Act, 21 U.S.C.section 360bbb-3(b)(1), unless the authorization is terminated  or revoked sooner.       Influenza A by PCR NEGATIVE NEGATIVE Final   Influenza B by PCR NEGATIVE NEGATIVE Final    Comment: (NOTE) The Xpert Xpress SARS-CoV-2/FLU/RSV plus assay is intended as an aid in the diagnosis of influenza from Nasopharyngeal swab specimens and should not be used as a sole basis for treatment.  Nasal washings and aspirates are unacceptable for Xpert Xpress SARS-CoV-2/FLU/RSV testing.  Fact Sheet for Patients: EntrepreneurPulse.com.au  Fact Sheet for Healthcare Providers: IncredibleEmployment.be  This test is not yet approved or cleared by the [place] FDA and has been authorized for detection and/or diagnosis of SARS-CoV-2 by FDA under an Emergency Use Authorization (EUA). This EUA will remain in effect (meaning this test can be used) for the duration of the COVID-19 declaration under Section 564(b)(1) of the Act, 21 U.S.C. section 360bbb-3(b)(1), unless the authorization is terminated or revoked.     Resp Syncytial Virus by PCR NEGATIVE NEGATIVE Final    Comment: (NOTE) Fact Sheet for Patients: EntrepreneurPulse.com.au  Fact Sheet for Healthcare Providers: IncredibleEmployment.be  This test is not yet approved or cleared by the [place] FDA and has been authorized for detection and/or diagnosis of SARS-CoV-2 by FDA under an Emergency Use Authorization (EUA). This EUA will remain in effect (meaning this test can be used) for the duration of the COVID-19 declaration under Section 564(b)(1) of the Act, 21 U.S.C. section 360bbb-3(b)(1), unless the authorization is terminated or revoked.  Performed at [facility], [address] [place], [place] [address]       Radiology Studies: VAS [place] UPPER EXT VEIN MAPPING (PRE-OP  AVF)  Result Date: 10/24/2022 UPPER EXTREMITY VEIN MAPPING Patient Name:  John [name]  Date of Exam:   10/24/2022 Medical Rec #: [id number]        Accession #:    [id number] Date of Birth: [birth year]        Patient Gender: M Patient Age:   77 years Exam Location:  [facility] Procedure:      VAS [place] UPPER EXT VEIN MAPPING (PRE-OP AVF) Referring Phys: Aldona Bar RHYNE --------------------------------------------------------------------------------  Indications: Pre-op possible AVF placement, acute kidney failure. Limitations: Bandage and IV placement left AC fossa area. Comparison Study: No prior study. Performing Technologist: McKayla Maag RVT, [place]  Examination Guidelines: A complete evaluation includes B-mode imaging, spectral Doppler, color Doppler, and power Doppler as needed of all accessible portions of each vessel. Bilateral testing is considered an integral part of a complete examination. Limited examinations for reoccurring indications may be performed as noted. +-----------------+-------------+----------+---------+ Right Cephalic   Diameter (cm)Depth (cm)Findings  +-----------------+-------------+----------+---------+ Shoulder             0.32        0.58             +-----------------+-------------+----------+---------+ Prox upper arm       0.44        0.47             +-----------------+-------------+----------+---------+ Mid upper arm        0.37        0.28             +-----------------+-------------+----------+---------+ Dist upper arm       0.38        0.36             +-----------------+-------------+----------+---------+ Antecubital fossa    0.30        0.23   branching +-----------------+-------------+----------+---------+ Prox forearm         0.26        0.37             +-----------------+-------------+----------+---------+ Mid forearm          0.17        0.38   branching +-----------------+-------------+----------+---------+ Dist forearm  0.32        0.27             +-----------------+-------------+----------+---------+ Wrist                0.28        0.23             +-----------------+-------------+----------+---------+ +-----------------+-------------+----------+---------+ Right Basilic    Diameter (cm)Depth (cm)Findings  +-----------------+-------------+----------+---------+ Prox upper arm       0.71        0.78             +-----------------+-------------+----------+---------+ Mid upper arm        0.42        0.81             +-----------------+-------------+----------+---------+ Dist upper arm       0.48        0.76             +-----------------+-------------+----------+---------+ Antecubital fossa    0.26        0.38             +-----------------+-------------+----------+---------+ Prox forearm         0.33        0.37             +-----------------+-------------+----------+---------+ Mid forearm          0.28        0.29   branching +-----------------+-------------+----------+---------+ Distal forearm       0.26        0.32             +-----------------+-------------+----------+---------+ Wrist                0.17        0.51             +-----------------+-------------+----------+---------+ +-----------------+-------------+----------+-----------------------------------+ Left Cephalic    Diameter (cm)Depth (cm)             Findings               +-----------------+-------------+----------+-----------------------------------+ Shoulder             0.43        0.87                                       +-----------------+-------------+----------+-----------------------------------+ Prox upper arm       0.30        0.47                                       +-----------------+-------------+----------+-----------------------------------+ Mid upper arm        0.27        0.23                                        +-----------------+-------------+----------+-----------------------------------+ Dist upper arm       0.34        0.20                                       +-----------------+-------------+----------+-----------------------------------+ Antecubital fossa  Not visualized due to bandage and                                                     IV placement             +-----------------+-------------+----------+-----------------------------------+ Prox forearm                             Not visualized due to bandage and                                                     IV placement             +-----------------+-------------+----------+-----------------------------------+ Mid forearm          0.32        0.41                                       +-----------------+-------------+----------+-----------------------------------+ Dist forearm         0.36        0.24                                       +-----------------+-------------+----------+-----------------------------------+ Wrist                0.31        0.54                                       +-----------------+-------------+----------+-----------------------------------+ +-----------------+-------------+----------+---------+ Left Basilic     Diameter (cm)Depth (cm)Findings  +-----------------+-------------+----------+---------+ Prox upper arm       0.48        0.93             +-----------------+-------------+----------+---------+ Mid upper arm        0.48        1.34             +-----------------+-------------+----------+---------+ Dist upper arm       0.32        0.88   branching +-----------------+-------------+----------+---------+ Antecubital fossa    0.43        0.46             +-----------------+-------------+----------+---------+ Prox forearm         0.31        0.61             +-----------------+-------------+----------+---------+ Mid forearm           0.35        0.53             +-----------------+-------------+----------+---------+ Distal forearm       0.29        0.51             +-----------------+-------------+----------+---------+ Wrist                0.12  0.62             +-----------------+-------------+----------+---------+ *See table(s) above for measurements and observations.  Diagnosing physician: Servando Snare MD Electronically signed by Servando Snare MD on 10/24/2022 at 7:27:26 PM.    Final    ECHOCARDIOGRAM COMPLETE  Result Date: 10/24/2022    ECHOCARDIOGRAM REPORT   Patient Name:   John [name] Date of Exam: 10/24/2022 Medical Rec #:  [id number]       Height:       70.0 in Accession #:    [id number]      Weight:       191.8 lb Date of Birth:  [birth year]       BSA:          2.051 m Patient Age:    45 years        BP:           137/88 mmHg Patient Gender: M               HR:           99 bpm. Exam Location:  Inpatient Procedure: 2D Echo Indications:    dyspnea  History:        Patient has no prior history of Echocardiogram examinations.                 Risk Factors:Current Smoker and Hypertension.  Sonographer:    Harvie Junior Referring Phys: Bonnielee Haff IMPRESSIONS  1. Left ventricular ejection fraction, by estimation, is 55%. The left ventricle has low normal function. The left ventricle has no regional wall motion abnormalities. There is mild concentric left ventricular hypertrophy. Left ventricular diastolic parameters were normal.  2. Right ventricular systolic function is normal. The right ventricular size is normal. There is normal pulmonary artery systolic pressure.  3. Left atrial size was mildly dilated.  4. A small pericardial effusion is present. The pericardial effusion is circumferential.  5. Mild mitral valve regurgitation.  6. The aortic valve is tricuspid. Aortic valve regurgitation is not visualized.  7. The inferior vena cava is normal in size with greater than 50% respiratory variability, suggesting right  atrial pressure of 3 mmHg. FINDINGS  Left Ventricle: Left ventricular ejection fraction, by estimation, is 55%. The left ventricle has low normal function. The left ventricle has no regional wall motion abnormalities. The left ventricular internal cavity size was normal in size. There is mild concentric left ventricular hypertrophy. Left ventricular diastolic parameters were normal. Right Ventricle: The right ventricular size is normal. Right vetricular wall thickness was not assessed. Right ventricular systolic function is normal. There is normal pulmonary artery systolic pressure. The tricuspid regurgitant velocity is 1.90 m/s, and with an assumed right atrial pressure of 3 mmHg, the estimated right ventricular systolic pressure is 0000000 mmHg. Left Atrium: Left atrial size was mildly dilated. Right Atrium: Right atrial size was normal in size. Pericardium: A small pericardial effusion is present. The pericardial effusion is circumferential. Mitral Valve: There is mild thickening of the mitral valve leaflet(s). Mild mitral annular calcification. Mild mitral valve regurgitation. Tricuspid Valve: The tricuspid valve is normal in structure. Tricuspid valve regurgitation is trivial. Aortic Valve: The aortic valve is tricuspid. Aortic valve regurgitation is not visualized. Aortic valve mean gradient measures 4.0 mmHg. Aortic valve peak gradient measures 6.5 mmHg. Aortic valve area, by VTI measures 2.56 cm. Pulmonic Valve: The pulmonic valve was normal in structure. Pulmonic valve regurgitation is not visualized. Aorta: The aortic root and ascending aorta are  structurally normal, with no evidence of dilitation. Venous: The inferior vena cava is normal in size with greater than 50% respiratory variability, suggesting right atrial pressure of 3 mmHg. IAS/Shunts: No atrial level shunt detected by color flow Doppler.  LEFT VENTRICLE PLAX 2D LVIDd:         4.60 cm      Diastology LVIDs:         3.50 cm      LV e' medial:     7.62 cm/s LV PW:         1.20 cm      LV E/e' medial:  14.4 LV IVS:        1.20 cm      LV e' lateral:   11.90 cm/s LVOT diam:     2.00 cm      LV E/e' lateral: 9.2 LV SV:         53 LV SV Index:   26 LVOT Area:     3.14 cm                              3D Volume EF: LV Volumes (MOD)            3D EF:        45 % LV vol d, MOD A2C: 106.0 ml LV EDV:       169 ml LV vol d, MOD A4C: 120.0 ml LV ESV:       93 ml LV vol s, MOD A2C: 62.9 ml  LV SV:        76 ml LV vol s, MOD A4C: 56.3 ml LV SV MOD A2C:     43.1 ml LV SV MOD A4C:     120.0 ml LV SV MOD BP:      53.4 ml RIGHT VENTRICLE RV Basal diam:  2.90 cm RV Mid diam:    2.50 cm RV S prime:     16.30 cm/s TAPSE (M-mode): 2.3 cm LEFT ATRIUM             Index        RIGHT ATRIUM           Index LA diam:        3.70 cm 1.80 cm/m   RA Area:     10.90 cm LA Vol (A2C):   60.4 ml 29.46 ml/m  RA Volume:   21.00 ml  10.24 ml/m LA Vol (A4C):   69.4 ml 33.85 ml/m LA Biplane Vol: 65.5 ml 31.94 ml/m  AORTIC VALVE                    PULMONIC VALVE AV Area (Vmax):    2.50 cm     PV Vmax:       1.18 m/s AV Area (Vmean):   2.49 cm     PV Peak grad:  5.6 mmHg AV Area (VTI):     2.56 cm AV Vmax:           127.00 cm/s AV Vmean:          86.200 cm/s AV VTI:            0.209 m AV Peak Grad:      6.5 mmHg AV Mean Grad:      4.0 mmHg LVOT Vmax:         101.00 cm/s LVOT Vmean:        68.300 cm/s  LVOT VTI:          0.170 m LVOT/AV VTI ratio: 0.81  AORTA Ao Root diam: 3.10 cm Ao Asc diam:  2.90 cm MITRAL VALVE                TRICUSPID VALVE MV Area (PHT): 3.85 cm     TR Peak grad:   14.4 mmHg MV Decel Time: 197 msec     TR Vmax:        190.00 cm/s MR Peak grad: 27.0 mmHg MR Vmax:      260.00 cm/s   SHUNTS MV E velocity: 110.00 cm/s  Systemic VTI:  0.17 m MV A velocity: 85.90 cm/s   Systemic Diam: 2.00 cm MV E/A ratio:  1.28 Dorris Carnes MD Electronically signed by Dorris Carnes MD Signature Date/Time: 10/24/2022/2:43:49 PM    Final        LOS: 5 days   [facility] Pager on [email or web address]  10/25/2022, 8:34 AM

## 2022-10-25 NOTE — Progress Notes (Addendum)
[facility] NEPHROLOGY PROGRESS NOTE  Assessment/ Plan: Pt is a 32 y.o. yo male  with history of uncontrolled type 1 diabetes, CKD presented to the ER with worsening body pain associated with nausea and vomiting seen as a consultation for worsening renal failure.   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  He may have some component of AKI due to GI loss/decreased oral intake and NSAIDs use.  The last creatinine level was around 1.3 in 2021 and no results available to review from 2021-2024.  He does have uncontrolled diabetes and hypertension in between.  UA with chronic proteinuria.  CT scan ruled out hydronephrosis. Treating with IV fluid with mildly increased urine output however continue to worsen renal function.   - will need to start dialysis this admission  - VVS placed [facility] and AVF 10/25/22- appreciate assistance  - HD #1 today  - needs CLIP- will notify renal navigator   # Hypertension: Amlodipine increased to 10 mg and added labetalol.  Monitor blood pressure and heart rate.  Getting echo.    # Cough/possibly aspiration pneumonia on Unasyn.  Getting echo by primary team.   # Anemia of CKD: Iron saturation 41%, serum iron 76.  Treated with a dose of IV iron and Aranesp on 3/2.      # CKD-MBD: Pending PTH level.monitor calcium and phosphorus level.    Discussed with the primary team.  Subjective: Seen and examined at bedside.  S/p [facility] and AVF this AM with Dr Donzetta Matters.  For HD #1 today.  Pt's SO at bedside, all questions answered.  Objective Vital signs in last 24 hours: Vitals:   10/25/22 1110 10/25/22 1125 10/25/22 1130 10/25/22 1149  BP: (!) 160/95 (!) 158/97 (!) 150/92 (!) 150/97  Pulse: 98 95 95 96  Resp: '10 12 11 16  '$ Temp:   97.7 F (36.5 C) (!) 97.4 F (36.3 C)  TempSrc:    Oral  SpO2: 96% 93% 94% 92%  Weight:      Height:       Weight change: 5.4 kg  Intake/Output Summary  (Last 24 hours) at 10/25/2022 1341 Last data filed at 10/25/2022 0919 Gross per 24 hour  Intake 100 ml  Output 600 ml  Net -500 ml       Labs: RENAL PANEL Recent Labs  Lab 10/20/22 1238 10/21/22 0753 10/22/22 0606 10/23/22 0619 10/24/22 0307 10/25/22 0710  NA 136 135 135 136 134* 132*  K 3.9 3.8 4.0 3.9 3.7 4.0  CL 100 106 105 106 102 100  CO2 24 20* 18* 21* 21* 20*  GLUCOSE 331* 253* 190* 164* 175* 288*  BUN 64* 57* 53* 53* 52* 53*  CREATININE 6.69* 6.56* 6.73* 7.01* 7.29* 7.76*  CALCIUM 8.8* 7.8* 8.1* 8.0* 7.8* 7.8*  MG  --  2.1  --   --   --  2.1  PHOS  --   --  5.0*  --   --  5.9*  ALBUMIN 3.3* 2.2* 2.9*  --   --   --     Liver Function Tests: Recent Labs  Lab 10/20/22 1238 10/21/22 0753 10/22/22 0606  AST 15 11*  --   ALT 13 12  --   ALKPHOS 65 47  --   BILITOT 0.2* 0.5  --   PROT 5.2* 4.4*  --   ALBUMIN 3.3* 2.2* 2.9*   Recent Labs  Lab 10/20/22 1238  LIPASE 31  No results for input(s): "AMMONIA" in the last 168 hours. CBC: Recent Labs    10/21/22 0753 10/22/22 0606 10/23/22 0619 10/24/22 0307 10/25/22 0710  HGB 8.9* 7.9* 7.8* 8.0* 8.6*  MCV 86.4 89.2 89.1 86.1 86.1  VITAMINB12  --  659  --   --   --   FOLATE  --  8.0  --   --   --   FERRITIN  --  204  --   --   --   TIBC  --  188*  --   --   --   IRON  --  76  --   --   --   RETICCTPCT  --  2.9  --   --   --     Cardiac Enzymes: Recent Labs  Lab 10/21/22 0752  CKTOTAL 57   CBG: Recent Labs  Lab 10/24/22 2148 10/25/22 0756 10/25/22 0818 10/25/22 1054 10/25/22 1148  GLUCAP 312* 279* 303* 277* 277*    Iron Studies:  No results for input(s): "IRON", "TIBC", "TRANSFERRIN", "FERRITIN" in the last 72 hours.  Studies/Results: DG C-Arm 1-60 Min-No Report  Result Date: 10/25/2022 Fluoroscopy was utilized by the requesting physician.  No radiographic interpretation.   VAS [place] UPPER EXT VEIN MAPPING (PRE-OP AVF)  Result Date: 10/24/2022 UPPER EXTREMITY VEIN MAPPING Patient Name:   John [name]  Date of Exam:   10/24/2022 Medical Rec #: [id number]        Accession #:    [id number] Date of Birth: 03/20/1991        Patient Gender: M Patient Age:   50 years Exam Location[facility]  [facility] Procedure:      VAS [place] UPPER EXT VEIN MAPPING (PRE-OP AVF) Referring Phys: Aldona Bar RHYNE --------------------------------------------------------------------------------  Indications: Pre-op possible AVF placement, acute kidney failure. Limitations: Bandage and IV placement left AC fossa area. Comparison Study: No prior study. Performing Technologist: McKayla Maag RVT, [place]  Examination Guidelines: A complete evaluation includes B-mode imaging, spectral Doppler, color Doppler, and power Doppler as needed of all accessible portions of each vessel. Bilateral testing is considered an integral part of a complete examination. Limited examinations for reoccurring indications may be performed as noted. +-----------------+-------------+----------+---------+ Right Cephalic   Diameter (cm)Depth (cm)Findings  +-----------------+-------------+----------+---------+ Shoulder             0.32        0.58             +-----------------+-------------+----------+---------+ Prox upper arm       0.44        0.47             +-----------------+-------------+----------+---------+ Mid upper arm        0.37        0.28             +-----------------+-------------+----------+---------+ Dist upper arm       0.38        0.36             +-----------------+-------------+----------+---------+ Antecubital fossa    0.30        0.23   branching +-----------------+-------------+----------+---------+ Prox forearm         0.26        0.37             +-----------------+-------------+----------+---------+ Mid forearm          0.17        0.38   branching +-----------------+-------------+----------+---------+ Dist forearm  0.32        0.27              +-----------------+-------------+----------+---------+ Wrist                0.28        0.23             +-----------------+-------------+----------+---------+ +-----------------+-------------+----------+---------+ Right Basilic    Diameter (cm)Depth (cm)Findings  +-----------------+-------------+----------+---------+ Prox upper arm       0.71        0.78             +-----------------+-------------+----------+---------+ Mid upper arm        0.42        0.81             +-----------------+-------------+----------+---------+ Dist upper arm       0.48        0.76             +-----------------+-------------+----------+---------+ Antecubital fossa    0.26        0.38             +-----------------+-------------+----------+---------+ Prox forearm         0.33        0.37             +-----------------+-------------+----------+---------+ Mid forearm          0.28        0.29   branching +-----------------+-------------+----------+---------+ Distal forearm       0.26        0.32             +-----------------+-------------+----------+---------+ Wrist                0.17        0.51             +-----------------+-------------+----------+---------+ +-----------------+-------------+----------+-----------------------------------+ Left Cephalic    Diameter (cm)Depth (cm)             Findings               +-----------------+-------------+----------+-----------------------------------+ Shoulder             0.43        0.87                                       +-----------------+-------------+----------+-----------------------------------+ Prox upper arm       0.30        0.47                                       +-----------------+-------------+----------+-----------------------------------+ Mid upper arm        0.27        0.23                                       +-----------------+-------------+----------+-----------------------------------+ Dist  upper arm       0.34        0.20                                       +-----------------+-------------+----------+-----------------------------------+ Antecubital fossa  Not visualized due to bandage and                                                     IV placement             +-----------------+-------------+----------+-----------------------------------+ Prox forearm                             Not visualized due to bandage and                                                     IV placement             +-----------------+-------------+----------+-----------------------------------+ Mid forearm          0.32        0.41                                       +-----------------+-------------+----------+-----------------------------------+ Dist forearm         0.36        0.24                                       +-----------------+-------------+----------+-----------------------------------+ Wrist                0.31        0.54                                       +-----------------+-------------+----------+-----------------------------------+ +-----------------+-------------+----------+---------+ Left Basilic     Diameter (cm)Depth (cm)Findings  +-----------------+-------------+----------+---------+ Prox upper arm       0.48        0.93             +-----------------+-------------+----------+---------+ Mid upper arm        0.48        1.34             +-----------------+-------------+----------+---------+ Dist upper arm       0.32        0.88   branching +-----------------+-------------+----------+---------+ Antecubital fossa    0.43        0.46             +-----------------+-------------+----------+---------+ Prox forearm         0.31        0.61             +-----------------+-------------+----------+---------+ Mid forearm          0.35        0.53              +-----------------+-------------+----------+---------+ Distal forearm       0.29        0.51             +-----------------+-------------+----------+---------+ Wrist                0.12  0.62             +-----------------+-------------+----------+---------+ *See table(s) above for measurements and observations.  Diagnosing physician: Servando Snare MD Electronically signed by Servando Snare MD on 10/24/2022 at 7:27:26 PM.    Final    ECHOCARDIOGRAM COMPLETE  Result Date: 10/24/2022    ECHOCARDIOGRAM REPORT   Patient Name:   John [name] Date of Exam: 10/24/2022 Medical Rec #:  [id number]       Height:       70.0 in Accession #:    [id number]      Weight:       191.8 lb Date of Birth:  1991/07/29       BSA:          2.051 m Patient Age:    66 years        BP:           137/88 mmHg Patient Gender: M               HR:           99 bpm. Exam Location:  Inpatient Procedure: 2D Echo Indications:    dyspnea  History:        Patient has no prior history of Echocardiogram examinations.                 Risk Factors:Current Smoker and Hypertension.  Sonographer:    Harvie Junior Referring Phys: Bonnielee Haff IMPRESSIONS  1. Left ventricular ejection fraction, by estimation, is 55%. The left ventricle has low normal function. The left ventricle has no regional wall motion abnormalities. There is mild concentric left ventricular hypertrophy. Left ventricular diastolic parameters were normal.  2. Right ventricular systolic function is normal. The right ventricular size is normal. There is normal pulmonary artery systolic pressure.  3. Left atrial size was mildly dilated.  4. A small pericardial effusion is present. The pericardial effusion is circumferential.  5. Mild mitral valve regurgitation.  6. The aortic valve is tricuspid. Aortic valve regurgitation is not visualized.  7. The inferior vena cava is normal in size with greater than 50% respiratory variability, suggesting right atrial pressure of 3 mmHg.  FINDINGS  Left Ventricle: Left ventricular ejection fraction, by estimation, is 55%. The left ventricle has low normal function. The left ventricle has no regional wall motion abnormalities. The left ventricular internal cavity size was normal in size. There is mild concentric left ventricular hypertrophy. Left ventricular diastolic parameters were normal. Right Ventricle: The right ventricular size is normal. Right vetricular wall thickness was not assessed. Right ventricular systolic function is normal. There is normal pulmonary artery systolic pressure. The tricuspid regurgitant velocity is 1.90 m/s, and with an assumed right atrial pressure of 3 mmHg, the estimated right ventricular systolic pressure is 0000000 mmHg. Left Atrium: Left atrial size was mildly dilated. Right Atrium: Right atrial size was normal in size. Pericardium: A small pericardial effusion is present. The pericardial effusion is circumferential. Mitral Valve: There is mild thickening of the mitral valve leaflet(s). Mild mitral annular calcification. Mild mitral valve regurgitation. Tricuspid Valve: The tricuspid valve is normal in structure. Tricuspid valve regurgitation is trivial. Aortic Valve: The aortic valve is tricuspid. Aortic valve regurgitation is not visualized. Aortic valve mean gradient measures 4.0 mmHg. Aortic valve peak gradient measures 6.5 mmHg. Aortic valve area, by VTI measures 2.56 cm. Pulmonic Valve: The pulmonic valve was normal in structure. Pulmonic valve regurgitation is not visualized. Aorta: The aortic root and ascending aorta are  structurally normal, with no evidence of dilitation. Venous: The inferior vena cava is normal in size with greater than 50% respiratory variability, suggesting right atrial pressure of 3 mmHg. IAS/Shunts: No atrial level shunt detected by color flow Doppler.  LEFT VENTRICLE PLAX 2D LVIDd:         4.60 cm      Diastology LVIDs:         3.50 cm      LV e' medial:    7.62 cm/s LV PW:          1.20 cm      LV E/e' medial:  14.4 LV IVS:        1.20 cm      LV e' lateral:   11.90 cm/s LVOT diam:     2.00 cm      LV E/e' lateral: 9.2 LV SV:         53 LV SV Index:   26 LVOT Area:     3.14 cm                              3D Volume EF: LV Volumes (MOD)            3D EF:        45 % LV vol d, MOD A2C: 106.0 ml LV EDV:       169 ml LV vol d, MOD A4C: 120.0 ml LV ESV:       93 ml LV vol s, MOD A2C: 62.9 ml  LV SV:        76 ml LV vol s, MOD A4C: 56.3 ml LV SV MOD A2C:     43.1 ml LV SV MOD A4C:     120.0 ml LV SV MOD BP:      53.4 ml RIGHT VENTRICLE RV Basal diam:  2.90 cm RV Mid diam:    2.50 cm RV S prime:     16.30 cm/s TAPSE (M-mode): 2.3 cm LEFT ATRIUM             Index        RIGHT ATRIUM           Index LA diam:        3.70 cm 1.80 cm/m   RA Area:     10.90 cm LA Vol (A2C):   60.4 ml 29.46 ml/m  RA Volume:   21.00 ml  10.24 ml/m LA Vol (A4C):   69.4 ml 33.85 ml/m LA Biplane Vol: 65.5 ml 31.94 ml/m  AORTIC VALVE                    PULMONIC VALVE AV Area (Vmax):    2.50 cm     PV Vmax:       1.18 m/s AV Area (Vmean):   2.49 cm     PV Peak grad:  5.6 mmHg AV Area (VTI):     2.56 cm AV Vmax:           127.00 cm/s AV Vmean:          86.200 cm/s AV VTI:            0.209 m AV Peak Grad:      6.5 mmHg AV Mean Grad:      4.0 mmHg LVOT Vmax:         101.00 cm/s LVOT Vmean:        68.300 cm/s  LVOT VTI:          0.170 m LVOT/AV VTI ratio: 0.81  AORTA Ao Root diam: 3.10 cm Ao Asc diam:  2.90 cm MITRAL VALVE                TRICUSPID VALVE MV Area (PHT): 3.85 cm     TR Peak grad:   14.4 mmHg MV Decel Time: 197 msec     TR Vmax:        190.00 cm/s MR Peak grad: 27.0 mmHg MR Vmax:      260.00 cm/s   SHUNTS MV E velocity: 110.00 cm/s  Systemic VTI:  0.17 m MV A velocity: 85.90 cm/s   Systemic Diam: 2.00 cm MV E/A ratio:  1.28 Dorris Carnes MD Electronically signed by Dorris Carnes MD Signature Date/Time: 10/24/2022/2:43:49 PM    Final     Medications: Infusions:  ampicillin-sulbactam (UNASYN) IV 3 g (10/25/22 1237)     Scheduled Medications:  amLODipine  10 mg Oral Daily   Chlorhexidine Gluconate Cloth  6 each Topical Q0600   darbepoetin (ARANESP) injection - NON-DIALYSIS  60 mcg Subcutaneous Q Sat-1800   fentaNYL       heparin  5,000 Units Subcutaneous Q8H   insulin aspart  0-5 Units Subcutaneous QHS   insulin aspart  0-9 Units Subcutaneous TID WC   insulin glargine-yfgn  12 Units Subcutaneous Daily   labetalol  100 mg Oral BID   pantoprazole  40 mg Oral BID    have reviewed scheduled and prn medications.  Physical Exam: General:NAD, comfortable Heart:RRR, s1s2 nl Lungs: Clear b/l,  Abdomen:soft, Non-tender, non-distended Extremities:No edema Neurology: Alert, awake and following commands. Sleepy but arousable  Issak Goley 10/25/2022,1:41 PM  LOS: 5 days

## 2022-10-25 NOTE — Progress Notes (Signed)
    Patient doing well recovering from tunneled dialysis catheter and left arm brachial artery to cephalic vein fistula creation.  I have ordered an x-ray of his chest to rule out pneumothorax and evaluate catheter positioning.  Okay to use catheter in the interim.  Plan will be to follow-up in the office and 4 to 6 weeks with left arm dialysis duplex to evaluate for fistula maturation.  Servando Snare

## 2022-10-25 NOTE — Anesthesia Procedure Notes (Signed)
Procedure Name: LMA Insertion Date/Time: 10/25/2022 9:14 AM  Performed by: Glynda Jaeger, CRNAPre-anesthesia Checklist: Patient identified, Emergency Drugs available, Suction available, Timeout performed and Patient being monitored Patient Re-evaluated:Patient Re-evaluated prior to induction Oxygen Delivery Method: Circle system utilized Preoxygenation: Pre-oxygenation with 100% oxygen Induction Type: IV induction Ventilation: Mask ventilation without difficulty LMA: LMA inserted LMA Size: 5.0 Number of attempts: 1 Tube secured with: Tape Dental Injury: Teeth and Oropharynx as per pre-operative assessment

## 2022-10-25 NOTE — Transfer of Care (Signed)
Immediate Anesthesia Transfer of Care Note  Patient: John [name]  Procedure(s) Performed: INSERTION OF TUNNELED PALINDROME 14.5 FR X 19 CM DIALYSIS CATHETER (Right: Chest) CEPHALIC ARTERIOVENOUS (AV) FISTULA CREATION (Left: Arm Upper)  Patient Location: PACU  Anesthesia Type:General  Level of Consciousness: drowsy, patient cooperative, and responds to stimulation  Airway & Oxygen Therapy: Patient Spontanous Breathing and Patient connected to face mask oxygen  Post-op Assessment: Report given to RN and Post -op Vital signs reviewed and stable  Post vital signs: Reviewed and stable  Last Vitals:  Vitals Value Taken Time  BP 156/100 10/25/22 1056  Temp    Pulse 95 10/25/22 1057  Resp 11 10/25/22 1057  SpO2 91 % 10/25/22 1057  Vitals shown include unvalidated device data.  Last Pain:  Vitals:   10/25/22 0826  TempSrc:   PainSc: 8       Patients Stated Pain Goal: 0 ([id number])  Complications: No notable events documented.

## 2022-10-25 NOTE — Discharge Instructions (Signed)
[facility]  Discharge Instructions  AV Fistula or Graft Surgery for Dialysis Access  Please refer to the following instructions for your post-procedure care. Your surgeon or physician assistant will discuss any changes with you.  Activity  You may drive the day following your surgery, if you are comfortable and no longer taking prescription pain medication. Resume full activity as the soreness in your incision resolves.  Bathing/Showering  You may shower after you go home. Keep your incision dry for 48 hours. Do not soak in a bathtub, hot tub, or swim until the incision heals completely. You may not shower if you have a hemodialysis catheter.  Incision Care  Clean your incision with mild soap and water after 48 hours. Pat the area dry with a clean towel. You do not need a bandage unless otherwise instructed. Do not apply any ointments or creams to your incision. You may have skin glue on your incision. Do not peel it off. It will come off on its own in about one week. Your arm may swell a bit after surgery. To reduce swelling use pillows to elevate your arm so it is above your heart. Your doctor will tell you if you need to lightly wrap your arm with an ACE bandage.  Diet  Resume your normal diet. There are not special food restrictions following this procedure. In order to heal from your surgery, it is CRITICAL to get adequate nutrition. Your body requires vitamins, minerals, and protein. Vegetables are the best source of vitamins and minerals. Vegetables also provide the perfect balance of protein. Processed food has little nutritional value, so try to avoid this.  Medications  Resume taking all of your medications. If your incision is causing pain, you may take over-the counter pain relievers such as acetaminophen (Tylenol). If you were prescribed a stronger pain medication, please be aware these medications can cause nausea and constipation. Prevent  nausea by taking the medication with a snack or meal. Avoid constipation by drinking plenty of fluids and eating foods with high amount of fiber, such as fruits, vegetables, and grains.  Do not take Tylenol if you are taking prescription pain medications.  Follow up Your surgeon may want to see you in the office following your access surgery. If so, this will be arranged at the time of your surgery.  Please call us immediately for any of the following conditions:  Increased pain, redness, drainage (pus) from your incision site Fever of 101 degrees or higher Severe or worsening pain at your incision site Hand pain or numbness.  Reduce your risk of vascular disease:  Stop smoking. If you would like help, call [facility] at 1-800-QUIT-NOW 334-177-6887) or [facility] [place] your cholesterol Maintain a desired weight Control your diabetes Keep your blood pressure down  Dialysis  It will take several weeks to several months for your new dialysis access to be ready for use. Your surgeon will determine when it is okay to use it. Your nephrologist will continue to direct your dialysis. You can continue to use your Permcath until your new access is ready for use.   10/25/2022 John [name] [id number] 03-Jun-1991  Surgeon(s): Waynetta Sandy, MD  Procedure(s): INSERTION OF TUNNELED PALINDROME 14.5 FR X 19 CM DIALYSIS CATHETER CEPHALIC ARTERIOVENOUS (AV) FISTULA CREATION   May stick graft immediately   May stick graft on designated area only:   X Do not stick left AV Fistula for 12 weeks  If you have any questions, please call the office at [phone].

## 2022-10-26 ENCOUNTER — Encounter (HOSPITAL_COMMUNITY): Payer: Self-pay | Admitting: Vascular Surgery

## 2022-10-26 ENCOUNTER — Other Ambulatory Visit (HOSPITAL_COMMUNITY): Payer: Self-pay

## 2022-10-26 LAB — RENAL FUNCTION PANEL
Albumin: 2.3 g/dL — ABNORMAL LOW (ref 3.5–5.0)
Anion gap: 12 (ref 5–15)
BUN: 58 mg/dL — ABNORMAL HIGH (ref 6–20)
CO2: 21 mmol/L — ABNORMAL LOW (ref 22–32)
Calcium: 7.9 mg/dL — ABNORMAL LOW (ref 8.9–10.3)
Chloride: 99 mmol/L (ref 98–111)
Creatinine, Ser: 8.69 mg/dL — ABNORMAL HIGH (ref 0.61–1.24)
GFR, Estimated: 8 mL/min — ABNORMAL LOW (ref >60)
Glucose, Bld: 312 mg/dL — ABNORMAL HIGH (ref 70–99)
Phosphorus: 6.6 mg/dL — ABNORMAL HIGH (ref 2.5–4.6)
Potassium: 4.3 mmol/L (ref 3.5–5.1)
Sodium: 132 mmol/L — ABNORMAL LOW (ref 135–145)

## 2022-10-26 LAB — CBC
HCT: 25.9 % — ABNORMAL LOW (ref 39.0–52.0)
Hemoglobin: 8.9 g/dL — ABNORMAL LOW (ref 13.0–17.0)
MCH: 30.2 pg (ref 26.0–34.0)
MCHC: 34.4 g/dL (ref 30.0–36.0)
MCV: 87.8 fL (ref 80.0–100.0)
Platelets: 335 K/uL (ref 150–400)
RBC: 2.95 MIL/uL — ABNORMAL LOW (ref 4.22–5.81)
RDW: 13.2 % (ref 11.5–15.5)
WBC: 17.4 K/uL — ABNORMAL HIGH (ref 4.0–10.5)
nRBC: 0 % (ref 0.0–0.2)

## 2022-10-26 LAB — GLUCOSE, CAPILLARY
Glucose-Capillary: 310 mg/dL — ABNORMAL HIGH (ref 70–99)
Glucose-Capillary: 309 mg/dL — ABNORMAL HIGH (ref 70–99)
Glucose-Capillary: 325 mg/dL — ABNORMAL HIGH (ref 70–99)

## 2022-10-26 LAB — HEPATITIS B SURFACE ANTIBODY, QUANTITATIVE: Hep B S AB Quant (Post): <3.1 m[IU]/mL — ABNORMAL LOW (ref >9.9)

## 2022-10-26 MED ORDER — ANTICOAGULANT SODIUM CITRATE 4% (200MG/5ML) IV SOLN: 5.0000 mL | Status: DC | PRN

## 2022-10-26 MED ORDER — POLYETHYLENE GLYCOL 3350 17 G PO PACK
17.0000 g | PACK | Freq: Two times a day (BID) | ORAL | Status: DC
  Administered 2022-10-26 – 2022-10-27 (×3): 17 g via ORAL
  Filled 2022-10-26 (×4): qty 1

## 2022-10-26 MED ORDER — PENTAFLUOROPROP-TETRAFLUOROETH EX AERO: 1.0000 | INHALATION_SPRAY | CUTANEOUS | Status: DC | PRN

## 2022-10-26 MED ORDER — LIDOCAINE-PRILOCAINE 2.5-2.5 % EX CREA: 1.0000 | TOPICAL_CREAM | CUTANEOUS | Status: DC | PRN

## 2022-10-26 MED ORDER — HEPARIN SODIUM (PORCINE) 1000 UNIT/ML DIALYSIS
1000.0000 [IU] | INTRAMUSCULAR | Status: DC | PRN
  Filled 2022-10-26: qty 1

## 2022-10-26 MED ORDER — INSULIN GLARGINE-YFGN 100 UNIT/ML ~~LOC~~ SOLN
16.0000 [IU] | Freq: Every day | SUBCUTANEOUS | Status: DC
  Administered 2022-10-27: 16 [IU] via SUBCUTANEOUS
  Filled 2022-10-26: qty 0.16

## 2022-10-26 MED ORDER — LIDOCAINE HCL (PF) 1 % IJ SOLN: 5.0000 mL | INTRAMUSCULAR | Status: DC | PRN

## 2022-10-26 MED ORDER — SENNOSIDES-DOCUSATE SODIUM 8.6-50 MG PO TABS
1.0000 | ORAL_TABLET | Freq: Two times a day (BID) | ORAL | Status: DC
  Administered 2022-10-26 – 2022-10-29 (×5): 1 via ORAL
  Filled 2022-10-26 (×5): qty 1

## 2022-10-26 MED ORDER — INSULIN GLARGINE-YFGN 100 UNIT/ML ~~LOC~~ SOLN
4.0000 [IU] | Freq: Once | SUBCUTANEOUS | Status: AC
  Administered 2022-10-26: 4 [IU] via SUBCUTANEOUS
  Filled 2022-10-26: qty 0.04

## 2022-10-26 MED ORDER — INSULIN GLARGINE-YFGN 100 UNIT/ML ~~LOC~~ SOLN
12.0000 [IU] | Freq: Once | SUBCUTANEOUS | Status: AC
  Administered 2022-10-26: 12 [IU] via SUBCUTANEOUS
  Filled 2022-10-26: qty 0.12

## 2022-10-26 MED ORDER — ALTEPLASE 2 MG IJ SOLR: 2.0000 mg | Freq: Once | INTRAMUSCULAR | Status: DC | PRN

## 2022-10-26 MED ORDER — POLYETHYLENE GLYCOL 3350 17 G PO PACK: 17.0000 g | PACK | Freq: Every day | ORAL | Status: DC | PRN

## 2022-10-26 NOTE — Plan of Care (Signed)
Nutrition Education Note  RD consulted for diet education. Pt is new ESRD/HD. Provided "Nutrition for Dialysis" and "Taking Control of Phosphorus" handouts from the [employer] to patient and girlfriend at bedside.  Pt's girlfriend at bedside has been researching nutrition for new ESRD/HD. She explains that they are trying to get pt accepted to an outpatient HD program that is M-Tu-Th-F with a break on Wednesdays and on weekends. She states that if pt does well with this, he may be eligible for home HD.  Pt's girlfriend explains that pt has no set eating pattern at home and that PO intake depends largely on their schedule and plans for the day. Pt will often have 1 large meal daily with several snacks throughout the day. Pt eats out a lot ([place], [place]) and likes to snack on chips. Pt's girlfriend shares that they always have a garden and that she is interested in growing more fruits and vegetables at home.  Focused on adequate protein intake, fluid restriction, T1DM management, and decreasing sodium intake. Explained why diet restrictions are needed and provided lists of foods to limit/avoid that are high in sodium and added phosphorus. Provided specific recommendations on safer alternatives of these foods. Strongly encouraged compliance of this diet.   Discussed importance of protein intake at each meal and snack. Provided examples of how to maximize protein intake throughout the day. Discussed need for fluid restriction with dialysis, importance of minimizing weight gain between HD treatments, and renal-friendly beverage options.  Encouraged pt to discuss specific diet questions/concerns with RD at HD outpatient facility. Teach back method used.  Expect good compliance.  Current diet order is Renal with 1200 ml fluid restriction, patient is consuming approximately 100% of meals at this time. Labs and medications reviewed. No further nutrition interventions warranted at  this time. RD contact information provided. If additional nutrition issues arise, please re-consult RD.   Gustavus Bryant, MS, RD, LDN Inpatient Clinical Dietitian Please see [facility] for contact information.

## 2022-10-26 NOTE — Progress Notes (Signed)
[facility] NEPHROLOGY PROGRESS NOTE  Assessment/ Plan: Pt is a 32 y.o. yo John  with history of uncontrolled type 1 diabetes, CKD presented to the ER with worsening body pain associated with nausea and vomiting seen as a consultation for worsening renal failure.   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  He may have some component of AKI due to GI loss/decreased oral intake and NSAIDs use.  The last creatinine level was around 1.3 in 2021 and no results available to review from 2021-2024.  He does have uncontrolled diabetes and hypertension in between.  UA with chronic proteinuria.  CT scan ruled out hydronephrosis. Treating with IV fluid with mildly increased urine output however continue to worsen renal function.   - will need to start dialysis this admission  - VVS placed [facility] and AVF 10/25/22- appreciate assistance  - HD #1 today 10/26/22  - CLIP in process  - bicarb gtt stopped   # Hypertension: Amlodipine increased to 10 mg and added labetalol.  Monitor blood pressure and heart rate.  Getting echo.    # Cough/possibly aspiration pneumonia on Unasyn.  Getting echo by primary team.   # Anemia of CKD: Iron saturation 41%, serum iron 76.  Treated with a dose of IV iron and Aranesp on 3/2.      # CKD-MBD: Pending PTH level.monitor calcium and phosphorus level.    Discussed with the primary team.  Subjective: Seen on dialysis.  HD #1 today.  Doing better, more awake and interactive.  Walked by his room on 5W as well- SO not in there- she usually has questions.    Objective Vital signs in last 24 hours: Vitals:   10/26/22 1000 10/26/22 1030 10/26/22 1100 10/26/22 1130  BP: 129/88 (!) 143/87 (!) 150/97 (!) 153/90  Pulse: (!) 104     Resp: 11     Temp:      TempSrc:      SpO2: 96%     Weight:      Height:       Weight change: -6.217 kg  Intake/Output Summary (Last 24 hours) at 10/26/2022  1153 Last data filed at 10/25/2022 2100 Gross per 24 hour  Intake --  Output 400 ml  Net -400 ml       Labs: RENAL PANEL Recent Labs  Lab 10/20/22 1238 10/21/22 0753 10/22/22 0606 10/23/22 0619 10/24/22 0307 10/25/22 0710 10/26/22 0704  NA 136 135 135 136 134* 132* 132*  K 3.9 3.8 4.0 3.9 3.7 4.0 4.3  CL 100 106 105 106 102 100 99  CO2 24 20* 18* 21* 21* 20* 21*  GLUCOSE 331* 253* 190* 164* 175* 288* 312*  BUN 64* 57* 53* 53* 52* 53* 58*  CREATININE 6.69* 6.56* 6.73* 7.01* 7.29* 7.76* 8.69*  CALCIUM 8.8* 7.8* 8.1* 8.0* 7.8* 7.8* 7.9*  MG  --  2.1  --   --   --  2.1  --   PHOS  --   --  5.0*  --   --  5.9* 6.6*  ALBUMIN 3.3* 2.2* 2.9*  --   --   --  2.3*    Liver Function Tests: Recent Labs  Lab 10/20/22 1238 10/21/22 0753 10/22/22 0606 10/26/22 0704  AST 15 11*  --   --   ALT 13 12  --   --   ALKPHOS 65 47  --   --   BILITOT 0.2* 0.5  --   --  PROT 5.2* 4.4*  --   --   ALBUMIN 3.3* 2.2* 2.9* 2.3*   Recent Labs  Lab 10/20/22 1238  LIPASE 31   No results for input(s): "AMMONIA" in the last 168 hours. CBC: Recent Labs    10/22/22 0606 10/23/22 0619 10/24/22 0307 10/25/22 0710 10/26/22 0705  HGB 7.9* 7.8* 8.0* 8.6* 8.9*  MCV 89.2 89.1 86.1 86.1 87.8  VITAMINB12 659  --   --   --   --   FOLATE 8.0  --   --   --   --   FERRITIN 204  --   --   --   --   TIBC 188*  --   --   --   --   IRON 76  --   --   --   --   RETICCTPCT 2.9  --   --   --   --     Cardiac Enzymes: Recent Labs  Lab 10/21/22 0752  CKTOTAL 57   CBG: Recent Labs  Lab 10/25/22 1054 10/25/22 1148 10/25/22 1546 10/25/22 2125 10/26/22 0811  GLUCAP 277* 277* 238* 224* 310*    Iron Studies:  No results for input(s): "IRON", "TIBC", "TRANSFERRIN", "FERRITIN" in the last 72 hours.  Studies/Results: DG CHEST PORT 1 VIEW  Result Date: 10/25/2022 CLINICAL DATA:  Status post insertion of the dialysis catheter. EXAM: PORTABLE CHEST 1 VIEW COMPARISON:  Chest radiograph 10/20/2022.  FINDINGS: 1409 hours. Interval insertion of a right IJ approach dialysis catheter with tip projecting over the superior cavoatrial junction. Unchanged mild cardiomegaly. New moderate pulmonary edema with increasing small left and moderate right pleural effusions. Unchanged left basilar atelectasis. No pneumothorax. IMPRESSION: 1. Interval placement of a right IJ approach dialysis catheter with tip projecting over the superior cavoatrial junction. 2. New moderate pulmonary edema with increasing small left and moderate right pleural effusions. Electronically Signed   By: Emmit Alexanders M.D.   On: 10/25/2022 14:22   DG C-Arm 1-60 Min-No Report  Result Date: 10/25/2022 Fluoroscopy was utilized by the requesting physician.  No radiographic interpretation.   VAS [place] UPPER EXT VEIN MAPPING (PRE-OP AVF)  Result Date: 10/24/2022 UPPER EXTREMITY VEIN MAPPING Patient Name:  John [name]  Date of Exam:   10/24/2022 Medical Rec #: [id number]        Accession #:    [id number] Date of Birth: 06/18/1991        Patient Gender: M Patient Age:   68 years Exam Location:  [facility] Procedure:      VAS [place] UPPER EXT VEIN MAPPING (PRE-OP AVF) Referring Phys: Aldona Bar RHYNE --------------------------------------------------------------------------------  Indications: Pre-op possible AVF placement, acute kidney failure. Limitations: Bandage and IV placement left AC fossa area. Comparison Study: No prior study. Performing Technologist: McKayla Maag RVT, VT  Examination Guidelines: A complete evaluation includes B-mode imaging, spectral Doppler, color Doppler, and power Doppler as needed of all accessible portions of each vessel. Bilateral testing is considered an integral part of a complete examination. Limited examinations for reoccurring indications may be performed as noted. +-----------------+-------------+----------+---------+ Right Cephalic   Diameter (cm)Depth (cm)Findings   +-----------------+-------------+----------+---------+ Shoulder             0.32        0.58             +-----------------+-------------+----------+---------+ Prox upper arm       0.44        0.47             +-----------------+-------------+----------+---------+  Mid upper arm        0.37        0.28             +-----------------+-------------+----------+---------+ Dist upper arm       0.38        0.36             +-----------------+-------------+----------+---------+ Antecubital fossa    0.30        0.23   branching +-----------------+-------------+----------+---------+ Prox forearm         0.26        0.37             +-----------------+-------------+----------+---------+ Mid forearm          0.17        0.38   branching +-----------------+-------------+----------+---------+ Dist forearm         0.32        0.27             +-----------------+-------------+----------+---------+ Wrist                0.28        0.23             +-----------------+-------------+----------+---------+ +-----------------+-------------+----------+---------+ Right Basilic    Diameter (cm)Depth (cm)Findings  +-----------------+-------------+----------+---------+ Prox upper arm       0.71        0.78             +-----------------+-------------+----------+---------+ Mid upper arm        0.42        0.81             +-----------------+-------------+----------+---------+ Dist upper arm       0.48        0.76             +-----------------+-------------+----------+---------+ Antecubital fossa    0.26        0.38             +-----------------+-------------+----------+---------+ Prox forearm         0.33        0.37             +-----------------+-------------+----------+---------+ Mid forearm          0.28        0.29   branching +-----------------+-------------+----------+---------+ Distal forearm       0.26        0.32              +-----------------+-------------+----------+---------+ Wrist                0.17        0.51             +-----------------+-------------+----------+---------+ +-----------------+-------------+----------+-----------------------------------+ Left Cephalic    Diameter (cm)Depth (cm)             Findings               +-----------------+-------------+----------+-----------------------------------+ Shoulder             0.43        0.87                                       +-----------------+-------------+----------+-----------------------------------+ Prox upper arm       0.30        0.47                                       +-----------------+-------------+----------+-----------------------------------+  Mid upper arm        0.27        0.23                                       +-----------------+-------------+----------+-----------------------------------+ Dist upper arm       0.34        0.20                                       +-----------------+-------------+----------+-----------------------------------+ Antecubital fossa                        Not visualized due to bandage and                                                     IV placement             +-----------------+-------------+----------+-----------------------------------+ Prox forearm                             Not visualized due to bandage and                                                     IV placement             +-----------------+-------------+----------+-----------------------------------+ Mid forearm          0.32        0.41                                       +-----------------+-------------+----------+-----------------------------------+ Dist forearm         0.36        0.24                                       +-----------------+-------------+----------+-----------------------------------+ Wrist                0.31        0.54                                        +-----------------+-------------+----------+-----------------------------------+ +-----------------+-------------+----------+---------+ Left Basilic     Diameter (cm)Depth (cm)Findings  +-----------------+-------------+----------+---------+ Prox upper arm       0.48        0.93             +-----------------+-------------+----------+---------+ Mid upper arm        0.48        1.34             +-----------------+-------------+----------+---------+ Dist upper arm       0.32        0.88   branching +-----------------+-------------+----------+---------+ Antecubital fossa  0.43        0.46             +-----------------+-------------+----------+---------+ Prox forearm         0.31        0.61             +-----------------+-------------+----------+---------+ Mid forearm          0.35        0.53             +-----------------+-------------+----------+---------+ Distal forearm       0.29        0.51             +-----------------+-------------+----------+---------+ Wrist                0.12        0.62             +-----------------+-------------+----------+---------+ *See table(s) above for measurements and observations.  Diagnosing physician: Servando Snare MD Electronically signed by Servando Snare MD on 10/24/2022 at 7:27:26 PM.    Final    ECHOCARDIOGRAM COMPLETE  Result Date: 10/24/2022    ECHOCARDIOGRAM REPORT   Patient Name:   John [name] Date of Exam: 10/24/2022 Medical Rec #:  [id number]       Height:       70.0 in Accession #:    [id number]      Weight:       191.8 lb Date of Birth:  26-Jun-1991       BSA:          2.051 m Patient Age:    77 years        BP:           137/88 mmHg Patient Gender: M               HR:           99 bpm. Exam Location:  Inpatient Procedure: 2D Echo Indications:    dyspnea  History:        Patient has no prior history of Echocardiogram examinations.                 Risk Factors:Current Smoker and Hypertension.  Sonographer:    Harvie Junior  Referring Phys: Bonnielee Haff IMPRESSIONS  1. Left ventricular ejection fraction, by estimation, is 55%. The left ventricle has low normal function. The left ventricle has no regional wall motion abnormalities. There is mild concentric left ventricular hypertrophy. Left ventricular diastolic parameters were normal.  2. Right ventricular systolic function is normal. The right ventricular size is normal. There is normal pulmonary artery systolic pressure.  3. Left atrial size was mildly dilated.  4. A small pericardial effusion is present. The pericardial effusion is circumferential.  5. Mild mitral valve regurgitation.  6. The aortic valve is tricuspid. Aortic valve regurgitation is not visualized.  7. The inferior vena cava is normal in size with greater than 50% respiratory variability, suggesting right atrial pressure of 3 mmHg. FINDINGS  Left Ventricle: Left ventricular ejection fraction, by estimation, is 55%. The left ventricle has low normal function. The left ventricle has no regional wall motion abnormalities. The left ventricular internal cavity size was normal in size. There is mild concentric left ventricular hypertrophy. Left ventricular diastolic parameters were normal. Right Ventricle: The right ventricular size is normal. Right vetricular wall thickness was not assessed. Right ventricular systolic function is normal. There is normal pulmonary artery systolic pressure. The tricuspid regurgitant velocity is  1.90 m/s, and with an assumed right atrial pressure of 3 mmHg, the estimated right ventricular systolic pressure is 0000000 mmHg. Left Atrium: Left atrial size was mildly dilated. Right Atrium: Right atrial size was normal in size. Pericardium: A small pericardial effusion is present. The pericardial effusion is circumferential. Mitral Valve: There is mild thickening of the mitral valve leaflet(s). Mild mitral annular calcification. Mild mitral valve regurgitation. Tricuspid Valve: The tricuspid valve  is normal in structure. Tricuspid valve regurgitation is trivial. Aortic Valve: The aortic valve is tricuspid. Aortic valve regurgitation is not visualized. Aortic valve mean gradient measures 4.0 mmHg. Aortic valve peak gradient measures 6.5 mmHg. Aortic valve area, by VTI measures 2.56 cm. Pulmonic Valve: The pulmonic valve was normal in structure. Pulmonic valve regurgitation is not visualized. Aorta: The aortic root and ascending aorta are structurally normal, with no evidence of dilitation. Venous: The inferior vena cava is normal in size with greater than 50% respiratory variability, suggesting right atrial pressure of 3 mmHg. IAS/Shunts: No atrial level shunt detected by color flow Doppler.  LEFT VENTRICLE PLAX 2D LVIDd:         4.60 cm      Diastology LVIDs:         3.50 cm      LV e' medial:    7.62 cm/s LV PW:         1.20 cm      LV E/e' medial:  14.4 LV IVS:        1.20 cm      LV e' lateral:   11.90 cm/s LVOT diam:     2.00 cm      LV E/e' lateral: 9.2 LV SV:         53 LV SV Index:   26 LVOT Area:     3.14 cm                              3D Volume EF: LV Volumes (MOD)            3D EF:        45 % LV vol d, MOD A2C: 106.0 ml LV EDV:       169 ml LV vol d, MOD A4C: 120.0 ml LV ESV:       93 ml LV vol s, MOD A2C: 62.9 ml  LV SV:        76 ml LV vol s, MOD A4C: 56.3 ml LV SV MOD A2C:     43.1 ml LV SV MOD A4C:     120.0 ml LV SV MOD BP:      53.4 ml RIGHT VENTRICLE RV Basal diam:  2.90 cm RV Mid diam:    2.50 cm RV S prime:     16.30 cm/s TAPSE (M-mode): 2.3 cm LEFT ATRIUM             Index        RIGHT ATRIUM           Index LA diam:        3.70 cm 1.80 cm/m   RA Area:     10.90 cm LA Vol (A2C):   60.4 ml 29.46 ml/m  RA Volume:   21.00 ml  10.24 ml/m LA Vol (A4C):   69.4 ml 33.85 ml/m LA Biplane Vol: 65.5 ml 31.94 ml/m  AORTIC VALVE  PULMONIC VALVE AV Area (Vmax):    2.50 cm     PV Vmax:       1.18 m/s AV Area (Vmean):   2.49 cm     PV Peak grad:  5.6 mmHg AV Area (VTI):      2.56 cm AV Vmax:           127.00 cm/s AV Vmean:          86.200 cm/s AV VTI:            0.209 m AV Peak Grad:      6.5 mmHg AV Mean Grad:      4.0 mmHg LVOT Vmax:         101.00 cm/s LVOT Vmean:        68.300 cm/s LVOT VTI:          0.170 m LVOT/AV VTI ratio: 0.81  AORTA Ao Root diam: 3.10 cm Ao Asc diam:  2.90 cm MITRAL VALVE                TRICUSPID VALVE MV Area (PHT): 3.85 cm     TR Peak grad:   14.4 mmHg MV Decel Time: 197 msec     TR Vmax:        190.00 cm/s MR Peak grad: 27.0 mmHg MR Vmax:      260.00 cm/s   SHUNTS MV E velocity: 110.00 cm/s  Systemic VTI:  0.17 m MV A velocity: 85.90 cm/s   Systemic Diam: 2.00 cm MV E/A ratio:  1.28 Dorris Carnes MD Electronically signed by Dorris Carnes MD Signature Date/Time: 10/24/2022/2:43:49 PM    Final     Medications: Infusions:  anticoagulant sodium citrate      Scheduled Medications:  amLODipine  10 mg Oral Daily   Chlorhexidine Gluconate Cloth  6 each Topical Q0600   darbepoetin (ARANESP) injection - NON-DIALYSIS  60 mcg Subcutaneous Q Sat-1800   heparin  5,000 Units Subcutaneous Q8H   insulin aspart  0-5 Units Subcutaneous QHS   insulin aspart  0-9 Units Subcutaneous TID WC   insulin glargine-yfgn  12 Units Subcutaneous Daily   labetalol  100 mg Oral BID   pantoprazole  40 mg Oral BID    have reviewed scheduled and prn medications.  Physical Exam: General:NAD, comfortable Heart:RRR, s1s2 nl Lungs: Clear b/l,  Abdomen:soft, Non-tender, non-distended Extremities:No edema Neurology: Alert, awake and following commands. Sleepy but arousable ACCESS: R IJ TDC, L arm AVF + T/B  Trea Carnegie 10/26/2022,11:53 AM  LOS: 6 days

## 2022-10-26 NOTE — Inpatient Diabetes Management (Signed)
Inpatient Diabetes Program Recommendations  AACE/ADA: New Consensus Statement on Inpatient Glycemic Control (2015)  Target Ranges:  Prepandial:   less than 140 mg/dL      Peak postprandial:   less than 180 mg/dL (1-2 hours)      Critically ill patients:  140 - 180 mg/dL   Lab Results  Component Value Date   GLUCAP 310 (H) 10/26/2022   HGBA1C 6.5 (H) 10/21/2022   Inpatient Diabetes Program Recommendations:   Lyndel Safe, pharmacy tech, did benefit check for continuous glucose sensors. Freestyle Elenor Legato is not covered. Dexcom co-pay is $379.35 due to a $6,500.00 deductible. Gave patient 2 Dexcom 7 sensors for home use and discussed with patient and mom. Patient will be meeting deductible soon and is also applying for Medicare/Medicaid.  Thank you, Nani Gasser. Kentaro Alewine, RN, MSN, CDE  Diabetes Coordinator Inpatient Glycemic Control Team Team Pager [phone] (8am-5pm) 10/26/2022 2:05 PM

## 2022-10-26 NOTE — Progress Notes (Signed)
Requested to see pt for out-pt HD needs at d/c. Met with pt at bedside while receiving HD this am. Introduced self and explained role. Pt lives in [place] and prefers a clinic in this area if possible. Discussed clinic options with pt. Pt voices possible interest in TCU and asks that his significant other be contacted to get her thoughts on options. Contacted pt's significant other, John [name], via phone. Discussed clinic options and TCU. Significant other feels TCU would be a great option for education on HD and education on home therapy options. Significant other feels can arrange transportation for pt to TCU 4x's a week. Referral submitted to [facility] admissions today for review for TCU. Will assist as needed.   Melven Sartorius Renal Navigator [phone]

## 2022-10-26 NOTE — Progress Notes (Signed)
[facility] PROGRESS NOTE   John [name] [id number] DOB: Nov 07, 1990 DOA: 10/20/2022  PCP: Charlott Rakes, MD  Brief History/Interval Summary:   32 year old Caucasian male history of type 1 diabetes since the age of 3, presented to the ER with a 1-2 year history of worsening back pain, abdominal pain.  Patient has been without insurance for a while.  Has been buying his NovoLog and Lantus insulin from the pharmacy.  In the emergency department blood work showed acute kidney injury.  He was hospitalized for further management.   Consultants:  Nephrology.   Vascular surgery     Subjective/Interval History:  No significant events overnight, he is afebrile overnight, last fever was yesterday morning.   Assessment/Plan:  Renal insufficiency/advanced renal failure> ESRD> The last labs we have in our system is from 2021 when he had a creatinine of 1.31.  Presented with a creatinine of 6.69. He does admit to long-term NSAID use.  UA does show proteinuria. CT of the abdomen pelvis does not show any hydronephrosis. Nephrology was consulted.  Patient was given IV fluids and then placed on bicarbonate infusion. Vascular surgery placed TDC, DF 10/25/2022, started on HD, clip in process.  Cough with abnormal chest x-ray/leukocytosis/possible aspiration pneumonia Chest x-ray suggested atelectasis versus opacities.  Patient did have a cough. COVID-19 influenza and RSV PCR's were negative. There was concern for aspiration pneumonia.  Patient started on Unasyn.   Symptoms have improved.  Initial plan was for a 5-day course.  WBC noted to be elevated this morning.  -Spiked fever 101.6 yesterday, no recurrence since, continue to monitor, if he spikes again will need repeat blood cultures. . Continue with incentive spirometry.  Sinus tachycardia/bilateral pleural effusion/small pericardial effusion Likely due to acute illness.  Chest x-ray and CT of the abdomen pelvis does suggest  that he has moderate bilateral pleural effusion and small pericardial effusion.  These are most likely due to renal failure and hypoalbuminemia.  He was given albumin infusions. TSH was normal.  Patient started on labetalol for high blood pressure.   Heart rate seems to be better.  Echocardiogram i showed normal systolic function.  No significant valvular abnormalities.  Small circumferential pericardial effusion was noted.  Abdominal pain with nausea Likely multifactorial.  Lipase level noted to be normal.  LFTs unremarkable.  No concerning findings on CT of the abdomen and pelvis.  Continue with PPI for now.  Nausea appears to be better.  Diabetes mellitus type 1, uncontrolled with hyperglycemia HbA1c is 6.5.  Continue glargine and SSI.  Monitor CBGs.  Seen by diabetes coordinator. At discharge they recommend Basaglar KwikPen's, Humalog KwikPen's, insulin pen needles and prescription for glucose monitoring kit.  Patient interested in Dexcom G7 sensors.  Normocytic anemia Drop in hemoglobin is likely dilutional.  No overt bleeding has been noted.  Anemia panel reviewed.  No deficiencies identified.  His renal failure is likely contributing.  Essential hypertension Blood pressures have been poorly controlled.  Patient was started on amlodipine and labetalol.  Improvement noted.  Continue to monitor for now.  Hydralazine as needed.   Will see his blood pressure trends when he is started on dialysis.  Visual impairment Started in November.  Apparently has been seen by retina specialist.  He is supposed to follow-up for further interventions.  All of this is likely driven by his diabetes and hypertension.  Tobacco abuse Counseled.  DVT Prophylaxis: Subcutaneous heparin Code Status: Full code Family Communication: Discussed with girlfriend at bedside Disposition Plan:  To be determined  Status is: Inpatient Remains inpatient appropriate because: Acute kidney injury      Medications:  Scheduled:  amLODipine  10 mg Oral Daily   Chlorhexidine Gluconate Cloth  6 each Topical Q0600   darbepoetin (ARANESP) injection - NON-DIALYSIS  60 mcg Subcutaneous Q Sat-1800   heparin  5,000 Units Subcutaneous Q8H   insulin aspart  0-5 Units Subcutaneous QHS   insulin aspart  0-9 Units Subcutaneous TID WC   insulin glargine-yfgn  12 Units Subcutaneous Daily   labetalol  100 mg Oral BID   pantoprazole  40 mg Oral BID   Continuous:   [id number] **OR** acetaminophen, hydrALAZINE, methocarbamol, nicotine polacrilex, ondansetron **OR** ondansetron (ZOFRAN) IV, oxyCODONE  Antibiotics: Anti-infectives (From admission, onward)    Start     Dose/Rate Route Frequency Ordered Stop   10/25/22 0845  ceFAZolin (ANCEF) IVPB 2g/100 mL premix        2 g 200 mL/hr over 30 Minutes Intravenous  Once 10/25/22 0839 10/25/22 0939   10/25/22 0840  ceFAZolin (ANCEF) 2-4 GM/100ML-% IVPB       Note to Pharmacy: [facility], [place]: cabinet override      10/25/22 0840 10/25/22 0924   10/21/22 1145  Ampicillin-Sulbactam (UNASYN) 3 g in sodium chloride 0.9 % 100 mL IVPB        3 g 200 mL/hr over 30 Minutes Intravenous Every 12 hours 10/21/22 1058 10/25/22 2216       Objective:  Vital Signs  Vitals:   10/26/22 1030 10/26/22 1100 10/26/22 1130 10/26/22 1208  BP: (!) 143/87 (!) 150/97 (!) 153/90   Pulse:      Resp:      Temp:      TempSrc:      SpO2:      Weight:    97.8 kg  Height:        Intake/Output Summary (Last 24 hours) at 10/26/2022 1236 Last data filed at 10/26/2022 1146 Gross per 24 hour  Intake --  Output 900 ml  Net -900 ml   Filed Weights   10/26/22 0500 10/26/22 0916 10/26/22 1208  Weight: 96.4 kg 98.2 kg 97.8 kg     Awake Alert, Oriented X 3, ill-appearing Symmetrical Chest wall movement, Good air movement bilaterally, CTAB RRR,No Gallops,Rubs or new Murmurs, No Parasternal Heave +ve B.Sounds, Abd Soft, No tenderness, No rebound - guarding or rigidity. No Cyanosis,  Clubbing, No edema    Lab Results:  Data Reviewed: I have personally reviewed following labs and reports of the imaging studies  CBC: Recent Labs  Lab 10/20/22 1238 10/21/22 0753 10/22/22 0606 10/23/22 0619 10/24/22 0307 10/25/22 0710 10/26/22 0705  WBC 12.0* 16.4* 13.5* 10.9* 10.0 12.1* 17.4*  NEUTROABS 9.0* 13.2*  --   --   --   --   --   HGB 9.6* 8.9* 7.9* 7.8* 8.0* 8.6* 8.9*  HCT 27.2* 25.4* 24.0* 23.7* 23.0* 24.1* 25.9*  MCV 83.7 86.4 89.2 89.1 86.1 86.1 87.8  PLT 373 323 321 [phone] [id number]    Basic Metabolic Panel: Recent Labs  Lab 10/21/22 0753 10/22/22 0606 10/23/22 0619 10/24/22 0307 10/25/22 0710 10/26/22 0704  NA 135 135 136 134* 132* 132*  K 3.8 4.0 3.9 3.7 4.0 4.3  CL 106 105 106 102 100 99  CO2 20* 18* 21* 21* 20* 21*  GLUCOSE 253* 190* 164* 175* 288* 312*  BUN 57* 53* 53* 52* 53* 58*  CREATININE 6.56* 6.73* 7.01* 7.29* 7.76* 8.69*  CALCIUM 7.8*  8.1* 8.0* 7.8* 7.8* 7.9*  MG 2.1  --   --   --  2.1  --   PHOS  --  5.0*  --   --  5.9* 6.6*    GFR: Estimated Creatinine Clearance: 14.7 mL/min (A) (by C-G formula based on SCr of 8.69 mg/dL (H)).  Liver Function Tests: Recent Labs  Lab 10/20/22 1238 10/21/22 0753 10/22/22 0606 10/26/22 0704  AST 15 11*  --   --   ALT 13 12  --   --   ALKPHOS 65 47  --   --   BILITOT 0.2* 0.5  --   --   PROT 5.2* 4.4*  --   --   ALBUMIN 3.3* 2.2* 2.9* 2.3*    Recent Labs  Lab 10/20/22 1238  LIPASE 31    CBG: Recent Labs  Lab 10/25/22 1054 10/25/22 1148 10/25/22 1546 10/25/22 2125 10/26/22 0811  GLUCAP 277* 277* 238* 224* 310*    Lipid Profile: No results for input(s): "CHOL", "HDL", "LDLCALC", "TRIG", "CHOLHDL", "LDLDIRECT" in the last 72 hours.    Recent Results (from the past 240 hour(s))  Resp panel by RT-PCR (RSV, Flu A&B, Covid) Anterior Nasal Swab     Status: None   Collection Time: 10/20/22  3:22 PM   Specimen: Anterior Nasal Swab  Result Value Ref Range Status   SARS  Coronavirus 2 by RT PCR NEGATIVE NEGATIVE Final    Comment: (NOTE) SARS-CoV-2 target nucleic acids are NOT DETECTED.  The SARS-CoV-2 RNA is generally detectable in upper respiratory specimens during the acute phase of infection. The lowest concentration of SARS-CoV-2 viral copies this assay can detect is 138 copies/mL. A negative result does not preclude SARS-Cov-2 infection and should not be used as the sole basis for treatment or other patient management decisions. A negative result may occur with  improper specimen collection/handling, submission of specimen other than nasopharyngeal swab, presence of viral mutation(s) within the areas targeted by this assay, and inadequate number of viral copies(<138 copies/mL). A negative result must be combined with clinical observations, patient history, and epidemiological information. The expected result is Negative.  Fact Sheet for Patients:  EntrepreneurPulse.com.au  Fact Sheet for Healthcare Providers:  IncredibleEmployment.be  This test is no t yet approved or cleared by the [place] FDA and  has been authorized for detection and/or diagnosis of SARS-CoV-2 by FDA under an Emergency Use Authorization (EUA). This EUA will remain  in effect (meaning this test can be used) for the duration of the COVID-19 declaration under Section 564(b)(1) of the Act, 21 U.S.C.section 360bbb-3(b)(1), unless the authorization is terminated  or revoked sooner.       Influenza A by PCR NEGATIVE NEGATIVE Final   Influenza B by PCR NEGATIVE NEGATIVE Final    Comment: (NOTE) The Xpert Xpress SARS-CoV-2/FLU/RSV plus assay is intended as an aid in the diagnosis of influenza from Nasopharyngeal swab specimens and should not be used as a sole basis for treatment. Nasal washings and aspirates are unacceptable for Xpert Xpress SARS-CoV-2/FLU/RSV testing.  Fact Sheet for  Patients: EntrepreneurPulse.com.au  Fact Sheet for Healthcare Providers: IncredibleEmployment.be  This test is not yet approved or cleared by the [place] FDA and has been authorized for detection and/or diagnosis of SARS-CoV-2 by FDA under an Emergency Use Authorization (EUA). This EUA will remain in effect (meaning this test can be used) for the duration of the COVID-19 declaration under Section 564(b)(1) of the Act, 21 U.S.C. section 360bbb-3(b)(1), unless the authorization is  terminated or revoked.     Resp Syncytial Virus by PCR NEGATIVE NEGATIVE Final    Comment: (NOTE) Fact Sheet for Patients: EntrepreneurPulse.com.au  Fact Sheet for Healthcare Providers: IncredibleEmployment.be  This test is not yet approved or cleared by the [place] FDA and has been authorized for detection and/or diagnosis of SARS-CoV-2 by FDA under an Emergency Use Authorization (EUA). This EUA will remain in effect (meaning this test can be used) for the duration of the COVID-19 declaration under Section 564(b)(1) of the Act, 21 U.S.C. section 360bbb-3(b)(1), unless the authorization is terminated or revoked.  Performed at [facility], [address] [place], [place] [address]       Radiology Studies: DG CHEST PORT 1 VIEW  Result Date: 10/25/2022 CLINICAL DATA:  Status post insertion of the dialysis catheter. EXAM: PORTABLE CHEST 1 VIEW COMPARISON:  Chest radiograph 10/20/2022. FINDINGS: 1409 hours. Interval insertion of a right IJ approach dialysis catheter with tip projecting over the superior cavoatrial junction. Unchanged mild cardiomegaly. New moderate pulmonary edema with increasing small left and moderate right pleural effusions. Unchanged left basilar atelectasis. No pneumothorax. IMPRESSION: 1. Interval placement of a right IJ approach dialysis catheter with tip projecting over the superior  cavoatrial junction. 2. New moderate pulmonary edema with increasing small left and moderate right pleural effusions. Electronically Signed   By: Emmit Alexanders M.D.   On: 10/25/2022 14:22   DG C-Arm 1-60 Min-No Report  Result Date: 10/25/2022 Fluoroscopy was utilized by the requesting physician.  No radiographic interpretation.   VAS [place] UPPER EXT VEIN MAPPING (PRE-OP AVF)  Result Date: 10/24/2022 UPPER EXTREMITY VEIN MAPPING Patient Name:  John [name]  Date of Exam:   10/24/2022 Medical Rec #: [id number]        Accession #:    [id number] Date of Birth: 03/06/1991        Patient Gender: M Patient Age:   57 years Exam Location:  [facility] Procedure:      VAS [place] UPPER EXT VEIN MAPPING (PRE-OP AVF) Referring Phys: Aldona Bar RHYNE --------------------------------------------------------------------------------  Indications: Pre-op possible AVF placement, acute kidney failure. Limitations: Bandage and IV placement left AC fossa area. Comparison Study: No prior study. Performing Technologist: McKayla Maag RVT, [place]  Examination Guidelines: A complete evaluation includes B-mode imaging, spectral Doppler, color Doppler, and power Doppler as needed of all accessible portions of each vessel. Bilateral testing is considered an integral part of a complete examination. Limited examinations for reoccurring indications may be performed as noted. +-----------------+-------------+----------+---------+ Right Cephalic   Diameter (cm)Depth (cm)Findings  +-----------------+-------------+----------+---------+ Shoulder             0.32        0.58             +-----------------+-------------+----------+---------+ Prox upper arm       0.44        0.47             +-----------------+-------------+----------+---------+ Mid upper arm        0.37        0.28             +-----------------+-------------+----------+---------+ Dist upper arm       0.38        0.36              +-----------------+-------------+----------+---------+ Antecubital fossa    0.30        0.23   branching +-----------------+-------------+----------+---------+ Prox forearm  0.26        0.37             +-----------------+-------------+----------+---------+ Mid forearm          0.17        0.38   branching +-----------------+-------------+----------+---------+ Dist forearm         0.32        0.27             +-----------------+-------------+----------+---------+ Wrist                0.28        0.23             +-----------------+-------------+----------+---------+ +-----------------+-------------+----------+---------+ Right Basilic    Diameter (cm)Depth (cm)Findings  +-----------------+-------------+----------+---------+ Prox upper arm       0.71        0.78             +-----------------+-------------+----------+---------+ Mid upper arm        0.42        0.81             +-----------------+-------------+----------+---------+ Dist upper arm       0.48        0.76             +-----------------+-------------+----------+---------+ Antecubital fossa    0.26        0.38             +-----------------+-------------+----------+---------+ Prox forearm         0.33        0.37             +-----------------+-------------+----------+---------+ Mid forearm          0.28        0.29   branching +-----------------+-------------+----------+---------+ Distal forearm       0.26        0.32             +-----------------+-------------+----------+---------+ Wrist                0.17        0.51             +-----------------+-------------+----------+---------+ +-----------------+-------------+----------+-----------------------------------+ Left Cephalic    Diameter (cm)Depth (cm)             Findings               +-----------------+-------------+----------+-----------------------------------+ Shoulder             0.43        0.87                                        +-----------------+-------------+----------+-----------------------------------+ Prox upper arm       0.30        0.47                                       +-----------------+-------------+----------+-----------------------------------+ Mid upper arm        0.27        0.23                                       +-----------------+-------------+----------+-----------------------------------+ Dist upper arm       0.34  0.20                                       +-----------------+-------------+----------+-----------------------------------+ Antecubital fossa                        Not visualized due to bandage and                                                     IV placement             +-----------------+-------------+----------+-----------------------------------+ Prox forearm                             Not visualized due to bandage and                                                     IV placement             +-----------------+-------------+----------+-----------------------------------+ Mid forearm          0.32        0.41                                       +-----------------+-------------+----------+-----------------------------------+ Dist forearm         0.36        0.24                                       +-----------------+-------------+----------+-----------------------------------+ Wrist                0.31        0.54                                       +-----------------+-------------+----------+-----------------------------------+ +-----------------+-------------+----------+---------+ Left Basilic     Diameter (cm)Depth (cm)Findings  +-----------------+-------------+----------+---------+ Prox upper arm       0.48        0.93             +-----------------+-------------+----------+---------+ Mid upper arm        0.48        1.34             +-----------------+-------------+----------+---------+  Dist upper arm       0.32        0.88   branching +-----------------+-------------+----------+---------+ Antecubital fossa    0.43        0.46             +-----------------+-------------+----------+---------+ Prox forearm         0.31        0.61             +-----------------+-------------+----------+---------+ Mid forearm          0.35        0.53             +-----------------+-------------+----------+---------+  Distal forearm       0.29        0.51             +-----------------+-------------+----------+---------+ Wrist                0.12        0.62             +-----------------+-------------+----------+---------+ *See table(s) above for measurements and observations.  Diagnosing physician: Servando Snare MD Electronically signed by Servando Snare MD on 10/24/2022 at 7:27:26 PM.    Final    ECHOCARDIOGRAM COMPLETE  Result Date: 10/24/2022    ECHOCARDIOGRAM REPORT   Patient Name:   John [name] Date of Exam: 10/24/2022 Medical Rec #:  [id number]       Height:       70.0 in Accession #:    [id number]      Weight:       191.8 lb Date of Birth:  09-05-1990       BSA:          2.051 m Patient Age:    48 years        BP:           137/88 mmHg Patient Gender: M               HR:           99 bpm. Exam Location:  Inpatient Procedure: 2D Echo Indications:    dyspnea  History:        Patient has no prior history of Echocardiogram examinations.                 Risk Factors:Current Smoker and Hypertension.  Sonographer:    Harvie Junior Referring Phys: Bonnielee Haff IMPRESSIONS  1. Left ventricular ejection fraction, by estimation, is 55%. The left ventricle has low normal function. The left ventricle has no regional wall motion abnormalities. There is mild concentric left ventricular hypertrophy. Left ventricular diastolic parameters were normal.  2. Right ventricular systolic function is normal. The right ventricular size is normal. There is normal pulmonary artery systolic pressure.  3.  Left atrial size was mildly dilated.  4. A small pericardial effusion is present. The pericardial effusion is circumferential.  5. Mild mitral valve regurgitation.  6. The aortic valve is tricuspid. Aortic valve regurgitation is not visualized.  7. The inferior vena cava is normal in size with greater than 50% respiratory variability, suggesting right atrial pressure of 3 mmHg. FINDINGS  Left Ventricle: Left ventricular ejection fraction, by estimation, is 55%. The left ventricle has low normal function. The left ventricle has no regional wall motion abnormalities. The left ventricular internal cavity size was normal in size. There is mild concentric left ventricular hypertrophy. Left ventricular diastolic parameters were normal. Right Ventricle: The right ventricular size is normal. Right vetricular wall thickness was not assessed. Right ventricular systolic function is normal. There is normal pulmonary artery systolic pressure. The tricuspid regurgitant velocity is 1.90 m/s, and with an assumed right atrial pressure of 3 mmHg, the estimated right ventricular systolic pressure is 0000000 mmHg. Left Atrium: Left atrial size was mildly dilated. Right Atrium: Right atrial size was normal in size. Pericardium: A small pericardial effusion is present. The pericardial effusion is circumferential. Mitral Valve: There is mild thickening of the mitral valve leaflet(s). Mild mitral annular calcification. Mild mitral valve regurgitation. Tricuspid Valve: The tricuspid valve is normal in structure. Tricuspid valve regurgitation is trivial. Aortic Valve: The aortic  valve is tricuspid. Aortic valve regurgitation is not visualized. Aortic valve mean gradient measures 4.0 mmHg. Aortic valve peak gradient measures 6.5 mmHg. Aortic valve area, by VTI measures 2.56 cm. Pulmonic Valve: The pulmonic valve was normal in structure. Pulmonic valve regurgitation is not visualized. Aorta: The aortic root and ascending aorta are structurally  normal, with no evidence of dilitation. Venous: The inferior vena cava is normal in size with greater than 50% respiratory variability, suggesting right atrial pressure of 3 mmHg. IAS/Shunts: No atrial level shunt detected by color flow Doppler.  LEFT VENTRICLE PLAX 2D LVIDd:         4.60 cm      Diastology LVIDs:         3.50 cm      LV e' medial:    7.62 cm/s LV PW:         1.20 cm      LV E/e' medial:  14.4 LV IVS:        1.20 cm      LV e' lateral:   11.90 cm/s LVOT diam:     2.00 cm      LV E/e' lateral: 9.2 LV SV:         53 LV SV Index:   26 LVOT Area:     3.14 cm                              3D Volume EF: LV Volumes (MOD)            3D EF:        45 % LV vol d, MOD A2C: 106.0 ml LV EDV:       169 ml LV vol d, MOD A4C: 120.0 ml LV ESV:       93 ml LV vol s, MOD A2C: 62.9 ml  LV SV:        76 ml LV vol s, MOD A4C: 56.3 ml LV SV MOD A2C:     43.1 ml LV SV MOD A4C:     120.0 ml LV SV MOD BP:      53.4 ml RIGHT VENTRICLE RV Basal diam:  2.90 cm RV Mid diam:    2.50 cm RV S prime:     16.30 cm/s TAPSE (M-mode): 2.3 cm LEFT ATRIUM             Index        RIGHT ATRIUM           Index LA diam:        3.70 cm 1.80 cm/m   RA Area:     10.90 cm LA Vol (A2C):   60.4 ml 29.46 ml/m  RA Volume:   21.00 ml  10.24 ml/m LA Vol (A4C):   69.4 ml 33.85 ml/m LA Biplane Vol: 65.5 ml 31.94 ml/m  AORTIC VALVE                    PULMONIC VALVE AV Area (Vmax):    2.50 cm     PV Vmax:       1.18 m/s AV Area (Vmean):   2.49 cm     PV Peak grad:  5.6 mmHg AV Area (VTI):     2.56 cm AV Vmax:           127.00 cm/s AV Vmean:          86.200 cm/s AV VTI:  0.209 m AV Peak Grad:      6.5 mmHg AV Mean Grad:      4.0 mmHg LVOT Vmax:         101.00 cm/s LVOT Vmean:        68.300 cm/s LVOT VTI:          0.170 m LVOT/AV VTI ratio: 0.81  AORTA Ao Root diam: 3.10 cm Ao Asc diam:  2.90 cm MITRAL VALVE                TRICUSPID VALVE MV Area (PHT): 3.85 cm     TR Peak grad:   14.4 mmHg MV Decel Time: 197 msec     TR Vmax:         190.00 cm/s MR Peak grad: 27.0 mmHg MR Vmax:      260.00 cm/s   SHUNTS MV E velocity: 110.00 cm/s  Systemic VTI:  0.17 m MV A velocity: 85.90 cm/s   Systemic Diam: 2.00 cm MV E/A ratio:  1.28 Dorris Carnes MD Electronically signed by Dorris Carnes MD Signature Date/Time: 10/24/2022/2:43:49 PM    Final        LOS: 6 days   Phillips Climes MD  [facility] Pager on [email or web address]  10/26/2022, 12:36 PM

## 2022-10-26 NOTE — Plan of Care (Signed)
  Problem: Education: Goal: Knowledge of General Education information will improve Description: Including pain rating scale, medication(s)/side effects and non-pharmacologic comfort measures Outcome: Progressing   Problem: Clinical Measurements: Goal: Ability to maintain clinical measurements within normal limits will improve Outcome: Progressing Goal: Will remain free from infection Outcome: Progressing   Problem: Nutrition: Goal: Adequate nutrition will be maintained Outcome: Progressing   Problem: Elimination: Goal: Will not experience complications related to bowel motility Outcome: Progressing Goal: Will not experience complications related to urinary retention Outcome: Progressing   Problem: Safety: Goal: Ability to remain free from injury will improve Outcome: Progressing   Problem: Fluid Volume: Goal: Ability to maintain a balanced intake and output will improve Outcome: Progressing   Problem: Health Behavior/Discharge Planning: Goal: Ability to identify and utilize available resources and services will improve Outcome: Progressing   Problem: Metabolic: Goal: Ability to maintain appropriate glucose levels will improve Outcome: Progressing   Problem: Nutritional: Goal: Progress toward achieving an optimal weight will improve Outcome: Progressing

## 2022-10-26 NOTE — Plan of Care (Signed)

## 2022-10-26 NOTE — Progress Notes (Signed)
Received patient in bed to unit.  Alert and oriented.  Informed consent signed and in chart.   [place] duration:2h  Patient tolerated  1st treatment well.  Transported back to the room  Alert, without acute distress.  Hand-off given to patient's nurse.   Access used: Catheter Access issues: none  Total UF removed: .5L Medication(s) given:  Post HD VS: 157/90,94,12,100%,98.7 Post HD weight: 97.8kg   [facility]

## 2022-10-26 NOTE — Progress Notes (Signed)
    Doing well after dialysis today for catheter was used.  Strong left arm thrill.  Will follow-up in 4 to 6 weeks in the office with left arm dialysis duplex.  The fistula will need 3 months maturation prior to use.  Taryll C. Donzetta Matters, MD [facility] Office: [phone] Pager: [phone]

## 2022-10-27 LAB — CBC
HCT: 23.8 % — ABNORMAL LOW (ref 39.0–52.0)
Hemoglobin: 8.4 g/dL — ABNORMAL LOW (ref 13.0–17.0)
MCH: 30.8 pg (ref 26.0–34.0)
MCHC: 35.3 g/dL (ref 30.0–36.0)
MCV: 87.2 fL (ref 80.0–100.0)
Platelets: 337 10*3/uL (ref 150–400)
RBC: 2.73 MIL/uL — ABNORMAL LOW (ref 4.22–5.81)
RDW: 13.4 % (ref 11.5–15.5)
WBC: 11.9 10*3/uL — ABNORMAL HIGH (ref 4.0–10.5)
nRBC: 0 % (ref 0.0–0.2)

## 2022-10-27 LAB — BASIC METABOLIC PANEL
Anion gap: 11 (ref 5–15)
BUN: 44 mg/dL — ABNORMAL HIGH (ref 6–20)
CO2: 24 mmol/L (ref 22–32)
Calcium: 8 mg/dL — ABNORMAL LOW (ref 8.9–10.3)
Chloride: 99 mmol/L (ref 98–111)
Creatinine, Ser: 7.38 mg/dL — ABNORMAL HIGH (ref 0.61–1.24)
GFR, Estimated: 9 mL/min — ABNORMAL LOW (ref >60)
Glucose, Bld: 202 mg/dL — ABNORMAL HIGH (ref 70–99)
Potassium: 3.7 mmol/L (ref 3.5–5.1)
Sodium: 134 mmol/L — ABNORMAL LOW (ref 135–145)

## 2022-10-27 LAB — GLUCOSE, CAPILLARY
Glucose-Capillary: 196 mg/dL — ABNORMAL HIGH (ref 70–99)
Glucose-Capillary: 159 mg/dL — ABNORMAL HIGH (ref 70–99)
Glucose-Capillary: 115 mg/dL — ABNORMAL HIGH (ref 70–99)
Glucose-Capillary: 98 mg/dL (ref 70–99)

## 2022-10-27 LAB — PHOSPHORUS: Phosphorus: 5.8 mg/dL — ABNORMAL HIGH (ref 2.5–4.6)

## 2022-10-27 MED ORDER — INSULIN GLARGINE-YFGN 100 UNIT/ML ~~LOC~~ SOLN
20.0000 [IU] | Freq: Every day | SUBCUTANEOUS | Status: DC
  Administered 2022-10-28: 20 [IU] via SUBCUTANEOUS
  Filled 2022-10-27: qty 0.2

## 2022-10-27 MED ORDER — INSULIN GLARGINE-YFGN 100 UNIT/ML ~~LOC~~ SOLN
4.0000 [IU] | Freq: Once | SUBCUTANEOUS | Status: AC
  Administered 2022-10-27: 4 [IU] via SUBCUTANEOUS
  Filled 2022-10-27: qty 0.04

## 2022-10-27 MED ORDER — INSULIN ASPART 100 UNIT/ML IJ SOLN
3.0000 [IU] | Freq: Three times a day (TID) | INTRAMUSCULAR | Status: DC
  Administered 2022-10-27 – 2022-10-29 (×4): 3 [IU] via SUBCUTANEOUS

## 2022-10-27 NOTE — Progress Notes (Addendum)
[facility] PROGRESS NOTE   John [name] [id number] DOB: Feb 07, 1991 DOA: 10/20/2022  PCP: Charlott Rakes, MD  Brief History/Interval Summary:   32 year old Caucasian male history of type 1 diabetes since the age of 52, presented to the ER with a 1-2 year history of worsening back pain, abdominal pain.  Patient has been without insurance for a while.  Has been buying his NovoLog and Lantus insulin from the pharmacy.  In the emergency department blood work showed acute kidney injury.  He was hospitalized for further management.   Consultants:  Nephrology.   Vascular surgery     Subjective/Interval History:  No significant events overnight, feeling weak, poor appetite, reports constipation, no BM since admission.   Assessment/Plan:  Renal insufficiency/advanced renal failure> ESRD> The last labs we have in our system is from 2021 when he had a creatinine of 1.31.  Presented with a creatinine of 6.69. He does admit to long-term NSAID use.  UA does show proteinuria. CT of the abdomen pelvis does not show any hydronephrosis. Nephrology was consulted.  Patient was given IV fluids and then placed on bicarbonate infusion. Vascular surgery placed TDC, DF 10/25/2022, started hemodialysis on 3/6, management per renal, clip in process  Cough with abnormal chest x-ray/leukocytosis/possible aspiration pneumonia Chest x-ray suggested atelectasis versus opacities.  Patient did have a cough. COVID-19 influenza and RSV PCR's were negative. There was concern for aspiration pneumonia.  Patient started on Unasyn.   Symptoms have improved.  Initial plan was for a 5-day course.  WBC noted to be elevated this morning.  -Spiked fever 101.6 3/5, no recurrence since, continue to monitor, if he spikes again will need repeat blood cultures. . Continue with incentive spirometry.  Sinus tachycardia/bilateral pleural effusion/small pericardial effusion Likely due to acute illness.  Chest x-ray  and CT of the abdomen pelvis does suggest that he has moderate bilateral pleural effusion and small pericardial effusion.  These are most likely due to renal failure and hypoalbuminemia.  He was given albumin infusions. TSH was normal.  Patient started on labetalol for high blood pressure.   Heart rate seems to be better.  Echocardiogram showed normal systolic function.  No significant valvular abnormalities.  Small circumferential pericardial effusion was noted. -Volume status should improve with dialysis  Abdominal pain with nausea Likely multifactorial.  Lipase level noted to be normal.  LFTs unremarkable.  No concerning findings on CT of the abdomen and pelvis.  Continue with PPI for now.  Nausea appears to be better.  Diabetes mellitus type 1, uncontrolled with hyperglycemia HbA1c is 6.5.  Continue glargine and SSI.  Monitor CBGs.  Seen by diabetes coordinator. CBG remains poorly controlled, will increase his Semglee to 20 units, and add 3 units before meals.  Normocytic anemia Drop in hemoglobin is likely dilutional.  No overt bleeding has been noted.  Anemia panel reviewed.  No deficiencies identified.  His renal failure is likely contributing.  Essential hypertension Blood pressures have been poorly controlled.  Patient was started on amlodipine and labetalol.  Improvement noted.  Continue to monitor for now.  Hydralazine as needed.   Will see his blood pressure trends when he is started on dialysis.  Visual impairment Started in November.  Apparently has been seen by retina specialist.  He is supposed to follow-up for further interventions.  All of this is likely driven by his diabetes and hypertension.  Tobacco abuse Counseled.  DVT Prophylaxis: Subcutaneous heparin Code Status: Full code Family Communication: Discussed with girlfriend at  bedside Disposition Plan: To be determined  Status is: Inpatient Remains inpatient appropriate because: Acute kidney  injury      Medications: Scheduled:  amLODipine  10 mg Oral Daily   Chlorhexidine Gluconate Cloth  6 each Topical Q0600   darbepoetin (ARANESP) injection - NON-DIALYSIS  60 mcg Subcutaneous Q Sat-1800   heparin  5,000 Units Subcutaneous Q8H   insulin aspart  0-5 Units Subcutaneous QHS   insulin aspart  0-9 Units Subcutaneous TID WC   insulin glargine-yfgn  16 Units Subcutaneous Daily   labetalol  100 mg Oral BID   pantoprazole  40 mg Oral BID   polyethylene glycol  17 g Oral BID   senna-docusate  1 tablet Oral BID   Continuous:   [id number] **OR** acetaminophen, hydrALAZINE, methocarbamol, nicotine polacrilex, ondansetron **OR** ondansetron (ZOFRAN) IV, oxyCODONE, polyethylene glycol **FOLLOWED BY** [START ON 10/29/2022] polyethylene glycol  Antibiotics: Anti-infectives (From admission, onward)    Start     Dose/Rate Route Frequency Ordered Stop   10/25/22 0845  ceFAZolin (ANCEF) IVPB 2g/100 mL premix        2 g 200 mL/hr over 30 Minutes Intravenous  Once 10/25/22 0839 10/25/22 0939   10/25/22 0840  ceFAZolin (ANCEF) 2-4 GM/100ML-% IVPB       Note to Pharmacy: [facility], [place]: cabinet override      10/25/22 0840 10/25/22 0924   10/21/22 1145  Ampicillin-Sulbactam (UNASYN) 3 g in sodium chloride 0.9 % 100 mL IVPB        3 g 200 mL/hr over 30 Minutes Intravenous Every 12 hours 10/21/22 1058 10/25/22 2216       Objective:  Vital Signs  Vitals:   10/27/22 0414 10/27/22 0415 10/27/22 0759 10/27/22 1144  BP: 122/81  (!) 146/96   Pulse: 80  100 95  Resp: 12     Temp:   98.6 F (37 C) 98.4 F (36.9 C)  TempSrc:   Oral Oral  SpO2:   (!) 8% 91%  Weight:  97.6 kg    Height:        Intake/Output Summary (Last 24 hours) at 10/27/2022 1329 Last data filed at 10/26/2022 2014 Gross per 24 hour  Intake --  Output 700 ml  Net -700 ml   Filed Weights   10/26/22 0916 10/26/22 1208 10/27/22 0415  Weight: 98.2 kg 97.8 kg 97.6 kg     Awake Alert, Oriented X 3,  ill-appearing Symmetrical Chest wall movement, Good air movement bilaterally, CTAB RRR,No Gallops,Rubs or new Murmurs, No Parasternal Heave +ve B.Sounds, Abd Soft, No tenderness, No rebound - guarding or rigidity. No Cyanosis, Clubbing, No edema    Lab Results:  Data Reviewed: I have personally reviewed following labs and reports of the imaging studies  CBC: Recent Labs  Lab 10/21/22 0753 10/22/22 0606 10/23/22 0619 10/24/22 0307 10/25/22 0710 10/26/22 0705 10/27/22 0730  WBC 16.4*   < > 10.9* 10.0 12.1* 17.4* 11.9*  NEUTROABS 13.2*  --   --   --   --   --   --   HGB 8.9*   < > 7.8* 8.0* 8.6* 8.9* 8.4*  HCT 25.4*   < > 23.7* 23.0* 24.1* 25.9* 23.8*  MCV 86.4   < > 89.1 86.1 86.1 87.8 87.2  PLT 323   < > 334 303 314 335 337   < > = values in this interval not displayed.    Basic Metabolic Panel: Recent Labs  Lab 10/21/22 0753 10/22/22 0606 10/23/22 [id number] 10/24/22 0307 10/25/22  0710 10/26/22 0704 10/27/22 0730  NA 135 135 136 134* 132* 132* 134*  K 3.8 4.0 3.9 3.7 4.0 4.3 3.7  CL 106 105 106 102 100 99 99  CO2 20* 18* 21* 21* 20* 21* 24  GLUCOSE 253* 190* 164* 175* 288* 312* 202*  BUN 57* 53* 53* 52* 53* 58* 44*  CREATININE 6.56* 6.73* 7.01* 7.29* 7.76* 8.69* 7.38*  CALCIUM 7.8* 8.1* 8.0* 7.8* 7.8* 7.9* 8.0*  MG 2.1  --   --   --  2.1  --   --   PHOS  --  5.0*  --   --  5.9* 6.6* 5.8*    GFR: Estimated Creatinine Clearance: 17.3 mL/min (A) (by C-G formula based on SCr of 7.38 mg/dL (H)).  Liver Function Tests: Recent Labs  Lab 10/21/22 0753 10/22/22 0606 10/26/22 0704  AST 11*  --   --   ALT 12  --   --   ALKPHOS 47  --   --   BILITOT 0.5  --   --   PROT 4.4*  --   --   ALBUMIN 2.2* 2.9* 2.3*    No results for input(s): "LIPASE", "AMYLASE" in the last 168 hours.   CBG: Recent Labs  Lab 10/25/22 2125 10/26/22 0811 10/26/22 1557 10/26/22 2110 10/27/22 0804  GLUCAP 224* 310* 309* 325* 196*    Lipid Profile: No results for input(s):  "CHOL", "HDL", "LDLCALC", "TRIG", "CHOLHDL", "LDLDIRECT" in the last 72 hours.    Recent Results (from the past 240 hour(s))  Resp panel by RT-PCR (RSV, Flu A&B, Covid) Anterior Nasal Swab     Status: None   Collection Time: 10/20/22  3:22 PM   Specimen: Anterior Nasal Swab  Result Value Ref Range Status   SARS Coronavirus 2 by RT PCR NEGATIVE NEGATIVE Final    Comment: (NOTE) SARS-CoV-2 target nucleic acids are NOT DETECTED.  The SARS-CoV-2 RNA is generally detectable in upper respiratory specimens during the acute phase of infection. The lowest concentration of SARS-CoV-2 viral copies this assay can detect is 138 copies/mL. A negative result does not preclude SARS-Cov-2 infection and should not be used as the sole basis for treatment or other patient management decisions. A negative result may occur with  improper specimen collection/handling, submission of specimen other than nasopharyngeal swab, presence of viral mutation(s) within the areas targeted by this assay, and inadequate number of viral copies(<138 copies/mL). A negative result must be combined with clinical observations, patient history, and epidemiological information. The expected result is Negative.  Fact Sheet for Patients:  EntrepreneurPulse.com.au  Fact Sheet for Healthcare Providers:  IncredibleEmployment.be  This test is no t yet approved or cleared by the [place] FDA and  has been authorized for detection and/or diagnosis of SARS-CoV-2 by FDA under an Emergency Use Authorization (EUA). This EUA will remain  in effect (meaning this test can be used) for the duration of the COVID-19 declaration under Section 564(b)(1) of the Act, 21 U.S.C.section 360bbb-3(b)(1), unless the authorization is terminated  or revoked sooner.       Influenza A by PCR NEGATIVE NEGATIVE Final   Influenza B by PCR NEGATIVE NEGATIVE Final    Comment: (NOTE) The Xpert Xpress  SARS-CoV-2/FLU/RSV plus assay is intended as an aid in the diagnosis of influenza from Nasopharyngeal swab specimens and should not be used as a sole basis for treatment. Nasal washings and aspirates are unacceptable for Xpert Xpress SARS-CoV-2/FLU/RSV testing.  Fact Sheet for Patients: EntrepreneurPulse.com.au  Fact Sheet  for Healthcare Providers: IncredibleEmployment.be  This test is not yet approved or cleared by the [place] and has been authorized for detection and/or diagnosis of SARS-CoV-2 by FDA under an Emergency Use Authorization (EUA). This EUA will remain in effect (meaning this test can be used) for the duration of the COVID-19 declaration under Section 564(b)(1) of the Act, 21 U.S.C. section 360bbb-3(b)(1), unless the authorization is terminated or revoked.     Resp Syncytial Virus by PCR NEGATIVE NEGATIVE Final    Comment: (NOTE) Fact Sheet for Patients: EntrepreneurPulse.com.au  Fact Sheet for Healthcare Providers: IncredibleEmployment.be  This test is not yet approved or cleared by the [place] FDA and has been authorized for detection and/or diagnosis of SARS-CoV-2 by FDA under an Emergency Use Authorization (EUA). This EUA will remain in effect (meaning this test can be used) for the duration of the COVID-19 declaration under Section 564(b)(1) of the Act, 21 U.S.C. section 360bbb-3(b)(1), unless the authorization is terminated or revoked.  Performed at [facility], [address], [place], [place] [address]       Radiology Studies: DG CHEST PORT 1 VIEW  Result Date: 10/25/2022 CLINICAL DATA:  Status post insertion of the dialysis catheter. EXAM: PORTABLE CHEST 1 VIEW COMPARISON:  Chest radiograph 10/20/2022. FINDINGS: 1409 hours. Interval insertion of a right IJ approach dialysis catheter with tip projecting over the superior cavoatrial junction. Unchanged  mild cardiomegaly. New moderate pulmonary edema with increasing small left and moderate right pleural effusions. Unchanged left basilar atelectasis. No pneumothorax. IMPRESSION: 1. Interval placement of a right IJ approach dialysis catheter with tip projecting over the superior cavoatrial junction. 2. New moderate pulmonary edema with increasing small left and moderate right pleural effusions. Electronically Signed   By: Emmit Alexanders M.D.   On: 10/25/2022 14:22       LOS: 7 days   Phillips Climes MD  [facility] Pager on [email or web address]  10/27/2022, 1:29 PM

## 2022-10-27 NOTE — Progress Notes (Signed)
New Dialysis Start    Patient identified as new dialysis start. Kidney Education packet assembled and given. Discussed the following items with patient and significant other:     Current medications and possible changes once started:  Discussed that patient's medications may change over time.  Ex; hypertension medications and diabetes medication.  Nephrologists will adjust as needed.   Fluid restrictions reviewed:  32 oz daily goal:  All liquids count; soups, ice, jello and fruits.   Phosphorus and potassium: Handout given showing high potassium and phosphorus foods.  Alternative food and drink options given.   Family support:  Significant other at bedside and said that she is formulating a "tribe" of family and friends to help them manage the lifestyle change.   Outpatient Clinic Resources:  Discussed roles of Outpatient clinic staff and advised to make a list of needs, if any, to talk with outpatient staff if needed.   Care plan schedule: Informed patient of Care Plans in outpatient setting and to participate in the care plan.  An invitation would be given from outpatient clinic.    Dialysis Access Options:  Reviewed access options with patients. Discussed in detail about care at home with new AVG & AVF. Reviewed checking bruit and thrill. If dialysis catheter present, educated that patient could not take showers.  Catheter dressing changes were to be done by outpatient clinic staff only.   Home therapy options:  Educated patient about home therapy options:  PD vs home hemo.     Patient and significant other verbalized understanding. Will continue to round on patient during admission.    Aurther Loft [facility] Nurse Coordinator [phone]

## 2022-10-27 NOTE — Plan of Care (Signed)

## 2022-10-27 NOTE — Progress Notes (Signed)
[facility] NEPHROLOGY PROGRESS NOTE  Assessment/ Plan: Pt is a 32 y.o. yo male  with history of uncontrolled type 1 diabetes, CKD presented to the ER with worsening body pain associated with nausea and vomiting seen as a consultation for worsening renal failure.   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  He may have some component of AKI due to GI loss/decreased oral intake and NSAIDs use.  The last creatinine level was around 1.3 in 2021 and no results available to review from 2021-2024.  He does have uncontrolled diabetes and hypertension in between.  UA with chronic proteinuria.  CT scan ruled out hydronephrosis. Treating with IV fluid with mildly increased urine output however continue to worsen renal function.   - will need to start dialysis this admission  - VVS placed [facility] and AVF 10/25/22- appreciate assistance  - HD #1 10/26/22  - CLIP in process  - HD #2 10/28/22  - will place OT consult for scrotal swelling   # Hypertension: Amlodipine increased to 10 mg and added labetalol.  Monitor blood pressure and heart rate.  Getting echo.    # Cough/possibly aspiration: s/p unasyn   # Anemia of CKD: Iron saturation 41%, serum iron 76.  Treated with a dose of IV iron and Aranesp on 3/2.      # CKD-MBD: Pending PTH level.monitor calcium and phosphorus level.    Discussed with the primary team.  Subjective: Seen on dialysis.  HD #1 today.  Doing better, more awake and interactive.  Walked by his room on 5W as well- SO not in there- she usually has questions.    Objective Vital signs in last 24 hours: Vitals:   10/27/22 0414 10/27/22 0415 10/27/22 0759 10/27/22 1144  BP: 122/81  (!) 146/96   Pulse: 80  100 95  Resp: 12     Temp:   98.6 F (37 C) 98.4 F (36.9 C)  TempSrc:   Oral Oral  SpO2:   (!) 8% 91%  Weight:  97.6 kg    Height:       Weight change: 12 kg  Intake/Output Summary (Last 24  hours) at 10/27/2022 1158 Last data filed at 10/26/2022 2014 Gross per 24 hour  Intake --  Output 700 ml  Net -700 ml       Labs: RENAL PANEL Recent Labs  Lab 10/20/22 1238 10/21/22 0753 10/22/22 0606 10/23/22 0619 10/24/22 0307 10/25/22 0710 10/26/22 0704 10/27/22 0730  NA 136 135 135 136 134* 132* 132* 134*  K 3.9 3.8 4.0 3.9 3.7 4.0 4.3 3.7  CL 100 106 105 106 102 100 99 99  CO2 24 20* 18* 21* 21* 20* 21* 24  GLUCOSE 331* 253* 190* 164* 175* 288* 312* 202*  BUN 64* 57* 53* 53* 52* 53* 58* 44*  CREATININE 6.69* 6.56* 6.73* 7.01* 7.29* 7.76* 8.69* 7.38*  CALCIUM 8.8* 7.8* 8.1* 8.0* 7.8* 7.8* 7.9* 8.0*  MG  --  2.1  --   --   --  2.1  --   --   PHOS  --   --  5.0*  --   --  5.9* 6.6* 5.8*  ALBUMIN 3.3* 2.2* 2.9*  --   --   --  2.3*  --     Liver Function Tests: Recent Labs  Lab 10/20/22 1238 10/21/22 0753 10/22/22 0606 10/26/22 0704  AST 15 11*  --   --   ALT  13 12  --   --   ALKPHOS 65 47  --   --   BILITOT 0.2* 0.5  --   --   PROT 5.2* 4.4*  --   --   ALBUMIN 3.3* 2.2* 2.9* 2.3*   Recent Labs  Lab 10/20/22 1238  LIPASE 31   No results for input(s): "AMMONIA" in the last 168 hours. CBC: Recent Labs    10/22/22 0606 10/23/22 0619 10/24/22 0307 10/25/22 0710 10/26/22 0705 10/27/22 0730  HGB 7.9* 7.8* 8.0* 8.6* 8.9* 8.4*  MCV 89.2 89.1 86.1 86.1 87.8 87.2  VITAMINB12 659  --   --   --   --   --   FOLATE 8.0  --   --   --   --   --   FERRITIN 204  --   --   --   --   --   TIBC 188*  --   --   --   --   --   IRON 76  --   --   --   --   --   RETICCTPCT 2.9  --   --   --   --   --     Cardiac Enzymes: Recent Labs  Lab 10/21/22 0752  CKTOTAL 57   CBG: Recent Labs  Lab 10/25/22 2125 10/26/22 0811 10/26/22 1557 10/26/22 2110 10/27/22 0804  GLUCAP 224* 310* 309* 325* 196*    Iron Studies:  No results for input(s): "IRON", "TIBC", "TRANSFERRIN", "FERRITIN" in the last 72 hours.  Studies/Results: DG CHEST PORT 1 VIEW  Result Date:  10/25/2022 CLINICAL DATA:  Status post insertion of the dialysis catheter. EXAM: PORTABLE CHEST 1 VIEW COMPARISON:  Chest radiograph 10/20/2022. FINDINGS: 1409 hours. Interval insertion of a right IJ approach dialysis catheter with tip projecting over the superior cavoatrial junction. Unchanged mild cardiomegaly. New moderate pulmonary edema with increasing small left and moderate right pleural effusions. Unchanged left basilar atelectasis. No pneumothorax. IMPRESSION: 1. Interval placement of a right IJ approach dialysis catheter with tip projecting over the superior cavoatrial junction. 2. New moderate pulmonary edema with increasing small left and moderate right pleural effusions. Electronically Signed   By: Emmit Alexanders M.D.   On: 10/25/2022 14:22    Medications: Infusions:    Scheduled Medications:  amLODipine  10 mg Oral Daily   Chlorhexidine Gluconate Cloth  6 each Topical Q0600   darbepoetin (ARANESP) injection - NON-DIALYSIS  60 mcg Subcutaneous Q Sat-1800   heparin  5,000 Units Subcutaneous Q8H   insulin aspart  0-5 Units Subcutaneous QHS   insulin aspart  0-9 Units Subcutaneous TID WC   insulin glargine-yfgn  16 Units Subcutaneous Daily   labetalol  100 mg Oral BID   pantoprazole  40 mg Oral BID   polyethylene glycol  17 g Oral BID   senna-docusate  1 tablet Oral BID    have reviewed scheduled and prn medications.  Physical Exam: General:NAD, comfortable Heart:RRR, s1s2 nl Lungs: Clear b/l,  Abdomen:soft, Non-tender, non-distended Extremities:No edema Neurology: Alert, awake and following commands. Sleepy but arousable ACCESS: R IJ TDC, L arm AVF + T/B  Chadd Tollison 10/27/2022,11:58 AM  LOS: 7 days

## 2022-10-27 NOTE — Progress Notes (Signed)
Contacted [facility] admissions to request an update on pt's referral. Awaiting response.   Melven Sartorius Renal Navigator [phone]

## 2022-10-28 LAB — RENAL FUNCTION PANEL
Albumin: 2.2 g/dL — ABNORMAL LOW (ref 3.5–5.0)
Anion gap: 11 (ref 5–15)
BUN: 46 mg/dL — ABNORMAL HIGH (ref 6–20)
CO2: 25 mmol/L (ref 22–32)
Calcium: 8.1 mg/dL — ABNORMAL LOW (ref 8.9–10.3)
Chloride: 99 mmol/L (ref 98–111)
Creatinine, Ser: 7.78 mg/dL — ABNORMAL HIGH (ref 0.61–1.24)
GFR, Estimated: 9 mL/min — ABNORMAL LOW (ref >60)
Glucose, Bld: 151 mg/dL — ABNORMAL HIGH (ref 70–99)
Phosphorus: 5.5 mg/dL — ABNORMAL HIGH (ref 2.5–4.6)
Potassium: 3.5 mmol/L (ref 3.5–5.1)
Sodium: 135 mmol/L (ref 135–145)

## 2022-10-28 LAB — CBC
HCT: 25.6 % — ABNORMAL LOW (ref 39.0–52.0)
Hemoglobin: 8.6 g/dL — ABNORMAL LOW (ref 13.0–17.0)
MCH: 30.1 pg (ref 26.0–34.0)
MCHC: 33.6 g/dL (ref 30.0–36.0)
MCV: 89.5 fL (ref 80.0–100.0)
Platelets: 380 10*3/uL (ref 150–400)
RBC: 2.86 MIL/uL — ABNORMAL LOW (ref 4.22–5.81)
RDW: 13.7 % (ref 11.5–15.5)
WBC: 13.1 10*3/uL — ABNORMAL HIGH (ref 4.0–10.5)
nRBC: 0 % (ref 0.0–0.2)

## 2022-10-28 LAB — GLUCOSE, CAPILLARY
Glucose-Capillary: 130 mg/dL — ABNORMAL HIGH (ref 70–99)
Glucose-Capillary: 94 mg/dL (ref 70–99)
Glucose-Capillary: 147 mg/dL — ABNORMAL HIGH (ref 70–99)

## 2022-10-28 MED ORDER — INSULIN GLARGINE-YFGN 100 UNIT/ML ~~LOC~~ SOLN
18.0000 [IU] | Freq: Every day | SUBCUTANEOUS | Status: DC
  Administered 2022-10-29: 18 [IU] via SUBCUTANEOUS
  Filled 2022-10-28: qty 0.18

## 2022-10-28 MED ORDER — HEPARIN SODIUM (PORCINE) 1000 UNIT/ML IJ SOLN
INTRAMUSCULAR | Status: AC
  Administered 2022-10-28: 1000 [IU]
  Filled 2022-10-28: qty 4

## 2022-10-28 NOTE — Progress Notes (Signed)
[facility] PROGRESS NOTE   John [name] [id number] DOB: 08-07-1991 DOA: 10/20/2022  PCP: Charlott Rakes, MD  Brief History/Interval Summary:   32 year old Caucasian male history of type 1 diabetes since the age of 84, presented to the ER with a 1-2 year history of worsening back pain, abdominal pain.  Patient has been without insurance for a while.  Has been buying his NovoLog and Lantus insulin from the pharmacy.  In the emergency department blood work showed acute kidney injury.  He was hospitalized for further management.   Consultants:  Nephrology.   Vascular surgery     Subjective/Interval History:  No significant events overnight, he reports appetite is improving.  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  Presented with a creatinine of 6.69. - He does admit to long-term NSAID use.  UA does show proteinuria. - CT of the abdomen pelvis does not show any hydronephrosis. - Nephrology was consulted.  Patient was given IV fluids and then placed on bicarbonate infusion. - Vascular surgery placed [facility], [place] 10/25/2022 - started hemodialysis on 3/6, management per renal.  Cough with abnormal chest x-ray/leukocytosis/possible aspiration pneumonia Chest x-ray suggested atelectasis versus opacities.  Patient did have a cough. COVID-19 influenza and RSV PCR's were negative. There was concern for aspiration pneumonia.  Patient started on Unasyn.   Symptoms have improved.  Initial plan was for a 5-day course.  WBC noted to be elevated this morning.  -Spiked fever 101.6 3/5, no recurrence since, continue to monitor, if he spikes again will need repeat blood cultures. .  Will follow he remains afebrile. Continue with incentive spirometry.  Sinus tachycardia/bilateral pleural effusion/small pericardial effusion Likely due to acute illness.  Chest x-ray and CT of the  abdomen pelvis does suggest that he has moderate bilateral pleural effusion and small pericardial effusion.  These are most likely due to renal failure and hypoalbuminemia.  He was given albumin infusions. TSH was normal.  Patient started on labetalol for high blood pressure.   Heart rate seems to be better.  Echocardiogram showed normal systolic function.  No significant valvular abnormalities.  Small circumferential pericardial effusion was noted. -Volume status should improve with dialysis  Abdominal pain with nausea Likely multifactorial.  Lipase level noted to be normal.  LFTs unremarkable.  No concerning findings on CT of the abdomen and pelvis.  Continue with PPI for now.  Nausea appears to be better.  Diabetes mellitus type 1, uncontrolled with hyperglycemia HbA1c is 6.5.  Continue glargine and SSI.  Monitor CBGs.  Seen by diabetes coordinator. CBG remains poorly controlled, overall CBG well-controlled with some lower readings today, so we will decrease his Semglee to 18 units, continue with sliding scale and 3 units before meals.    Normocytic anemia Drop in hemoglobin is likely dilutional.  No overt bleeding has been noted.  Anemia panel reviewed.  No deficiencies identified.  His renal failure is likely contributing.  Procrit and IV iron per renal  Essential hypertension Blood pressures have been poorly controlled.  Patient was started on amlodipine and labetalol.  Improvement noted.  Continue to monitor for now.  Hydralazine as needed.   Will see his blood pressure trends when he is started on dialysis.  Visual impairment Started in November.  Apparently has been seen by retina specialist.  He is supposed to follow-up for further interventions.  All of this is likely  driven by his diabetes and hypertension.  Tobacco abuse Counseled.  DVT Prophylaxis: Subcutaneous heparin Code Status: Full code Family Communication: None at bedside Disposition Plan: To be determined  Status  is: Inpatient Remains inpatient appropriate because: Acute kidney injury      Medications: Scheduled:  amLODipine  10 mg Oral Daily   Chlorhexidine Gluconate Cloth  6 each Topical Q0600   darbepoetin (ARANESP) injection - NON-DIALYSIS  60 mcg Subcutaneous Q Sat-1800   heparin  5,000 Units Subcutaneous Q8H   insulin aspart  0-5 Units Subcutaneous QHS   insulin aspart  0-9 Units Subcutaneous TID WC   insulin aspart  3 Units Subcutaneous TID WC   insulin glargine-yfgn  20 Units Subcutaneous Daily   labetalol  100 mg Oral BID   pantoprazole  40 mg Oral BID   polyethylene glycol  17 g Oral BID   senna-docusate  1 tablet Oral BID   Continuous:   [id number] **OR** acetaminophen, hydrALAZINE, methocarbamol, nicotine polacrilex, ondansetron **OR** ondansetron (ZOFRAN) IV, oxyCODONE, polyethylene glycol **FOLLOWED BY** [START ON 10/29/2022] polyethylene glycol  Antibiotics: Anti-infectives (From admission, onward)    Start     Dose/Rate Route Frequency Ordered Stop   10/25/22 0845  ceFAZolin (ANCEF) IVPB 2g/100 mL premix        2 g 200 mL/hr over 30 Minutes Intravenous  Once 10/25/22 0839 10/25/22 0939   10/25/22 0840  ceFAZolin (ANCEF) 2-4 GM/100ML-% IVPB       Note to Pharmacy: [facility], [place]: cabinet override      10/25/22 0840 10/25/22 0924   10/21/22 1145  Ampicillin-Sulbactam (UNASYN) 3 g in sodium chloride 0.9 % 100 mL IVPB        3 g 200 mL/hr over 30 Minutes Intravenous Every 12 hours 10/21/22 1058 10/25/22 2216       Objective:  Vital Signs  Vitals:   10/28/22 1130 10/28/22 1144 10/28/22 1200 10/28/22 1213  BP: (!) 144/100   (!) 147/109  Pulse: 97   100  Resp: 10   13  Temp:  98.3 F (36.8 C)    TempSrc:  Oral    SpO2:      Weight:  92.1 kg 90.1 kg   Height:        Intake/Output Summary (Last 24 hours) at 10/28/2022 1350 Last data filed at 10/28/2022 1200 Gross per 24 hour  Intake 640 ml  Output 2000 ml  Net -1360 ml   Filed Weights   10/28/22 0833  10/28/22 1144 10/28/22 1200  Weight: 94 kg 92.1 kg 90.1 kg     Awake Alert, Oriented X 3, ill-appearing Symmetrical Chest wall movement, Good air movement bilaterally, CTAB RRR,No Gallops,Rubs or new Murmurs, No Parasternal Heave +ve B.Sounds, Abd Soft, No tenderness, No rebound - guarding or rigidity. No Cyanosis, Clubbing ,trace edema     Lab Results:  Data Reviewed: I have personally reviewed following labs and reports of the imaging studies  CBC: Recent Labs  Lab 10/24/22 0307 10/25/22 0710 10/26/22 0705 10/27/22 0730 10/28/22 0854  WBC 10.0 12.1* 17.4* 11.9* 13.1*  HGB 8.0* 8.6* 8.9* 8.4* 8.6*  HCT 23.0* 24.1* 25.9* 23.8* 25.6*  MCV 86.1 86.1 87.8 87.2 89.5  PLT 303 314 335 337 [id number]    Basic Metabolic Panel: Recent Labs  Lab 10/22/22 0606 10/23/22 0619 10/24/22 0307 10/25/22 0710 10/26/22 0704 10/27/22 0730 10/28/22 0853  NA 135   < > 134* 132* 132* 134* 135  K 4.0   < > 3.7 4.0 4.3 3.7  3.5  CL 105   < > 102 100 99 99 99  CO2 18*   < > 21* 20* 21* 24 25  GLUCOSE 190*   < > 175* 288* 312* 202* 151*  BUN 53*   < > 52* 53* 58* 44* 46*  CREATININE 6.73*   < > 7.29* 7.76* 8.69* 7.38* 7.78*  CALCIUM 8.1*   < > 7.8* 7.8* 7.9* 8.0* 8.1*  MG  --   --   --  2.1  --   --   --   PHOS 5.0*  --   --  5.9* 6.6* 5.8* 5.5*   < > = values in this interval not displayed.    GFR: Estimated Creatinine Clearance: 15.8 mL/min (A) (by C-G formula based on SCr of 7.78 mg/dL (H)).  Liver Function Tests: Recent Labs  Lab 10/22/22 0606 10/26/22 0704 10/28/22 0853  ALBUMIN 2.9* 2.3* 2.2*    No results for input(s): "LIPASE", "AMYLASE" in the last 168 hours.   CBG: Recent Labs  Lab 10/27/22 0804 10/27/22 1143 10/27/22 1727 10/27/22 2150 10/28/22 0748  GLUCAP 196* 159* 115* 98 130*    Lipid Profile: No results for input(s): "CHOL", "HDL", "LDLCALC", "TRIG", "CHOLHDL", "LDLDIRECT" in the last 72 hours.    Recent Results (from the past 240 hour(s))  Resp  panel by RT-PCR (RSV, Flu A&B, Covid) Anterior Nasal Swab     Status: None   Collection Time: 10/20/22  3:22 PM   Specimen: Anterior Nasal Swab  Result Value Ref Range Status   SARS Coronavirus 2 by RT PCR NEGATIVE NEGATIVE Final    Comment: (NOTE) SARS-CoV-2 target nucleic acids are NOT DETECTED.  The SARS-CoV-2 RNA is generally detectable in upper respiratory specimens during the acute phase of infection. The lowest concentration of SARS-CoV-2 viral copies this assay can detect is 138 copies/mL. A negative result does not preclude SARS-Cov-2 infection and should not be used as the sole basis for treatment or other patient management decisions. A negative result may occur with  improper specimen collection/handling, submission of specimen other than nasopharyngeal swab, presence of viral mutation(s) within the areas targeted by this assay, and inadequate number of viral copies(<138 copies/mL). A negative result must be combined with clinical observations, patient history, and epidemiological information. The expected result is Negative.  Fact Sheet for Patients:  EntrepreneurPulse.com.au  Fact Sheet for Healthcare Providers:  IncredibleEmployment.be  This test is no t yet approved or cleared by the [place] FDA and  has been authorized for detection and/or diagnosis of SARS-CoV-2 by FDA under an Emergency Use Authorization (EUA). This EUA will remain  in effect (meaning this test can be used) for the duration of the COVID-19 declaration under Section 564(b)(1) of the Act, 21 U.S.C.section 360bbb-3(b)(1), unless the authorization is terminated  or revoked sooner.       Influenza A by PCR NEGATIVE NEGATIVE Final   Influenza B by PCR NEGATIVE NEGATIVE Final    Comment: (NOTE) The Xpert Xpress SARS-CoV-2/FLU/RSV plus assay is intended as an aid in the diagnosis of influenza from Nasopharyngeal swab specimens and should not be used as a  sole basis for treatment. Nasal washings and aspirates are unacceptable for Xpert Xpress SARS-CoV-2/FLU/RSV testing.  Fact Sheet for Patients: EntrepreneurPulse.com.au  Fact Sheet for Healthcare Providers: IncredibleEmployment.be  This test is not yet approved or cleared by the [place] FDA and has been authorized for detection and/or diagnosis of SARS-CoV-2 by FDA under an Emergency Use Authorization (EUA). This EUA  will remain in effect (meaning this test can be used) for the duration of the COVID-19 declaration under Section 564(b)(1) of the Act, 21 U.S.C. section 360bbb-3(b)(1), unless the authorization is terminated or revoked.     Resp Syncytial Virus by PCR NEGATIVE NEGATIVE Final    Comment: (NOTE) Fact Sheet for Patients: EntrepreneurPulse.com.au  Fact Sheet for Healthcare Providers: IncredibleEmployment.be  This test is not yet approved or cleared by the [place] FDA and has been authorized for detection and/or diagnosis of SARS-CoV-2 by FDA under an Emergency Use Authorization (EUA). This EUA will remain in effect (meaning this test can be used) for the duration of the COVID-19 declaration under Section 564(b)(1) of the Act, 21 U.S.C. section 360bbb-3(b)(1), unless the authorization is terminated or revoked.  Performed at [facility], [address], [place], [place] [id number]       Radiology Studies: No results found.     LOS: 8 days   Phillips Climes MD  [facility] Pager on [email or web address]  10/28/2022, 1:50 PM

## 2022-10-28 NOTE — Procedures (Signed)
Patient seen and examined on Hemodialysis. The procedure was supervised and I have made appropriate changes. BP (!) 144/100   Pulse 97   Temp 98.3 F (36.8 C) (Oral)   Resp 10   Ht '5\' 10"'$  (1.778 m)   Wt 90.1 kg   SpO2 92%   BMI 28.50 kg/m   QB 300 mL/ min via TDC, UF goal 2L  Tolerating treatment without complaints at this time.   Madelon Lips MD [place] Pgr [phone] 12:03 PM

## 2022-10-28 NOTE — Progress Notes (Signed)
OT Cancellation Note  Patient Details Name: John [name] MRN: [id number] DOB: 12-27-1990   Cancelled Treatment:    Reason Eval/Treat Not Completed: Patient at procedure or test/ unavailable (Pt in HD)  Malka So 10/28/2022, 8:48 AM Cleta Alberts, OTR/L [place] Office: [phone]

## 2022-10-28 NOTE — Evaluation (Signed)
Physical Therapy Evaluation Patient Details Name: PARISH PEPITONE MRN: [id number] DOB: 29-Dec-1990 Today's Date: 10/28/2022  History of Present Illness  Pt is a 32 y/o M admitted to [facility] on 2/29 from [facility] for abdominal pain and intractable nausea and vomiting. Chest x-ray revealing small bilateral pleural effusions and blood work revealing AKI. R IJ dialysis catheter placed on 3/5 and HD started on 3/6. PMHx: DM, back pain, tobacco abuse, visual impairment since November.  Clinical Impression  Pt presents today with impaired mobility due to scrotal swelling. Pt educated on use of a pillow case for support, pt able to utilize and reporting improved support and ease for mobility. Pt with good strength in BLE, reporting no concerns with mobility upon discharge and having full to near full time support at home. Pt with 1 LOB during ambulation but able to self-correct. Cued to take his time and provided mild cues for visual deficits, pt ambulating without LOB and only mild path sway, supervision provided for safety. Pt reports no concerns with mobility upon discharge or further needs for PT. Educated pt on OT being able to supply a scrotal sling, agreeing he would benefit from that. Encouraged pt to continue to mobilize during admission and to request PT if he feels his mobility declines. Acute PT will sign off at this time as pt reports no further needs, no current PT needs upon discharge identified at this time.        Recommendations for follow up therapy are one component of a multi-disciplinary discharge planning process, led by the attending physician.  Recommendations may be updated based on patient status, additional functional criteria and insurance authorization.  Follow Up Recommendations No PT follow up      Assistance Recommended at Discharge Intermittent Supervision/Assistance  Patient can return home with the following  Help with stairs or ramp for entrance;Assist for  transportation    Equipment Recommendations None recommended by PT  Recommendations for Other Services       Functional Status Assessment Patient has had a recent decline in their functional status and demonstrates the ability to make significant improvements in function in a reasonable and predictable amount of time.     Precautions / Restrictions Precautions Precautions: Fall;Other (comment) Precaution Comments: scrotal swelling Restrictions Weight Bearing Restrictions: No      Mobility  Bed Mobility Overal bed mobility: Modified Independent             General bed mobility comments: HOB elevated but pt performing without assist    Transfers Overall transfer level: Modified independent Equipment used: None               General transfer comment: performing sit<>stand trials with no assist, no sway    Ambulation/Gait Ambulation/Gait assistance: Supervision Gait Distance (Feet): 40 Feet Assistive device: None Gait Pattern/deviations: Step-through pattern, Drifts right/left Gait velocity: WFL     General Gait Details: one LOB noted but pt reporting due to visual deficits, ambulating again with cues to take his time, mild path deviation noted but no other LOB  Stairs            Wheelchair Mobility    Modified Rankin (Stroke Patients Only)       Balance Overall balance assessment: Needs assistance Sitting-balance support: No upper extremity supported, Feet supported Sitting balance-Leahy Scale: Normal     Standing balance support: No upper extremity supported, During functional activity Standing balance-Leahy Scale: Fair Standing balance comment: 1 LOB noted but pt self-correcting,  mild sway but no UE support                             Pertinent Vitals/Pain Pain Assessment Pain Assessment: Faces Faces Pain Scale: Hurts a little bit Pain Location: scrotum Pain Descriptors / Indicators: Discomfort Pain Intervention(s):  Limited activity within patient's tolerance, Monitored during session, Repositioned    Home Living Family/patient expects to be discharged to:: Private residence Living Arrangements: Spouse/significant other Available Help at Discharge: Family;Available PRN/intermittently Type of Home: House Home Access: Stairs to enter Entrance Stairs-Rails: None Entrance Stairs-Number of Steps: 3   Home Layout: One level Home Equipment: None Additional Comments: Pt's wife works but he reports she has a flexible schedule and is available a majority of the time    Prior Function Prior Level of Function : Independent/Modified Independent             Mobility Comments: independent with all mobility, denying any falls       Hand Dominance   Dominant Hand: Right    Extremity/Trunk Assessment   Upper Extremity Assessment Upper Extremity Assessment: Defer to OT evaluation    Lower Extremity Assessment Lower Extremity Assessment: Overall WFL for tasks assessed    Cervical / Trunk Assessment Cervical / Trunk Assessment: Normal  Communication   Communication: No difficulties  Cognition Arousal/Alertness: Awake/alert Behavior During Therapy: Impulsive Overall Cognitive Status: Within Functional Limits for tasks assessed                                 General Comments: A&Ox4, mildly impulsive        General Comments General comments (skin integrity, edema, etc.): HR in 100s with room mobility, pt with visual deficits, reporting vision focuses and unfocuses    Exercises     Assessment/Plan    PT Assessment Patient does not need any further PT services  PT Problem List         PT Treatment Interventions      PT Goals (Current goals can be found in the Care Plan section)  Acute Rehab PT Goals Patient Stated Goal: go home PT Goal Formulation: All assessment and education complete, DC therapy    Frequency       Co-evaluation               AM-PAC PT  "6 Clicks" Mobility  Outcome Measure Help needed turning from your back to your side while in a flat bed without using bedrails?: None Help needed moving from lying on your back to sitting on the side of a flat bed without using bedrails?: None Help needed moving to and from a bed to a chair (including a wheelchair)?: None Help needed standing up from a chair using your arms (e.g., wheelchair or bedside chair)?: None Help needed to walk in hospital room?: A Little Help needed climbing 3-5 steps with a railing? : A Little 6 Click Score: 22    End of Session   Activity Tolerance: Patient tolerated treatment well Patient left: in bed;with call bell/phone within reach;with family/visitor present Nurse Communication: Mobility status PT Visit Diagnosis: Difficulty in walking, not elsewhere classified (R26.2)    Time: 1426-1440 PT Time Calculation (min) (ACUTE ONLY): 14 min   Charges:   PT Evaluation $PT Eval Low Complexity: 1 Low          Charlynne Cousins, PT DPT Acute Rehabilitation Services  Office [place] 10/28/2022, 4:15 PM

## 2022-10-28 NOTE — Progress Notes (Signed)
Received patient in bed to unit.  Alert and oriented.  Informed consent signed and in chart.   TX duration:3 hours  Patient tolerated well.  Transported back to the room  Alert, without acute distress.  Hand-off given to patient's nurse.   Access used: catheter Access issues: none  Total UF removed: 2 L Medication(s) given: none Post HD VS: 145/90 P 80 R 18. O2 sat 99 % in room air. Post HD weight: 90.1 KG   [facility]

## 2022-10-28 NOTE — Progress Notes (Addendum)
Pt accepted at Transitional Care Unit at [facility]) on Monday,Tuesday,Thursday,Friday 11:30 chair time. Pt can start on Monday (3/11) and will need to arrive at 11:00 for paperwork prior to treatment. Spoke to pt's significant other via phone to discuss details. Significant other agreeable to plan. Will attempt to meet with pt after HD is completed to provide schedule letter and details. Arrangements added to AVS as well. Update provided to attending and nephrologist. Contacted renal NP regarding clinic's need for orders in the event pt is d/c to home over the weekend. Will assist as needed.   Melven Sartorius Renal Navigator [phone]  Addendum at 1:42 pm: Met with pt and pt's mother at bedside to discuss out-pt arrangements and provide schedule letter. Pt agreeable to plan.

## 2022-10-28 NOTE — Progress Notes (Signed)
Patient back from hemo treatment. Patient denies complaints. Patient to order lunch.

## 2022-10-28 NOTE — Progress Notes (Signed)
[facility] NEPHROLOGY PROGRESS NOTE  Assessment/ Plan: Pt is a 32 y.o. yo male  with history of uncontrolled type 1 diabetes, CKD presented to the ER with worsening body pain associated with nausea and vomiting seen as a consultation for worsening renal failure.   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  He may have some component of AKI due to GI loss/decreased oral intake and NSAIDs use.  The last creatinine level was around 1.3 in 2021 and no results available to review from 2021-2024.  He does have uncontrolled diabetes and hypertension in between.  UA with chronic proteinuria.  CT scan ruled out hydronephrosis. Treating with IV fluid with mildly increased urine output however continue to worsen renal function.   - will need to start dialysis this admission  - VVS placed [facility] and AVF 10/25/22- appreciate assistance  - HD #1 10/26/22  - CLIPto TCU  - HD #2 10/28/22  - will place OT consult for scrotal swelling   # Hypertension: Amlodipine increased to 10 mg and added labetalol.  Monitor blood pressure and heart rate.  Getting echo.    # Cough/possibly aspiration: s/p unasyn   # Anemia of CKD: Iron saturation 41%, serum iron 76.  Treated with a dose of IV iron and Aranesp on 3/2.      # CKD-MBD: Pending PTH level.monitor calcium and phosphorus level.    # Dispo" OK to d/c from renal perspective  Subjective: Seen on dialysis- doing well with rx #2.  Getting CLIP'd to TCU.    Objective Vital signs in last 24 hours: Vitals:   10/28/22 1100 10/28/22 1130 10/28/22 1144 10/28/22 1200  BP: 129/77 (!) 144/100    Pulse: 99 97    Resp: 12 10    Temp:   98.3 F (36.8 C)   TempSrc:   Oral   SpO2: 92%     Weight:   92.1 kg 90.1 kg  Height:       Weight change: -0.6 kg  Intake/Output Summary (Last 24 hours) at 10/28/2022 1201 Last data filed at 10/28/2022 1144 Gross per 24 hour  Intake 400 ml   Output 2000 ml  Net -1600 ml       Labs: RENAL PANEL Recent Labs  Lab 10/22/22 0606 10/23/22 0619 10/24/22 0307 10/25/22 0710 10/26/22 0704 10/27/22 0730 10/28/22 0853  NA 135   < > 134* 132* 132* 134* 135  K 4.0   < > 3.7 4.0 4.3 3.7 3.5  CL 105   < > 102 100 99 99 99  CO2 18*   < > 21* 20* 21* 24 25  GLUCOSE 190*   < > 175* 288* 312* 202* 151*  BUN 53*   < > 52* 53* 58* 44* 46*  CREATININE 6.73*   < > 7.29* 7.76* 8.69* 7.38* 7.78*  CALCIUM 8.1*   < > 7.8* 7.8* 7.9* 8.0* 8.1*  MG  --   --   --  2.1  --   --   --   PHOS 5.0*  --   --  5.9* 6.6* 5.8* 5.5*  ALBUMIN 2.9*  --   --   --  2.3*  --  2.2*   < > = values in this interval not displayed.    Liver Function Tests: Recent Labs  Lab 10/22/22 0606 10/26/22 0704 10/28/22 0853  ALBUMIN 2.9* 2.3* 2.2*   No results for input(s): "LIPASE", "AMYLASE" in  the last 168 hours.  No results for input(s): "AMMONIA" in the last 168 hours. CBC: Recent Labs    10/22/22 0606 10/23/22 0619 10/24/22 0307 10/25/22 0710 10/26/22 0705 10/27/22 0730 10/28/22 0854  HGB 7.9*   < > 8.0* 8.6* 8.9* 8.4* 8.6*  MCV 89.2   < > 86.1 86.1 87.8 87.2 89.5  VITAMINB12 659  --   --   --   --   --   --   FOLATE 8.0  --   --   --   --   --   --   FERRITIN 204  --   --   --   --   --   --   TIBC 188*  --   --   --   --   --   --   IRON 76  --   --   --   --   --   --   RETICCTPCT 2.9  --   --   --   --   --   --    < > = values in this interval not displayed.    Cardiac Enzymes: No results for input(s): "CKTOTAL", "CKMB", "CKMBINDEX", "TROPONINI" in the last 168 hours.  CBG: Recent Labs  Lab 10/27/22 0804 10/27/22 1143 10/27/22 1727 10/27/22 2150 10/28/22 0748  GLUCAP 196* 159* 115* 98 130*    Iron Studies:  No results for input(s): "IRON", "TIBC", "TRANSFERRIN", "FERRITIN" in the last 72 hours.  Studies/Results: No results found.  Medications: Infusions:    Scheduled Medications:  amLODipine  10 mg Oral Daily    Chlorhexidine Gluconate Cloth  6 each Topical Q0600   darbepoetin (ARANESP) injection - NON-DIALYSIS  60 mcg Subcutaneous Q Sat-1800   heparin  5,000 Units Subcutaneous Q8H   insulin aspart  0-5 Units Subcutaneous QHS   insulin aspart  0-9 Units Subcutaneous TID WC   insulin aspart  3 Units Subcutaneous TID WC   insulin glargine-yfgn  20 Units Subcutaneous Daily   labetalol  100 mg Oral BID   pantoprazole  40 mg Oral BID   polyethylene glycol  17 g Oral BID   senna-docusate  1 tablet Oral BID    have reviewed scheduled and prn medications.  Physical Exam: General:NAD, comfortable Heart:RRR, s1s2 nl Lungs: Clear b/l,  Abdomen:soft, Non-tender, non-distended Extremities:No edema Neurology: Alert, awake and following commands. Sleepy but arousable ACCESS: R IJ TDC, L arm AVF + T/B  Zoriana Oats 10/28/2022,12:01 PM  LOS: 8 days

## 2022-10-29 DIAGNOSIS — N186 End stage renal disease: Secondary | ICD-10-CM

## 2022-10-29 LAB — BASIC METABOLIC PANEL
Anion gap: 8 (ref 5–15)
BUN: 23 mg/dL — ABNORMAL HIGH (ref 6–20)
CO2: 28 mmol/L (ref 22–32)
Calcium: 7.8 mg/dL — ABNORMAL LOW (ref 8.9–10.3)
Chloride: 100 mmol/L (ref 98–111)
Creatinine, Ser: 5.72 mg/dL — ABNORMAL HIGH (ref 0.61–1.24)
GFR, Estimated: 13 mL/min — ABNORMAL LOW (ref >60)
Glucose, Bld: 181 mg/dL — ABNORMAL HIGH (ref 70–99)
Potassium: 3.3 mmol/L — ABNORMAL LOW (ref 3.5–5.1)
Sodium: 136 mmol/L (ref 135–145)

## 2022-10-29 LAB — CBC
HCT: 24.5 % — ABNORMAL LOW (ref 39.0–52.0)
Hemoglobin: 8.2 g/dL — ABNORMAL LOW (ref 13.0–17.0)
MCH: 30.1 pg (ref 26.0–34.0)
MCHC: 33.5 g/dL (ref 30.0–36.0)
MCV: 90.1 fL (ref 80.0–100.0)
Platelets: 370 10*3/uL (ref 150–400)
RBC: 2.72 MIL/uL — ABNORMAL LOW (ref 4.22–5.81)
RDW: 13.9 % (ref 11.5–15.5)
WBC: 10 10*3/uL (ref 4.0–10.5)
nRBC: 0 % (ref 0.0–0.2)

## 2022-10-29 LAB — GLUCOSE, CAPILLARY: Glucose-Capillary: 192 mg/dL — ABNORMAL HIGH (ref 70–99)

## 2022-10-29 MED ORDER — AMLODIPINE BESYLATE 10 MG PO TABS: 10.0000 mg | ORAL_TABLET | Freq: Every day | ORAL | 1 refills | Status: AC

## 2022-10-29 MED ORDER — PANTOPRAZOLE SODIUM 40 MG PO TBEC: 40.0000 mg | DELAYED_RELEASE_TABLET | Freq: Every day | ORAL | 0 refills | Status: DC

## 2022-10-29 MED ORDER — ACETAMINOPHEN 325 MG PO TABS: 650.0000 mg | ORAL_TABLET | Freq: Four times a day (QID) | ORAL | Status: DC | PRN

## 2022-10-29 MED ORDER — LABETALOL HCL 100 MG PO TABS: 100.0000 mg | ORAL_TABLET | Freq: Two times a day (BID) | ORAL | 1 refills | Status: AC

## 2022-10-29 NOTE — Discharge Summary (Signed)
Physician Discharge Summary  John [name] [id number] DOB: 1991/07/25 DOA: 10/20/2022  PCP: Charlott Rakes, MD  Admit date: 10/20/2022 Discharge date: 10/29/2022   Recommendations for Outpatient Follow-up:  Follow up with PCP in 1-2 weeks Please obtain BMP/CBC in one week    Discharge Condition: (Stable) CODE STATUS: (FULL) Diet recommendation: renal / Carb Modified 1200 cc fluid restrictions  Brief/Interim Summary:  32 year old Caucasian male history of type 1 diabetes since the age of 30, presented to the ER with a 1-2 year history of worsening back pain, abdominal pain.  Patient has been without insurance for a while.  Has been buying his NovoLog and Lantus insulin from the pharmacy.  In the emergency department blood work showed acute kidney injury.  He was hospitalized for further management.  He was started on HD during hospital stay.  Renal insufficiency/advanced renal failure> ESRD - The last labs we have in our system is from 2021 when he had a creatinine of 1.31.  Presented with a creatinine of 6.69. - He does admit to long-term NSAID use.  UA does show proteinuria. - CT of the abdomen pelvis does not show any hydronephrosis. - Nephrology was consulted.  Patient was given IV fluids and then placed on bicarbonate infusion without much improvement, has been made to initiate HD, he tolerated the first 2 sessions very well, he was offered HD over the weekend, but patient wants to go home where he can start his outpatient HD on Monday, he was cleared by renal.. - Vascular surgery placed [facility], DF 10/25/2022   Cough with abnormal chest x-ray/leukocytosis>> aspiration pneumonia Chest x-ray suggested atelectasis versus opacities.  Patient did have a cough. COVID-19 influenza and RSV PCR's were negative. There was concern for aspiration pneumonia.  Patient treated with  Unasyn.     Sinus tachycardia/bilateral pleural effusion/small pericardial effusion Likely due to acute illness.   Chest x-ray and CT of the abdomen pelvis does suggest that he has moderate bilateral pleural effusion and small pericardial effusion.  These are most likely due to renal failure and hypoalbuminemia.  He was given albumin infusions. TSH was normal.  Patient started on labetalol for high blood pressure.   Heart rate seems to be better.  Echocardiogram showed normal systolic function.  No significant valvular abnormalities.  Small circumferential pericardial effusion was noted. -Volume status should improve with dialysis   Abdominal pain with nausea Likely multifactorial.  Lipase level noted to be normal.  LFTs unremarkable.  No concerning findings on CT of the abdomen and pelvis.  Continue with PPI for now.  Nausea appears much improved after starting dialysis  Diabetes mellitus type 1, uncontrolled with hyperglycemia HbA1c is 6.5.  -Continue home regimen on discharge   Anemia of chronic kidney disease Drop in hemoglobin is likely dilutional.  No overt bleeding has been noted.  Anemia panel reviewed.  No deficiencies identified.  His renal failure is likely contributing.  Procrit and IV iron per renal   Essential hypertension Blood pressures have been poorly controlled.  Patient was started on amlodipine and labetalol.  They were provided at time of discharge   Visual impairment Started in November.  Apparently has been seen by retina specialist.  He is supposed to follow-up for further interventions.  All of this is likely driven by his diabetes and hypertension.   Tobacco abuse Counseled.   Discharge Diagnoses:  Principal Problem:   Acute renal failure (ARF) (HCC) Active Problems:   NSAID long-term use   Essential hypertension, benign  Tobacco abuse   Uncontrolled type 1 diabetes mellitus with hyperglycemia, with long-term current use of insulin [facility])    Discharge Instructions  Discharge Instructions     Diet - low sodium heart healthy   Complete by: As directed     Discharge instructions   Complete by: As directed    Follow with Primary MD Charlott Rakes, MD in 7 days   Get CBC, CMP, checked  by Primary MD next visit.   Disposition Home    Diet:Renal /carb modified with 1200 cc fluid restriction, with feeding assistance and aspiration precautions.    On your next visit with your primary care physician please Get Medicines reviewed and adjusted.   Please request your Prim.MD to go over all Hospital Tests and Procedure/Radiological results at the follow up, please get all Hospital records sent to your Prim MD by signing hospital release before you go home.   If you experience worsening of your admission symptoms, develop shortness of breath, life threatening emergency, suicidal or homicidal thoughts you must seek medical attention immediately by calling 911 or calling your MD immediately  if symptoms less severe.  You Must read complete instructions/literature along with all the possible adverse reactions/side effects for all the Medicines you take and that have been prescribed to you. Take any new Medicines after you have completely understood and accpet all the possible adverse reactions/side effects.   Do not drive when taking Pain medications.    Do not take more than prescribed Pain, Sleep and Anxiety Medications  Special Instructions: If you have smoked or chewed Tobacco  in the last 2 yrs please stop smoking, stop any regular Alcohol  and or any Recreational drug use.  Wear Seat belts while driving.   Please note  You were cared for by a hospitalist during your hospital stay. If you have any questions about your discharge medications or the care you received while you were in the hospital after you are discharged, you can call the unit and asked to speak with the hospitalist on call if the hospitalist that took care of you is not available. Once you are discharged, your primary care physician will handle any further medical issues.  Please note that NO REFILLS for any discharge medications will be authorized once you are discharged, as it is imperative that you return to your primary care physician (or establish a relationship with a primary care physician if you do not have one) for your aftercare needs so that they can reassess your need for medications and monitor your lab values.   Increase activity slowly   Complete by: As directed       Allergies as of 10/29/2022   No Known Allergies      Medication List     TAKE these medications    acetaminophen 325 MG tablet Commonly known as: TYLENOL Take 2 tablets (650 mg total) by mouth every 6 (six) hours as needed for mild pain (or Fever >/= 101).   amLODipine 10 MG tablet Commonly known as: NORVASC Take 1 tablet (10 mg total) by mouth daily.   glucose blood test strip Commonly known as: ONE TOUCH ULTRA TEST Use as instructed 3x daily   insulin glargine 100 UNIT/ML injection Commonly known as: LANTUS Inject 23 Units into the skin at bedtime.   Insulin Pen Needle 31G X 8 MM Misc 1 each by Does not apply route 4 (four) times daily -  with meals and at bedtime.   labetalol 100 MG  tablet Commonly known as: NORMODYNE Take 1 tablet (100 mg total) by mouth 2 (two) times daily.   NovoLOG 100 UNIT/ML injection Generic drug: insulin aspart Inject 5 Units into the skin in the morning, at noon, and at bedtime. Carb coverage and correction   pantoprazole 40 MG tablet Commonly known as: PROTONIX Take 1 tablet (40 mg total) by mouth daily.   TUMS PO Take 1 tablet by mouth 2 (two) times daily as needed (heartburn).   VISINE OP Place 2 drops into both eyes as needed (dry eye).        Follow-up Information     [facility] Follow up.   Why: 4-6 weeks. The office will call the patient with an appointment (sent) Contact information: [address] [place] [id number] [phone]        Gildardo Pounds, NP Follow up.    Specialty: Nurse Practitioner Why: TIME : 2:30 PM DATE: [facility] Contact information: [place] [address] [place] [address] [place], [facility]. Go on 10/31/2022.   Why: Transitional Care Unit- Monday,Tuesday,Thursday, Friday with 11:30 chair time.  Please arrive at 11:00 am for paperwork prior to treatment. Contact information: [address] [place] [address] [phone]                No Known Allergies  Consultations: Renal Vascular surgery   Procedures/Studies: DG CHEST PORT 1 VIEW  Result Date: 10/25/2022 CLINICAL DATA:  Status post insertion of the dialysis catheter. EXAM: PORTABLE CHEST 1 VIEW COMPARISON:  Chest radiograph 10/20/2022. FINDINGS: 1409 hours. Interval insertion of a right IJ approach dialysis catheter with tip projecting over the superior cavoatrial junction. Unchanged mild cardiomegaly. New moderate pulmonary edema with increasing small left and moderate right pleural effusions. Unchanged left basilar atelectasis. No pneumothorax. IMPRESSION: 1. Interval placement of a right IJ approach dialysis catheter with tip projecting over the superior cavoatrial junction. 2. New moderate pulmonary edema with increasing small left and moderate right pleural effusions. Electronically Signed   By: Emmit Alexanders M.D.   On: 10/25/2022 14:22   DG C-Arm 1-60 Min-No Report  Result Date: 10/25/2022 Fluoroscopy was utilized by the requesting physician.  No radiographic interpretation.   VAS [place] UPPER EXT VEIN MAPPING (PRE-OP AVF)  Result Date: 10/24/2022 UPPER EXTREMITY VEIN MAPPING Patient Name:  John [name]  Date of Exam:   10/24/2022 Medical Rec #: [id number]        Accession #:    [id number] Date of Birth: 02-23-1991        Patient Gender: M Patient Age:   54 years Exam Location:  [facility] Procedure:      VAS [place] UPPER EXT VEIN MAPPING (PRE-OP AVF) Referring Phys: Aldona Bar RHYNE  --------------------------------------------------------------------------------  Indications: Pre-op possible AVF placement, acute kidney failure. Limitations: Bandage and IV placement left AC fossa area. Comparison Study: No prior study. Performing Technologist: McKayla Maag RVT, [place]  Examination Guidelines: A complete evaluation includes B-mode imaging, spectral Doppler, color Doppler, and power Doppler as needed of all accessible portions of each vessel. Bilateral testing is considered an integral part of a complete examination. Limited examinations for reoccurring indications may be performed as noted. +-----------------+-------------+----------+---------+ Right Cephalic   Diameter (cm)Depth (cm)Findings  +-----------------+-------------+----------+---------+ Shoulder             0.32        0.58             +-----------------+-------------+----------+---------+  Prox upper arm       0.44        0.47             +-----------------+-------------+----------+---------+ Mid upper arm        0.37        0.28             +-----------------+-------------+----------+---------+ Dist upper arm       0.38        0.36             +-----------------+-------------+----------+---------+ Antecubital fossa    0.30        0.23   branching +-----------------+-------------+----------+---------+ Prox forearm         0.26        0.37             +-----------------+-------------+----------+---------+ Mid forearm          0.17        0.38   branching +-----------------+-------------+----------+---------+ Dist forearm         0.32        0.27             +-----------------+-------------+----------+---------+ Wrist                0.28        0.23             +-----------------+-------------+----------+---------+ +-----------------+-------------+----------+---------+ Right Basilic    Diameter (cm)Depth (cm)Findings  +-----------------+-------------+----------+---------+ Prox upper arm        0.71        0.78             +-----------------+-------------+----------+---------+ Mid upper arm        0.42        0.81             +-----------------+-------------+----------+---------+ Dist upper arm       0.48        0.76             +-----------------+-------------+----------+---------+ Antecubital fossa    0.26        0.38             +-----------------+-------------+----------+---------+ Prox forearm         0.33        0.37             +-----------------+-------------+----------+---------+ Mid forearm          0.28        0.29   branching +-----------------+-------------+----------+---------+ Distal forearm       0.26        0.32             +-----------------+-------------+----------+---------+ Wrist                0.17        0.51             +-----------------+-------------+----------+---------+ +-----------------+-------------+----------+-----------------------------------+ Left Cephalic    Diameter (cm)Depth (cm)             Findings               +-----------------+-------------+----------+-----------------------------------+ Shoulder             0.43        0.87                                       +-----------------+-------------+----------+-----------------------------------+ Prox upper arm  0.30        0.47                                       +-----------------+-------------+----------+-----------------------------------+ Mid upper arm        0.27        0.23                                       +-----------------+-------------+----------+-----------------------------------+ Dist upper arm       0.34        0.20                                       +-----------------+-------------+----------+-----------------------------------+ Antecubital fossa                        Not visualized due to bandage and                                                     IV placement              +-----------------+-------------+----------+-----------------------------------+ Prox forearm                             Not visualized due to bandage and                                                     IV placement             +-----------------+-------------+----------+-----------------------------------+ Mid forearm          0.32        0.41                                       +-----------------+-------------+----------+-----------------------------------+ Dist forearm         0.36        0.24                                       +-----------------+-------------+----------+-----------------------------------+ Wrist                0.31        0.54                                       +-----------------+-------------+----------+-----------------------------------+ +-----------------+-------------+----------+---------+ Left Basilic     Diameter (cm)Depth (cm)Findings  +-----------------+-------------+----------+---------+ Prox upper arm       0.48        0.93             +-----------------+-------------+----------+---------+ Mid upper arm        0.48  1.34             +-----------------+-------------+----------+---------+ Dist upper arm       0.32        0.88   branching +-----------------+-------------+----------+---------+ Antecubital fossa    0.43        0.46             +-----------------+-------------+----------+---------+ Prox forearm         0.31        0.61             +-----------------+-------------+----------+---------+ Mid forearm          0.35        0.53             +-----------------+-------------+----------+---------+ Distal forearm       0.29        0.51             +-----------------+-------------+----------+---------+ Wrist                0.12        0.62             +-----------------+-------------+----------+---------+ *See table(s) above for measurements and observations.  Diagnosing physician: Servando Snare MD  Electronically signed by Servando Snare MD on 10/24/2022 at 7:27:26 PM.    Final    ECHOCARDIOGRAM COMPLETE  Result Date: 10/24/2022    ECHOCARDIOGRAM REPORT   Patient Name:   John [name] Date of Exam: 10/24/2022 Medical Rec #:  [id number]       Height:       70.0 in Accession #:    [id number]      Weight:       191.8 lb Date of Birth:  [birth year]       BSA:          2.051 m Patient Age:    33 years        BP:           137/88 mmHg Patient Gender: M               HR:           99 bpm. Exam Location:  Inpatient Procedure: 2D Echo Indications:    dyspnea  History:        Patient has no prior history of Echocardiogram examinations.                 Risk Factors:Current Smoker and Hypertension.  Sonographer:    Harvie Junior Referring Phys: Bonnielee Haff IMPRESSIONS  1. Left ventricular ejection fraction, by estimation, is 55%. The left ventricle has low normal function. The left ventricle has no regional wall motion abnormalities. There is mild concentric left ventricular hypertrophy. Left ventricular diastolic parameters were normal.  2. Right ventricular systolic function is normal. The right ventricular size is normal. There is normal pulmonary artery systolic pressure.  3. Left atrial size was mildly dilated.  4. A small pericardial effusion is present. The pericardial effusion is circumferential.  5. Mild mitral valve regurgitation.  6. The aortic valve is tricuspid. Aortic valve regurgitation is not visualized.  7. The inferior vena cava is normal in size with greater than 50% respiratory variability, suggesting right atrial pressure of 3 mmHg. FINDINGS  Left Ventricle: Left ventricular ejection fraction, by estimation, is 55%. The left ventricle has low normal function. The left ventricle has no regional wall motion abnormalities. The left ventricular internal cavity size was normal in size. There is mild concentric left  ventricular hypertrophy. Left ventricular diastolic parameters were normal. Right Ventricle:  The right ventricular size is normal. Right vetricular wall thickness was not assessed. Right ventricular systolic function is normal. There is normal pulmonary artery systolic pressure. The tricuspid regurgitant velocity is 1.90 m/s, and with an assumed right atrial pressure of 3 mmHg, the estimated right ventricular systolic pressure is 0000000 mmHg. Left Atrium: Left atrial size was mildly dilated. Right Atrium: Right atrial size was normal in size. Pericardium: A small pericardial effusion is present. The pericardial effusion is circumferential. Mitral Valve: There is mild thickening of the mitral valve leaflet(s). Mild mitral annular calcification. Mild mitral valve regurgitation. Tricuspid Valve: The tricuspid valve is normal in structure. Tricuspid valve regurgitation is trivial. Aortic Valve: The aortic valve is tricuspid. Aortic valve regurgitation is not visualized. Aortic valve mean gradient measures 4.0 mmHg. Aortic valve peak gradient measures 6.5 mmHg. Aortic valve area, by VTI measures 2.56 cm. Pulmonic Valve: The pulmonic valve was normal in structure. Pulmonic valve regurgitation is not visualized. Aorta: The aortic root and ascending aorta are structurally normal, with no evidence of dilitation. Venous: The inferior vena cava is normal in size with greater than 50% respiratory variability, suggesting right atrial pressure of 3 mmHg. IAS/Shunts: No atrial level shunt detected by color flow Doppler.  LEFT VENTRICLE PLAX 2D LVIDd:         4.60 cm      Diastology LVIDs:         3.50 cm      LV e' medial:    7.62 cm/s LV PW:         1.20 cm      LV E/e' medial:  14.4 LV IVS:        1.20 cm      LV e' lateral:   11.90 cm/s LVOT diam:     2.00 cm      LV E/e' lateral: 9.2 LV SV:         53 LV SV Index:   26 LVOT Area:     3.14 cm                              3D Volume EF: LV Volumes (MOD)            3D EF:        45 % LV vol d, MOD A2C: 106.0 ml LV EDV:       169 ml LV vol d, MOD A4C: 120.0 ml LV ESV:        93 ml LV vol s, MOD A2C: 62.9 ml  LV SV:        76 ml LV vol s, MOD A4C: 56.3 ml LV SV MOD A2C:     43.1 ml LV SV MOD A4C:     120.0 ml LV SV MOD BP:      53.4 ml RIGHT VENTRICLE RV Basal diam:  2.90 cm RV Mid diam:    2.50 cm RV S prime:     16.30 cm/s TAPSE (M-mode): 2.3 cm LEFT ATRIUM             Index        RIGHT ATRIUM           Index LA diam:        3.70 cm 1.80 cm/m   RA Area:     10.90 cm LA Vol (A2C):   60.4 ml 29.46 ml/m  RA Volume:   21.00 ml  10.24 ml/m LA Vol (A4C):   69.4 ml 33.85 ml/m LA Biplane Vol: 65.5 ml 31.94 ml/m  AORTIC VALVE                    PULMONIC VALVE AV Area (Vmax):    2.50 cm     PV Vmax:       1.18 m/s AV Area (Vmean):   2.49 cm     PV Peak grad:  5.6 mmHg AV Area (VTI):     2.56 cm AV Vmax:           127.00 cm/s AV Vmean:          86.200 cm/s AV VTI:            0.209 m AV Peak Grad:      6.5 mmHg AV Mean Grad:      4.0 mmHg LVOT Vmax:         101.00 cm/s LVOT Vmean:        68.300 cm/s LVOT VTI:          0.170 m LVOT/AV VTI ratio: 0.81  AORTA Ao Root diam: 3.10 cm Ao Asc diam:  2.90 cm MITRAL VALVE                TRICUSPID VALVE MV Area (PHT): 3.85 cm     TR Peak grad:   14.4 mmHg MV Decel Time: 197 msec     TR Vmax:        190.00 cm/s MR Peak grad: 27.0 mmHg MR Vmax:      260.00 cm/s   SHUNTS MV E velocity: 110.00 cm/s  Systemic VTI:  0.17 m MV A velocity: 85.90 cm/s   Systemic Diam: 2.00 cm MV E/A ratio:  1.28 Dorris Carnes MD Electronically signed by Dorris Carnes MD Signature Date/Time: 10/24/2022/2:43:49 PM    Final    CT ABDOMEN PELVIS WO CONTRAST  Result Date: 10/21/2022 CLINICAL DATA:  Abdominal pain EXAM: CT ABDOMEN AND PELVIS WITHOUT CONTRAST TECHNIQUE: Multidetector CT imaging of the abdomen and pelvis was performed following the standard protocol without IV contrast. RADIATION DOSE REDUCTION: This exam was performed according to the departmental dose-optimization program which includes automated exposure control, adjustment of the mA and/or kV according to patient  size and/or use of iterative reconstruction technique. COMPARISON:  03/16/2015 FINDINGS: Lower chest: Moderate bilateral pleural effusions are seen. Small pericardial effusion is seen. Infiltrates are seen in the posterior aspects of both lower lung fields suggesting atelectasis/pneumonia. Hepatobiliary: No focal abnormalities are seen in liver. There is no dilation of bile ducts. Gallbladder is slightly distended. There is no wall thickening. Pancreas: No focal abnormalities are seen. Spleen: Unremarkable. Adrenals/Urinary Tract: Adrenals are unremarkable. There is no hydronephrosis. There are no renal or ureteral stones. Urinary bladder is unremarkable. Stomach/Bowel: Stomach is not distended. Small bowel loops are not dilated. Appendix is difficult to visualize. There is no pericecal inflammation. There is no significant wall thickening in colon. There is no pericolic stranding. Vascular/Lymphatic: No significant lymphadenopathy seen. Reproductive: Unremarkable. Other: Small ascites is present. There is subcutaneous edema. This may be due to anasarca. Musculoskeletal: No acute findings are seen. IMPRESSION: There is no evidence of intestinal obstruction or pneumoperitoneum. There is no hydronephrosis. Moderate bilateral pleural effusions. Small pericardial effusion. Small ascites. Linear patchy densities in both lower lung fields suggest atelectasis/pneumonia. Electronically Signed   By: Elmer Picker M.D.   On: 10/21/2022 12:56   DG  Chest Port 1 View  Result Date: 10/20/2022 CLINICAL DATA:  Chest pain EXAM: PORTABLE CHEST 1 VIEW COMPARISON:  05/22/2019 FINDINGS: Stable cardiomediastinal contours. Small bilateral pleural effusions are identified, left greater than right. These appear new from the previous exam. Bandlike opacities within the left lower lobe are new from the previous exam and may reflect areas of postinflammatory scarring or atelectasis. Retrocardiac opacification is also noted which may  represent atelectasis or airspace disease. IMPRESSION: 1. New small bilateral pleural effusions, left greater than right. 2. New bandlike opacities within the left lower lobe may reflect areas of postinflammatory scarring or atelectasis. 3. Retrocardiac opacification may represent atelectasis or airspace disease. Electronically Signed   By: Kerby Moors M.D.   On: 10/20/2022 13:20   Fluorescein Angiography Optos (Transit OD)  Result Date: 10/14/2022 Right Eye Progression has no prior data. Early phase findings include blockage, staining, microaneurysm, neovascularization disc, retinal neovascularization, vascular perfusion defect. Mid/Late phase findings include blockage, leakage, staining, microaneurysm, neovascularization disc, retinal neovascularization, vascular perfusion defect (Florid NVD with surrounding blockage; large patches of non-perfusion, +leakage from NV and MA). Left Eye Progression has no prior data. Early phase findings include microaneurysm, neovascularization disc, retinal neovascularization, vascular perfusion defect. Mid/Late phase findings include leakage, microaneurysm, neovascularization disc, retinal neovascularization, vascular perfusion defect (Flor[place] NVD with surrounding blockage; large patches of non-perfusion, +leakage from NV and MA). Notes Images stored on drive; Impression: Severe PDR OU OD: Florid NVD with surrounding blockage; large patches of non-perfusion, +leakage from NV and MA OS: Florid NVD and NVE w/ leakage; large patches of non-perfusion, leaking MA  OCT, Retina - OU - Both Eyes  Result Date: 10/14/2022 Right Eye Quality was good. Central Foveal Thickness: 787. Progression has no prior data. Findings include no SRF, abnormal foveal contour, intraretinal hyper-reflective material, epiretinal membrane, intraretinal fluid, macular pucker, vitreous traction (Massive central edema; vitreous traction emanating from disc). Left Eye Quality was good. Central Foveal  Thickness: 920. Progression has no prior data. Findings include abnormal foveal contour, epiretinal membrane, intraretinal fluid, macular pucker, subretinal fluid, vitreomacular adhesion (Central SRF/diabetic retinal detachment). Notes *Images captured and stored on drive Diagnosis / Impression: PDR with edema OU OD: Massive central edema; vitreous traction emanating from disc OS: Central SRF/diabetic retinal detachment; +edema Clinical management: See below Abbreviations: NFP - Normal foveal profile. CME - cystoid macular edema. PED - pigment epithelial detachment. IRF - intraretinal fluid. SRF - subretinal fluid. EZ - ellipsoid zone. ERM - epiretinal membrane. ORA - outer retinal atrophy. ORT - outer retinal tubulation. SRHM - subretinal hyper-reflective material. IRHM - intraretinal hyper-reflective material      Subjective: No significant events overnight, he denies any complaints today, eager to go home.  Discharge Exam: Vitals:   10/29/22 0451 10/29/22 0737  BP: (!) 143/96 (!) 164/109  Pulse: 98   Resp: 18   Temp: 98 F (36.7 C) 98.1 F (36.7 C)  SpO2:     Vitals:   10/29/22 0013 10/29/22 0451 10/29/22 0452 10/29/22 0737  BP: 124/82 (!) 143/96  (!) 164/109  Pulse: 98 98    Resp: 15 18    Temp: 98.7 F (37.1 C) 98 F (36.7 C)  98.1 F (36.7 C)  TempSrc: Oral Oral  Oral  SpO2:      Weight:   90.1 kg   Height:        General: Pt is alert, awake, not in acute distress, chronically ill-appearing,  poor vision. Cardiovascular: RRR, S1/S2 +, no rubs, no gallops Respiratory: CTA  bilaterally, no wheezing, no rhonchi Abdominal: Soft, NT, ND, bowel sounds + Extremities: Remedy +1 edema, scrotal edema present as well.    The results of significant diagnostics from this hospitalization (including imaging, microbiology, ancillary and laboratory) are listed below for reference.     Microbiology: Recent Results (from the past 240 hour(s))  Resp panel by RT-PCR (RSV, Flu A&B,  Covid) Anterior Nasal Swab     Status: None   Collection Time: 10/20/22  3:22 PM   Specimen: Anterior Nasal Swab  Result Value Ref Range Status   SARS Coronavirus 2 by RT PCR NEGATIVE NEGATIVE Final    Comment: (NOTE) SARS-CoV-2 target nucleic acids are NOT DETECTED.  The SARS-CoV-2 RNA is generally detectable in upper respiratory specimens during the acute phase of infection. The lowest concentration of SARS-CoV-2 viral copies this assay can detect is 138 copies/mL. A negative result does not preclude SARS-Cov-2 infection and should not be used as the sole basis for treatment or other patient management decisions. A negative result may occur with  improper specimen collection/handling, submission of specimen other than nasopharyngeal swab, presence of viral mutation(s) within the areas targeted by this assay, and inadequate number of viral copies(<138 copies/mL). A negative result must be combined with clinical observations, patient history, and epidemiological information. The expected result is Negative.  Fact Sheet for Patients:  EntrepreneurPulse.com.au  Fact Sheet for Healthcare Providers:  IncredibleEmployment.be  This test is no t yet approved or cleared by the [place] FDA and  has been authorized for detection and/or diagnosis of SARS-CoV-2 by FDA under an Emergency Use Authorization (EUA). This EUA will remain  in effect (meaning this test can be used) for the duration of the COVID-19 declaration under Section 564(b)(1) of the Act, 21 U.S.C.section 360bbb-3(b)(1), unless the authorization is terminated  or revoked sooner.       Influenza A by PCR NEGATIVE NEGATIVE Final   Influenza B by PCR NEGATIVE NEGATIVE Final    Comment: (NOTE) The Xpert Xpress SARS-CoV-2/FLU/RSV plus assay is intended as an aid in the diagnosis of influenza from Nasopharyngeal swab specimens and should not be used as a sole basis for treatment. Nasal  washings and aspirates are unacceptable for Xpert Xpress SARS-CoV-2/FLU/RSV testing.  Fact Sheet for Patients: EntrepreneurPulse.com.au  Fact Sheet for Healthcare Providers: IncredibleEmployment.be  This test is not yet approved or cleared by the [place] FDA and has been authorized for detection and/or diagnosis of SARS-CoV-2 by FDA under an Emergency Use Authorization (EUA). This EUA will remain in effect (meaning this test can be used) for the duration of the COVID-19 declaration under Section 564(b)(1) of the Act, 21 U.S.C. section 360bbb-3(b)(1), unless the authorization is terminated or revoked.     Resp Syncytial Virus by PCR NEGATIVE NEGATIVE Final    Comment: (NOTE) Fact Sheet for Patients: EntrepreneurPulse.com.au  Fact Sheet for Healthcare Providers: IncredibleEmployment.be  This test is not yet approved or cleared by the [place] FDA and has been authorized for detection and/or diagnosis of SARS-CoV-2 by FDA under an Emergency Use Authorization (EUA). This EUA will remain in effect (meaning this test can be used) for the duration of the COVID-19 declaration under Section 564(b)(1) of the Act, 21 U.S.C. section 360bbb-3(b)(1), unless the authorization is terminated or revoked.  Performed at [facility], [address] [place], [place] [address]      Labs: BNP (last 3 results) No results for input(s): "BNP" in the last 8760 hours. Basic Metabolic Panel: Recent  Labs  Lab 10/25/22 0710 10/26/22 0704 10/27/22 0730 10/28/22 0853 10/29/22 0316  NA 132* 132* 134* 135 136  K 4.0 4.3 3.7 3.5 3.3*  CL 100 99 99 99 100  CO2 20* 21* '24 25 28  '$ GLUCOSE 288* 312* 202* 151* 181*  BUN 53* 58* 44* 46* 23*  CREATININE 7.76* 8.69* 7.38* 7.78* 5.72*  CALCIUM 7.8* 7.9* 8.0* 8.1* 7.8*  MG 2.1  --   --   --   --   PHOS 5.9* 6.6* 5.8* 5.5*  --    Liver Function Tests: Recent  Labs  Lab 10/26/22 0704 10/28/22 0853  ALBUMIN 2.3* 2.2*   No results for input(s): "LIPASE", "AMYLASE" in the last 168 hours. No results for input(s): "AMMONIA" in the last 168 hours. CBC: Recent Labs  Lab 10/25/22 0710 10/26/22 0705 10/27/22 0730 10/28/22 0854 10/29/22 0316  WBC 12.1* 17.4* 11.9* 13.1* 10.0  HGB 8.6* 8.9* 8.4* 8.6* 8.2*  HCT 24.1* 25.9* 23.8* 25.6* 24.5*  MCV 86.1 87.8 87.2 89.5 90.1  PLT 314 335 337 380 370   Cardiac Enzymes: No results for input(s): "CKTOTAL", "CKMB", "CKMBINDEX", "TROPONINI" in the last 168 hours. BNP: Invalid input(s): "POCBNP" CBG: Recent Labs  Lab 10/27/22 2150 10/28/22 0748 10/28/22 1212 10/28/22 1515 10/29/22 0741  GLUCAP 98 130* 94 147* 192*   D-Dimer No results for input(s): "DDIMER" in the last 72 hours. Hgb A1c No results for input(s): "HGBA1C" in the last 72 hours. Lipid Profile No results for input(s): "CHOL", "HDL", "LDLCALC", "TRIG", "CHOLHDL", "LDLDIRECT" in the last 72 hours. Thyroid function studies No results for input(s): "TSH", "T4TOTAL", "T3FREE", "THYROIDAB" in the last 72 hours.  Invalid input(s): "FREET3" Anemia work up No results for input(s): "VITAMINB12", "FOLATE", "FERRITIN", "TIBC", "IRON", "RETICCTPCT" in the last 72 hours. Urinalysis    Component Value Date/Time   COLORURINE YELLOW 10/20/2022 1646   APPEARANCEUR CLEAR 10/20/2022 1646   LABSPEC 1.020 10/20/2022 1646   PHURINE 6.0 10/20/2022 1646   GLUCOSEU 250 (A) 10/20/2022 1646   HGBUR SMALL (A) 10/20/2022 1646   BILIRUBINUR NEGATIVE 10/20/2022 1646   KETONESUR NEGATIVE 10/20/2022 1646   PROTEINUR >=300 (A) 10/20/2022 1646   UROBILINOGEN 0.2 03/16/2015 1040   NITRITE NEGATIVE 10/20/2022 1646   LEUKOCYTESUR NEGATIVE 10/20/2022 1646   Sepsis Labs Recent Labs  Lab 10/26/22 0705 10/27/22 0730 10/28/22 0854 10/29/22 0316  WBC 17.4* 11.9* 13.1* 10.0   Microbiology Recent Results (from the past 240 hour(s))  Resp panel by RT-PCR  (RSV, Flu A&B, Covid) Anterior Nasal Swab     Status: None   Collection Time: 10/20/22  3:22 PM   Specimen: Anterior Nasal Swab  Result Value Ref Range Status   SARS Coronavirus 2 by RT PCR NEGATIVE NEGATIVE Final    Comment: (NOTE) SARS-CoV-2 target nucleic acids are NOT DETECTED.  The SARS-CoV-2 RNA is generally detectable in upper respiratory specimens during the acute phase of infection. The lowest concentration of SARS-CoV-2 viral copies this assay can detect is 138 copies/mL. A negative result does not preclude SARS-Cov-2 infection and should not be used as the sole basis for treatment or other patient management decisions. A negative result may occur with  improper specimen collection/handling, submission of specimen other than nasopharyngeal swab, presence of viral mutation(s) within the areas targeted by this assay, and inadequate number of viral copies(<138 copies/mL). A negative result must be combined with clinical observations, patient history, and epidemiological information. The expected result is Negative.  Fact Sheet for Patients:  EntrepreneurPulse.com.au  Fact Sheet for Healthcare Providers:  IncredibleEmployment.be  This test is no t yet approved or cleared by the [place] FDA and  has been authorized for detection and/or diagnosis of SARS-CoV-2 by FDA under an Emergency Use Authorization (EUA). This EUA will remain  in effect (meaning this test can be used) for the duration of the COVID-19 declaration under Section 564(b)(1) of the Act, 21 U.S.C.section 360bbb-3(b)(1), unless the authorization is terminated  or revoked sooner.       Influenza A by PCR NEGATIVE NEGATIVE Final   Influenza B by PCR NEGATIVE NEGATIVE Final    Comment: (NOTE) The Xpert Xpress SARS-CoV-2/FLU/RSV plus assay is intended as an aid in the diagnosis of influenza from Nasopharyngeal swab specimens and should not be used as a sole basis for  treatment. Nasal washings and aspirates are unacceptable for Xpert Xpress SARS-CoV-2/FLU/RSV testing.  Fact Sheet for Patients: EntrepreneurPulse.com.au  Fact Sheet for Healthcare Providers: IncredibleEmployment.be  This test is not yet approved or cleared by the [place] FDA and has been authorized for detection and/or diagnosis of SARS-CoV-2 by FDA under an Emergency Use Authorization (EUA). This EUA will remain in effect (meaning this test can be used) for the duration of the COVID-19 declaration under Section 564(b)(1) of the Act, 21 U.S.C. section 360bbb-3(b)(1), unless the authorization is terminated or revoked.     Resp Syncytial Virus by PCR NEGATIVE NEGATIVE Final    Comment: (NOTE) Fact Sheet for Patients: EntrepreneurPulse.com.au  Fact Sheet for Healthcare Providers: IncredibleEmployment.be  This test is not yet approved or cleared by the [place] FDA and has been authorized for detection and/or diagnosis of SARS-CoV-2 by FDA under an Emergency Use Authorization (EUA). This EUA will remain in effect (meaning this test can be used) for the duration of the COVID-19 declaration under Section 564(b)(1) of the Act, 21 U.S.C. section 360bbb-3(b)(1), unless the authorization is terminated or revoked.  Performed at [facility], [address] [place], [place] [address]      Time coordinating discharge: Over 30 minutes  SIGNED:   Phillips Climes, MD  [facility] 10/29/2022, 1:04 PM Pager   If 7PM-7AM, please contact night-coverage [email or web address]

## 2022-10-29 NOTE — Progress Notes (Signed)
[facility] NEPHROLOGY PROGRESS NOTE  Assessment/ Plan: Pt is a 32 y.o. yo male  with history of uncontrolled type 1 diabetes, CKD presented to the ER with worsening body pain associated with nausea and vomiting seen as a consultation for worsening renal failure.   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  He may have some component of AKI due to GI loss/decreased oral intake and NSAIDs use.  The last creatinine level was around 1.3 in 2021 and no results available to review from 2021-2024.  He does have uncontrolled diabetes and hypertension in between.  UA with chronic proteinuria.  CT scan ruled out hydronephrosis. Treating with IV fluid with mildly increased urine output however continue to worsen renal function.   - will need to start dialysis this admission  - VVS placed [facility] and AVF 10/25/22- appreciate assistance  - HD #1 10/26/22  - CLIPto TCU  - HD #2 10/28/22  - OK from renal perspective to d/c today   # Hypertension: Amlodipine increased to 10 mg and added labetalol.  Monitor blood pressure and heart rate.  Getting echo.    # Cough/possibly aspiration: s/p unasyn   # Anemia of CKD: Iron saturation 41%, serum iron 76.  Treated with a dose of IV iron and Aranesp on 3/2.      # CKD-MBD: Pending PTH level.monitor calcium and phosphorus level.    # Dispo" OK to d/c from renal perspective  Subjective: Seen in room.  Doing better.  Feels better.  Offered HD rx today- has declined, prefers to go home.    Objective Vital signs in last 24 hours: Vitals:   10/28/22 1945 10/29/22 0013 10/29/22 0451 10/29/22 0452  BP: (!) 148/95 124/82 (!) 143/96   Pulse: 98 98 98   Resp: '15 15 18   '$ Temp: 97.9 F (36.6 C) 98.7 F (37.1 C) 98 F (36.7 C)   TempSrc: Oral Oral Oral   SpO2:      Weight:    90.1 kg  Height:       Weight change: -3.6 kg  Intake/Output Summary (Last 24 hours) at 10/29/2022  0718 Last data filed at 10/28/2022 1700 Gross per 24 hour  Intake 440 ml  Output 2000 ml  Net -1560 ml       Labs: RENAL PANEL Recent Labs  Lab 10/25/22 0710 10/26/22 0704 10/27/22 0730 10/28/22 0853 10/29/22 0316  NA 132* 132* 134* 135 136  K 4.0 4.3 3.7 3.5 3.3*  CL 100 99 99 99 100  CO2 20* 21* '24 25 28  '$ GLUCOSE 288* 312* 202* 151* 181*  BUN 53* 58* 44* 46* 23*  CREATININE 7.76* 8.69* 7.38* 7.78* 5.72*  CALCIUM 7.8* 7.9* 8.0* 8.1* 7.8*  MG 2.1  --   --   --   --   PHOS 5.9* 6.6* 5.8* 5.5*  --   ALBUMIN  --  2.3*  --  2.2*  --     Liver Function Tests: Recent Labs  Lab 10/26/22 0704 10/28/22 0853  ALBUMIN 2.3* 2.2*   No results for input(s): "LIPASE", "AMYLASE" in the last 168 hours.  No results for input(s): "AMMONIA" in the last 168 hours. CBC: Recent Labs    10/22/22 0606 10/23/22 0619 10/25/22 0710 10/26/22 0705 10/27/22 0730 10/28/22 0854 10/29/22 0316  HGB 7.9*   < > 8.6* 8.9* 8.4* 8.6* 8.2*  MCV 89.2   < > 86.1 87.8 87.2 89.5 90.1  VITAMINB12 659  --   --   --   --   --   --   FOLATE 8.0  --   --   --   --   --   --   FERRITIN 204  --   --   --   --   --   --   TIBC 188*  --   --   --   --   --   --   IRON 76  --   --   --   --   --   --   RETICCTPCT 2.9  --   --   --   --   --   --    < > = values in this interval not displayed.    Cardiac Enzymes: No results for input(s): "CKTOTAL", "CKMB", "CKMBINDEX", "TROPONINI" in the last 168 hours.  CBG: Recent Labs  Lab 10/27/22 1727 10/27/22 2150 10/28/22 0748 10/28/22 1212 10/28/22 1515  GLUCAP 115* 98 130* 94 147*    Iron Studies:  No results for input(s): "IRON", "TIBC", "TRANSFERRIN", "FERRITIN" in the last 72 hours.  Studies/Results: No results found.  Medications: Infusions:    Scheduled Medications:  amLODipine  10 mg Oral Daily   Chlorhexidine Gluconate Cloth  6 each Topical Q0600   darbepoetin (ARANESP) injection - NON-DIALYSIS  60 mcg Subcutaneous Q Sat-1800    heparin  5,000 Units Subcutaneous Q8H   insulin aspart  0-5 Units Subcutaneous QHS   insulin aspart  0-9 Units Subcutaneous TID WC   insulin aspart  3 Units Subcutaneous TID WC   insulin glargine-yfgn  18 Units Subcutaneous Daily   labetalol  100 mg Oral BID   pantoprazole  40 mg Oral BID   polyethylene glycol  17 g Oral BID   senna-docusate  1 tablet Oral BID    have reviewed scheduled and prn medications.  Physical Exam: General:NAD, comfortable Heart:RRR, s1s2 nl Lungs: Clear b/l,  Abdomen:soft, Non-tender, non-distended Extremities:No edema Neurology: Alert, awake and following commands. Sleepy but arousable ACCESS: R IJ TDC, L arm AVF + T/B  Sherese Heyward 10/29/2022,7:18 AM  LOS: 9 days

## 2022-10-30 NOTE — TOC Transition Note (Signed)
Transition of Care - Initial Contact from Inpatient Facility  Date of discharge: 10/29/22 Date of contact: 10/30/22  Method: Phone Spoke to: Patient  Patient contacted to discuss transition of care from recent inpatient hospitalization. Patient was admitted to [facility] from 2/29-3/9 with discharge diagnosis of CKD-now ESRD, new start to hemodialysis.  The discharge medication list was reviewed. Patient understands the changes and has no concerns.   Patient will start HD at his outpatient HD unit on: Monday March 11th at [facility].  No other concerns at this time.   Tobie Poet, NP

## 2022-10-31 ENCOUNTER — Telehealth: Payer: Self-pay

## 2022-10-31 LAB — GLUCOSE, CAPILLARY: Glucose-Capillary: 113 mg/dL — ABNORMAL HIGH (ref 70–99)

## 2022-10-31 NOTE — Transitions of Care (Post Inpatient/ED Visit) (Signed)
   10/31/2022  Name: John [name] MRN: [id number] DOB: 1991-03-24  Today's TOC FU Call Status: Today's TOC FU Call Status:: Successful TOC FU Call Competed TOC FU Call Complete Date: 10/31/22  Transition Care Management Follow-up Telephone Call Date of Discharge: 10/29/22 Discharge Facility: [facility]) Type of Discharge: Inpatient Admission Primary Inpatient Discharge Diagnosis:: ARF How have you been since you were released from the hospital?: Better (The patient was at dialysis when I called. He spoke to me and then gave the phone to his significant other, John [name], to complete the call) Any questions or concerns?: No (they are just anxious to get him feeling better.)  Items Reviewed: Did you receive and understand the discharge instructions provided?: Yes Medications obtained and verified?: Yes (Medications Reviewed) (he said they have all of his medications and he did not have any questions about the med regime) Any new allergies since your discharge?: No Dietary orders reviewed?: Yes Type of Diet Ordered:: renal / carb modified, 1200 cc fluid restriction- John [name] said she was just receiving information about the diet and fluid restriction. Do you have support at home?: Yes People in Home: significant other Name of Support/Comfort Primary Source: [facility] and Equipment/Supplies: [place] Ordered?: No Any new equipment or medical supplies ordered?: No  Functional Questionnaire: Do you need assistance with bathing/showering or dressing?: No Do you need assistance with meal preparation?: Yes Do you need assistance with eating?: No Do you have difficulty maintaining continence: No Do you need assistance with getting out of bed/getting out of a chair/moving?: No (He is slow to ambulate due to the fluid retention in his lower extremities.) Do you have difficulty managing or taking your medications?: Yes (John [name] manages his med regime due to his low  vision.  She checks his  blood sugars, he has a working glucometer and she draws up the insulins for him to self administer.)  Folllow up appointments reviewed: PCP Follow-up appointment confirmed?: Yes Date of PCP follow-up appointment?: 11/09/22 Follow-up Provider: Geryl Rankins, NP Specialist Hospital Follow-up appointment confirmed?: Yes Date of Specialist follow-up appointment?: 12/07/22 Follow-Up Specialty Provider:: [facility] Do you need transportation to your follow-up appointment?: No Do you understand care options if your condition(s) worsen?: Yes-patient verbalized understanding    SIGNATURE Eden Lathe, RN

## 2022-10-31 NOTE — Progress Notes (Signed)
Late Note Entry  Pt d/c to home on Saturday. Contacted TCU at [facility] and spoke to [place], Therapist, sports. Clinic advised pt was d/c on weekend and should start today as planned. Clinic confirms orders received.   Melven Sartorius Renal Navigator [phone]

## 2022-11-09 ENCOUNTER — Ambulatory Visit: Payer: Commercial Managed Care - HMO | Admitting: Nurse Practitioner

## 2022-11-18 ENCOUNTER — Inpatient Hospital Stay: Payer: Self-pay | Admitting: Nurse Practitioner

## 2022-11-30 ENCOUNTER — Other Ambulatory Visit: Payer: Self-pay | Admitting: *Deleted

## 2022-11-30 DIAGNOSIS — N186 End stage renal disease: Secondary | ICD-10-CM

## 2022-12-05 ENCOUNTER — Inpatient Hospital Stay: Payer: Self-pay | Admitting: Family Medicine

## 2022-12-07 ENCOUNTER — Ambulatory Visit (HOSPITAL_COMMUNITY)
Admission: RE | Admit: 2022-12-07 | Discharge: 2022-12-07 | Disposition: A | Discharge status: Home / Self Care | Payer: Commercial Managed Care - HMO | Source: Ambulatory Visit | Attending: Vascular Surgery | Admitting: Vascular Surgery

## 2022-12-07 ENCOUNTER — Ambulatory Visit (INDEPENDENT_AMBULATORY_CARE_PROVIDER_SITE_OTHER): Payer: Commercial Managed Care - HMO | Admitting: Physician Assistant

## 2022-12-07 VITALS — BP 170/115 | HR 103 | Temp 98.2°F | Wt 175.0 lb

## 2022-12-07 DIAGNOSIS — N186 End stage renal disease: Secondary | ICD-10-CM

## 2022-12-07 NOTE — Progress Notes (Signed)
    Postoperative Access Visit   History of Present Illness   John [name] is a 32 y.o. year old male who presents for postoperative follow-up for: Right IJ TDC placement and creation of left brachial artery to cephalic vein fistula by Dr. Randie Heinz on 10/25/22  The patient's wounds are well healed.  The patient notes no steal symptoms.  The patient is able to complete their activities of daily living. He says he initially had some swelling in the arm and if he sleeps on his left arm he will wake up with some swelling but otherwise this has improved since surgery. He currently is dialyzing via right IJ TDC on Mon,Tues,Thurs,Friday at the [facility] location. He is hopeful to transition to peritoneal Dialysis. He is also on Transplant list and very hopeful to be recipient in the future.  Physical Examination   Vitals:   12/07/22 1354  BP: (!) 170/115  Pulse: (!) 103  Temp: 98.2 F (36.8 C)  TempSrc: Temporal  SpO2: 99%  Weight: 175 lb (79.4 kg)   Body mass index is 25.11 kg/m.  left arm Incision is well healed, 2+ radial pulse, hand grip is 5/5, sensation in digits is intact, palpable thrill, bruit can be auscultated     Medical Decision Making   John [name] is a 32 y.o. year old male who presents s/p Right IJ [facility] placement and creation of left brachial artery to cephalic vein fistula by Dr. Randie Heinz on 10/25/22  The patient's wounds are well healed.  The patient notes no steal symptoms. I discussed with patient and his wife PD catheter. We have not received any referral from Nephrologist for placement of a PD. He does not have any contraindications for PD catheter placement. Will await referral for evaluation and scheduling for this. The patient's access will be ready for use after 01/25/23 The patient's tunneled dialysis catheter can be removed when Nephrology is comfortable with the performance of the left AV fistula. Also if he is transitioned to PD catheter we can be contacted to remove  his [facility] once catheter is working effectively The patient may follow up on a prn basis   Graceann Congress, PA-C [facility] Office: [phone]  Clinic MD: Randie Heinz

## 2022-12-12 ENCOUNTER — Telehealth: Payer: Self-pay | Admitting: Vascular Surgery

## 2022-12-12 NOTE — Telephone Encounter (Signed)
-----   Message from Maeola Harman, MD sent at 12/09/2022  8:16 AM EDT ----- I need to see this patient to discuss pd catheter placement.   Apolinar Junes

## 2022-12-21 ENCOUNTER — Encounter: Payer: Self-pay | Admitting: Vascular Surgery

## 2022-12-21 ENCOUNTER — Ambulatory Visit (INDEPENDENT_AMBULATORY_CARE_PROVIDER_SITE_OTHER): Payer: Commercial Managed Care - HMO | Admitting: Vascular Surgery

## 2022-12-21 VITALS — BP 175/113 | HR 101 | Temp 98.2°F | Wt 180.0 lb

## 2022-12-21 DIAGNOSIS — N186 End stage renal disease: Secondary | ICD-10-CM

## 2022-12-21 NOTE — Progress Notes (Signed)
Patient ID: John [name], male   DOB: August 29, 1990, 32 y.o.   MRN: [id number]  Reason for Consult: Follow-up   Referred by Hoy Register, MD  Subjective:     HPI:  John [name] is a 32 y.o. male with end-stage renal disease currently on dialysis Mondays, Wednesdays and Fridays via right IJ tunneled dialysis catheter.  I recently placed the catheter and left arm AV fistula.  He denies any left upper extremity symptoms since the surgery.  Patient and his significant other are now interested in pursuing peritoneal dialysis as a way to free up some of this time.  He has recently lost his eyesight but is planning for a procedure at [facility] in late May.  He denies any history of abdominal surgeries or abdominal infections in the past.  Past Medical History:  Diagnosis Date   ADD (attention deficit disorder)    Allergic rhinitis    Asthma    Diabetic ketoacidosis without coma associated with type 1 diabetes mellitus (HCC)    DKA (diabetic ketoacidoses)    Goiter, unspecified 02/04/2011   Type I (juvenile type) diabetes mellitus without mention of complication, uncontrolled 02/04/2011   Family History  Problem Relation Age of Onset   Lupus Cousin    Cancer Neg Hx    Past Surgical History:  Procedure Laterality Date   AV FISTULA PLACEMENT Left 10/25/2022   Procedure: CEPHALIC ARTERIOVENOUS (AV) FISTULA CREATION;  Surgeon: Maeola Harman, MD;  Location: [facility] OR;  Service: Vascular;  Laterality: Left;   INSERTION OF DIALYSIS CATHETER Right 10/25/2022   Procedure: INSERTION OF TUNNELED PALINDROME 14.5 FR X 19 CM DIALYSIS CATHETER;  Surgeon: Maeola Harman, MD;  Location: [facility] OR;  Service: Vascular;  Laterality: Right;   TONSILLECTOMY      Short Social History:  Social History   Tobacco Use   Smoking status: Every Day    Packs/day: 1    Types: Cigarettes   Smokeless tobacco: Former    Quit date: 06/26/2013  Substance Use Topics   Alcohol use: Yes    Alcohol/week:  12.0 standard drinks of alcohol    Types: 12 Cans of beer per week    No Known Allergies  Current Outpatient Medications  Medication Sig Dispense Refill   acetaminophen (TYLENOL) 325 MG tablet Take 2 tablets (650 mg total) by mouth every 6 (six) hours as needed for mild pain (or Fever >/= 101).     amLODipine (NORVASC) 10 MG tablet Take 1 tablet (10 mg total) by mouth daily. 30 tablet 1   Calcium Carbonate Antacid (TUMS PO) Take 1 tablet by mouth 2 (two) times daily as needed (heartburn).     glucose blood (ONE TOUCH ULTRA TEST) test strip Use as instructed 3x daily 100 each 12   insulin aspart (NOVOLOG) 100 UNIT/ML injection Inject 5 Units into the skin in the morning, at noon, and at bedtime. Carb coverage and correction     insulin glargine (LANTUS) 100 UNIT/ML injection Inject 23 Units into the skin at bedtime.     Insulin Pen Needle 31G X 8 MM MISC 1 each by Does not apply route 4 (four) times daily -  with meals and at bedtime. 100 each 5   labetalol (NORMODYNE) 100 MG tablet Take 1 tablet (100 mg total) by mouth 2 (two) times daily. 60 tablet 1   Tetrahydrozoline HCl (VISINE OP) Place 2 drops into both eyes as needed (dry eye).     No current facility-administered medications  for this visit.    Review of Systems  Constitutional: Positive for unexpected weight change.  HENT: HENT negative.  Eyes: Positive for loss of vision.   Cardiovascular: Cardiovascular negative.  GI: Gastrointestinal negative.  Musculoskeletal: Musculoskeletal negative.  Skin: Skin negative.  Neurological: Neurological negative. Hematologic: Hematologic/lymphatic negative.  Psychiatric: Psychiatric negative.        Objective:  Objective   Vitals:   12/21/22 0920  BP: (!) 175/113  Pulse: (!) 101  Temp: 98.2 F (36.8 C)  TempSrc: Temporal  SpO2: 93%  Weight: 180 lb (81.6 kg)   Body mass index is 25.83 kg/m.  Physical Exam HENT:     Head: Normocephalic.     Nose: Nose normal.  Eyes:      Pupils: Pupils are equal, round, and reactive to light.  Neck:     Comments: Catheter in place right neck and chest Cardiovascular:     Rate and Rhythm: Normal rate.  Pulmonary:     Effort: Pulmonary effort is normal.  Abdominal:     General: Abdomen is flat.     Palpations: Abdomen is soft.  Musculoskeletal:        General: Normal range of motion.     Cervical back: Normal range of motion and neck supple.     Right lower leg: No edema.     Left lower leg: No edema.     Comments: Strong thrill left arm AV fistula  Skin:    Capillary Refill: Capillary refill takes less than 2 seconds.  Neurological:     Mental Status: He is alert.  Psychiatric:        Mood and Affect: Mood normal.        Thought Content: Thought content normal.        Judgment: Judgment normal.     Data: No studies     Assessment/Plan:    32 year old male with history of end-stage renal disease currently dialyzing via catheter with a left arm AV fistula that is maturing nicely.  Patient now here to discuss peritoneal dialysis without any previous abdominal surgeries.  I discussed the risk and benefits particularly of primary nonfunction and need for additional procedures and the risk of infection in the future and they demonstrate good understanding.  Plan will be for laparoscopic continuous ambulatory PD catheter placement on a nondialysis day in the near future.     Maeola Harman MD [facility]

## 2022-12-21 NOTE — H&P (View-Only) (Signed)
 Patient ID: John [name], male   DOB: 10/11/1990, 32 y.o.   MRN: [id number]  Reason for Consult: Follow-up   Referred by Newlin, Enobong, MD  Subjective:     HPI:  John [name] is a 32 y.o. male with end-stage renal disease currently on dialysis Mondays, Wednesdays and Fridays via right IJ tunneled dialysis catheter.  I recently placed the catheter and left arm AV fistula.  He denies any left upper extremity symptoms since the surgery.  Patient and his significant other are now interested in pursuing peritoneal dialysis as a way to free up some of this time.  He has recently lost his eyesight but is planning for a procedure at [facility] in late May.  He denies any history of abdominal surgeries or abdominal infections in the past.  Past Medical History:  Diagnosis Date   ADD (attention deficit disorder)    Allergic rhinitis    Asthma    Diabetic ketoacidosis without coma associated with type 1 diabetes mellitus (HCC)    DKA (diabetic ketoacidoses)    Goiter, unspecified 02/04/2011   Type I (juvenile type) diabetes mellitus without mention of complication, uncontrolled 02/04/2011   Family History  Problem Relation Age of Onset   Lupus Cousin    Cancer Neg Hx    Past Surgical History:  Procedure Laterality Date   AV FISTULA PLACEMENT Left 10/25/2022   Procedure: CEPHALIC ARTERIOVENOUS (AV) FISTULA CREATION;  Surgeon: Omara Alcon, Mikail Christopher, MD;  Location: [facility] OR;  Service: Vascular;  Laterality: Left;   INSERTION OF DIALYSIS CATHETER Right 10/25/2022   Procedure: INSERTION OF TUNNELED PALINDROME 14.5 FR X 19 CM DIALYSIS CATHETER;  Surgeon: Yusuf Yu, Kayven Christopher, MD;  Location: [facility] OR;  Service: Vascular;  Laterality: Right;   TONSILLECTOMY      Short Social History:  Social History   Tobacco Use   Smoking status: Every Day    Packs/day: 1    Types: Cigarettes   Smokeless tobacco: Former    Quit date: 06/26/2013  Substance Use Topics   Alcohol use: Yes    Alcohol/week:  12.0 standard drinks of alcohol    Types: 12 Cans of beer per week    No Known Allergies  Current Outpatient Medications  Medication Sig Dispense Refill   acetaminophen (TYLENOL) 325 MG tablet Take 2 tablets (650 mg total) by mouth every 6 (six) hours as needed for mild pain (or Fever >/= 101).     amLODipine (NORVASC) 10 MG tablet Take 1 tablet (10 mg total) by mouth daily. 30 tablet 1   Calcium Carbonate Antacid (TUMS PO) Take 1 tablet by mouth 2 (two) times daily as needed (heartburn).     glucose blood (ONE TOUCH ULTRA TEST) test strip Use as instructed 3x daily 100 each 12   insulin aspart (NOVOLOG) 100 UNIT/ML injection Inject 5 Units into the skin in the morning, at noon, and at bedtime. Carb coverage and correction     insulin glargine (LANTUS) 100 UNIT/ML injection Inject 23 Units into the skin at bedtime.     Insulin Pen Needle 31G X 8 MM MISC 1 each by Does not apply route 4 (four) times daily -  with meals and at bedtime. 100 each 5   labetalol (NORMODYNE) 100 MG tablet Take 1 tablet (100 mg total) by mouth 2 (two) times daily. 60 tablet 1   Tetrahydrozoline HCl (VISINE OP) Place 2 drops into both eyes as needed (dry eye).     No current facility-administered medications   for this visit.    Review of Systems  Constitutional: Positive for unexpected weight change.  HENT: HENT negative.  Eyes: Positive for loss of vision.   Cardiovascular: Cardiovascular negative.  GI: Gastrointestinal negative.  Musculoskeletal: Musculoskeletal negative.  Skin: Skin negative.  Neurological: Neurological negative. Hematologic: Hematologic/lymphatic negative.  Psychiatric: Psychiatric negative.        Objective:  Objective   Vitals:   12/21/22 0920  BP: (!) 175/113  Pulse: (!) 101  Temp: 98.2 F (36.8 C)  TempSrc: Temporal  SpO2: 93%  Weight: 180 lb (81.6 kg)   Body mass index is 25.83 kg/m.  Physical Exam HENT:     Head: Normocephalic.     Nose: Nose normal.  Eyes:      Pupils: Pupils are equal, round, and reactive to light.  Neck:     Comments: Catheter in place right neck and chest Cardiovascular:     Rate and Rhythm: Normal rate.  Pulmonary:     Effort: Pulmonary effort is normal.  Abdominal:     General: Abdomen is flat.     Palpations: Abdomen is soft.  Musculoskeletal:        General: Normal range of motion.     Cervical back: Normal range of motion and neck supple.     Right lower leg: No edema.     Left lower leg: No edema.     Comments: Strong thrill left arm AV fistula  Skin:    Capillary Refill: Capillary refill takes less than 2 seconds.  Neurological:     Mental Status: He is alert.  Psychiatric:        Mood and Affect: Mood normal.        Thought Content: Thought content normal.        Judgment: Judgment normal.     Data: No studies     Assessment/Plan:    32-year-old male with history of end-stage renal disease currently dialyzing via catheter with a left arm AV fistula that is maturing nicely.  Patient now here to discuss peritoneal dialysis without any previous abdominal surgeries.  I discussed the risk and benefits particularly of primary nonfunction and need for additional procedures and the risk of infection in the future and they demonstrate good understanding.  Plan will be for laparoscopic continuous ambulatory PD catheter placement on a nondialysis day in the near future.     Lamond Christopher Seletha Zimmermann MD [facility] [place]   

## 2022-12-26 ENCOUNTER — Other Ambulatory Visit: Payer: Self-pay

## 2022-12-26 ENCOUNTER — Telehealth: Payer: Self-pay

## 2022-12-26 DIAGNOSIS — N186 End stage renal disease: Secondary | ICD-10-CM

## 2022-12-26 NOTE — Telephone Encounter (Signed)
Spoke with patient/ significant other [name]. PD catheter insertion surgery scheduled for 5/28. Instructions provided and they verbalized understanding.

## 2022-12-26 NOTE — Telephone Encounter (Signed)
Attempted to reach patient to schedule surgery. Left message for patient to return call.  

## 2023-01-12 ENCOUNTER — Encounter (HOSPITAL_COMMUNITY): Payer: Self-pay | Admitting: Vascular Surgery

## 2023-01-12 ENCOUNTER — Other Ambulatory Visit: Payer: Self-pay

## 2023-01-12 NOTE — Progress Notes (Signed)
John. John [name] denies chest pain or shortness of breath.  Patient denies having any s/s of Covid in his household, also denies any known exposure to Covid. John [name] denies  any s/s of upper or lower respiratory infection in the past 8 weeks.   John [name] has recently changed, he will be seeing Gracy Bruins, AGNP at [name] in [place].  Patient has seen an endocrinologist in the past, but does not see one now.  John [name] has type I diabetes, patient reports that CBGs have been 120-200.  I encouraged patient to keep CBG 180 or lower after surgery. I instructed John [name] to take 18 units of Lantus the hs before surgery;  if CBG is greater than 220 take 1/2 of Humolog sliding scale. I instructed patient to check CBG after awaking and every 2 hours until arrival  to the hospital.  I Instructed patient if CBG is less than 70 to drink 1/2 cup of a clear juice. Recheck CBG in 15 minutes if CBG is not over 70 call, pre- op desk at [phone] for further instructions.

## 2023-01-17 ENCOUNTER — Other Ambulatory Visit: Payer: Self-pay

## 2023-01-17 ENCOUNTER — Ambulatory Visit (HOSPITAL_COMMUNITY): Payer: Commercial Managed Care - HMO | Admitting: Physician Assistant

## 2023-01-17 ENCOUNTER — Ambulatory Visit (HOSPITAL_COMMUNITY)
Admission: RE | Admit: 2023-01-17 | Discharge: 2023-01-17 | Disposition: A | Discharge status: Home / Self Care | Payer: Commercial Managed Care - HMO | Source: Ambulatory Visit | Attending: Vascular Surgery | Admitting: Vascular Surgery

## 2023-01-17 ENCOUNTER — Ambulatory Visit (HOSPITAL_BASED_OUTPATIENT_CLINIC_OR_DEPARTMENT_OTHER): Payer: Commercial Managed Care - HMO | Admitting: Physician Assistant

## 2023-01-17 ENCOUNTER — Encounter (HOSPITAL_COMMUNITY): Payer: Self-pay | Admitting: Vascular Surgery

## 2023-01-17 ENCOUNTER — Encounter (HOSPITAL_COMMUNITY)
Admission: RE | Disposition: A | Discharge status: Home / Self Care | Payer: Self-pay | Source: Ambulatory Visit | Attending: Vascular Surgery

## 2023-01-17 DIAGNOSIS — N186 End stage renal disease: Secondary | ICD-10-CM

## 2023-01-17 DIAGNOSIS — Z87891 Personal history of nicotine dependence: Secondary | ICD-10-CM | POA: Insufficient documentation

## 2023-01-17 DIAGNOSIS — Z794 Long term (current) use of insulin: Secondary | ICD-10-CM | POA: Insufficient documentation

## 2023-01-17 DIAGNOSIS — Z992 Dependence on renal dialysis: Secondary | ICD-10-CM | POA: Insufficient documentation

## 2023-01-17 DIAGNOSIS — N185 Chronic kidney disease, stage 5: Secondary | ICD-10-CM

## 2023-01-17 DIAGNOSIS — E1022 Type 1 diabetes mellitus with diabetic chronic kidney disease: Secondary | ICD-10-CM

## 2023-01-17 DIAGNOSIS — I120 Hypertensive chronic kidney disease with stage 5 chronic kidney disease or end stage renal disease: Secondary | ICD-10-CM | POA: Diagnosis not present

## 2023-01-17 HISTORY — DX: Pneumonia, unspecified organism: J18.9

## 2023-01-17 HISTORY — DX: Other specified postprocedural states: Z98.890

## 2023-01-17 HISTORY — DX: Essential (primary) hypertension: I10

## 2023-01-17 HISTORY — DX: Chronic kidney disease, unspecified: N18.9

## 2023-01-17 HISTORY — DX: Family history of other specified conditions: Z84.89

## 2023-01-17 HISTORY — DX: Headache, unspecified: R51.9

## 2023-01-17 HISTORY — PX: CAPD INSERTION: SHX5233

## 2023-01-17 HISTORY — DX: Other complications of anesthesia, initial encounter: T88.59XA

## 2023-01-17 LAB — POCT I-STAT, CHEM 8
BUN: 30 mg/dL — ABNORMAL HIGH (ref 6–20)
Calcium, Ion: 1.1 mmol/L — ABNORMAL LOW (ref 1.15–1.40)
Chloride: 94 mmol/L — ABNORMAL LOW (ref 98–111)
Creatinine, Ser: 5 mg/dL — ABNORMAL HIGH (ref 0.61–1.24)
Glucose, Bld: 333 mg/dL — ABNORMAL HIGH (ref 70–99)
HCT: 27 % — ABNORMAL LOW (ref 39.0–52.0)
Hemoglobin: 9.2 g/dL — ABNORMAL LOW (ref 13.0–17.0)
Potassium: 3.9 mmol/L (ref 3.5–5.1)
Sodium: 139 mmol/L (ref 135–145)
TCO2: 32 mmol/L (ref 22–32)

## 2023-01-17 LAB — GLUCOSE, CAPILLARY
Glucose-Capillary: 318 mg/dL — ABNORMAL HIGH (ref 70–99)
Glucose-Capillary: 79 mg/dL (ref 70–99)
Glucose-Capillary: 79 mg/dL (ref 70–99)

## 2023-01-17 SURGERY — LAPAROSCOPIC INSERTION CONTINUOUS AMBULATORY PERITONEAL DIALYSIS  (CAPD) CATHETER
Anesthesia: General | Site: Abdomen

## 2023-01-17 MED ORDER — PROPOFOL 10 MG/ML IV BOLUS
INTRAVENOUS | Status: AC
  Filled 2023-01-17: qty 20

## 2023-01-17 MED ORDER — LIDOCAINE 2% (20 MG/ML) 5 ML SYRINGE
INTRAMUSCULAR | Status: AC
  Filled 2023-01-17: qty 5

## 2023-01-17 MED ORDER — MIDAZOLAM HCL 5 MG/5ML IJ SOLN
INTRAMUSCULAR | Status: DC | PRN
  Administered 2023-01-17: 2 mg via INTRAVENOUS

## 2023-01-17 MED ORDER — FENTANYL CITRATE (PF) 100 MCG/2ML IJ SOLN: 25.0000 ug | INTRAMUSCULAR | Status: DC | PRN

## 2023-01-17 MED ORDER — DEXTROSE 50 % IV SOLN
25.0000 mL | Freq: Once | INTRAVENOUS | Status: AC
  Administered 2023-01-17: 25 mL via INTRAVENOUS

## 2023-01-17 MED ORDER — MIDAZOLAM HCL 2 MG/2ML IJ SOLN
INTRAMUSCULAR | Status: AC
  Filled 2023-01-17: qty 2

## 2023-01-17 MED ORDER — FENTANYL CITRATE (PF) 250 MCG/5ML IJ SOLN
INTRAMUSCULAR | Status: AC
  Filled 2023-01-17: qty 5

## 2023-01-17 MED ORDER — SUCCINYLCHOLINE CHLORIDE 200 MG/10ML IV SOSY
PREFILLED_SYRINGE | INTRAVENOUS | Status: DC | PRN
  Administered 2023-01-17: 100 mg via INTRAVENOUS

## 2023-01-17 MED ORDER — DROPERIDOL 2.5 MG/ML IJ SOLN
INTRAMUSCULAR | Status: DC | PRN
  Administered 2023-01-17: .625 mg via INTRAVENOUS

## 2023-01-17 MED ORDER — CHLORHEXIDINE GLUCONATE 0.12 % MT SOLN
15.0000 mL | Freq: Once | OROMUCOSAL | Status: AC
  Administered 2023-01-17: 15 mL via OROMUCOSAL
  Filled 2023-01-17: qty 15

## 2023-01-17 MED ORDER — OXYCODONE HCL 5 MG PO TABS: 5.0000 mg | ORAL_TABLET | Freq: Once | ORAL | Status: DC | PRN

## 2023-01-17 MED ORDER — 0.9 % SODIUM CHLORIDE (POUR BTL) OPTIME
TOPICAL | Status: DC | PRN
  Administered 2023-01-17: 1000 mL

## 2023-01-17 MED ORDER — DROPERIDOL 2.5 MG/ML IJ SOLN
INTRAMUSCULAR | Status: AC
  Filled 2023-01-17: qty 2

## 2023-01-17 MED ORDER — CHLORHEXIDINE GLUCONATE 4 % EX SOLN: 60.0000 mL | Freq: Once | CUTANEOUS | Status: DC

## 2023-01-17 MED ORDER — ORAL CARE MOUTH RINSE: 15.0000 mL | Freq: Once | OROMUCOSAL | Status: AC

## 2023-01-17 MED ORDER — FENTANYL CITRATE (PF) 250 MCG/5ML IJ SOLN
INTRAMUSCULAR | Status: DC | PRN
  Administered 2023-01-17 (×2): 50 ug via INTRAVENOUS

## 2023-01-17 MED ORDER — LIDOCAINE 2% (20 MG/ML) 5 ML SYRINGE
INTRAMUSCULAR | Status: DC | PRN
  Administered 2023-01-17: 100 mg via INTRAVENOUS

## 2023-01-17 MED ORDER — PROPOFOL 10 MG/ML IV BOLUS
INTRAVENOUS | Status: DC | PRN
  Administered 2023-01-17: 120 mg via INTRAVENOUS

## 2023-01-17 MED ORDER — ROCURONIUM BROMIDE 10 MG/ML (PF) SYRINGE
PREFILLED_SYRINGE | INTRAVENOUS | Status: AC
  Filled 2023-01-17: qty 10

## 2023-01-17 MED ORDER — TRAMADOL HCL 50 MG PO TABS: 50.0000 mg | ORAL_TABLET | Freq: Four times a day (QID) | ORAL | 0 refills | Status: DC | PRN

## 2023-01-17 MED ORDER — SUCCINYLCHOLINE CHLORIDE 200 MG/10ML IV SOSY
PREFILLED_SYRINGE | INTRAVENOUS | Status: AC
  Filled 2023-01-17: qty 10

## 2023-01-17 MED ORDER — ONDANSETRON HCL 4 MG/2ML IJ SOLN
INTRAMUSCULAR | Status: DC | PRN
  Administered 2023-01-17: 4 mg via INTRAVENOUS

## 2023-01-17 MED ORDER — CEFAZOLIN SODIUM-DEXTROSE 2-4 GM/100ML-% IV SOLN
2.0000 g | INTRAVENOUS | Status: AC
  Administered 2023-01-17: 2 g via INTRAVENOUS
  Filled 2023-01-17: qty 100

## 2023-01-17 MED ORDER — ONDANSETRON HCL 4 MG/2ML IJ SOLN: 4.0000 mg | Freq: Once | INTRAMUSCULAR | Status: DC | PRN

## 2023-01-17 MED ORDER — SODIUM CHLORIDE 0.9 % IV SOLN: INTRAVENOUS | Status: DC

## 2023-01-17 MED ORDER — BUPIVACAINE-EPINEPHRINE 0.25% -1:200000 IJ SOLN: INTRAMUSCULAR | Status: DC | PRN

## 2023-01-17 MED ORDER — LIDOCAINE HCL (PF) 1 % IJ SOLN
INTRAMUSCULAR | Status: AC
  Filled 2023-01-17: qty 30

## 2023-01-17 MED ORDER — SODIUM CHLORIDE 0.9 % IR SOLN
Status: DC | PRN
  Administered 2023-01-17: 1000 mL

## 2023-01-17 MED ORDER — ONDANSETRON HCL 4 MG/2ML IJ SOLN
INTRAMUSCULAR | Status: AC
  Filled 2023-01-17: qty 2

## 2023-01-17 MED ORDER — OXYCODONE HCL 5 MG/5ML PO SOLN: 5.0000 mg | Freq: Once | ORAL | Status: DC | PRN

## 2023-01-17 MED ORDER — SUGAMMADEX SODIUM 200 MG/2ML IV SOLN
INTRAVENOUS | Status: DC | PRN
  Administered 2023-01-17: 161.4 mg via INTRAVENOUS

## 2023-01-17 MED ORDER — DEXTROSE 50 % IV SOLN
INTRAVENOUS | Status: AC
  Filled 2023-01-17: qty 50

## 2023-01-17 MED ORDER — INSULIN ASPART 100 UNIT/ML IJ SOLN
0.0000 [IU] | INTRAMUSCULAR | Status: DC | PRN
  Administered 2023-01-17: 5 [IU] via SUBCUTANEOUS
  Filled 2023-01-17: qty 1

## 2023-01-17 MED ORDER — ROCURONIUM BROMIDE 10 MG/ML (PF) SYRINGE
PREFILLED_SYRINGE | INTRAVENOUS | Status: DC | PRN
  Administered 2023-01-17: 30 mg via INTRAVENOUS

## 2023-01-17 SURGICAL SUPPLY — 59 items
ADAPTER TITANIUM MEDIONICS (MISCELLANEOUS) ×1 IMPLANT
ADH SKN CLS APL DERMABOND .7 (GAUZE/BANDAGES/DRESSINGS) ×1
ADPR DLYS CATH STRL LF DISP (MISCELLANEOUS) ×1
APL PRP STRL LF DISP 70% ISPRP (MISCELLANEOUS) ×1
APPLIER CLIP 5 13 M/L LIGAMAX5 (MISCELLANEOUS)
APR CLP MED LRG 5 ANG JAW (MISCELLANEOUS)
BAG DECANTER FOR FLEXI CONT (MISCELLANEOUS) ×1 IMPLANT
BIOPATCH RED 1 DISK 7.0 (GAUZE/BANDAGES/DRESSINGS) ×1 IMPLANT
BLADE CLIPPER SURG (BLADE) IMPLANT
BLADE SURG 11 STRL SS (BLADE) ×1 IMPLANT
CATH EXTENDED DIALYSIS (CATHETERS) IMPLANT
CHLORAPREP W/TINT 26 (MISCELLANEOUS) ×1 IMPLANT
CLIP APPLIE 5 13 M/L LIGAMAX5 (MISCELLANEOUS) IMPLANT
COVER SURGICAL LIGHT HANDLE (MISCELLANEOUS) ×1 IMPLANT
DERMABOND ADVANCED .7 DNX12 (GAUZE/BANDAGES/DRESSINGS) ×1 IMPLANT
DEVICE TROCAR PUNCTURE CLOSURE (ENDOMECHANICALS) ×1 IMPLANT
DRSG TEGADERM 4X4.75 (GAUZE/BANDAGES/DRESSINGS) ×3 IMPLANT
ELECT REM PT RETURN 9FT ADLT (ELECTROSURGICAL) ×1
ELECTRODE REM PT RTRN 9FT ADLT (ELECTROSURGICAL) ×1 IMPLANT
GAUZE SPONGE 4X4 12PLY STRL (GAUZE/BANDAGES/DRESSINGS) ×1 IMPLANT
GLOVE INDICATOR 6.5 STRL GRN (GLOVE) ×1 IMPLANT
GLOVE SURG UNDER LTX SZ7.5 (GLOVE) ×1 IMPLANT
GOWN STRL REUS W/ TWL LRG LVL3 (GOWN DISPOSABLE) ×2 IMPLANT
GOWN STRL REUS W/ TWL XL LVL3 (GOWN DISPOSABLE) ×1 IMPLANT
GOWN STRL REUS W/TWL LRG LVL3 (GOWN DISPOSABLE) ×2
GOWN STRL REUS W/TWL XL LVL3 (GOWN DISPOSABLE) ×1
GRASPER SUT TROCAR 14GX15 (MISCELLANEOUS) ×1 IMPLANT
IRRIG SUCT STRYKERFLOW 2 WTIP (MISCELLANEOUS)
IRRIGATION SUCT STRKRFLW 2 WTP (MISCELLANEOUS) IMPLANT
IV NS 1000ML (IV SOLUTION) ×1
IV NS 1000ML BAXH (IV SOLUTION) ×1 IMPLANT
KIT BASIN OR (CUSTOM PROCEDURE TRAY) ×1 IMPLANT
KIT TURNOVER KIT B (KITS) ×1 IMPLANT
NDL INSUFFLATION 14GA 120MM (NEEDLE) ×1 IMPLANT
NEEDLE INSUFFLATION 14GA 120MM (NEEDLE) ×1 IMPLANT
NS IRRIG 1000ML POUR BTL (IV SOLUTION) ×1 IMPLANT
PAD ARMBOARD 7.5X6 YLW CONV (MISCELLANEOUS) ×2 IMPLANT
POWDER SURGICEL 3.0 GRAM (HEMOSTASIS) IMPLANT
SCISSORS LAP 5X35 DISP (ENDOMECHANICALS) IMPLANT
SET CYSTO W/LG BORE CLAMP LF (SET/KITS/TRAYS/PACK) ×1 IMPLANT
SET EXT 12IN DIALYSIS STAY-SAF (MISCELLANEOUS) ×1 IMPLANT
SET TUBE SMOKE EVAC HIGH FLOW (TUBING) ×1 IMPLANT
SLEEVE Z-THREAD 5X100MM (TROCAR) ×2 IMPLANT
SPIKE FLUID TRANSFER (MISCELLANEOUS) ×1 IMPLANT
STYLET FALLER (MISCELLANEOUS) ×1 IMPLANT
STYLET FALLER MEDIONICS (MISCELLANEOUS) ×1 IMPLANT
SUT MNCRL AB 4-0 PS2 18 (SUTURE) ×1 IMPLANT
SUT PROLENE 0 SH 30 (SUTURE) ×2 IMPLANT
SUT SILK 0 TIES 10X30 (SUTURE) ×1 IMPLANT
SUT VICRYL 3 0 (SUTURE) IMPLANT
TAPE CLOTH SURG 4X10 WHT LF (GAUZE/BANDAGES/DRESSINGS) IMPLANT
TOWEL GREEN STERILE (TOWEL DISPOSABLE) ×1 IMPLANT
TOWEL GREEN STERILE FF (TOWEL DISPOSABLE) ×1 IMPLANT
TRAY LAPAROSCOPIC MC (CUSTOM PROCEDURE TRAY) ×1 IMPLANT
TROCAR 11X100 Z THREAD (TROCAR) IMPLANT
TROCAR 5MMX150MM (TROCAR) ×1 IMPLANT
TROCAR XCEL NON-BLD 5MMX100MML (ENDOMECHANICALS) ×1 IMPLANT
TROCAR Z-THREAD OPTICAL 5X100M (TROCAR) ×1 IMPLANT
WATER STERILE IRR 1000ML POUR (IV SOLUTION) ×1 IMPLANT

## 2023-01-17 NOTE — Anesthesia Postprocedure Evaluation (Signed)
Anesthesia Post Note  Patient: John [name]  Procedure(s) Performed: LAPAROSCOPIC INSERTION CONTINUOUS AMBULATORY PERITONEAL DIALYSIS  (CAPD) CATHETER (Abdomen) LAPAROSCOPIC OMENTOPEXY (Abdomen)     Patient location during evaluation: PACU Anesthesia Type: General Level of consciousness: awake and alert Pain management: pain level controlled Vital Signs Assessment: post-procedure vital signs reviewed and stable Respiratory status: spontaneous breathing, nonlabored ventilation, respiratory function stable and patient connected to nasal cannula oxygen Cardiovascular status: blood pressure returned to baseline and stable Postop Assessment: no apparent nausea or vomiting Anesthetic complications: no  No notable events documented.  Last Vitals:  Vitals:   01/17/23 0830 01/17/23 0845  BP: (!) 138/100 (!) 140/97  Pulse: 98 (!) 101  Resp: 16 14  Temp:    SpO2: 94% 90%    Last Pain:  Vitals:   01/17/23 0825  TempSrc:   PainSc: Asleep                 Vanisha Whiten S

## 2023-01-17 NOTE — Op Note (Signed)
    Patient name: John [name] MRN: [id number] DOB: 1990-09-05 Sex: male  01/17/2023 Pre-operative Diagnosis: End-stage renal disease Post-operative diagnosis:  Same Surgeon:  Apolinar Junes C. Randie Heinz, MD Assistant: Loel Dubonnet, PA Procedure Performed:Laparoscopic continuous ambulatory peritoneal dialysis catheter placement  Indications: 32 year old male with end-stage renal disease currently dialyzing via right IJ tunneled dialysis catheter and also has a functional left cephalic vein AV fistula which has not been used as of yet.  He is now planning for peritoneal dialysis at home with the help of his significant other and we have scheduled laparoscopic CAPD and discussed the risk benefits alternatives.  Experience assistant was necessary to assist with placing the catheter under direct laparoscopic vision as well as maneuvering the camera.  Findings: There were no intra-abdominal adhesions and very minimal omentum.  Catheter was placed just anterior to the rectum and 600 cc of saline were easily instilled and returned quickly.  Catheter is ready for use immediately.   Procedure:  The patient was identified in the holding area and taken to the operating room placed the supine operative table and general anesthesia was induced.  He was sterilely prepped and draped in the abdomen in usual fashion, antibiotics were administered and a timeout was called.  A small incision was made with 11 blade just below the costal margin on the left side.  A very needle was used to gain entrance to the abdomen and the abdomen was insufflated.  In the right upper quadrant Optiview trocar was used to gain direct visualized entrance to the abdominal cavity and this was inspected there were no notable intra-abdominal injuries.  The Veress needle was removed.  A second trocar was placed in the right mid abdomen with direct laparoscopic visualization.  Patient was placed in Trendelenburg position the abdominal contents were  mobilized cephalad.  The abdomen was desufflated and the catheter was marked on the skin.  A third trocar was then placed via counterincision just lateral to the left of the umbilicus.  This was tunneled extraperitoneal and then entered the abdomen.  The catheter was then placed with the cuff just extraperitoneal and the catheter and just anterior to the rectum.  Christmas tree adapter was then connected and the catheter was then tunneled first to a counterincision where degrees needle was placed and then out the left lateral in the upper quadrant.  The abdomen was inspected laparoscopically catheter was noted to be in good position there was no significant omentum for tacking.  The abdomen was desufflated patient was placed in reverse Trendelenburg position and 600 cc of saline was easily instilled and quickly returned.  The trocars were removed the incisions were closed with 4-0 Monocryl and Dermabond was placed at the skin level.  Patient was awakened from anesthesia having tolerated the procedure without any complication.  All counts were correct at completion.  EBL: 10 cc    Terran C. Randie Heinz, MD [facility] [place] Office: [phone] Pager: [phone]

## 2023-01-17 NOTE — Progress Notes (Signed)
Dr. Okey Dupre made aware of patient elevated BP and pulse readings this am. BP 173/111 at 0609 and 157/109 at 0627. Patient took Labetalol and Norvasc at 0445 this morning. Ok per Dr. Okey Dupre.  Dr. Okey Dupre also made aware patient's CBG was 318 at [phone] and that patient took 10 units of Novolog at 0445 this morning. Per the Peri op  glycemic protocol patient should received 5 units of Novolog. Ok to proceed with Novolog 5 units per Dr. Okey Dupre.

## 2023-01-17 NOTE — Interval H&P Note (Signed)
History and Physical Interval Note:  01/17/2023 7:14 AM  John [name]  has presented today for surgery, with the diagnosis of ESRD.  The various methods of treatment have been discussed with the patient and family. After consideration of risks, benefits and other options for treatment, the patient has consented to  Procedure(s): LAPAROSCOPIC INSERTION CONTINUOUS AMBULATORY PERITONEAL DIALYSIS  (CAPD) CATHETER (N/A) POSSIBLE LAPAROSCOPIC OMENTOPEXY (N/A) as a surgical intervention.  The patient's history has been reviewed, patient examined, no change in status, stable for surgery.  I have reviewed the patient's chart and labs.  Questions were answered to the patient's satisfaction.     Lemar Livings

## 2023-01-17 NOTE — Anesthesia Procedure Notes (Signed)
Procedure Name: Intubation Date/Time: 01/17/2023 7:30 AM  Performed by: Cy Blamer, CRNAPre-anesthesia Checklist: Patient identified, Emergency Drugs available, Suction available and Patient being monitored Patient Re-evaluated:Patient Re-evaluated prior to induction Oxygen Delivery Method: Circle system utilized Preoxygenation: Pre-oxygenation with 100% oxygen Induction Type: IV induction Ventilation: Mask ventilation without difficulty Laryngoscope Size: Miller and 2 Grade View: Grade II Tube type: Oral Tube size: 7.5 mm Number of attempts: 1 Airway Equipment and Method: Stylet and Bite block Placement Confirmation: ETT inserted through vocal cords under direct vision, positive ETCO2 and breath sounds checked- equal and bilateral Secured at: 23 cm Tube secured with: Tape Dental Injury: Teeth and Oropharynx as per pre-operative assessment

## 2023-01-17 NOTE — Anesthesia Preprocedure Evaluation (Signed)
Anesthesia Evaluation  Patient identified by MRN, date of birth, ID band Patient awake    Reviewed: Allergy & Precautions, H&P , NPO status , Patient's Chart, lab work & pertinent test results  History of Anesthesia Complications (+) PONV and history of anesthetic complications  Airway Mallampati: II  TM Distance: >3 FB Neck ROM: Full    Dental no notable dental hx.    Pulmonary neg pulmonary ROS, former smoker   Pulmonary exam normal breath sounds clear to auscultation       Cardiovascular hypertension, Normal cardiovascular exam Rhythm:Regular Rate:Normal     Neuro/Psych negative neurological ROS  negative psych ROS   GI/Hepatic negative GI ROS, Neg liver ROS,,,  Endo/Other  diabetes, Type 1    Renal/GU DialysisRenal disease  negative genitourinary   Musculoskeletal negative musculoskeletal ROS (+)    Abdominal   Peds negative pediatric ROS (+)  Hematology negative hematology ROS (+)   Anesthesia Other Findings   Reproductive/Obstetrics negative OB ROS                             Anesthesia Physical Anesthesia Plan  ASA: 3  Anesthesia Plan: General   Post-op Pain Management: Minimal or no pain anticipated   Induction: Intravenous  PONV Risk Score and Plan: 4 or greater and Ondansetron, Dexamethasone, Treatment may vary due to age or medical condition and Midazolam  Airway Management Planned: Oral ETT  Additional Equipment:   Intra-op Plan:   Post-operative Plan: Extubation in OR  Informed Consent: I have reviewed the patients History and Physical, chart, labs and discussed the procedure including the risks, benefits and alternatives for the proposed anesthesia with the patient or authorized representative who has indicated his/her understanding and acceptance.     Dental advisory given  Plan Discussed with: CRNA and Surgeon  Anesthesia Plan Comments:         Anesthesia Quick Evaluation

## 2023-01-17 NOTE — Transfer of Care (Signed)
Immediate Anesthesia Transfer of Care Note  Patient: John [name]  Procedure(s) Performed: LAPAROSCOPIC INSERTION CONTINUOUS AMBULATORY PERITONEAL DIALYSIS  (CAPD) CATHETER (Abdomen) LAPAROSCOPIC OMENTOPEXY (Abdomen)  Patient Location: PACU  Anesthesia Type:General  Level of Consciousness: awake, alert , and oriented  Airway & Oxygen Therapy: Patient Spontanous Breathing and Patient connected to nasal cannula oxygen  Post-op Assessment: Report given to RN, Post -op Vital signs reviewed and stable, Patient moving all extremities X 4, and Patient able to stick tongue midline  Post vital signs: Reviewed  Last Vitals:  Vitals Value Taken Time  BP 138/101 01/17/23 0825  Temp 97.8   Pulse 98 01/17/23 0825  Resp 17   SpO2 94 % 01/17/23 0825  Vitals shown include unvalidated device data.  Last Pain:  Vitals:   01/17/23 0613  TempSrc: Oral  PainSc: 0-No pain      Patients Stated Pain Goal: 0 (01/17/23 1610)  Complications: No notable events documented.

## 2023-01-18 ENCOUNTER — Encounter (HOSPITAL_COMMUNITY): Payer: Self-pay | Admitting: Vascular Surgery

## 2023-04-21 ENCOUNTER — Telehealth (HOSPITAL_COMMUNITY): Payer: Self-pay | Admitting: *Deleted

## 2023-04-21 NOTE — Telephone Encounter (Signed)
Received fax from Dr Crista Elliot requesting PD catheter removal.  Will give to [facility].

## 2023-04-28 ENCOUNTER — Other Ambulatory Visit: Payer: Self-pay

## 2023-04-28 DIAGNOSIS — N186 End stage renal disease: Secondary | ICD-10-CM

## 2023-05-10 ENCOUNTER — Encounter (HOSPITAL_COMMUNITY): Payer: Self-pay | Admitting: Vascular Surgery

## 2023-05-10 ENCOUNTER — Other Ambulatory Visit: Payer: Self-pay

## 2023-05-10 NOTE — Progress Notes (Addendum)
PCP - Kathlen Brunswick, NP  Cardiologist - none  Chest x-ray - 10/25/22 (1V) EKG - 10/20/22, 05/03/23 CE - Requested Stress Test - n/a ECHO - 10/24/22 Cardiac Cath - n/a  ICD Pacemaker/Loop - n/a  Sleep Study -  n/a CPAP - none  Diabetes Type 1  THE NIGHT BEFORE SURGERY, patient stated that he was instructed by MD to take 10 units at bedtime of Lantus/Basaglar Insulin.      THE NIGHT BEFORE SURGERY, do not take Novolog Insulin bedtime dose and do not take Novolog on day of surgery unless the CBG is greater than 220mg /dL.  If your CBG is greater than 220 mg/dL, you may take  of your sliding scale (correction) dose of Novolog Insulin  If your blood sugar is less than 70 mg/dL, you will need to treat for low blood sugar: Treat a low blood sugar (less than 70 mg/dL) with  cup of clear juice (cranberry or apple), 4 glucose tablets, OR glucose gel. Recheck blood sugar in 15 minutes after treatment (to make sure it is greater than 70 mg/dL). If your blood sugar is not greater than 70 mg/dL on recheck, call [phone] for further instructions.  NPO  Anesthesia review: Yes  STOP now taking any Aspirin (unless otherwise instructed by your surgeon), Aleve, Naproxen, Ibuprofen, Motrin, Advil, Goody's, BC's, all herbal medications, fish oil, and all vitamins.   Coronavirus Screening Do you have any of the following symptoms:  Cough yes/no: No Fever (>100.66F)  yes/no: No Runny nose yes/no: No Sore throat yes/no: No Difficulty breathing/shortness of breath  yes/no: No  Have you traveled in the last 14 days and where? yes/no: No  Patient verbalized understanding of instructions that were given via phone.

## 2023-05-12 ENCOUNTER — Other Ambulatory Visit: Payer: Self-pay

## 2023-05-12 ENCOUNTER — Ambulatory Visit (HOSPITAL_BASED_OUTPATIENT_CLINIC_OR_DEPARTMENT_OTHER): Payer: Commercial Managed Care - HMO | Admitting: Physician Assistant

## 2023-05-12 ENCOUNTER — Ambulatory Visit (HOSPITAL_COMMUNITY): Payer: Commercial Managed Care - HMO | Admitting: Physician Assistant

## 2023-05-12 ENCOUNTER — Encounter (HOSPITAL_COMMUNITY): Payer: Self-pay | Admitting: Vascular Surgery

## 2023-05-12 ENCOUNTER — Ambulatory Visit (HOSPITAL_COMMUNITY)
Admission: RE | Admit: 2023-05-12 | Discharge: 2023-05-12 | Disposition: A | Discharge status: Home / Self Care | Payer: Commercial Managed Care - HMO | Source: Ambulatory Visit | Attending: Vascular Surgery | Admitting: Vascular Surgery

## 2023-05-12 ENCOUNTER — Encounter (HOSPITAL_COMMUNITY)
Admission: RE | Disposition: A | Discharge status: Home / Self Care | Payer: Self-pay | Source: Ambulatory Visit | Attending: Vascular Surgery

## 2023-05-12 DIAGNOSIS — I120 Hypertensive chronic kidney disease with stage 5 chronic kidney disease or end stage renal disease: Secondary | ICD-10-CM | POA: Insufficient documentation

## 2023-05-12 DIAGNOSIS — E1069 Type 1 diabetes mellitus with other specified complication: Secondary | ICD-10-CM | POA: Diagnosis not present

## 2023-05-12 DIAGNOSIS — N185 Chronic kidney disease, stage 5: Secondary | ICD-10-CM | POA: Diagnosis not present

## 2023-05-12 DIAGNOSIS — Z992 Dependence on renal dialysis: Secondary | ICD-10-CM | POA: Diagnosis not present

## 2023-05-12 DIAGNOSIS — Z794 Long term (current) use of insulin: Secondary | ICD-10-CM | POA: Diagnosis not present

## 2023-05-12 DIAGNOSIS — N186 End stage renal disease: Secondary | ICD-10-CM | POA: Insufficient documentation

## 2023-05-12 DIAGNOSIS — E1010 Type 1 diabetes mellitus with ketoacidosis without coma: Secondary | ICD-10-CM | POA: Diagnosis not present

## 2023-05-12 DIAGNOSIS — Z87891 Personal history of nicotine dependence: Secondary | ICD-10-CM

## 2023-05-12 DIAGNOSIS — E1022 Type 1 diabetes mellitus with diabetic chronic kidney disease: Secondary | ICD-10-CM | POA: Insufficient documentation

## 2023-05-12 HISTORY — DX: Iron deficiency anemia, unspecified: D50.9

## 2023-05-12 HISTORY — PX: CAPD REMOVAL: SHX5234

## 2023-05-12 HISTORY — DX: Anemia, unspecified: D64.9

## 2023-05-12 LAB — POCT I-STAT, CHEM 8
BUN: 42 mg/dL — ABNORMAL HIGH (ref 6–20)
BUN: 42 mg/dL — ABNORMAL HIGH (ref 6–20)
Calcium, Ion: 1.07 mmol/L — ABNORMAL LOW (ref 1.15–1.40)
Calcium, Ion: 1.07 mmol/L — ABNORMAL LOW (ref 1.15–1.40)
Chloride: 99 mmol/L (ref 98–111)
Chloride: 99 mmol/L (ref 98–111)
Creatinine, Ser: 7.1 mg/dL — ABNORMAL HIGH (ref 0.61–1.24)
Creatinine, Ser: 7.3 mg/dL — ABNORMAL HIGH (ref 0.61–1.24)
Glucose, Bld: 288 mg/dL — ABNORMAL HIGH (ref 70–99)
Glucose, Bld: 108 mg/dL — ABNORMAL HIGH (ref 70–99)
HCT: 30 % — ABNORMAL LOW (ref 39.0–52.0)
HCT: 29 % — ABNORMAL LOW (ref 39.0–52.0)
Hemoglobin: 10.2 g/dL — ABNORMAL LOW (ref 13.0–17.0)
Hemoglobin: 9.9 g/dL — ABNORMAL LOW (ref 13.0–17.0)
Potassium: 4.5 mmol/L (ref 3.5–5.1)
Potassium: 4.7 mmol/L (ref 3.5–5.1)
Sodium: 136 mmol/L (ref 135–145)
Sodium: 137 mmol/L (ref 135–145)
TCO2: 25 mmol/L (ref 22–32)
TCO2: 27 mmol/L (ref 22–32)

## 2023-05-12 LAB — GLUCOSE, CAPILLARY
Glucose-Capillary: 291 mg/dL — ABNORMAL HIGH (ref 70–99)
Glucose-Capillary: 203 mg/dL — ABNORMAL HIGH (ref 70–99)
Glucose-Capillary: 111 mg/dL — ABNORMAL HIGH (ref 70–99)
Glucose-Capillary: 105 mg/dL — ABNORMAL HIGH (ref 70–99)
Glucose-Capillary: 144 mg/dL — ABNORMAL HIGH (ref 70–99)
Glucose-Capillary: 197 mg/dL — ABNORMAL HIGH (ref 70–99)

## 2023-05-12 SURGERY — LAPAROSCOPIC REMOVAL CONTINUOUS AMBULATORY PERITONEAL DIALYSIS  (CAPD) CATHETER
Anesthesia: General | Site: Abdomen

## 2023-05-12 MED ORDER — ESMOLOL HCL 100 MG/10ML IV SOLN
INTRAVENOUS | Status: DC | PRN
Start: 2023-05-12 — End: 2023-05-12
  Administered 2023-05-12: 20 mg via INTRAVENOUS

## 2023-05-12 MED ORDER — ROCURONIUM BROMIDE 10 MG/ML (PF) SYRINGE
PREFILLED_SYRINGE | INTRAVENOUS | Status: AC
  Filled 2023-05-12: qty 10

## 2023-05-12 MED ORDER — ROCURONIUM BROMIDE 10 MG/ML (PF) SYRINGE
PREFILLED_SYRINGE | INTRAVENOUS | Status: DC | PRN
  Administered 2023-05-12: 20 mg via INTRAVENOUS

## 2023-05-12 MED ORDER — INSULIN ASPART 100 UNIT/ML IJ SOLN
INTRAMUSCULAR | Status: AC
  Filled 2023-05-12: qty 1

## 2023-05-12 MED ORDER — INSULIN ASPART 100 UNIT/ML IJ SOLN: 0.0000 [IU] | INTRAMUSCULAR | Status: DC | PRN

## 2023-05-12 MED ORDER — SUCCINYLCHOLINE CHLORIDE 200 MG/10ML IV SOSY
PREFILLED_SYRINGE | INTRAVENOUS | Status: DC | PRN
  Administered 2023-05-12: 120 mg via INTRAVENOUS

## 2023-05-12 MED ORDER — ONDANSETRON HCL 4 MG/2ML IJ SOLN
INTRAMUSCULAR | Status: DC | PRN
  Administered 2023-05-12: 4 mg via INTRAVENOUS

## 2023-05-12 MED ORDER — CHLORHEXIDINE GLUCONATE 4 % EX SOLN: 60.0000 mL | Freq: Once | CUTANEOUS | Status: DC

## 2023-05-12 MED ORDER — SODIUM CHLORIDE 0.9 % IR SOLN
Status: DC | PRN
  Administered 2023-05-12: 1000 mL

## 2023-05-12 MED ORDER — CHLORHEXIDINE GLUCONATE 0.12 % MT SOLN
OROMUCOSAL | Status: AC
  Administered 2023-05-12: 15 mL via OROMUCOSAL
  Filled 2023-05-12: qty 15

## 2023-05-12 MED ORDER — SODIUM CHLORIDE 0.9 % IV SOLN: INTRAVENOUS | Status: DC

## 2023-05-12 MED ORDER — MIDAZOLAM HCL (PF) 10 MG/2ML IJ SOLN
INTRAMUSCULAR | Status: AC
  Filled 2023-05-12: qty 2

## 2023-05-12 MED ORDER — OXYCODONE HCL 5 MG PO TABS
ORAL_TABLET | ORAL | Status: AC
  Filled 2023-05-12: qty 1

## 2023-05-12 MED ORDER — DEXAMETHASONE SODIUM PHOSPHATE 10 MG/ML IJ SOLN
INTRAMUSCULAR | Status: AC
  Filled 2023-05-12: qty 1

## 2023-05-12 MED ORDER — LACTATED RINGERS IV SOLN: INTRAVENOUS | Status: DC

## 2023-05-12 MED ORDER — DEXTROSE 50 % IV SOLN
12.5000 g | Freq: Once | INTRAVENOUS | Status: AC
  Administered 2023-05-12: 12.5 g via INTRAVENOUS
  Filled 2023-05-12: qty 50

## 2023-05-12 MED ORDER — SUGAMMADEX SODIUM 200 MG/2ML IV SOLN
INTRAVENOUS | Status: DC | PRN
  Administered 2023-05-12: 200 mg via INTRAVENOUS

## 2023-05-12 MED ORDER — CEFAZOLIN SODIUM-DEXTROSE 2-4 GM/100ML-% IV SOLN
2.0000 g | INTRAVENOUS | Status: AC
  Administered 2023-05-12: 2 g via INTRAVENOUS

## 2023-05-12 MED ORDER — DEXMEDETOMIDINE HCL IN NACL 200 MCG/50ML IV SOLN
INTRAVENOUS | Status: DC | PRN
  Administered 2023-05-12 (×2): 10 ug via INTRAVENOUS

## 2023-05-12 MED ORDER — PROPOFOL 10 MG/ML IV BOLUS
INTRAVENOUS | Status: DC | PRN
Start: 2023-05-12 — End: 2023-05-12
  Administered 2023-05-12: 150 mg via INTRAVENOUS
  Administered 2023-05-12: 50 mg via INTRAVENOUS

## 2023-05-12 MED ORDER — CEFAZOLIN SODIUM-DEXTROSE 2-4 GM/100ML-% IV SOLN
INTRAVENOUS | Status: AC
  Filled 2023-05-12: qty 100

## 2023-05-12 MED ORDER — LIDOCAINE 2% (20 MG/ML) 5 ML SYRINGE
INTRAMUSCULAR | Status: AC
  Filled 2023-05-12: qty 5

## 2023-05-12 MED ORDER — ORAL CARE MOUTH RINSE: 15.0000 mL | Freq: Once | OROMUCOSAL | Status: AC

## 2023-05-12 MED ORDER — LIDOCAINE 2% (20 MG/ML) 5 ML SYRINGE
INTRAMUSCULAR | Status: DC | PRN
  Administered 2023-05-12: 100 mg via INTRAVENOUS

## 2023-05-12 MED ORDER — ONDANSETRON HCL 4 MG/2ML IJ SOLN
INTRAMUSCULAR | Status: AC
  Filled 2023-05-12: qty 2

## 2023-05-12 MED ORDER — DEXTROSE 50 % IV SOLN
INTRAVENOUS | Status: AC
  Filled 2023-05-12: qty 50

## 2023-05-12 MED ORDER — OXYCODONE HCL 5 MG PO TABS
5.0000 mg | ORAL_TABLET | Freq: Once | ORAL | Status: AC
  Administered 2023-05-12: 5 mg via ORAL

## 2023-05-12 MED ORDER — CHLORHEXIDINE GLUCONATE 0.12 % MT SOLN: 15.0000 mL | Freq: Once | OROMUCOSAL | Status: AC

## 2023-05-12 MED ORDER — MIDAZOLAM HCL 2 MG/2ML IJ SOLN
INTRAMUSCULAR | Status: DC | PRN
  Administered 2023-05-12: 5 mg via INTRAVENOUS

## 2023-05-12 MED ORDER — PROPOFOL 10 MG/ML IV BOLUS
INTRAVENOUS | Status: AC
  Filled 2023-05-12: qty 20

## 2023-05-12 MED ORDER — FENTANYL CITRATE (PF) 250 MCG/5ML IJ SOLN
INTRAMUSCULAR | Status: DC | PRN
  Administered 2023-05-12: 100 ug via INTRAVENOUS

## 2023-05-12 MED ORDER — FENTANYL CITRATE (PF) 250 MCG/5ML IJ SOLN
INTRAMUSCULAR | Status: AC
  Filled 2023-05-12: qty 5

## 2023-05-12 MED ORDER — SUCCINYLCHOLINE CHLORIDE 200 MG/10ML IV SOSY
PREFILLED_SYRINGE | INTRAVENOUS | Status: AC
  Filled 2023-05-12: qty 10

## 2023-05-12 MED ORDER — TRAMADOL HCL 50 MG PO TABS
50.0000 mg | ORAL_TABLET | Freq: Four times a day (QID) | ORAL | 0 refills | Status: DC | PRN
Start: 2023-05-12 — End: 2023-10-04

## 2023-05-12 MED ORDER — SODIUM CHLORIDE 0.9 % IV SOLN
INTRAVENOUS | Status: DC | PRN
Start: 2023-05-12 — End: 2023-05-12

## 2023-05-12 MED ORDER — DEXAMETHASONE SODIUM PHOSPHATE 10 MG/ML IJ SOLN
INTRAMUSCULAR | Status: DC | PRN
  Administered 2023-05-12: 10 mg via INTRAVENOUS

## 2023-05-12 SURGICAL SUPPLY — 40 items
ADH SKN CLS APL DERMABOND .7 (GAUZE/BANDAGES/DRESSINGS) ×1
APL PRP STRL LF DISP 70% ISPRP (MISCELLANEOUS)
BAG COUNTER SPONGE SURGICOUNT (BAG) ×1 IMPLANT
BAG SPNG CNTER NS LX DISP (BAG) ×1
BLADE CLIPPER SURG (BLADE) IMPLANT
CANISTER SUCT 3000ML PPV (MISCELLANEOUS) IMPLANT
CHLORAPREP W/TINT 26 (MISCELLANEOUS) ×1 IMPLANT
COVER SURGICAL LIGHT HANDLE (MISCELLANEOUS) ×1 IMPLANT
DERMABOND ADVANCED .7 DNX12 (GAUZE/BANDAGES/DRESSINGS) ×1 IMPLANT
DISSECTOR BLUNT TIP ENDO 5MM (MISCELLANEOUS) IMPLANT
ELECT REM PT RETURN 9FT ADLT (ELECTROSURGICAL) ×1
ELECTRODE REM PT RTRN 9FT ADLT (ELECTROSURGICAL) ×1 IMPLANT
GAUZE SPONGE 2X2 8PLY STRL LF (GAUZE/BANDAGES/DRESSINGS) IMPLANT
GLOVE BIOGEL PI IND STRL 7.5 (GLOVE) ×1 IMPLANT
GOWN STRL REUS W/ TWL LRG LVL3 (GOWN DISPOSABLE) ×2 IMPLANT
GOWN STRL REUS W/ TWL XL LVL3 (GOWN DISPOSABLE) ×1 IMPLANT
GOWN STRL REUS W/TWL LRG LVL3 (GOWN DISPOSABLE) ×1
GOWN STRL REUS W/TWL XL LVL3 (GOWN DISPOSABLE) ×1
IRRIG SUCT STRYKERFLOW 2 WTIP (MISCELLANEOUS)
IRRIGATION SUCT STRKRFLW 2 WTP (MISCELLANEOUS) IMPLANT
KIT BASIN OR (CUSTOM PROCEDURE TRAY) ×1 IMPLANT
KIT TURNOVER KIT B (KITS) ×1 IMPLANT
NS IRRIG 1000ML POUR BTL (IV SOLUTION) ×1 IMPLANT
PACK GENERAL/GYN (CUSTOM PROCEDURE TRAY) ×1 IMPLANT
PAD ARMBOARD 7.5X6 YLW CONV (MISCELLANEOUS) ×1 IMPLANT
SET TUBE SMOKE EVAC HIGH FLOW (TUBING) ×1 IMPLANT
SLEEVE Z-THREAD 5X100MM (TROCAR) IMPLANT
SPIKE FLUID TRANSFER (MISCELLANEOUS) ×1 IMPLANT
SUT ETHILON 5 0 PS 2 18 (SUTURE) IMPLANT
SUT MON AB 5-0 PS2 18 (SUTURE) IMPLANT
SUT VIC AB 2-0 UR6 27 (SUTURE) IMPLANT
SUT VIC AB 3-0 SH 27 (SUTURE) ×1
SUT VIC AB 3-0 SH 27X BRD (SUTURE) ×2 IMPLANT
SUT VICRYL 0 UR6 27IN ABS (SUTURE) ×2 IMPLANT
SUT VICRYL 4-0 PS2 18IN ABS (SUTURE) ×2 IMPLANT
TOWEL GREEN STERILE (TOWEL DISPOSABLE) ×1 IMPLANT
TOWEL GREEN STERILE FF (TOWEL DISPOSABLE) ×1 IMPLANT
TRAY LAPAROSCOPIC MC (CUSTOM PROCEDURE TRAY) ×1 IMPLANT
TROCAR Z THREAD OPTICAL 12X100 (TROCAR) ×1 IMPLANT
WATER STERILE IRR 1000ML POUR (IV SOLUTION) ×1 IMPLANT

## 2023-05-12 NOTE — Op Note (Signed)
Patient name: John [name] MRN: [id number] DOB: 08/09/1991 Sex: male  05/12/2023 Pre-operative Diagnosis: End-stage renal disease, inability to tolerate peritoneal dialysis Post-operative diagnosis:  Same Surgeon:  Luanna Salk. Randie Heinz, MD Assistant: Will Dabbs, MS3 Procedure Performed:  Removal of peritoneal dialysis catheter  Indications: 32 year old male with end-stage renal disease currently dialyzing via left upper arm AV fistula.  He has a peritoneal dialysis catheter in place but he has been unable to tolerate this.  He is now indicated for peritoneal dialysis catheter removal.  Findings: There is no evidence of infection.  Catheter was removed and was noted to be totally intact and the anterior fascia was closed with Vicryl suture.   Procedure:  The patient was identified in the holding area and taken to the operating room where is placed upon operating where general anesthesia was induced.  He was sterilely prepped and draped in the abdomen including the catheter in the usual fashion, antibiotics were administered a timeout was called.  Ultrasound was used to identify the 3 cuffs of the catheter.  I then made incisions opening the previous 2 incisions.  More cephalad I was able to dissected out the 2 cuffs and free up the catheter entirely.  Caudally we dissected down to the level of the fascia dissected through the fascia opened this up slightly identified the cuff and was able to remove this using scissors and cautery.  The catheter was then removed totally and was intact.  The anterior fascia was closed with 2-0 Vicryl on a UR 6 needle.  The wounds were then irrigated as well as the tunnel tract.  2 new incisions were closed with interrupted Vicryl and Monocryl at the skin level and Dermabond was placed above the skin.  Patient was awakened from anesthesia having tolerated the procedure well intermittent complication.  All counts were correct at completion.  EBL: 20 cc   Brayen Bunn C.  Randie Heinz, MD [facility] Office: [phone] Pager: [phone]

## 2023-05-12 NOTE — Anesthesia Preprocedure Evaluation (Signed)
Anesthesia Evaluation  Patient identified by MRN, date of birth, ID band Patient awake    Reviewed: Allergy & Precautions, NPO status , Patient's Chart, lab work & pertinent test results, reviewed documented beta blocker date and time   History of Anesthesia Complications (+) PONV, Family history of anesthesia reaction and history of anesthetic complications  Airway Mallampati: III  TM Distance: >3 FB Neck ROM: Full    Dental   Pulmonary neg shortness of breath, asthma , pneumonia, neg COPD, former smoker   breath sounds clear to auscultation       Cardiovascular METS: 3 - Mets hypertension, (-) angina + Past MI (elevated troponin on recent pre-DDKT workup)  (-) Cardiac Stents, (-) CABG and (-) Peripheral Vascular Disease  Rhythm:Regular Rate:Tachycardia     Neuro/Psych  Headaches, neg Seizures PSYCHIATRIC DISORDERS         GI/Hepatic ,neg GERD  ,,  Endo/Other  diabetes, Poorly Controlled, Type 1    Renal/GU ESRF and DialysisRenal disease     Musculoskeletal   Abdominal   Peds  Hematology  (+) Blood dyscrasia, anemia   Anesthesia Other Findings   Reproductive/Obstetrics                              Anesthesia Physical Anesthesia Plan  ASA: 3  Anesthesia Plan: General   Post-op Pain Management:    Induction: Intravenous  PONV Risk Score and Plan: 3 and TIVA and Ondansetron  Airway Management Planned: Oral ETT  Additional Equipment:   Intra-op Plan:   Post-operative Plan: Extubation in OR  Informed Consent: I have reviewed the patients History and Physical, chart, labs and discussed the procedure including the risks, benefits and alternatives for the proposed anesthesia with the patient or authorized representative who has indicated his/her understanding and acceptance.     Dental advisory given  Plan Discussed with:   Anesthesia Plan Comments: (Elevated troponin on 9/18  labs drawn in preparation for DDKT. ECG today shows possible previous infarct, no active ischemia. Patient asymptomatic - denies anginal or anginal equivalent symptoms. Capable of >4 METS. Planned to undergo stress test prior to transplant. Discussed uncertain significance of isolated elevated troponin level in the setting of ESRD as well as potential for elevated cardiac risk.  )         Anesthesia Quick Evaluation

## 2023-05-12 NOTE — Anesthesia Postprocedure Evaluation (Signed)
Anesthesia Post Note  Patient: John [name]  Procedure(s) Performed: REMOVAL CONTINUOUS AMBULATORY PERITONEAL DIALYSIS  (CAPD) CATHETER (Abdomen)     Patient location during evaluation: PACU Anesthesia Type: General Level of consciousness: awake and alert Pain management: pain level controlled Vital Signs Assessment: post-procedure vital signs reviewed and stable Respiratory status: spontaneous breathing, nonlabored ventilation and respiratory function stable Cardiovascular status: blood pressure returned to baseline and stable Postop Assessment: no apparent nausea or vomiting Anesthetic complications: no  No notable events documented.  Last Vitals:  Vitals:   05/12/23 1715 05/12/23 1730  BP: (!) 141/96 (!) 143/96  Pulse: 89 92  Resp: 16 17  Temp:    SpO2: 92% 91%    Last Pain:  Vitals:   05/12/23 1628  TempSrc:   PainSc: 0-No pain                 Mazell Aylesworth,W. EDMOND

## 2023-05-12 NOTE — Anesthesia Procedure Notes (Addendum)
Procedure Name: Intubation Date/Time: 05/12/2023 3:34 PM  Performed by: Pincus Large, CRNAPre-anesthesia Checklist: Patient identified, Emergency Drugs available, Suction available and Patient being monitored Patient Re-evaluated:Patient Re-evaluated prior to induction Oxygen Delivery Method: Circle System Utilized Preoxygenation: Pre-oxygenation with 100% oxygen Induction Type: IV induction Ventilation: Mask ventilation without difficulty Laryngoscope Size: Mac and 3 Grade View: Grade II Tube type: Oral Tube size: 8.0 mm Number of attempts: 1 Airway Equipment and Method: Stylet and Oral airway Placement Confirmation: ETT inserted through vocal cords under direct vision, positive ETCO2 and breath sounds checked- equal and bilateral Secured at: 23 cm Tube secured with: Tape Dental Injury: Teeth and Oropharynx as per pre-operative assessment

## 2023-05-12 NOTE — Transfer of Care (Signed)
Immediate Anesthesia Transfer of Care Note  Patient: John [name]  Procedure(s) Performed: REMOVAL CONTINUOUS AMBULATORY PERITONEAL DIALYSIS  (CAPD) CATHETER (Abdomen)  Patient Location: PACU  Anesthesia Type:General  Level of Consciousness: awake, alert , and oriented  Airway & Oxygen Therapy: Patient Spontanous Breathing and Patient connected to nasal cannula oxygen  Post-op Assessment: Report given to RN and Post -op Vital signs reviewed and stable  Post vital signs: Reviewed and stable  Last Vitals:  Vitals Value Taken Time  BP 108.72   Temp 97.4   Pulse 84 05/12/23 1629  Resp 28 05/12/23 1630  SpO2 100 % 05/12/23 1629  Vitals shown include unfiled device data.  Last Pain:  Vitals:   05/12/23 1026  TempSrc: Oral  PainSc: 0-No pain         Complications: No notable events documented.

## 2023-05-12 NOTE — Progress Notes (Signed)
Family called out requesting CBG, s/o states she's concerned his sugar is low because it has been trending down per his dexcom. CBG checked 105 from 111 one hour ago. Pt states he feels bad and has a headache behind his eye. Pt appear pale. Dr. Ace Gins with anesthesia notified with orders for repeat Istat. No significant changes noted from previous Istat this morning at 1045. Dr. Maple Hudson with anesthesia is taking over this case, results reported to him.

## 2023-05-12 NOTE — Discharge Instructions (Signed)
You may wash your andomin in 48 hours

## 2023-05-12 NOTE — H&P (Signed)
H+P  History of Present Illness: This is a 32 y.o. male with he is the catheter that was placed 4 months ago but he has significant discomfort and has elected to not do peritoneal dialysis any longer and we are planning to remove the catheter today.  Past Medical History:  Diagnosis Date   ADD (attention deficit disorder)    Allergic rhinitis    Anemia    hx ckd   Asthma    patient denies this dx, no inhalers   Chronic kidney disease    ESRD on dialysis TTHS   Complication of anesthesia    N/V   Diabetic ketoacidosis without coma associated with type 1 diabetes mellitus (HCC)    DKA (diabetic ketoacidoses)    Family history of adverse reaction to anesthesia    mother - nausea   Goiter, unspecified 02/04/2011   Headache    with elevate blood pressure   Hypertension    IDA (iron deficiency anemia)    Pneumonia    Possiblly 10/25/22-  he was told, from chest X-ray   PONV (postoperative nausea and vomiting)    Type I (juvenile type) diabetes mellitus without mention of complication, uncontrolled 02/04/2011   On Insulin    Past Surgical History:  Procedure Laterality Date   AV FISTULA PLACEMENT Left 10/25/2022   Procedure: CEPHALIC ARTERIOVENOUS (AV) FISTULA CREATION;  Surgeon: Maeola Harman, MD;  Location: [facility] OR;  Service: Vascular;  Laterality: Left;   CAPD INSERTION N/A 01/17/2023   Procedure: LAPAROSCOPIC INSERTION CONTINUOUS AMBULATORY PERITONEAL DIALYSIS  (CAPD) CATHETER;  Surgeon: Maeola Harman, MD;  Location: [facility] OR;  Service: Vascular;  Laterality: N/A;   EYE SURGERY Right 2024   INSERTION OF DIALYSIS CATHETER Right 10/25/2022   Procedure: INSERTION OF TUNNELED PALINDROME 14.5 FR X 19 CM DIALYSIS CATHETER;  Surgeon: Maeola Harman, MD;  Location: [facility] OR;  Service: Vascular;  Laterality: Right;   TONSILLECTOMY      No Known Allergies  Prior to Admission medications   Medication Sig Start Date End Date Taking? Authorizing Provider   acetaminophen (TYLENOL) 325 MG tablet Take 2 tablets (650 mg total) by mouth every 6 (six) hours as needed for mild pain (or Fever >/= 101). 10/29/22  Yes Elgergawy, Leana Roe, MD  acetaminophen (TYLENOL) 500 MG tablet Take 1,000 mg by mouth every 6 (six) hours as needed for mild pain or moderate pain.   Yes [provider]  amLODipine (NORVASC) 10 MG tablet Take 1 tablet (10 mg total) by mouth daily. Patient taking differently: Take 10 mg by mouth every evening. 10/29/22  Yes Elgergawy, Leana Roe, MD  bismuth subsalicylate (PEPTO BISMOL) 262 MG chewable tablet Chew 524 mg by mouth daily as needed for indigestion or diarrhea or loose stools.   Yes [provider]  Calcium Carbonate Antacid (TUMS PO) Take 1 tablet by mouth 2 (two) times daily as needed (heartburn).   Yes [provider]  insulin aspart (NOVOLOG) 100 UNIT/ML injection Inject 5-10 Units into the skin 4 (four) times daily -  with meals and at bedtime. Sliding scale Carb coverage and correction   Yes [provider]  insulin glargine (LANTUS) 100 UNIT/ML injection Inject 20-25 Units into the skin at bedtime. Sliding scale   Yes [provider]  labetalol (NORMODYNE) 100 MG tablet Take 1 tablet (100 mg total) by mouth 2 (two) times daily. 10/29/22  Yes Elgergawy, Leana Roe, MD  Melatonin 10 MG CHEW Chew 20 mg by mouth at  bedtime as needed (Sleep).   Yes [provider]  RENVELA 800 MG tablet Take 1,600 mg by mouth 3 (three) times daily. 01/11/23  Yes [provider]  simethicone (MYLICON) 80 MG chewable tablet Chew 80 mg by mouth every 6 (six) hours as needed for flatulence.   Yes [provider]  glucose blood (ONE TOUCH ULTRA TEST) test strip Use as instructed 3x daily 05/04/17   Dolores Patty C, DO  Insulin Pen Needle 31G X 8 MM MISC 1 each by Does not apply route 4 (four) times daily -  with meals and at bedtime. 03/24/15   Hoy Register, MD  Multiple Vitamins-Minerals  (PRESERVISION AREDS 2 PO) Take 1 tablet by mouth daily.    [provider]  traMADol (ULTRAM) 50 MG tablet Take 1 tablet (50 mg total) by mouth every 6 (six) hours as needed. Patient not taking: Reported on 05/02/2023 01/17/23 01/17/24  Ernestene Mention, PA-C    Social History   Socioeconomic History   Marital status: Single    Spouse name: Not on file   Number of children: Not on file   Years of education: Not on file   Highest education level: Not on file  Occupational History   Not on file  Tobacco Use   Smoking status: Former    Current packs/day: 0.00    Types: Cigarettes    Quit date: 12/26/2022    Years since quitting: 0.3   Smokeless tobacco: Former    Quit date: 06/26/2013  Vaping Use   Vaping status: Never Used  Substance and Sexual Activity   Alcohol use: Not Currently    Comment: none since 10/2022   Drug use: Yes    Frequency: 7.0 times per week    Types: Marijuana    Comment: smokes, every day, last use 05/09/23   Sexual activity: Yes  Other Topics Concern   Not on file  Social History Narrative   Regular exercise-yes   Social Determinants of Health   Financial Resource Strain: Low Risk  (01/26/2023)   Received from [facility]   Overall Financial Resource Strain (CARDIA)    Difficulty of Paying Living Expenses: Not very hard  Food Insecurity: No Food Insecurity (01/26/2023)   Received from [facility]   Hunger Vital Sign    Worried About Running Out of Food in the Last Year: Never true    Ran Out of Food in the Last Year: Never true  Transportation Needs: No Transportation Needs (01/26/2023)   Received from [facility]    Lack of Transportation (Medical): No    Lack of Transportation (Non-Medical): No  Physical Activity: Not on file  Stress: Not on file  Social Connections: Unknown (12/29/2022)   Received from [facility], [facility]   Social Network    Social Network: Not on file  Intimate Partner Violence:  Unknown (12/29/2022)   Received from [facility], [facility]   HITS    Physically Hurt: Not on file    Insult or Talk Down To: Not on file    Threaten Physical Harm: Not on file    Scream or Curse: Not on file    Family History  Problem Relation Age of Onset   Lupus Cousin    Cancer Neg Hx     ROS: No complaints  Physical Examination  Vitals:   05/12/23 1026  BP: (!) 170/108  Pulse: (!) 106  Resp: 18  Temp: 99 F (37.2  C)  SpO2: 92%   Body mass index is 27.84 kg/m.  Awake alert oriented Nonlabored respirations Abdomen is soft mildly distended with fluid catheter is easily palpable and marked on the skin   CBC    Component Value Date/Time   WBC 10.0 10/29/2022 0316   RBC 2.72 (L) 10/29/2022 0316   HGB 9.9 (L) 05/12/2023 1425   HCT 29.0 (L) 05/12/2023 1425   PLT 370 10/29/2022 0316   MCV 90.1 10/29/2022 0316   MCH 30.1 10/29/2022 0316   MCHC 33.5 10/29/2022 0316   RDW 13.9 10/29/2022 0316   LYMPHSABS 1.8 10/21/2022 0753   MONOABS 1.0 10/21/2022 0753   EOSABS 0.2 10/21/2022 0753   BASOSABS 0.1 10/21/2022 0753    BMET    Component Value Date/Time   NA 137 05/12/2023 1425   K 4.7 05/12/2023 1425   CL 99 05/12/2023 1425   CO2 28 10/29/2022 0316   GLUCOSE 108 (H) 05/12/2023 1425   BUN 42 (H) 05/12/2023 1425   CREATININE 7.30 (H) 05/12/2023 1425   CALCIUM 7.8 (L) 10/29/2022 0316   GFRNONAA 13 (L) 10/29/2022 0316   GFRAA >60 03/02/2020 1012    COAGS: Lab Results  Component Value Date   INR 1.20 07/08/2014     ASSESSMENT/PLAN: This is a 32 y.o. male no longer in need of peritoneal dialysis.  He is on dialysis via left upper arm AV fistula which is working well.  Plan to remove PD catheter today and  Westlynn Fifer C. Randie Heinz, MD [facility] Office: [phone] Pager: [phone]

## 2023-05-12 NOTE — Progress Notes (Signed)
CBG 291. Per Dr. Ace Gins to recheck in an hour since pt received 7 units of Nov at home around 0930.

## 2023-05-13 ENCOUNTER — Other Ambulatory Visit: Payer: Self-pay

## 2023-05-13 ENCOUNTER — Encounter (HOSPITAL_COMMUNITY): Payer: Self-pay | Admitting: Vascular Surgery

## 2023-10-04 NOTE — Progress Notes (Signed)
 Current Outpatient Medications  Medication Instructions  . acetaminophen  (TYLENOL ) 650 mg, oral, Every 6 hours PRN  . amLODIPine  (NORVASC ) 10 mg tablet 1 tablet, oral, Daily  . atropine  1 % ophthalmic solution   . blood-glucose sensor (Dexcom G6 Sensor) 1 each, miscellaneous, Daily  . calcium  carbonate (TUMS EX) 300 mg (750 mg) chewable tablet oral, per protocol  . erythromycin  (ROMYCIN) 5 mg/gram (0.5 %) ophthalmic ointment   . gentamicin (GARAMYCIN) 0.1 % cream   . insulin  aspart U-100 (NOVOLOG ) 0-12 Units, subcutaneous, 3 times daily before meals, Per sliding scale  . insulin  glargine (LANTUS  U-100 INSULIN ) 20-25 Units, subcutaneous, At bedtime  . labetaloL  (NORMODYNE ) 100 mg, oral, 2 times daily  . prednisoLONE  acetate (PRED FORTE ) 1 % ophthalmic suspension 1 drop, ophthalmic  . propylene glycol (SYSTANE BALANCE OPHT) 1 drop, ophthalmic, Daily PRN  . Renvela 1,600 mg, oral, 3 times daily with meals  . tetrahydrozoline (Visine) 0.05 % drop 2 drops, ophthalmic      Hospital Outpatient Visit on 09/20/2023  Component Date Value Ref Range Status  . Amphetamines, IA 09/20/2023 Negative  Cutoff:50 ng/mL Final  . Barbiturates, IA 09/20/2023 Negative  Cutoff:0.1 ug/mL Final  . Benzodiazepines, IA 09/20/2023 Negative  Cutoff:20 ng/mL Final  . Cocaine & Metabolite, IA 09/20/2023 Negative  Cutoff:25 ng/mL Final  . Phenycyclidine, IA 09/20/2023 Negative  Cutoff:8 ng/mL Final  . THC (Marijuana) Metabolite, IA 09/20/2023 ++POSITIVE++ (A)  Cutoff:5 ng/mL Final  . Opiates, IA 09/20/2023 Negative  Cutoff:5 ng/mL Final  . Oxycodones, IA 09/20/2023 Negative  Cutoff:5 ng/mL Final  . Methadone, IA 09/20/2023 Negative  Cutoff:25 ng/mL Final  . Propoxyphene, IA 09/20/2023 Negative  Cutoff:50 ng/mL Final   This test was developed and its performance characteristics determined by [facility].  It has not been cleared or approved by the Food and Drug Administration.  . Cumulative Antibodies 09/20/2023 None    Final  . Phenotype 09/20/2023 A1 A26 B8 B38 Bw6 Bw4 Cw7 Cw12 DR4 DR17 DR53 DR52 DQA1*03:01 DQA1*05:01 DQ2 DQ8 DPA1*01:03 DPA1*- DPB1*04:01 DPB1*-   Final  . Sample Draw date 09/20/2023 2023-09-20   Final  . Class I Antibody Specificities 09/20/2023 Negative   Final  . Class II Antibody Specificities 09/20/2023 Negative   Final  . Disclaimer (FDA) 09/20/2023    Final                   Value:HLA typing is performed by intermediate to high resolution SSP or NGS.   Crossmatches are performed by flow cytometry.  All procedures and reagents have been validated and performance characteristics determined by the [facility].   Certain of these tests have not been cleared/approved by the U.S. Food and Drug Administration.  The FDA has determined that such approval is not necessary because this laboratory is certified under the Clinical Laboratory Improvement Amendments to  perform high complexity testing.  Additional information may be obtained by contacting the laboratory.  Energy manager (CLIA and Lab Director) 09/20/2023    Final                   Value:THIS LABORATORY IS CERTIFIED UNDER THE CLINICAL LABORATORY IMPROVEMENT AMENDMENTS OF 1988 (CLIA) AS QUALIFIED TO PERFORM HIGH COMPLEXITY CLINICAL TESTING. Reviewed and approved by: Michael D. Lamonte, Ph.D., F.ACHI Director, [facility] and  [facility]agnostics Laboratory.  . Screening Test date 09/20/2023 2023-09-25   Final  . Cannabinoid Confirmation 09/20/2023 Positive   Final  . Tetrahydrocannabinol (THC) 09/20/2023 Negative  ng/mL Final  . Shannan 09/20/2023  1.4  ng/mL Final  . Hydroxy-THC 09/20/2023 Negative  ng/mL Final  . Cannabidiol 09/20/2023 Negative  ng/mL Final   Confirmation threshold: 1.0 ng/mL  Hospital Outpatient Visit on 08/02/2023  Component Date Value Ref Range Status  . Cumulative Antibodies 08/02/2023 None   Final  . Phenotype 08/02/2023 A1 A26 B8 B38 Bw6 Bw4 Cw7 Cw12 DR4 DR17 DR53 DR52 DQA1*03:01  DQA1*05:01 DQ2 DQ8 DPA1*01:03 DPA1*- DPB1*04:01 DPB1*-   Final  . Sample Draw date 08/02/2023 2023-08-02   Final  . Class I Antibody Specificities 08/02/2023 Negative   Final  . Class II Antibody Specificities 08/02/2023 Negative   Final  . Disclaimer (FDA) 08/02/2023    Final                   Value:HLA typing is performed by intermediate to high resolution SSP or NGS.   Crossmatches are performed by flow cytometry.  All procedures and reagents have been validated and performance characteristics determined by the [facility].   Certain of these tests have not been cleared/approved by the U.S. Food and Drug Administration.  The FDA has determined that such approval is not necessary because this laboratory is certified under the Clinical Laboratory Improvement Amendments to  perform high complexity testing.  Additional information may be obtained by contacting the laboratory.  Energy manager (CLIA and Lab Director) 08/02/2023    Final                   Value:THIS LABORATORY IS CERTIFIED UNDER THE CLINICAL LABORATORY IMPROVEMENT AMENDMENTS OF 1988 (CLIA) AS QUALIFIED TO PERFORM HIGH COMPLEXITY CLINICAL TESTING. Reviewed and approved by: Michael D. Lamonte, Ph.D., F.ACHI Director, [facility]ogenetics and  Immunodiagnostics Laboratory.  . Screening Test date 08/02/2023 2023-08-18   Final  . Amphetamines, IA 08/02/2023 Negative  Cutoff:50 ng/mL Final  . Barbiturates, IA 08/02/2023 Negative  Cutoff:0.1 ug/mL Final  . Benzodiazepines, IA 08/02/2023 Negative  Cutoff:20 ng/mL Final  . Cocaine & Metabolite, IA 08/02/2023 Negative  Cutoff:25 ng/mL Final  . Phenycyclidine, IA 08/02/2023 Negative  Cutoff:8 ng/mL Final  . THC (Marijuana) Metabolite, IA 08/02/2023 Negative  Cutoff:5 ng/mL Final  . Opiates, IA 08/02/2023 Negative  Cutoff:5 ng/mL Final   Presumptive immunoassay result indicated need for further testing; definitive confirmation was negative.  . Oxycodones, IA 08/02/2023  Negative  Cutoff:5 ng/mL Final   Presumptive immunoassay result indicated need for further testing; definitive confirmation was negative.  . Methadone, IA 08/02/2023 Negative  Cutoff:25 ng/mL Final  . Propoxyphene, IA 08/02/2023 Negative  Cutoff:50 ng/mL Final   This test was developed and its performance characteristics determined by [facility].  It has not been cleared or approved by the Food and Drug Administration.  . Opiate Confirmation 08/02/2023 Negative   Final  . Codeine 08/02/2023 Negative  ng/mL Final  . Morphine  08/02/2023 Negative  ng/mL Final  . 6-Acetylmorphine 08/02/2023 Negative   Final  . Hydromorphone  08/02/2023 Negative  ng/mL Final  . Dihydrocodeine 08/02/2023 Negative  ng/mL Final   Confirmation threshold: 1.0 ng/mL  . Hydrocodone  08/02/2023 Negative  ng/mL Final  . Oxycodones Confirmation 08/02/2023 Negative   Final  . Oxycodone  08/02/2023 Negative  ng/mL Final  . Oxymorphone 08/02/2023 Negative  ng/mL Final   Confirmation threshold: 1.0 ng/mL  Lab on 05/10/2023  Component Date Value Ref Range Status  . Homocysteine 05/10/2023 20.0 (H)  <15.0 umol/L Final  . Fibrinogen 05/10/2023 259  200 - 400 mg/dL Final  . Protein C Activity 05/10/2023 103  >=70 % Final  .  Free Protein S Antigen 05/10/2023 109  >=73 % Final  . Anti-thrombin III Activity 05/10/2023 118  >=75 % Final  . Factor V Leiden Mutation 05/10/2023 Not Present  Not Present Final  . Thrombophilia Genotype Interpretat* 05/10/2023 This genotype does not identify an increased risk of thrombosis.   Final  . Special Hematology Signout MD 05/10/2023 Interpretation of results performed by Dr. Prentice Nephew   Final  . Factor II 20210 Mutation 05/10/2023 Not Present  Not Present Final  . Thrombophilia Genotype Interpretat* 05/10/2023 This genotype does not identify an increased risk of thrombosis.   Final  . Special Hematology Signout MD 05/10/2023 Interpretation of results performed by Dr. Prentice Nephew   Final   . Isohemagglutinin 05/10/2023 IgG Anti A titer is 4   Final  . BB ABID Path Review Needed 05/10/2023 YES   Final  . Antibody Screen 05/10/2023 NEG   Final  . Cholesterol, Total, Lipid Panel 05/10/2023 91  <200 mg/dL Final   NCEP III Guidelines   Total Cholesterol             Risk Classification                           <200 mg/dL                      Desirable    200-239 mg/dL                   Borderline High    >240 mg/dL                      High  . Triglycerides, Lipid Panel 05/10/2023 39  <150 mg/dL Final   NCEP-ATP III Guidelines    Triglyceride Value           Risk Classification                           <150 mg/dL                   Normal    150-199 mg/dL                Borderline High    200-499 mg/dL                High    >=500 mg/dL                  Very High   . HDL Cholesterol - Lipid Panel 05/10/2023 42 (L)  >=60 mg/dL Final   NCEP III Guidelines    HDLc                     Risk Classification                           >=60 mg/dL                  Optimal    >=40 mg/dL                  Desirable    <40 mg/dL                   Low      . LDL Cholesterol, Calculated 05/10/2023 38  <100 mg/dL Final   LDL Cholesterol is calculated  using the Martin/Hopkins equation. Source:  JAMA 2013; 310:  2061-68. NCEP-ATP III Guidelines    LDLc                     Risk Classification                           <100 mg/dL                   Optimal    100-129 mg/dL                Desirable    130-159 mg/dL                Borderline High    160-189 mg/dL                High    >190 mg/dL                   Very High   . Non-HDL Cholesterol 05/10/2023 49  mg/dL Final  . GGT 90/81/7975 38  9 - 64 U/L Final  . Lactate Dehydrogenase (LDH) 05/10/2023 215  140 - 271 U/L Final  . Magnesium 05/10/2023 2.3  1.9 - 2.7 mg/dL Final  . Phosphorus 90/81/7975 6.7 (H)  2.5 - 5.0 mg/dL Final  . Sodium 90/81/7975 140  136 - 145 mmol/L Final  . Potassium 05/10/2023 4.8  3.5 - 5.1 mmol/L  Final   NO VISIBLE HEMOLYSIS  . Chloride 05/10/2023 100  98 - 107 mmol/L Final  . CO2 05/10/2023 28  21 - 31 mmol/L Final  . Anion Gap 05/10/2023 12  6 - 14 mmol/L Final  . Glucose, Random 05/10/2023 137 (H)  70 - 99 mg/dL Final  . Blood Urea Nitrogen (BUN) 05/10/2023 55 (H)  7 - 25 mg/dL Final  . Creatinine 90/81/7975 8.06 (H)  0.70 - 1.30 mg/dL Final  . eGFR 90/81/7975 8 (L)  >59 mL/min/1.66m2 Final   GFR estimated by CKD-EPI equations(NKF 2021).   Recommend confirmation of Cr-based eGFR by using Cys-based eGFR and other filtration markers (if applicable) in complex cases and clinical decision-making, as needed.  . Albumin  05/10/2023 4.2  3.5 - 5.7 g/dL Final  . Total Protein 05/10/2023 6.3 (L)  6.4 - 8.9 g/dL Final  . Bilirubin, Total 05/10/2023 0.3  0.3 - 1.0 mg/dL Final  . Alkaline Phosphatase (ALP) 05/10/2023 79  34 - 104 U/L Final  . Aspartate Aminotransferase (AST) 05/10/2023 11 (L)  13 - 39 U/L Final  . Alanine Aminotransferase (ALT) 05/10/2023 20  7 - 52 U/L Final  . Calcium  05/10/2023 9.2  8.6 - 10.3 mg/dL Final  . BUN/Creatinine Ratio 05/10/2023 6.8 (L)  10.0 - 20.0 Final   Potential intrarenal damage  . Type & Rh (ABORH) 05/10/2023 B Positive   Final  . Hepatitis A Virus Antibody, Total 05/10/2023 Non-Reactive  Non-Reactive Final  . Hepatitis B Surface Antibody, Qual* 05/10/2023 NONREACTIVE (EQUIVALENT TO <10 mIU/ml)  NONREACTIVE (EQUIVALENT TO <10 mIU/ml) Final  . Hepatitis B Surface Antigen Screen* 05/10/2023 Non-Reactive  Non-Reactive Final  . Varicella Zoster Antibody, IgG 05/10/2023 Positive    Final  . Cytomegalovirus (CMV) Antibody, IgG 05/10/2023 Positive (A)  Negative Final  . Elbert Shine Virus VCA Antibody, I* 05/10/2023 <36.0  0.0 - 35.9 U/mL Final  Negative        <36.0                                  Equivocal 36.0 - 43.9                                  Positive        >43.9  . Elbert Shine Virus VCA Antibody, I*  05/10/2023 160.0 (H)  0.0 - 17.9 U/mL Final                                    Negative        <18.0                                  Equivocal 18.0 - 21.9                                  Positive        >21.9  . Elbert Shine Virus Nuclear Antigen* 05/10/2023 60.3 (H)  0.0 - 17.9 U/mL Final                                    Negative        <18.0                                  Equivocal 18.0 - 21.9                                  Positive        >21.9  . Interpretation: 05/10/2023 Comment   Final                  EBV Interpretation Chart Key: Antibody Present +    Antibody Absent - Interpretation             VCA-IgM   VCA-IgG  EBNA-IgG No previous infection/        -         -         - Susceptible Primary infection (new        +         +         - or recent) Past Infection               +or-       +         + See comment below*            +         -         - *Results indicate infection with EBV at some time  however cannot predict the timing of the infection  since antibodies to EBNA usually develop after  primary infection or, alternatively, approximately  5-10% of patients with EBV never develop antibodies  to EBNA.  . Toxoplasma gondii Antibodies,  IgG 05/10/2023 <3.0  0.0 - 7.1 IU/mL Final                                    Negative        <7.2                                  Equivocal  7.2 - 8.7                                  Positive        >8.7  . Toxoplasma gondii Antibodies, IgM 05/10/2023 <3.0  0.0 - 7.9 AU/mL Final                                Negative            <8.0                              Equivocal      8.0 - 9.9                              Positive            >9.9  . Comment: 05/10/2023 Comment   Final   No serological evidence of infection with Toxoplasma. If symptoms persist, submit a new specimen after three weeks.  . Auto Overall T-Cell Interpretation 05/10/2023 NEG   Final  . Auto Overall B-Cell Interpretation 05/10/2023 NEG   Final  . Serum  date1 05/10/2023 2023-05-10   Final  . Test Date 05/10/2023 2023-05-11   Final  . Disclaimer (FDA) 05/10/2023    Final                   Value:HLA typing is performed by intermediate to high resolution SSP or NGS.   Crossmatches are performed by flow cytometry.  All procedures and reagents have been validated and performance characteristics determined by the [facility].   Certain of these tests have not been cleared/approved by the U.S. Food and Drug Administration.  The FDA has determined that such approval is not necessary because this laboratory is certified under the Clinical Laboratory Improvement Amendments to  perform high complexity testing.  Additional information may be obtained by contacting the laboratory.  Energy manager (CLIA and Lab Director) 05/10/2023    Final                   Value:THIS LABORATORY IS CERTIFIED UNDER THE CLINICAL LABORATORY IMPROVEMENT AMENDMENTS OF 1988 (CLIA) AS QUALIFIED TO PERFORM HIGH COMPLEXITY CLINICAL TESTING. Reviewed and approved by: Michael D. Lamonte, Ph.D., F.ACHI Director, [facility]  [facility]ostics [facility].  . Locus A 1 05/10/2023 A*01:01   Final  . Locus A 2 05/10/2023 A*26:01   Final  . Locus B 1 05/10/2023 B*08:01   Final  . Locus B 2 05/10/2023 B*38:01   Final  . Locus C 1 05/10/2023 C*07:01   Final  . Locus C 2 05/10/2023 C*12:03   Final  . Locus DRB1 1 05/10/2023 DRB1*03:01P   Final  . Locus DRB1 2 05/10/2023 DRB1*04:02P  Final  . Locus DRB345 1 05/10/2023 DRB3*01:01P   Final  . Locus DRB345 2 05/10/2023 DRB4*01:01P   Final  . Locus DQA1 1 05/10/2023 DQA1*03:01   Final  . Locus DQA1 2 05/10/2023 DQA1*05:01   Final  . Locus DQB1 1 05/10/2023 DQB1*02:01   Final  . Locus DQB1 2 05/10/2023 DQB1*03:02   Final  . Locus DPA1  1 05/10/2023 DPA1*01:03   Final  . Locus DPA1 2 05/10/2023 DPA1*-   Final  . Locus DPB1 1 05/10/2023 DPB1*04:01   Final  . Locus DPB1 2 05/10/2023 DPB1*-   Final  . Typing Test  Date 05/10/2023 2023-05-19   Final  . Sample Draw date 05/10/2023 2023-05-10   Final  . Disclaimer (FDA) 05/10/2023    Final                   Value:HLA typing is performed by intermediate to high resolution SSP or NGS.   Crossmatches are performed by flow cytometry.  All procedures and reagents have been validated and performance characteristics determined by the [facility].   Certain of these tests have not been cleared/approved by the U.S. Food and Drug Administration.  The FDA has determined that such approval is not necessary because this laboratory is certified under the Clinical Laboratory Improvement Amendments to  perform high complexity testing.  Additional information may be obtained by contacting the laboratory.  Energy manager (CLIA and Lab Director) 05/10/2023    Final                   Value:THIS LABORATORY IS CERTIFIED UNDER THE CLINICAL LABORATORY IMPROVEMENT AMENDMENTS OF 1988 (CLIA) AS QUALIFIED TO PERFORM HIGH COMPLEXITY CLINICAL TESTING. Reviewed and approved by: Michael D. Lamonte, Ph.D., F.ACHI Director, [facility]etics and  Immunodiagnostics Laboratory.  . Cumulative Antibodies 05/10/2023 None   Final  . Phenotype 05/10/2023 A1 A26 B8 B38 Bw6 Bw4 Cw7 Cw12 DR4 DR17 DR53 DR52 DQA1*03:01 DQA1*05:01 DQ2 DQ8 DPA1*01:03 DPA1*- DPB1*04:01 DPB1*-   Final  . Sample Draw date 05/10/2023 2023-05-10   Final  . Class I Antibody Specificities 05/10/2023 Negative   Final  . Class II Antibody Specificities 05/10/2023 Negative   Final  . Disclaimer (FDA) 05/10/2023    Final                   Value:HLA typing is performed by intermediate to high resolution SSP or NGS.   Crossmatches are performed by flow cytometry.  All procedures and reagents have been validated and performance characteristics determined by the [facility]etics Laboratory.   Certain of these tests have not been cleared/approved by the U.S. Food and Drug Administration.  The FDA has determined that  such approval is not necessary because this laboratory is certified under the Clinical Laboratory Improvement Amendments to  perform high complexity testing.  Additional information may be obtained by contacting the laboratory.  Energy manager (CLIA and Lab Director) 05/10/2023    Final                   Value:THIS LABORATORY IS CERTIFIED UNDER THE CLINICAL LABORATORY IMPROVEMENT AMENDMENTS OF 1988 (CLIA) AS QUALIFIED TO PERFORM HIGH COMPLEXITY CLINICAL TESTING. Reviewed and approved by: Michael D. Lamonte, Ph.D., F.ACHI Director, [facility]etics and  Immunodiagnostics Laboratory.  . Screening Test date 05/10/2023 2023-05-19   Final  . B-Type Natriuretic Peptide (BNP) 05/10/2023 1,365 (H)  <100 pg/mL Final  . Amphetamines, IA 05/10/2023 Negative  Cutoff:50 ng/mL Final  . Barbiturates, IA 05/10/2023 Negative  Cutoff:0.1 ug/mL Final  . Benzodiazepines, IA 05/10/2023 ++POSITIVE++ (A)  Cutoff:20 ng/mL Final  . Cocaine & Metabolite, IA 05/10/2023 Negative  Cutoff:25 ng/mL Final  . Phenycyclidine, IA 05/10/2023 Negative  Cutoff:8 ng/mL Final  . THC (Marijuana) Metabolite, IA 05/10/2023 ++POSITIVE++ (A)  Cutoff:5 ng/mL Final  . Opiates, IA 05/10/2023 ++POSITIVE++ (A)  Cutoff:5 ng/mL Final  . Oxycodones, IA 05/10/2023 Negative  Cutoff:5 ng/mL Final   Presumptive immunoassay result indicated need for further testing; definitive confirmation was negative.  . Methadone, IA 05/10/2023 Negative  Cutoff:25 ng/mL Final  . Propoxyphene, IA 05/10/2023 Negative  Cutoff:50 ng/mL Final   This test was developed and its performance characteristics determined by [facility].  It has not been cleared or approved by the Food and Drug Administration.  . Quantiferon Incubation 05/10/2023 Incubation performed.   Final  . Quantiferon-TB Gold Plus 05/10/2023 Negative  Negative Final   No response to M tuberculosis antigens detected. Infection with M tuberculosis is unlikely, but high risk individuals should be  considered for additional testing (ATS/IDSA/CDC Clinical Practice Guidelines, 2017). The reference range is an Antigen minus Nil result of <0.35 IU/mL. Chemiluminescence immunoassay methodology  . PSA, Total 05/10/2023 0.15  ng/mL Final   Results are obtained using a Beckman Coulter's chemiluminescent immunoassay.  Patient results determined by assays using a different manufacturer and/or method may not be comparable.  . Hemoglobin A1c 05/10/2023 7.8 (H)  <5.7 % Final   Normal A1c:             Less than 5.7%  Prediabetes range A1c:  5.7% to 6.4%  Diabetes range A1c:     Greater than 6.4%  . Estimated Average Glucose 05/10/2023 177  mg/dL Final   The ADA has supported the calculation of Average Glucose (eAG) based on HbA1c measurements. However, the eAG reflects the average glycemic level over 2-3 months and it would not necessarily match single glucose laboratory measurements, nor reflect changes in the daily glucose concentration.  . Strongyloides IgG Antibody 05/10/2023 Negative  Negative Final  . Parathyroid  Hormone (PTH), Intact 05/10/2023 182 (H)  12 - 88 pg/mL Final  . C-Peptide 05/10/2023 0.1 (L)  0.8 - 3.9 ng/mL Final  . Iron 05/10/2023 45 (L)  50 - 212 ug/dL Final  . Transferrin 90/81/7975 219  203 - 362 mg/dL Final  . Ferritin 90/81/7975 292  24 - 336 ng/mL Final  . Total Iron Binding Capacity (TIBC) 05/10/2023 313  290 - 518 ug/dL Final  . Transferrin Saturation 05/10/2023 14 (L)  15 - 45 % Final  . Lipase 05/10/2023 45  11 - 82 U/L Final  . Amylase 05/10/2023 46  29 - 103 U/L Final  . Uric Acid 05/10/2023 6.3  4.4 - 7.6 mg/dL Final  . Prothrombin Time (PT) 05/10/2023 11.3  8.9 - 12.1 seconds Final  . International Normalized Ratio (IN* 05/10/2023 1.0  <5.0 Final  . Activated Partial Thromboplastin T* 05/10/2023 26.4  <=30.0 seconds Final  . Troponin T(Highly Sensitive) 05/10/2023 255 (H)  0 - 22 ng/L Final   In order to distinguish acute elevations of high sensitive Troponin  from other clinical conditions, the Universal Definition of myocardial infarction stresses clinical assessment and the need for serial measurements to observe a rise and/or fall above the upper limit of the reference interval.  . RPR 05/10/2023 Non-Reactive  Non-Reactive Final  . Hepatitis C Virus Antibody 05/10/2023 Non-Reactive  Non-Reactive Final   The performance of the [employer] HCV assay has not been established with  neonatal specimens.  SABRA HIV 1/2 Antibody, HIV p24 Antigen 05/10/2023 Non-Reactive  Non-Reactive Final   A negative result does not rule out HIV infection. Repeat testing in 2-4 weeks if HIV infection is clinically suspected. If recent HIV exposure is suspected, request an HIV viral load ([id number] Human Immunodeficiency Virus (HIV) 1 Quantitative Real-Time PCR, Plasma).   This test should not be used to test neonates. The performance of the [employer] HIV-1/2 Ag/Ab Combo assay has not been established with cord blood, neonatal specimens, cadaver specimens, heat-inactivated specimens, or body fluids other than serum such as plasma, saliva, urine, amniotic or pleural fluids.  . WBC 05/10/2023 8.30  4.40 - 11.00 10*3/uL Final  . RBC 05/10/2023 3.45 (L)  4.50 - 5.90 10*6/uL Final  . Hemoglobin 05/10/2023 10.3 (L)  14.0 - 17.5 g/dL Final  . Hematocrit 90/81/7975 30.7 (L)  41.5 - 50.4 % Final  . Mean Corpuscular Volume (MCV) 05/10/2023 89.1  80.0 - 96.0 fL Final  . Mean Corpuscular Hemoglobin (MCH) 05/10/2023 29.9  27.5 - 33.2 pg Final  . Mean Corpuscular Hemoglobin Conc (* 05/10/2023 33.6  33.0 - 37.0 g/dL Final  . Red Cell Distribution Width (RDW) 05/10/2023 14.9  12.3 - 17.0 % Final  . Platelet Count (PLT) 05/10/2023 274  150 - 450 10*3/uL Final  . Mean Platelet Volume (MPV) 05/10/2023 8.4  6.8 - 10.2 fL Final  . Hepatitis B Core Antibody, IgM 05/10/2023 Non-Reactive  Non-Reactive Final   The performance of the [employer] Hepatitis B Core IgM assay has not been  established with neonatal specimens.  . Path Interpretation Blood Bank Tes* 05/10/2023    Final                   Value:Anti-A (see previous blood bank note on 03/06/23)  The patient is a 33 y.o. 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 Blood bank was consulted for antibody titer evaluation.  Anti A can occur as an isoagglutinnin in the serum of group B patients. Both IgM and IgG anti-A preferentially agglutinates red cells at room temperature (20-24 degrees C) or cooler and can efficiently activate complement at 37 degrees C. This patient's past anti-A titer was 4 on 03/07/23. Their most recent IgG anti-A titer is 4 on 05/10/23.    Prentice Conn, DO PGY-1  I have made editions/changes where appropriate and agree with the documentation.    Electronically signed by Durwood [place].  SABRA Mary of Service 05/10/2023 [facility] [facility] [place], CLIA# [id number], [facility], [place] [address]   Final  . Quantiferon Criteria 05/10/2023 Comment   Final   QuantiFERON-TB Viviane Plus is a qualitative indirect test for M tuberculosis infection (including disease) and is intended for use in conjunction with risk assessment, radiography, and other medical and diagnostic evaluations. The QuantiFERON-TB Gold Plus result is determined by subtracting the Nil value from either TB antigen (Ag) value. The Mitogen tube serves as a control for the test.  . Quantiferon Nil Value 05/10/2023 0.00  IU/mL Final  . Quantiferon Mitogen Value 05/10/2023 >10.00  IU/mL Final  . Quantiferon TB1 Ag Value 05/10/2023 0.03  IU/mL Final  . Quantiferon TB2 Ag Value 05/10/2023 0.00  IU/mL Final  . Opiate Confirmation 05/10/2023 Positive   Final  . Codeine 05/10/2023 Negative  ng/mL Final  . Morphine  05/10/2023 Negative  ng/mL Final  . 6-Acetylmorphine 05/10/2023 Negative  Final  . Hydromorphone  05/10/2023  Negative  ng/mL Final  . Dihydrocodeine 05/10/2023 Negative  ng/mL Final   Expected metabolism of opiate class drugs:   Parent Drug       Detected Metabolites  -----------       --------------------  Codeine:          Major:  Morphine                     Minor:  Hydrocodone , Hydromorphone ,                            Dihydrocodeine  Morphine :         Minor:  Hydromorphone   Hydrocodone :      Hydromorphone , Dihydrocodeine  Hydromorphone :    None  Dihydrocodeine:   None  Heroin:           6-Acetylmorphine                    Morphine                     Codeine, in small amounts in comparison                     to morphine , is often detected when                     heroin is the source drug. Confirmation threshold: 1.0 ng/mL  . Hydrocodone  05/10/2023 1.2  ng/mL Final  . Oxycodones Confirmation 05/10/2023 Negative   Final  . Oxycodone  05/10/2023 Negative  ng/mL Final  . Oxymorphone 05/10/2023 Negative  ng/mL Final   Confirmation threshold: 1.0 ng/mL  . Benzodiazepines Confirmation 05/10/2023 Positive   Final  . Diazepam 05/10/2023 12  ng/mL Final  . Desmethyldiazepam 05/10/2023 15  ng/mL Final  . Oxazepam 05/10/2023 Negative  ng/mL Final  . Temazepam 05/10/2023 Negative  ng/mL Final  . Chlordiazepoxide 05/10/2023 Negative   Final  . Desmethylchlordiazepoxide 05/10/2023 Negative   Final   Expected metabolism of benzodiazepine class drugs:   Parent Drug       Detected Metabolites  -----------       --------------------  Diazepam:         Desmethyldiazepam, Temazepam, Oxazepam  Chlordiazepoxide: Desmethyldiazepam, Oxazepam  Clorazepate:      Desmethyldiazepam, Oxazepam  Halazepam:        Desmethyldiazepam, Oxazepam  Temazepam:        Oxazepam  Oxazepam:         None  . Alprazolam 05/10/2023 Negative  ng/mL Final  . Triazolam 05/10/2023 Negative  ng/mL Final  . Lorazepam 05/10/2023 Negative  ng/mL Final  . Flurazepam 05/10/2023 Negative  ng/mL Final  . Desalkylflurazepam 05/10/2023  Negative  ng/mL Final  . Midazolam  05/10/2023 Negative  ng/mL Final  . Clonazepam 05/10/2023 Negative  ng/mL Final  . 7-Aminoclonazepam 05/10/2023 Negative  ng/mL Final   Confirmation thresholds: 2  ng/mL: alprazolam, triazolam, midazolam  and clonazepam 10 ng/mL: diazepam, desmethyldiazepam, oxazepam, temazepam,           chlordiazepoxide, desmethylchlordiazepoxide,           lorazepam, flurazepam, desalkylflurazepam,           and 7-aminoclonazepam.  . Cannabinoid Confirmation 05/10/2023 Positive   Final  . Tetrahydrocannabinol (THC) 05/10/2023 Negative  ng/mL Final  . Carboxy-THC 05/10/2023 2.1  ng/mL Final  . Hydroxy-THC 05/10/2023 Negative  ng/mL Final  . Cannabidiol 05/10/2023 Negative  ng/mL Final   Confirmation threshold: 1.0 ng/mL

## 2023-10-06 ENCOUNTER — Ambulatory Visit (HOSPITAL_COMMUNITY)
Admission: RE | Admit: 2023-10-06 | Discharge: 2023-10-06 | Disposition: A | Discharge status: Home / Self Care | Payer: Commercial Managed Care - HMO | Source: Ambulatory Visit | Attending: Nephrology | Admitting: Nephrology

## 2023-10-06 ENCOUNTER — Other Ambulatory Visit: Payer: Self-pay

## 2023-10-06 ENCOUNTER — Encounter (HOSPITAL_COMMUNITY): Payer: Self-pay | Admitting: Nephrology

## 2023-10-06 ENCOUNTER — Encounter (HOSPITAL_COMMUNITY)
Admission: RE | Disposition: A | Discharge status: Home / Self Care | Payer: Self-pay | Source: Ambulatory Visit | Attending: Nephrology

## 2023-10-06 DIAGNOSIS — Z794 Long term (current) use of insulin: Secondary | ICD-10-CM | POA: Diagnosis not present

## 2023-10-06 DIAGNOSIS — D631 Anemia in chronic kidney disease: Secondary | ICD-10-CM | POA: Diagnosis not present

## 2023-10-06 DIAGNOSIS — Z87891 Personal history of nicotine dependence: Secondary | ICD-10-CM | POA: Insufficient documentation

## 2023-10-06 DIAGNOSIS — Y832 Surgical operation with anastomosis, bypass or graft as the cause of abnormal reaction of the patient, or of later complication, without mention of misadventure at the time of the procedure: Secondary | ICD-10-CM | POA: Insufficient documentation

## 2023-10-06 DIAGNOSIS — N186 End stage renal disease: Secondary | ICD-10-CM | POA: Insufficient documentation

## 2023-10-06 DIAGNOSIS — E1022 Type 1 diabetes mellitus with diabetic chronic kidney disease: Secondary | ICD-10-CM | POA: Insufficient documentation

## 2023-10-06 DIAGNOSIS — I871 Compression of vein: Secondary | ICD-10-CM | POA: Diagnosis not present

## 2023-10-06 DIAGNOSIS — T82858A Stenosis of vascular prosthetic devices, implants and grafts, initial encounter: Secondary | ICD-10-CM | POA: Diagnosis present

## 2023-10-06 DIAGNOSIS — Z95828 Presence of other vascular implants and grafts: Secondary | ICD-10-CM | POA: Diagnosis not present

## 2023-10-06 DIAGNOSIS — N250 Renal osteodystrophy: Secondary | ICD-10-CM | POA: Diagnosis not present

## 2023-10-06 DIAGNOSIS — Z992 Dependence on renal dialysis: Secondary | ICD-10-CM | POA: Insufficient documentation

## 2023-10-06 DIAGNOSIS — I120 Hypertensive chronic kidney disease with stage 5 chronic kidney disease or end stage renal disease: Secondary | ICD-10-CM | POA: Insufficient documentation

## 2023-10-06 HISTORY — PX: PERIPHERAL VASCULAR BALLOON ANGIOPLASTY: CATH118281

## 2023-10-06 HISTORY — PX: A/V FISTULAGRAM: CATH118298

## 2023-10-06 SURGERY — A/V FISTULAGRAM
Anesthesia: LOCAL

## 2023-10-06 MED ORDER — FENTANYL CITRATE (PF) 100 MCG/2ML IJ SOLN
INTRAMUSCULAR | Status: AC
  Filled 2023-10-06: qty 2

## 2023-10-06 MED ORDER — LIDOCAINE HCL (PF) 1 % IJ SOLN
INTRAMUSCULAR | Status: DC | PRN
  Administered 2023-10-06: 2 mL via INTRADERMAL

## 2023-10-06 MED ORDER — MIDAZOLAM HCL 2 MG/2ML IJ SOLN
INTRAMUSCULAR | Status: AC
  Filled 2023-10-06: qty 2

## 2023-10-06 MED ORDER — IODIXANOL 320 MG/ML IV SOLN
INTRAVENOUS | Status: DC | PRN
  Administered 2023-10-06: 24 mL via INTRAVENOUS

## 2023-10-06 MED ORDER — FENTANYL CITRATE (PF) 100 MCG/2ML IJ SOLN
INTRAMUSCULAR | Status: DC | PRN
  Administered 2023-10-06: 25 ug via INTRAVENOUS

## 2023-10-06 MED ORDER — LIDOCAINE HCL (PF) 1 % IJ SOLN
INTRAMUSCULAR | Status: AC
  Filled 2023-10-06: qty 30

## 2023-10-06 MED ORDER — HEPARIN (PORCINE) IN NACL 1000-0.9 UT/500ML-% IV SOLN
INTRAVENOUS | Status: DC | PRN
  Administered 2023-10-06: 500 mL

## 2023-10-06 MED ORDER — MIDAZOLAM HCL 2 MG/2ML IJ SOLN
INTRAMUSCULAR | Status: DC | PRN
  Administered 2023-10-06: 1 mg via INTRAVENOUS

## 2023-10-06 SURGICAL SUPPLY — 11 items
BAG SNAP BAND KOVER 36X36 (MISCELLANEOUS) ×2 IMPLANT
BALLN ATHLETIS 8X40X75 (BALLOONS) ×2
BALLOON ATHLETIS 8X40X75 (BALLOONS) IMPLANT
CATH ANGIO 5F BER2 65CM (CATHETERS) IMPLANT
COVER DOME SNAP 22 D (MISCELLANEOUS) ×2 IMPLANT
GUIDEWIRE ANGLED .035 180CM (WIRE) IMPLANT
SHEATH PINNACLE R/O II 6F 4CM (SHEATH) IMPLANT
SYR MEDALLION 10ML (SYRINGE) IMPLANT
SYR MEDALLION 3ML (SYRINGE) IMPLANT
TRAY PV CATH (CUSTOM PROCEDURE TRAY) ×2 IMPLANT
WIRE BENTSON .035X145CM (WIRE) IMPLANT

## 2023-10-06 NOTE — H&P (Addendum)
Chief Complaint: Decreased flows  Interval H&P  The patient has presented today for an angiogram/ angioplasty.  Various methods of treatment have been discussed with the patient.  After consideration of risk, benefits and other options for treatment, the patient has consented to a angiogram/ angioplasty with  possible stent placement.   Risks of angiogram with potential angioplasty and stenting if needed.contrast reaction, extravasation/ bleeding, dissection, hypotension and death were explained to the patient.  The patient's history has been reviewed and the patient has been examined, no changes in status.  Stable for angiogram/angioplasty  I have reviewed the patient's chart and labs.  Questions were answered to the patient's satisfaction.  Assessment/Plan: ESRD dial[facility]zing at [facility] regimen with last dialysis [place] Decreased access flows in left brachiocephalic fistula placed October 25, 2022 by Dr. Randie Heinz- planning on angiogram with possibly angioplasty.  Peritoneal dialysis, catheter has already been removed. Renal osteodystrophy - continue binders per home regimen. Anemia - managed with ESA's and IV iron at dialysis center. HTN - resume home regimen.   HPI: John [name] is an 33 y.o. male with a history of diabetes type 1, hypertension, postoperative nausea and vomiting, asthma, end-stage renal disease dialyzing Tuesday Thursdays and Saturdays at [facility].  ROS Per HPI.  Chemistry and CBC: Creatinine, Ser  Date/Time Value Ref Range Status  05/12/2023 02:25 PM 7.30 (H) 0.61 - 1.24 mg/dL Final  16/05/9603 54:09 AM 7.10 (H) 0.61 - 1.24 mg/dL Final  81/19/1478 29:56 AM 5.00 (H) 0.61 - 1.24 mg/dL Final  21/30/8657 84:69 AM 5.72 (H) 0.61 - 1.24 mg/dL Final  62/95/2841 32:44 AM 7.78 (H) 0.61 - 1.24 mg/dL Final  08/24/7251 66:44 AM 7.38 (H) 0.61 - 1.24 mg/dL Final  03/47/4259 56:38 AM 8.69 (H) 0.61 - 1.24 mg/dL Final  75/64/3329 51:88 AM 7.76 (H) 0.61 - 1.24 mg/dL Final  41/66/0630  16:01 AM 7.29 (H) 0.61 - 1.24 mg/dL Final  09/32/3557 32:20 AM 7.01 (H) 0.61 - 1.24 mg/dL Final  25/42/7062 37:62 AM 6.73 (H) 0.61 - 1.24 mg/dL Final  83/15/1761 60:73 AM 6.56 (H) 0.61 - 1.24 mg/dL Final  71/01/2693 85:46 PM 6.69 (H) 0.61 - 1.24 mg/dL Final  27/10/5007 38:18 AM 1.31 (H) 0.61 - 1.24 mg/dL Final  29/93/7169 67:89 PM 0.92 0.61 - 1.24 mg/dL Final  38/05/1750 02:58 AM 1.17 0.61 - 1.24 mg/dL Final  52/77/8242 35:36 AM 0.75 0.61 - 1.24 mg/dL Final  14/43/1540 08:67 PM 0.81 0.61 - 1.24 mg/dL Final  61/95/0932 67:12 PM 0.79 0.61 - 1.24 mg/dL Final  45/80/9983 38:25 AM 0.86 0.61 - 1.24 mg/dL Final  05/39/7673 41:93 PM 0.98 0.61 - 1.24 mg/dL Final  79/09/4095 35:32 PM 0.63 0.61 - 1.24 mg/dL Final  99/24/2683 41:96 AM 0.99 0.61 - 1.24 mg/dL Final  22/29/7989 21:19 AM 0.72 0.50 - 1.35 mg/dL Final  41/74/0814 48:18 AM 0.82 0.50 - 1.35 mg/dL Final  56/31/4970 26:37 AM 0.82 0.50 - 1.35 mg/dL Final  85/88/5027 74:12 PM 0.81 0.50 - 1.35 mg/dL Final  87/86/7672 09:47 PM 0.90 0.50 - 1.35 mg/dL Final  09/62/8366 29:47 PM 0.80 0.50 - 1.35 mg/dL Final  65/46/5035 46:56 AM 0.60 0.50 - 1.35 mg/dL Final  81/27/5170 01:74 AM 0.60 0.50 - 1.35 mg/dL Final  94/49/6759 16:38 AM 0.80 0.50 - 1.35 mg/dL Final  46/65/9935 70:17 AM 0.63 0.50 - 1.35 mg/dL Final  79/39/0300 92:33 AM 0.69 0.50 - 1.35 mg/dL Final  00/76/2263 33:54 AM 0.65 0.50 - 1.35 mg/dL Final  56/25/6389 37:34 PM 0.70 0.50 -  1.35 mg/dL Final  42/35/3614 43:15 PM 0.75 0.50 - 1.35 mg/dL Final  40/03/6760 95:09 PM 0.60 0.50 - 1.35 mg/dL Final  32/67/1245 80:99 AM 0.65 0.50 - 1.35 mg/dL Final  83/38/2505 39:76 AM 0.69 0.50 - 1.35 mg/dL Final  73/41/9379 02:40 AM 0.79 0.50 - 1.35 mg/dL Final  97/35/3299 24:26 AM 0.82 0.50 - 1.35 mg/dL Final  83/41/9622 29:79 AM 0.81 0.50 - 1.35 mg/dL Final  89/21/1941 74:08 PM 0.96 0.50 - 1.35 mg/dL Final  14/48/1856 31:49 AM 0.75 0.4 - 1.5 mg/dL Final  70/26/3785 88:50 PM 0.81 0.4 - 1.5 mg/dL Final   27/74/1287 86:76 PM 0.82 0.4 - 1.5 mg/dL Final  72/04/4708 62:83 PM 0.8 0.4 - 1.5 mg/dL Final  66/29/4765 46:50 PM 0.98  Final   No results for input(s): "NA", "K", "CL", "CO2", "GLUCOSE", "BUN", "CREATININE", "CALCIUM", "PHOS" in the last 168 hours.  Invalid input(s): "ALB" No results for input(s): "WBC", "NEUTROABS", "HGB", "HCT", "MCV", "PLT" in the last 168 hours. Liver Function Tests: No results for input(s): "AST", "ALT", "ALKPHOS", "BILITOT", "PROT", "ALBUMIN" in the last 168 hours. No results for input(s): "LIPASE", "AMYLASE" in the last 168 hours. No results for input(s): "AMMONIA" in the last 168 hours. Cardiac Enzymes: No results for input(s): "CKTOTAL", "CKMB", "CKMBINDEX", "TROPONINI" in the last 168 hours. Iron Studies: No results for input(s): "IRON", "TIBC", "TRANSFERRIN", "FERRITIN" in the last 72 hours. PT/INR: @LABRCNTIP (inr:5)  Xrays/Other Studies: )No results found for this or any previous visit (from the past 48 hours). No results found.  PMH:   Past Medical History:  Diagnosis Date   ADD (attention deficit disorder)    Allergic rhinitis    Anemia    hx ckd   Asthma    patient denies this dx, no inhalers   Chronic kidney disease    ESRD on dialysis [facility]   Complication of anesthesia    N/V   Diabetic ketoacidosis without coma associated with type 1 diabetes mellitus (HCC)    DKA (diabetic ketoacidoses)    Family history of adverse reaction to anesthesia    mother - nausea   Goiter, unspecified 02/04/2011   Headache    with elevate blood pressure   Hypertension    IDA (iron deficiency anemia)    Pneumonia    Possiblly 10/25/22-  he was told, from chest X-ray   PONV (postoperative nausea and vomiting)    Type I (juvenile type) diabetes mellitus without mention of complication, uncontrolled 02/04/2011   On Insulin    PSH:   Past Surgical History:  Procedure Laterality Date   AV FISTULA PLACEMENT Left 10/25/2022   Procedure: CEPHALIC  ARTERIOVENOUS (AV) FISTULA CREATION;  Surgeon: Maeola Harman, MD;  [facility];  Service: Vascular;  Laterality: Left;   CAPD INSERTION N/A 01/17/2023   Procedure: LAPAROSCOPIC INSERTION CONTINUOUS AMBULATORY PERITONEAL DIALYSIS  (CAPD) CATHETER;  Surgeon: Maeola Harman, MD;  Location: [facility] OR;  Service: Vascular;  Laterality: N/A;   CAPD REMOVAL N/A 05/12/2023   Procedure: REMOVAL CONTINUOUS AMBULATORY PERITONEAL DIALYSIS  (CAPD) CATHETER;  Surgeon: Maeola Harman, MD;  Location: [facility] OR;  Service: Vascular;  Laterality: N/A;   EYE SURGERY Right 2024   INSERTION OF DIALYSIS CATHETER Right 10/25/2022   Procedure: INSERTION OF TUNNELED PALINDROME 14.5 FR X 19 CM DIALYSIS CATHETER;  Surgeon: Maeola Harman, MD;  Location: [facility] OR;  Service: Vascular;  Laterality: Right;   TONSILLECTOMY      Allergies: No Known Allergies  Medications:   Prior to  Admission medications   Medication Sig Start Date End Date Taking? Authorizing Provider  acetaminophen (TYLENOL) 500 MG tablet Take 1,000 mg by mouth every 6 (six) hours as needed for mild pain or moderate pain.   Yes [provider]  amLODipine (NORVASC) 10 MG tablet Take 1 tablet (10 mg total) by mouth daily. 10/29/22  Yes Elgergawy, Leana Roe, MD  bismuth subsalicylate (PEPTO BISMOL) 262 MG chewable tablet Chew 524 mg by mouth daily as needed for indigestion or diarrhea or loose stools.   Yes [provider]  Calcium Carbonate Antacid (TUMS PO) Take 1 tablet by mouth 2 (two) times daily as needed (heartburn).   Yes [provider]  Glucagon (BAQSIMI ONE PACK) 3 MG/DOSE POWD Place 1 spray into the nose as needed (hypoglycemic episode).   Yes [provider]  Insulin Glargine (BASAGLAR KWIKPEN) 100 UNIT/ML Inject 18 Units into the skin at bedtime. 09/06/23  Yes [provider]  labetalol (NORMODYNE) 100 MG tablet Take 1 tablet (100 mg total) by mouth 2 (two) times daily.  10/29/22  Yes Elgergawy, Leana Roe, MD  Melatonin 10 MG CHEW Chew 10-20 mg by mouth at bedtime as needed (Sleep).   Yes [provider]  Multiple Vitamins-Minerals (PRESERVISION AREDS 2 PO) Take 1 tablet by mouth in the morning.   Yes [provider]  NOVOLOG FLEXPEN 100 UNIT/ML FlexPen Inject 3-6 Units into the skin 3 (three) times daily with meals. 07/27/23  Yes [provider]  sevelamer carbonate (RENVELA) 800 MG tablet Take 2,400 mg by mouth See admin instructions. Take 3 tablets (2400 mg) by mouth with each meal & with each snack   Yes [provider]  simethicone (MYLICON) 80 MG chewable tablet Chew 80 mg by mouth every 6 (six) hours as needed for flatulence.   Yes [provider]  glucose blood (ONE TOUCH ULTRA TEST) test strip Use as instructed 3x daily 05/04/17   Dolores Patty C, DO  Insulin Pen Needle 31G X 8 MM MISC 1 each by Does not apply route 4 (four) times daily -  with meals and at bedtime. 03/24/15   Hoy Register, MD    Discontinued Meds:   Medications Discontinued During This Encounter  Medication Reason   insulin glargine (LANTUS) 100 UNIT/ML injection Change in therapy   acetaminophen (TYLENOL) 325 MG tablet Dose change   insulin aspart (NOVOLOG) 100 UNIT/ML injection Duplicate   RENVELA 800 MG tablet Duplicate   traMADol (ULTRAM) 50 MG tablet No longer needed (for PRN medications)    Social History:  reports that he quit smoking about 9 months ago. His smoking use included cigarettes. He quit smokeless tobacco use about 10 years ago. He reports that he does not currently use alcohol. He reports current drug use. Frequency: 7.00 times per week. Drug: Marijuana.  Family History:   Family History  Problem Relation Age of Onset   Lupus Cousin    Cancer Neg Hx     There were no vitals taken for this visit. GEN: NAD, blind HEENT: No conjunctival pallor, EOMI NECK: Supple, no thyromegaly LUNGS: CTA B/L no rales, rhonchi or  wheezing CV: RRR, No M/R/G ABD: SNDNT +BS  EXT: No lower extremity edema ACCESS: lt BCF hyperpulsatile, tortuous       Ethelene Hal, MD 10/06/2023, 8:52 AM

## 2023-10-06 NOTE — Op Note (Signed)
Patient presents with decreased access flows of his left BCF (placed 10/25/22 by Dr. Randie Heinz).  This is first procedure.   On examination, the brachial cephalic fistula is hyperpulsatile and tortuous.  Summary:  1)      The patient had successful angioplasty (8mm Athletis FE ~24 atm) of significant stenosis in the cephalic vein arch not involving the confluence.  In the future I would possibly use a 9 mm preferably conquest if available plus or minus stent graft. 2)      The body of the cephalic vein fistula was patent with good flows. 3)      The centrals and inflow were widely patent. 4)      This left BCF remains amenable to future percutaneous intervention.  Description of procedure: The arm was prepped and draped in the usual sterile fashion. The left upper arm brachial cephalic fistula was cannulated (40981) with an 18G Angiocath needle directed in an antegrade direction. A guidewire was inserted and exchanged for a 6 Fr sheath. Contrast [phone] injection via the side port of the sheath was performed. The angiogram of the fistula (82956) showed a patent body of the left BCF, tortuous outflow cephalic vein and an 70% cephalic vein arch stenosis not involving the confluence. The inflow anastomosis and left centrals were patent.  The wire was advanced centrally without any difficulty. An 8 mm Athletis angioplasty balloon was inserted over a glide wire and positioned at the cephalic vein arch stenosis. Venous angioplasty (21308) was carried out to 24 ATM with FULL effacement of the waist on the balloon at the arch lesion.  Repeat angiogram showed <30% residual at the site of angioplasty with no evidence of extravasation.  The flow of contrast was quicker and the fistula was markedly less pulsatile.  Hemostasis: A 3-0 ethilon purse string suture was placed at the cannulation site on removal of the sheath.  Sedation: 1 mg Versed, 25 mcg Fentanyl. Sedation time. 17 minutes  Contrast. 24  mL  Monitoring: Because of the patient's comorbid conditions and sedation during the procedure, continuous EKG monitoring and O2 saturation monitoring was performed throughout the procedure by the RN. There were no abnormal arrhythmias encountered.  Complications: None.   Diagnoses: I87.1 Stricture of vein  N18.6 ESRD T82.858A Stricture of access  Procedure Coding:  [phone] Cannulation and angiogram of fistula, venous angioplasty (cephalic vein arch) O9629 Contrast  Recommendations:  1. Continue to cannulate the fistula with 15G needles.  2. Refer back for problems with flows. 3. Remove the suture next treatment.   Discharge: The patient was discharged home in stable condition. The patient was given education regarding the care of the dialysis access AVF and specific instructions in case of any problems.

## 2023-10-06 NOTE — Discharge Instructions (Signed)

## 2024-03-15 NOTE — H&P (View-Only) (Signed)
 PASS-Perioperative Anesthesia & Surgical Screening APP PHONE SCREEN Travel screening was performed on this patient at the time of their PASS evaluation. Phone Screen interview ALERTS:    - PONV; Hypoxemia with surgery 05/2023 - L AV fistula - History taking assisted by Life Partner, John [name] who will be present on DOS Procedure:    Date/Time: 11/17/23   Procedure:  REPAIR OF COMPLEX RETINAL DETACHMENT; 25G, WITH VITRECTOMY AND MEMBRANE PEELING, TAMPONADE, CRYOTHERAPY, ENDOLASER PHOTOCOAGULATION, DRAINAGE SUBRETINAL FLUID, SCLERAL BUCKLING, REMOVAL OF LENS - Right AVASTIN----- INTRAVITREAL INJECTION OF A PHARMACOLOGIC AGENT (SEPARATE PROCEDURE) - Right EXTRACAPSULAR CATARACT ECCE REMOVAL WITH INSERTION OF INTRAOCULAR LENS PROSTHESIS, COMPLEX, REQUIRING DEVICES/TECHNIQUES NOT USED IN ROUTINE CATARACT SURGERY; WITHOUT ENDOSCOPIC CYCLOPHOTOCOAGULATION - Right     [facility] Russ and Dr. Bethany      CC/HPI  John [name] is a 33 y.o. male is being contacted by Pre-Anesthesia and Surgical Screening (PASS) clinic for pre-operative evaluation prior to above procedure with Dr. Vajzovic and Dr. Bethany.  Patient with retinal detachment and worsening vision loss scheduled for surgical repair.  Pmhx remarkable for HTN, ESRD on HD (M/W/F), anemia, and T1DM.  Airway note on 06/02/23: Date/Time: 06/02/23 9070  Difficult Airway: No  Final Airway Type: supraglottic airway  Cuffed: Yes  Final Supraglottic Airway: classic style  SGA Size: 5  Number of Attempts at Approach: 1  Number of Other Approaches Attempted: 0   Vitals:  Wt Readings from Last 3 Encounters:  08/18/23 84.7 kg (186 lb 11.7 oz)  06/02/23 83.4 kg (183 lb 13.8 oz)  05/25/23 87.2 kg (192 lb 3.2 oz)   Temp Readings from Last 3 Encounters:  08/18/23 36.6 C (97.9 F) (Oral)  06/02/23 37 C (98.6 F) (Temporal)  01/02/23 36.9 C (98.4 F) (Temporal)   BP Readings from Last 3 Encounters:  08/18/23  (!) 170/110  06/02/23 (!) 147/94  01/02/23 (!) 162/112   Pulse Readings from Last 3 Encounters:  08/18/23 103  06/02/23 95  01/02/23 102   Relevant Risk Scores: STOP BANG: 1 [facility] Activity Status Index (DASI): 21.45 Medical History                                    HEENT Severe diabetic retinopathy and retinal detachment both eyes   Upper partial denture  Pulmonary .  URI in last 4 weeks (Symptoms started on 10/31/23 -- mild cold sx/congestion. No cough or fever. Slowly improving. Managed on OTC medication.) -Denies history of lung disease or history of PE  -Prefers to sit up  Cardiovascular  .  Exercise tolerance: >4 METS (Limited by vision loss, but per partner prior to the vision loss was active, able to walk 1-2 blocks, climb 1 FOS, and perform light to moderate ADLs without CP or dyspnea) .  HTN (Per partner -- good BPs at home 140s/70s): John [name]  EKG ((CE) 04/2023 Sinus rhythm  Possible Left atrial enlargement  Otherwise normal) - Per partner no cardiac symptoms. No CP, SOB, palpitations, presyncope, syncope or PND.   Stress test on 09/20/23: 1.) LIMITATIONS: None.  2.) MYOCARDIAL PERFUSION: No evidence of ischemia or infarct  3.) LEFT VENTRICULAR EJECTION FRACTION: Reduced, 41% during rest and stress  4.) REGIONAL WALL MOTION: Mild global hypokinesia.   (CE) 08/02/2023 Carotid US  Impression: RIGHT  The internal carotid artery appears within normal limits by duplex imaging. The vertebral artery flow is antegrade.  LEFT  The internal carotid artery appears within normal limits by duplex imaging. The vertebral artery flow is antegrade.   Gastrointestinal .  GERD:, well controlled  GU/Renal .  Dialysis: Hemodialysis:Mon-Wed-Fri   Neuro/Spine/CNS .  Negative for seizure disorder No history of CVA/TIA per partner  Hematology/Oncology  .  Anemia: chronic, renal failure  Rheumatology Normal as reviewed Endocrine .  Diabetes (managed on insulin ), DM Type 1:    Musculoskeletal Normal as reviewed.  Skin  Normal as reviewed. Infectious Disease Normal as reviewed. Psych . Anxiety (situational)    Review of Systems   A comprehensive 10-system ROS was asked and was negative except for the following:  HEENT: no hearing impairment, no contacts/glassesPulm: no coughCardiac: no chest pressure, no cardiac dyspnea, no PND, no syncopeGI: diarrhea (intermittent) GI: .no blood in stool, no nausea/vomiting, no constipationGU/Renal: .no difficulty urinating, no hematuria  Past Surgical History   Past Surgical History:  Procedure Laterality Date  . EXTRACTION CATARACT EXTRACAPSULAR W/INSERTION INTRAOCULAR PROSTHESIS Right 11/17/2023   Procedure: EXTRACAPSULAR CATARACT ECCE REMOVAL WITH INSERTION OF INTRAOCULAR LENS PROSTHESIS, COMPLEX, REQUIRING DEVICES/TECHNIQUES NOT USED IN ROUTINE CATARACT SURGERY; WITHOUT ENDOSCOPIC CYCLOPHOTOCOAGULATION;  Surgeon: Bethany Pleas, MD;  [facility];  Service: Ophthalmology;  Laterality: Right;  . INJECTION INTRAVITREAL PHARMACOLOGIC AGENT Right 11/17/2023   Procedure: AVASTIN----- INTRAVITREAL INJECTION OF A PHARMACOLOGIC AGENT (SEPARATE PROCEDURE);  Surgeon: Russ Pinna, MD;  [facility];  Service: Ophthalmology;  Laterality: Right;  . REPAIR COMPLEX RETINAL DETACHMENT Left 06/02/2023   Procedure: REPAIR OF COMPLEX RETINAL DETACHMENT; 25G, WITH VITRECTOMY AND MEMBRANE PEELING, TAMPONADE, CRYOTHERAPY, ENDOLASER PHOTOCOAGULATION, DRAINAGE SUBRETINAL FLUID, SCLERAL BUCKLING, REMOVAL OF LENS;  Surgeon: Russ Pinna, MD;  [facility];  Service: Ophthalmology;  Laterality: Left;  . REPAIR COMPLEX RETINAL DETACHMENT Right 11/17/2023   Procedure: REPAIR OF COMPLEX RETINAL DETACHMENT; 25G, WITH VITRECTOMY AND MEMBRANE PEELING, TAMPONADE, CRYOTHERAPY, ENDOLASER PHOTOCOAGULATION, DRAINAGE SUBRETINAL FLUID, SCLERAL BUCKLING, REMOVAL OF LENS;  Surgeon: Russ Pinna, MD;  [facility];  Service: Ophthalmology;  Laterality: Right;  . REPAIR RETINAL DETACHMENT W/VITRECTOMY Right 01/02/2023   Procedure: REPAIR OF RETINAL DETACHMENT; WITH VITRECTOMY, 25G, MEMBRANE PEEL, WITH/WITHOUT AIR/GAS TAMPONADE, FOCAL ENDOLASER PHOTOCOAGULATION;  Surgeon: Russ Pinna, MD;  [facility];  Service: Ophthalmology;  Laterality: Right;  . TONSILLECTOMY & ADENOIDECTOMY    . tunneled dialysis catheter insertion Right 10/25/2022  . VITRECTOMY MECHANICAL PARS PLANA Left 06/02/2023   Procedure: VITRECTOMY; 25G, MECHANICAL, PARS PLANA APPROACH;  Surgeon: Russ Pinna, MD;  [facility];  Service: Ophthalmology;  Laterality: Left;    Social/Family History   Social History   Occupational History  . Not on file  Tobacco Use  . Smoking status: Former    Types: Cigarettes  . Smokeless tobacco: Never  . Tobacco comments:    Smoking cigarettes since ~2018, not daily.   Vaping Use  . Vaping status: Never Used  Substance and Sexual Activity  . Alcohol use: Not Currently  . Drug use: Not Currently    Frequency: 7.0 times per week    Types: Marijuana    Comment: advised of anesthesia recommendations to hold 7 days prior to procedures.  . Sexual activity: Defer   Vaping/E-Cigarettes  . Vaping/E-Cigarette Use Never User   . Start Date    . Cartridges/Day    . Quit Date     Family History  Problem Relation Age of Onset  . PONV Mother   . Anesthesia problems Mother    Allergies  No Known Allergies Medications   Current Facility-Administered Medications  Medication Dose Route Frequency Last Rate Last Admin  . dextrose  50% in water solution 12.5-25 g  12.5-25 g Intravenous As Directed      . glucagon (GLUCAGEN) injection 1 mg  1 mg Intramuscular As Directed      . sodium chloride  0.9% flush inj syringe 5 mL  5 mL Intravenous As Directed      . lidocaine  (XYLOCAINE ) 1 % injection 0.5-1 mL  0.5-1 mL Subcutaneous Once PRN      . acetaminophen  (TYLENOL )  tablet 975 mg  975 mg Oral Once       Physical Exam  Phys Exam Test Results    Labwork in CE:    Patient Instructions  Medication instructions prior to procedure:    Medication Sig INSTRUCTIONS  . acetaminophen  (TYLENOL ) 325 MG tablet Take 650 mg by mouth every 6 (six) hours as needed TAKE day of procedure, if needed  . amLODIPine  (NORVASC ) 10 MG tablet Take 1 tablet by mouth once daily TAKE day of procedure  . BAQSIMI 3 mg/actuation nasal spray Place into one nostril at bedtime as needed TAKE day of procedure, if needed  . bismuth subsalicylate (PEPTO-BISMOL) 262 mg chewable tablet Take by mouth DO NOT TAKE day of procedure  . calcium  carbonate 300 mg (750 mg) chewable tablet Take 300 mg of elemental by mouth once daily as needed for Heartburn DO NOT TAKE day of procedure  . insulin  GLARGINE (LANTUS  SOLOSTAR) pen injector (concentration 100 units/mL) Inject 18 Units subcutaneously at bedtime TAKE 80% of your normal nightly dose the night prior to the procedure  . labetaloL  (TRANDATE ) 100 MG tablet Take 100 mg by mouth 2 (two) times daily Do not take the mornings of dialysis on MON/WED/FRI TAKE day of procedure  . melatonin 10 mg Chew Take 10 mg by mouth at bedtime as needed TAKE night before procedure if needed.  . NOVOLOG  FLEXPEN U-100 INSULIN  pen injector (concentration 100 units/mL) Inject 5-10 Units subcutaneously 3 (three) times daily with meals As directed per sliding scale DO NOT TAKE day of procedure  . sevelamer carbonate (RENVELA) 800 mg tablet Take 2,400 mg by mouth 3 (three) times daily with meals And snacks DO NOT TAKE day of procedure  . simethicone  (MYLICON) 80 MG chewable tablet Take 80 mg by mouth as needed DO NOT TAKE day of procedure   Prior to your procedure:   Do not wear any jewelry, make-up, nail polish, powders, lotions or deodorant day of surgery. Please remove your dentures (if applicable), all piercings and contact lenses. DO NOT bring valuable items (jewelry  or money) to the hospital.  The hospital is not responsible for lost, stolen or damaged items. We recommend brushing your teeth thoroughly the morning of procedure. BRING your picture ID and insurance card the day of surgery. Bring your CPAP if you have sleep apnea and use one. Please avoid alcohol and tobacco use prior to your procedure. Please do not bring food or drinks to the waiting area or while in preop. Please avoid use of your cell phone while in the preop area.   NO herbal supplements/multivitamins for 7 days before procedure.    Please avoid taking NSAIDs (such as ibuprofen , Advil , Motrin , Naproxen ) one week prior to procedure.    Tylenol  is okay to take as needed for pain up until the morning of your procedure; do not exceed > 3 grams in 24 hours.   Please use an  antibacterial/antimicrobial soap, such as Dial, to shower the night before procedure and the day of procedure.  No ointments/creams/lotions/deodorant on the day of procedure.    NOTHING TO EAT AFTER MIDNIGHT THE NIGHT BEFORE THE PROCEDURE.   YOU CAN HAVE WATER UP UNTIL 3 HOURS PRIOR TO THE PROCEDURE. Problem List   Patient Active Problem List  Diagnosis  . Essential hypertension, benign  . Tobacco use  . Uncontrolled type 1 diabetes mellitus with hyperglycemia, with long-term current use of insulin  (CMS/HHS-HCC)  . ESRD on hemodialysis (CMS/HHS-HCC)  . Proliferative diabetic retinopathy associated with type 1 diabetes mellitus (CMS/HHS-HCC)  . Anemia due to chronic kidney disease, on chronic dialysis (CMS/HHS-HCC)  . Very severe proliferative diabetic retinopathy (CMS/HHS-HCC)  . Preoperative evaluation to rule out surgical contraindication   Assessment & Plan  ASA: 3 Anesthesia options discussed: General  Anesthesia considerations: - PONV; Hypoxemia with surgery 05/2023 - L AV fistula  Perioperative medical risk as follows: Cardiovascular risk #HTN - Per partner, patient is symptomatically stable from  a cardiovascular standpoint. - No cardiac symptoms. Stress test on 09/20/23 negative for ischemia or infarct. - Exercise tolerance is limited by vision loss, but per partner prior to the vision loss was active, able to walk 1-2 blocks, climb 1 FOS, and perform light to moderate ADLs without CP or dyspnea. - Functional Status (DASI: 21.45). Fair overall functional status  Pulmonary risk #URI - Symptoms started on 10/31/23 -- mild cold sx/congestion. No cough or fever. Slowly improving. Managed on OTC medication.  Anticoagulation/Antiplatelet Not on anticoagulation/antiplatelet therapy  Renal #ESRD on HD (M/W/F) - Per partner, plan to discuss with dialysis clinic about switching dialysis to the day prior and day after surgery for best optimization practice. Currently on transplant list.  Endocrine #T1DM - well controlled on insulin . HGB A1c 6.4 on 09/06/23.  Heme/Onc #Chronic anemia - HGB 10.3 and HCT 30.7 on 05/10/23  ENT Patient with retinal detachment scheduled for surgical repair.  Appropriateness of surgery location [facility] -- Appropriate  POET referrals  None needed  Overall Assessment  This patient has been evaluated for surgical and anesthesia risk prior to this procedure. Medical records, medications, prior labs and pertinent testing have been reviewed.  The procedural risk is average risk for surgical or anesthesia complications based on exam and medical complexity.   Instructions for day of procedure, importance of adhering to NPO guidelines, and medication instructions discussed and written instruction given to patient. The patient verbalized understanding. The patient was given the opportunity to ask questions and have no outstanding questions or concerns.   No further medical assessment or optimization required to proceed with procedure.   This telephone encounter was conducted with the patient's (or proxy's) verbal consent via secure, interactive audio  telecommunications while in clinic/office/hospital.  The patient (or proxy) was instructed to have this encounter in a suitably private space and to only have persons present to whom they give permission to participate. In addition, the patient identity was confirmed by use of name plus an additional identifier.  I spent 30 minutes with patient and/or family on this phone visit today.  Eva Ozell Waddell Watt [facility] Office: [phone] [place] Office: [phone] 03/15/2024 Day of Surgery  Airway: Mallampati: II - soft palate, uvula, fauces visible. TM distance: >3 FB. Mouth opening: Normal, >2 FB. Neck ROM: Full. Dental: Normal. Simple airway Cardiovasc: Rhythm: Regular. Rate: Normal.   Pulmonary: Normal  Plan ASA: 3 Plan: general.  Induction: Intravenous.  Maintenance: Inhalational.  Patient identification and NPO status confirmed, chart reviewed (medical history, including anesthesia, drug, and allergy history), and patient examined. Anesthesia care plan, including plan for postoperative pain control, analgesia regimen, and postoperative disposition discussed with patient and/or parent/legal guardian prior to start of anesthesia care. Patient and/or parent/legal guardian understand that there are anesthetic risks associated with this procedure and wishes to proceed.John [name]

## 2024-03-15 NOTE — Interval H&P Note (Signed)
 The H&P has been reviewed and the patient has been examined. There is no change in the overall  assessment and no contraindication for surgery.

## 2024-03-15 NOTE — Discharge Summary (Signed)
   Hypoglycemia Event Note   Low BG reading: 51 mg/dl Time: 9095   Symptoms: diaphoresis and headache   Action taken/Treatment: D50W IV 12.5 g   Follow up BG reading: 93 mg/dl Time: 9073   Patient Response: Glucose returned to over 80.  Actions taken to prevent repeat hypoglycemia: Provider notified, will continue to monitor   Notification: Name of provider notified of low BG reading, treatment and patient response: yes   Possible factors contributing to hypoglycemia: Pt NPO for surgery.  Recent Labs    03/15/24 0904 03/15/24 0926  POCGLU 51* 93     CHARLI HALL, RN

## 2024-03-15 NOTE — Procedures (Signed)
 DATE OF SURGERY:  March 15, 2024  PREOPERATIVE DIAGNOSES: Visually significant nuclear sclerotic cataract, left eye.  POSTOPERATIVE DIAGNOSES: Same  PROCEDURE: Phacoemulsification with intraocular lens implant, left eye.   ATTENDING SURGEON: Patsi Him, M.D.  ASSISTANT: Dr. Antonia Dew - Fellow  ANESTHESIA: Topical, MAC  COMPLICATIONS: None.  Estimated blood loss:  None   SPECIMENS:  * No orders in the log *   IMPLANTS:  Implant Name Type Inv. Item Serial No. Manufacturer Lot No. LRB No. Used Action  LENS, IOL CLAREON CNA0T0 23.5 D - [id number]  LENS, IOL CLAREON CNA0T0 23.5 D [id number] [employer] N/A Left 1 Implanted     INDICATIONS FOR PROCEDURE: John [name] is a 33 y.o. year-old male that presented to the ophthalmology clinic with  visually significant cataract in the left eye. After risks, benefits, alternatives of surgical intervention in addition to prospective post-operative care required were discussed. The patient's questions were answered.  The patient elected to proceed with cataract removal with IOL in the left eye.  No guarantees regarding the surgical outcome were given.  DESCRIPTION OF PROCEDURE: The patient was correctly identified in the pre-operative area. Therapeutic options were discussed with the patient preoperatively, including a discussion of risks and benefits of surgery.  Informed consent was reviewed.   The patient was premedicated and then transferred to the operative suite and placed in the  supine position.  Topical tetracaine was instilled to the left eye.  After adequate anesthesia, the patient was prepped with betadine and draped in the usual fashion for intraocular surgery.  A wire eyelid speculum was inserted to maintain the palpebral fissure and the surgical microscope was positioned.    The surgical field was turned over to the retina team for them to begin the procedure. Please see their note for further details.  A  Superblade was used to create a paracentesis through clear cornea followed by buffered lidocaine . Poor red reflex was noted. Therefore, Trypan was injected into the operative eye.  Viscoelastic was then administered into the anterior chamber.  A beveled clear corneal incision was created temporal using a 2.2 mm keratome blade.  Continuous, circular capsulorrhexis was then completed using a cystatome needle and Utrata forceps. Cortical cleaving hydrodissection was completed using balanced salt solution on a 27g cannula.  In-situ phacoemulsification of the lens nucleus and epinuclear material  was performed.  Residual cortical material was removed with the irrigation-aspiration handpiece.  Cohesive viscoelastic was instilled to inflate the capsular bag and stabilze the anterior chamber.  The lens implant listed above was injected through the corneal wound and allowed to unfold within the capsular bag. The lens implant was centered. The irrigation-aspiration handpiece  was used to remove all ocular viscosurgical device.  The corneal incision were hydrated and the anterior chamber inflated to a normal depth and tension with balanced salt solution. A single 10-0 nylon was place in an interrupted fashion at the main wound. The corneal wounds were then checked with a Wek-Cel sponge and found to be fluid tight.   The surgical field was then turned over to the retina team to complete their portion. Please see their note for further details.  The patient tolerated the procedure well and without complication and was tranferred to the PACU in stable condition.    Dr. Patsi Him was present for the entire procedure and performed all key and critical portions of the surgery, and the complexity of the surgery necessitated assistance of a fully trained ophthalmologist.  Due to the  complexity of the surgery and the need for the qualified assistant to ensure adequate exposure and excellent outcome, Dr. Antonia Dew  assisted me throughout the surgery  ATTESTATION:  TP- Surgery (With Asst Surgeon) - I personally performed the procedure with the assistance of Dr. Antonia Dew as an assistant surgeon/assistant at surgery as a qualified resident was not available for this procedure (TP).

## 2024-03-15 NOTE — H&P (Signed)
 The H&P has been reviewed and the patient has been examined. There is no change in the overall  assessment and no contraindication for surgery.

## 2024-03-19 NOTE — Progress Notes (Signed)
 John [name] is a 33 y.o. male here for POM #1 Initially referred by ZAMORA, BRIAN GRUEZO  for severe PDR   Past Medical History: Type 1 DM  HTN  Renal failure with recent dialysis on MWF  Past Ocular History:  None  Social History:  previously in [employer] representative, Advertising account planner  1. DM 1,  since 70 yrs of age  Uses insulin  Rec: Blood pressure/blood glucose control, adherence to medications and diet Follow up with Primary care physician/Endocrinologist  A1c was 6.5 recently check when he was in the outside ED and dx w renal failure and needing dialysis  2. PDR OU with VH OU   Needs trd repair OU OD w recent drop in vision Recs for TRD repair OD first  s/p 25gPPV/Kenalog /MP/EL/FAX/IVA/PSTK OD 5.13.24 (Iftikhar/Vajzovic) - Dispersed VH, with continued CF vision.  - Bscan OD: retina attached  S/p 25gPPV/EL/pFAX/IVA OS 06/02/23 Vajzovic/[facility] - Intra-op: dense dehemoglobinized VH, FVP and SRH over nerve noted under VH. Ischemic appearing retina. Also had challenging oxygenation under general anesthesia Overall dense VH in postop and possible nasal trd Recs for PPV combined with ce/iol OS   s/p 25g PPV/IVA/CEIOL OD 11/17/23 (Vajzovic/[facility]) - Doing well, retina attached, IOP good Much better Off of gtts - Positioning: none - Light activity / glasses or shields at all times - RD and endophthalmitis precautions reviewed  03/16/24: Pt noticing VA worse OD (HM now), appears to have slightly more VH on exam. Pt states he was straining a lot to pick up something heavy a few days ago.  IOP normal, retina attached 360 on B-scan. Will observe for now.   Rtc to OR for PPV OS and combo for ce/iol OS  Will reach out to Dr. Bethany to see if he can help with combo OS like he did for od  POW1 s/p 25g PPV/CEIOL/MP/drainage retinotomy/FAX/1000cs SO OS (03/15/24, Kasetty/Vajzovic/[place]) Intra-op: PVR total detachment, preretinal membranes over nerve, no identified break, ST  drainage retinotomy  - Doing well, retina attached and good endolaser, IOP good, good oil fill - BCL fell out last night, no epi defect - IOL in good position  - Continue PF and atropine  taper - Stop ofloxacin - Erythromycin  ointment at nighttime PRN - Positioning: Not flat on back - RD and endophthalmitis precautions  RTC with Dr. LULLA in 3-4 weeks for POM1 [facility] OS  [phone]   The Chief Complaint, HPI, ROS and PFSHx as documented were reviewed and I agree as documented, or revised as indicated.

## 2024-03-25 ENCOUNTER — Inpatient Hospital Stay (HOSPITAL_BASED_OUTPATIENT_CLINIC_OR_DEPARTMENT_OTHER)
Admission: EM | Admit: 2024-03-25 | Discharge: 2024-03-30 | DRG: 871 | Discharge status: Against Medical Advice | Attending: Internal Medicine | Admitting: Internal Medicine

## 2024-03-25 ENCOUNTER — Emergency Department (HOSPITAL_BASED_OUTPATIENT_CLINIC_OR_DEPARTMENT_OTHER)

## 2024-03-25 ENCOUNTER — Emergency Department (HOSPITAL_BASED_OUTPATIENT_CLINIC_OR_DEPARTMENT_OTHER): Admitting: Radiology

## 2024-03-25 ENCOUNTER — Other Ambulatory Visit: Payer: Self-pay

## 2024-03-25 DIAGNOSIS — D631 Anemia in chronic kidney disease: Secondary | ICD-10-CM | POA: Diagnosis present

## 2024-03-25 DIAGNOSIS — K810 Acute cholecystitis: Secondary | ICD-10-CM | POA: Diagnosis present

## 2024-03-25 DIAGNOSIS — E872 Acidosis, unspecified: Secondary | ICD-10-CM

## 2024-03-25 DIAGNOSIS — E1010 Type 1 diabetes mellitus with ketoacidosis without coma: Secondary | ICD-10-CM | POA: Diagnosis present

## 2024-03-25 DIAGNOSIS — R918 Other nonspecific abnormal finding of lung field: Secondary | ICD-10-CM

## 2024-03-25 DIAGNOSIS — A419 Sepsis, unspecified organism: Secondary | ICD-10-CM | POA: Diagnosis not present

## 2024-03-25 DIAGNOSIS — Z8249 Family history of ischemic heart disease and other diseases of the circulatory system: Secondary | ICD-10-CM

## 2024-03-25 DIAGNOSIS — Z5329 Procedure and treatment not carried out because of patient's decision for other reasons: Secondary | ICD-10-CM | POA: Diagnosis present

## 2024-03-25 DIAGNOSIS — Z87891 Personal history of nicotine dependence: Secondary | ICD-10-CM

## 2024-03-25 DIAGNOSIS — E1022 Type 1 diabetes mellitus with diabetic chronic kidney disease: Secondary | ICD-10-CM | POA: Diagnosis present

## 2024-03-25 DIAGNOSIS — R7989 Other specified abnormal findings of blood chemistry: Secondary | ICD-10-CM

## 2024-03-25 DIAGNOSIS — E109 Type 1 diabetes mellitus without complications: Secondary | ICD-10-CM | POA: Diagnosis present

## 2024-03-25 DIAGNOSIS — R000 Tachycardia, unspecified: Secondary | ICD-10-CM | POA: Diagnosis not present

## 2024-03-25 DIAGNOSIS — Z833 Family history of diabetes mellitus: Secondary | ICD-10-CM

## 2024-03-25 DIAGNOSIS — I340 Nonrheumatic mitral (valve) insufficiency: Secondary | ICD-10-CM | POA: Diagnosis present

## 2024-03-25 DIAGNOSIS — J690 Pneumonitis due to inhalation of food and vomit: Secondary | ICD-10-CM | POA: Diagnosis present

## 2024-03-25 DIAGNOSIS — G9341 Metabolic encephalopathy: Secondary | ICD-10-CM | POA: Diagnosis present

## 2024-03-25 DIAGNOSIS — D649 Anemia, unspecified: Secondary | ICD-10-CM | POA: Insufficient documentation

## 2024-03-25 DIAGNOSIS — K219 Gastro-esophageal reflux disease without esophagitis: Secondary | ICD-10-CM | POA: Diagnosis present

## 2024-03-25 DIAGNOSIS — E8770 Fluid overload, unspecified: Secondary | ICD-10-CM | POA: Diagnosis present

## 2024-03-25 DIAGNOSIS — E10319 Type 1 diabetes mellitus with unspecified diabetic retinopathy without macular edema: Secondary | ICD-10-CM | POA: Diagnosis present

## 2024-03-25 DIAGNOSIS — J189 Pneumonia, unspecified organism: Principal | ICD-10-CM | POA: Insufficient documentation

## 2024-03-25 DIAGNOSIS — Z91158 Patient's noncompliance with renal dialysis for other reason: Secondary | ICD-10-CM

## 2024-03-25 DIAGNOSIS — R5381 Other malaise: Secondary | ICD-10-CM | POA: Diagnosis present

## 2024-03-25 DIAGNOSIS — H3562 Retinal hemorrhage, left eye: Secondary | ICD-10-CM | POA: Diagnosis present

## 2024-03-25 DIAGNOSIS — I10 Essential (primary) hypertension: Secondary | ICD-10-CM | POA: Diagnosis present

## 2024-03-25 DIAGNOSIS — E1110 Type 2 diabetes mellitus with ketoacidosis without coma: Secondary | ICD-10-CM | POA: Diagnosis present

## 2024-03-25 DIAGNOSIS — I422 Other hypertrophic cardiomyopathy: Secondary | ICD-10-CM | POA: Diagnosis present

## 2024-03-25 DIAGNOSIS — I2489 Other forms of acute ischemic heart disease: Secondary | ICD-10-CM | POA: Diagnosis present

## 2024-03-25 DIAGNOSIS — Z79899 Other long term (current) drug therapy: Secondary | ICD-10-CM

## 2024-03-25 DIAGNOSIS — Z992 Dependence on renal dialysis: Secondary | ICD-10-CM

## 2024-03-25 DIAGNOSIS — I120 Hypertensive chronic kidney disease with stage 5 chronic kidney disease or end stage renal disease: Secondary | ICD-10-CM | POA: Diagnosis present

## 2024-03-25 DIAGNOSIS — H4312 Vitreous hemorrhage, left eye: Secondary | ICD-10-CM | POA: Diagnosis present

## 2024-03-25 DIAGNOSIS — E8809 Other disorders of plasma-protein metabolism, not elsewhere classified: Secondary | ICD-10-CM | POA: Diagnosis present

## 2024-03-25 DIAGNOSIS — N186 End stage renal disease: Secondary | ICD-10-CM | POA: Diagnosis present

## 2024-03-25 LAB — COMPREHENSIVE METABOLIC PANEL WITH GFR
ALT: 46 U/L — ABNORMAL HIGH (ref 0–44)
AST: 54 U/L — ABNORMAL HIGH (ref 15–41)
Albumin: 4 g/dL (ref 3.5–5.0)
Alkaline Phosphatase: 136 U/L — ABNORMAL HIGH (ref 38–126)
Anion gap: 36 — ABNORMAL HIGH (ref 5–15)
BUN: 56 mg/dL — ABNORMAL HIGH (ref 6–20)
CO2: 17 mmol/L — ABNORMAL LOW (ref 22–32)
Calcium: 8.7 mg/dL — ABNORMAL LOW (ref 8.9–10.3)
Chloride: 86 mmol/L — ABNORMAL LOW (ref 98–111)
Creatinine, Ser: 6.14 mg/dL — ABNORMAL HIGH (ref 0.61–1.24)
GFR, Estimated: 12 mL/min — ABNORMAL LOW (ref >60)
Glucose, Bld: 439 mg/dL — ABNORMAL HIGH (ref 70–99)
Potassium: 4.9 mmol/L (ref 3.5–5.1)
Sodium: 139 mmol/L (ref 135–145)
Total Bilirubin: 0.8 mg/dL (ref 0.0–1.2)
Total Protein: 6.5 g/dL (ref 6.5–8.1)

## 2024-03-25 LAB — I-STAT VENOUS BLOOD GAS, ED
Acid-base deficit: 4 mmol/L — ABNORMAL HIGH (ref 0.0–2.0)
Bicarbonate: 19.6 mmol/L — ABNORMAL LOW (ref 20.0–28.0)
Calcium, Ion: 0.98 mmol/L — ABNORMAL LOW (ref 1.15–1.40)
HCT: 50 % (ref 39.0–52.0)
Hemoglobin: 17 g/dL (ref 13.0–17.0)
O2 Saturation: 89 %
Potassium: 4.5 mmol/L (ref 3.5–5.1)
Sodium: 134 mmol/L — ABNORMAL LOW (ref 135–145)
TCO2: 21 mmol/L — ABNORMAL LOW (ref 22–32)
pCO2, Ven: 32 mmHg — ABNORMAL LOW (ref 44–60)
pH, Ven: 7.395 (ref 7.25–7.43)
pO2, Ven: 56 mmHg — ABNORMAL HIGH (ref 32–45)

## 2024-03-25 LAB — TROPONIN T, HIGH SENSITIVITY
Troponin T High Sensitivity: 514 ng/L (ref <19)
Troponin T High Sensitivity: 453 ng/L (ref <19)

## 2024-03-25 LAB — CBC
HCT: 24.2 % — ABNORMAL LOW (ref 39.0–52.0)
Hemoglobin: 8.1 g/dL — ABNORMAL LOW (ref 13.0–17.0)
MCH: 31.5 pg (ref 26.0–34.0)
MCHC: 33.5 g/dL (ref 30.0–36.0)
MCV: 94.2 fL (ref 80.0–100.0)
Platelets: 295 K/uL (ref 150–400)
RBC: 2.57 MIL/uL — ABNORMAL LOW (ref 4.22–5.81)
RDW: 14.5 % (ref 11.5–15.5)
WBC: 15 K/uL — ABNORMAL HIGH (ref 4.0–10.5)
nRBC: 0 % (ref 0.0–0.2)

## 2024-03-25 LAB — CBG MONITORING, ED
Glucose-Capillary: 396 mg/dL — ABNORMAL HIGH (ref 70–99)
Glucose-Capillary: 479 mg/dL — ABNORMAL HIGH (ref 70–99)
Glucose-Capillary: 549 mg/dL (ref 70–99)

## 2024-03-25 LAB — BETA-HYDROXYBUTYRIC ACID: Beta-Hydroxybutyric Acid: >8 mmol/L — ABNORMAL HIGH (ref 0.05–0.27)

## 2024-03-25 LAB — LACTIC ACID, PLASMA
Lactic Acid, Venous: 2.7 mmol/L (ref 0.5–1.9)
Lactic Acid, Venous: 3.4 mmol/L (ref 0.5–1.9)

## 2024-03-25 LAB — RESP PANEL BY RT-PCR (RSV, FLU A&B, COVID)  RVPGX2
Influenza A by PCR: NEGATIVE
Influenza B by PCR: NEGATIVE
Resp Syncytial Virus by PCR: NEGATIVE
SARS Coronavirus 2 by RT PCR: NEGATIVE

## 2024-03-25 LAB — MAGNESIUM: Magnesium: 2.4 mg/dL (ref 1.7–2.4)

## 2024-03-25 LAB — ETHANOL: Alcohol, Ethyl (B): <15 mg/dL (ref <15)

## 2024-03-25 MED ORDER — ALUM & MAG HYDROXIDE-SIMETH 200-200-20 MG/5ML PO SUSP
30.0000 mL | Freq: Once | ORAL | Status: AC
  Administered 2024-03-25: 30 mL via ORAL
  Filled 2024-03-25: qty 30

## 2024-03-25 MED ORDER — SODIUM CHLORIDE 0.9 % IV BOLUS
500.0000 mL | Freq: Once | INTRAVENOUS | Status: AC
  Administered 2024-03-25: 500 mL via INTRAVENOUS

## 2024-03-25 MED ORDER — INSULIN REGULAR(HUMAN) IN NACL 100-0.9 UT/100ML-% IV SOLN
INTRAVENOUS | Status: AC
  Administered 2024-03-25: 8 [IU]/h via INTRAVENOUS
  Administered 2024-03-25: 10 [IU]/h via INTRAVENOUS
  Administered 2024-03-26: 6 [IU]/h via INTRAVENOUS
  Administered 2024-03-28: 0.7 [IU]/h via INTRAVENOUS
  Filled 2024-03-25 (×3): qty 100

## 2024-03-25 MED ORDER — SODIUM CHLORIDE 0.9 % IV BOLUS
500.0000 mL | Freq: Once | INTRAVENOUS | Status: AC
Start: 2024-03-25 — End: 2024-03-25
  Administered 2024-03-25: 500 mL via INTRAVENOUS

## 2024-03-25 MED ORDER — FENTANYL CITRATE PF 50 MCG/ML IJ SOSY
50.0000 ug | PREFILLED_SYRINGE | Freq: Once | INTRAMUSCULAR | Status: AC
  Administered 2024-03-25: 50 ug via INTRAVENOUS
  Filled 2024-03-25: qty 1

## 2024-03-25 MED ORDER — ONDANSETRON HCL 4 MG/2ML IJ SOLN
4.0000 mg | Freq: Once | INTRAMUSCULAR | Status: AC
  Administered 2024-03-25: 4 mg via INTRAVENOUS
  Filled 2024-03-25: qty 2

## 2024-03-25 MED ORDER — DEXTROSE IN LACTATED RINGERS 5 % IV SOLN: INTRAVENOUS | Status: DC

## 2024-03-25 MED ORDER — IOHEXOL 300 MG/ML  SOLN
80.0000 mL | Freq: Once | INTRAMUSCULAR | Status: AC | PRN
  Administered 2024-03-25: 80 mL via INTRAVENOUS

## 2024-03-25 MED ORDER — FENTANYL CITRATE PF 50 MCG/ML IJ SOSY
25.0000 ug | PREFILLED_SYRINGE | Freq: Once | INTRAMUSCULAR | Status: AC
  Administered 2024-03-25: 25 ug via INTRAVENOUS
  Filled 2024-03-25: qty 1

## 2024-03-25 MED ORDER — SODIUM CHLORIDE 0.9 % IV SOLN
500.0000 mg | Freq: Once | INTRAVENOUS | Status: AC
  Administered 2024-03-25: 500 mg via INTRAVENOUS
  Filled 2024-03-25: qty 5

## 2024-03-25 MED ORDER — DEXTROSE 50 % IV SOLN: 0.0000 mL | INTRAVENOUS | Status: DC | PRN

## 2024-03-25 MED ORDER — INSULIN ASPART 100 UNIT/ML IJ SOLN
5.0000 [IU] | Freq: Once | INTRAMUSCULAR | Status: AC
  Administered 2024-03-25: 5 [IU] via SUBCUTANEOUS

## 2024-03-25 MED ORDER — SODIUM CHLORIDE 0.9 % IV SOLN
1.0000 g | Freq: Once | INTRAVENOUS | Status: AC
  Administered 2024-03-25: 1 g via INTRAVENOUS
  Filled 2024-03-25: qty 10

## 2024-03-25 NOTE — ED Triage Notes (Signed)
 Pt POV from dialysis, per girlfriend pt appeared to seem to be confused during dialysis so they advised he come to ED. Per girlfriend pt has been fatigued past few days, endorses loss of appetite and multiple episodes of emesis. CBG 396 in triage.

## 2024-03-25 NOTE — ED Notes (Signed)
One set of blood cultures collected at this time

## 2024-03-25 NOTE — ED Notes (Signed)
 ED Provider at bedside.

## 2024-03-25 NOTE — ED Notes (Signed)
 Patient transported to CT

## 2024-03-25 NOTE — ED Notes (Signed)
 2 sets of Blood Cultures collected.

## 2024-03-25 NOTE — ED Notes (Addendum)
 Pt feeling nauseous and having multiple episodes of emesis. PA made aware.

## 2024-03-25 NOTE — ED Provider Notes (Cosign Needed Addendum)
 [place] EMERGENCY DEPARTMENT AT [facility] Provider Note   CSN: [id number] Arrival date & time: 03/25/24  [id number]     Patient presents with: Altered Mental Status   John [name] is a 33 y.o. male.   Patient with history of type 1 diabetes (age 56), diabetic retinopathy, ESRD on dialysis (started March 2024) --presents to the emergency department today for evaluation of encephalopathy.  Patient had dialysis today, full treatment.  Most of history from the patient's family member.  Patient has had a lot of medical appointments recently, especially in regards to his eye procedure.  He slept all weekend.  Initially thought that fatigue was due to being out a lot recently.  Patient got up and went to dialysis today.  He was having some confusion, difficulty following commands, while at his session today.  Family member was brought back at the end of the session and patient was having difficulty standing up.  He seemed confused and slow to respond.  He has been more pale than baseline.  Decision made to come to the emergency department for evaluation.  Patient has had some episodes of vomiting and a lot of nausea with loss of appetite over the past 2 days.  Patient complains of some focal abdominal pain.  No cough or shortness of breath.  No noted fevers.  No swelling.  Patient's Dexcom has been broken and unclear what his recent sugars have been running.       Prior to Admission medications   Medication Sig Start Date End Date Taking? Authorizing Provider  acetaminophen  (TYLENOL ) 500 MG tablet Take 1,000 mg by mouth every 6 (six) hours as needed for mild pain or moderate pain.    [provider]  amLODipine  (NORVASC ) 10 MG tablet Take 1 tablet (10 mg total) by mouth daily. 10/29/22   Elgergawy, Brayton RAMAN, MD  bismuth subsalicylate (PEPTO BISMOL) 262 MG chewable tablet Chew 524 mg by mouth daily as needed for indigestion or diarrhea or loose stools.    [provider]   Calcium  Carbonate Antacid (TUMS PO) Take 1 tablet by mouth 2 (two) times daily as needed (heartburn).    [provider]  Glucagon (BAQSIMI ONE PACK) 3 MG/DOSE POWD Place 1 spray into the nose as needed (hypoglycemic episode).    [provider]  glucose blood (ONE TOUCH ULTRA TEST) test strip Use as instructed 3x daily 05/04/17   Riccio, Angela C, DO  Insulin  Glargine (BASAGLAR  KWIKPEN) 100 UNIT/ML Inject 18 Units into the skin at bedtime. 09/06/23   [provider]  Insulin  Pen Needle 31G X 8 MM MISC 1 each by Does not apply route 4 (four) times daily -  with meals and at bedtime. 03/24/15   Newlin, Enobong, MD  labetalol  (NORMODYNE ) 100 MG tablet Take 1 tablet (100 mg total) by mouth 2 (two) times daily. 10/29/22   Elgergawy, Brayton RAMAN, MD  Melatonin 10 MG CHEW Chew 10-20 mg by mouth at bedtime as needed (Sleep).    [provider]  Multiple Vitamins-Minerals (PRESERVISION AREDS 2 PO) Take 1 tablet by mouth in the morning.    [provider]  NOVOLOG  FLEXPEN 100 UNIT/ML FlexPen Inject 3-6 Units into the skin 3 (three) times daily with meals. 07/27/23   [provider]  sevelamer carbonate (RENVELA) 800 MG tablet Take 2,400 mg by mouth See admin instructions. Take 3 tablets (2400 mg) by mouth with each meal & with each snack    [provider]  simethicone  (MYLICON) 80 MG chewable tablet Chew 80 mg by mouth every 6 (six) hours as needed for flatulence.    [provider]    Allergies: Patient has no known allergies.    Review of Systems  Updated Vital Signs BP 126/81   Pulse 86   Temp 99 F (37.2 C) (Oral)   Resp 17   SpO2 98%   Physical Exam Vitals and nursing note reviewed.  Constitutional:      General: He is not in acute distress.    Appearance: He is well-developed. He is ill-appearing.     Comments: Patient appears pale, ill-appearing.  HENT:     Head: Normocephalic and atraumatic.     Right Ear: External ear  normal.     Left Ear: External ear normal.     Nose: Nose normal.     Mouth/Throat:     Mouth: Mucous membranes are dry.     Comments: Dry mucous membranes Eyes:     General:        Right eye: No discharge.        Left eye: No discharge.     Extraocular Movements: Extraocular movements intact.     Comments: Postsurgical changes of the eye, left pupil is dilated compared to the right.  This is chronic.  Cardiovascular:     Rate and Rhythm: Normal rate and regular rhythm.     Heart sounds: Normal heart sounds.  Pulmonary:     Effort: Pulmonary effort is normal.     Breath sounds: Normal breath sounds.  Abdominal:     Palpations: Abdomen is soft.     Tenderness: There is abdominal tenderness. There is no guarding or rebound.     Comments: Mild generalized tenderness, no rebound or guarding.  Musculoskeletal:        General: Normal range of motion.     Cervical back: Normal range of motion and neck supple.  Skin:    General: Skin is warm and dry.  Neurological:     Mental Status: He is alert.     (all labs ordered are listed, but only abnormal results are displayed) Labs Reviewed  COMPREHENSIVE METABOLIC PANEL WITH GFR - Abnormal; Notable for the following components:      Result Value   Chloride 86 (*)    CO2 17 (*)    Glucose, Bld 439 (*)    BUN 56 (*)    Creatinine, Ser 6.14 (*)    Calcium  8.7 (*)    AST 54 (*)    ALT 46 (*)    Alkaline Phosphatase 136 (*)    GFR, Estimated 12 (*)    Anion gap 36 (*)    All other components within normal limits  CBC - Abnormal; Notable for the following components:   WBC 15.0 (*)    RBC 2.57 (*)    Hemoglobin 8.1 (*)    HCT 24.2 (*)    All other components within normal limits  LACTIC ACID, PLASMA - Abnormal; Notable for the following components:   Lactic Acid, Venous 2.7 (*)    All other components within normal limits  LACTIC ACID, PLASMA - Abnormal; Notable for the following components:   Lactic Acid, Venous 3.4 (*)    All  other components within normal limits  CBG MONITORING, ED - Abnormal; Notable for the following components:   Glucose-Capillary 396 (*)    All other components within normal limits  I-STAT VENOUS BLOOD GAS, ED - Abnormal; Notable  for the following components:   pCO2, Ven 32.0 (*)    pO2, Ven 56 (*)    Bicarbonate 19.6 (*)    TCO2 21 (*)    Acid-base deficit 4.0 (*)    Sodium 134 (*)    Calcium , Ion 0.98 (*)    All other components within normal limits  CBG MONITORING, ED - Abnormal; Notable for the following components:   Glucose-Capillary 479 (*)    All other components within normal limits  TROPONIN T, HIGH SENSITIVITY - Abnormal; Notable for the following components:   Troponin T High Sensitivity 514 (*)    All other components within normal limits  RESP PANEL BY RT-PCR (RSV, FLU A&B, COVID)  RVPGX2  CULTURE, BLOOD (ROUTINE X 2)  CULTURE, BLOOD (ROUTINE X 2)  MAGNESIUM  ETHANOL  URINALYSIS, ROUTINE W REFLEX MICROSCOPIC  BETA-HYDROXYBUTYRIC ACID    EKG: EKG Interpretation Date/Time:  Monday March 25 2024 17:00:23 EDT Ventricular Rate:  110 PR Interval:  128 QRS Duration:  87 QT Interval:  381 QTC Calculation: 516 R Axis:   83  Text Interpretation: Sinus tachycardia Probable left atrial enlargement Probable left ventricular hypertrophy Prolonged QT interval No significant change since last tracing Confirmed by Dean Clarity [phone]) on 03/25/2024 5:03:28 PM  Radiology: US  Abdomen Limited RUQ (LIVER/GB) Result Date: 03/25/2024 EXAM: Right Upper Quadrant Abdominal Ultrasound 03/25/2024 10:25:00 PM TECHNIQUE: Real-time ultrasonography of the right upper quadrant of the abdomen was performed. COMPARISON: None available. CLINICAL HISTORY: Vomiting; Abnormal CT scan. ICD-10 codes: R11.0, R93.8. FINDINGS: LIVER: The liver demonstrates normal echogenicity. No intrahepatic biliary ductal dilatation. No evidence of mass. BILIARY SYSTEM: There is pericholecystic fluid and wall  thickening of the gallbladder. No cholelithiasis. Negative sonographic Murphy's sign. Common bile duct is mildly dilated measuring 7 mm. RIGHT KIDNEY: The right kidney is grossly unremarkable in appearances without evidence of hydronephrosis, echogenic calculi or worrisome mass lesions. OTHER: No right upper quadrant ascites. IMPRESSION: 1. Gallbladder wall thickening and pericholecystic fluid without cholelithiasis or sonographic Murphy's sign. Findings are suggestive but not definitive for acute cholecystitis 2. Mildly dilated common bile duct measuring 7 mm. Electronically signed by: Norman Gatlin MD 03/25/2024 11:13 PM EDT RP Workstation: [id number]   CT CHEST ABDOMEN PELVIS W CONTRAST Result Date: 03/25/2024 CLINICAL DATA:  Sepsis concern for sepsis, abnl CXR, vomiting and abdominal pain POV from dialysis, per girlfriend pt appeared to seem to be confused during dialysis so they advised he come to ED. EXAM: CT CHEST, ABDOMEN, AND PELVIS WITH CONTRAST TECHNIQUE: Multidetector CT imaging of the chest, abdomen and pelvis was performed following the standard protocol during bolus administration of intravenous contrast. RADIATION DOSE REDUCTION: This exam was performed according to the departmental dose-optimization program which includes automated exposure control, adjustment of the mA and/or kV according to patient size and/or use of iterative reconstruction technique. CONTRAST:  80mL OMNIPAQUE  IOHEXOL  300 MG/ML  SOLN COMPARISON:  CT abdomen pelvis 10/21/2022, CT renal 03/16/2015 FINDINGS: CT CHEST FINDINGS Cardiovascular: Normal heart size. No significant pericardial effusion. The thoracic aorta is normal in caliber. No atherosclerotic plaque of the thoracic aorta. No coronary artery calcifications. Mediastinum/Nodes: No enlarged mediastinal, hilar, or axillary lymph nodes. Thyroid  gland, trachea, and esophagus demonstrate no significant findings. Lungs/Pleura: Right lower lobe passive atelectasis. Right upper  lobe peribronchovascular patchy ground-glass and centrally consolidative airspace opacities. T0 a smaller extent similar finding at the left anterior apex, bilateral lower lobes and right middle lobe. No pulmonary mass. Trace to small volume right pleural effusion. No pneumothorax.  Musculoskeletal: Bilateral gynecomastia, right slightly greater than left. No suspicious lytic or blastic osseous lesions. No acute displaced fracture. CT ABDOMEN PELVIS FINDINGS Hepatobiliary: No focal liver abnormality. No gallstones, gallbladder wall thickening. Trace pericholecystic fluid. No biliary dilatation. Pancreas: No focal lesion. Normal pancreatic contour. No surrounding inflammatory changes. No main pancreatic ductal dilatation. Spleen: Normal in size without focal abnormality. Adrenals/Urinary Tract: No adrenal nodule bilaterally. Bilateral kidneys enhance symmetrically. No hydronephrosis. No hydroureter. The urinary bladder is unremarkable. Stomach/Bowel: Stomach is within normal limits. No evidence of bowel wall thickening or dilatation. No pneumatosis appendix appears normal. 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 Abdominal aorta or iliac aneurysm. No abdominal, pelvic, or inguinal lymphadenopathy. Reproductive: Prostate is unremarkable. Other: No intraperitoneal free fluid. No intraperitoneal free gas. No organized fluid collection. Musculoskeletal: No abdominal wall hernia or abnormality. No suspicious lytic or blastic osseous lesions. No acute displaced fracture. IMPRESSION: 1. Right upper lobe peribronchovascular patchy ground-glass and centrally consolidative airspace opacities. To a smaller extent similar findings at the left anterior apex, bilateral lower lobes and right middle lobe. Finding could represent infection/inflammation with alveolar hemorrhage not excluded. 2.  Trace to small  volume right pleural effusion. 3. Nonspecific pericholecystic free fluid. Correlate with liver function tests and consider right upper quadrant ultrasound if clinically indicated. 4. 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 5. Bilateral gynecomastia, right slightly greater than left. Recommend correlation with physical exam. Electronically Signed   By: Morgane  Naveau M.D.   On: 03/25/2024 20:57   DG Chest 2 View Result Date: 03/25/2024 CLINICAL DATA:  Altered level of consciousness, end-stage renal disease. Fatigue. Vomiting. EXAM: CHEST - 2 VIEW COMPARISON:  10/25/2022 FINDINGS: Chronic cardiomegaly. Vascular congestion. Additional patchy opacity in the right suprahilar lung. Small left pleural effusion. No pneumothorax. No acute osseous findings. IMPRESSION: 1. Chronic cardiomegaly with vascular congestion. 2. Patchy opacity in the right suprahilar lung, may be atelectasis or pneumonia. 3. Small left pleural effusion. Electronically Signed   By: Andrea Gasman M.D.   On: 03/25/2024 18:09   CT Head Wo Contrast Result Date: 03/25/2024 CLINICAL DATA:  Mental status change, unknown cause ALOC, ESRD EXAM: CT HEAD WITHOUT CONTRAST TECHNIQUE: Contiguous axial images were obtained from the base of the skull through the vertex without intravenous contrast. RADIATION DOSE REDUCTION: This exam was performed according to the departmental dose-optimization program which includes automated exposure control, adjustment of the mA and/or kV according to patient size and/or use of iterative reconstruction technique. COMPARISON:  Remote CT 06/16/2009 FINDINGS: Brain: Segmental imaging due to motion. No intracranial hemorrhage, mass effect, or midline shift. No hydrocephalus. The basilar cisterns are patent. No evidence of territorial infarct or acute ischemia. No extra-axial or intracranial fluid collection.  Vascular: Atherosclerosis of skullbase vasculature without hyperdense vessel or abnormal calcification. Skull: No fracture or focal lesion. Sinuses/Orbits: Diffuse hyperdensity in the left lobe. Right cataract resection. Other: None. IMPRESSION: 1. No acute intracranial abnormality. 2. Diffuse hyperdensity in the left globe, may be related to prior surgery or trauma. Recommend correlation with clinical history. Electronically Signed   By: Andrea Gasman M.D.   On: 03/25/2024 18:07     Procedures   Medications Ordered in the ED  azithromycin  (ZITHROMAX ) 500 mg in sodium chloride  0.9 % 250 mL IVPB (500 mg Intravenous New  Bag/Given 03/25/24 2058)  insulin  regular (NOVOLIN R) 100 units/mL injection 5 Units (has no administration in time range)  ondansetron  (ZOFRAN ) injection 4 mg (4 mg Intravenous Given 03/25/24 1813)  fentaNYL  (SUBLIMAZE ) injection 25 mcg (25 mcg Intravenous Given 03/25/24 1813)  cefTRIAXone  (ROCEPHIN ) 1 g in sodium chloride  0.9 % 100 mL IVPB (0 g Intravenous Stopped 03/25/24 2057)  sodium chloride  0.9 % bolus 500 mL (0 mLs Intravenous Stopped 03/25/24 2057)  alum & mag hydroxide-simeth (MAALOX/MYLANTA) 200-200-20 MG/5ML suspension 30 mL (30 mLs Oral Given 03/25/24 2009)  iohexol  (OMNIPAQUE ) 300 MG/ML solution 80 mL (80 mLs Intravenous Contrast Given 03/25/24 1940)  fentaNYL  (SUBLIMAZE ) injection 50 mcg (50 mcg Intravenous Given 03/25/24 2109)  ondansetron  (ZOFRAN ) injection 4 mg (4 mg Intravenous Given 03/25/24 2108)  sodium chloride  0.9 % bolus 500 mL (500 mLs Intravenous New Bag/Given 03/25/24 2116)    ED Course  Patient seen and examined. History obtained directly from patient and family member.  Reviewed hospitalization notes from 2024 for baseline history.  Labs/EKG: Ordered CBC, CMP, i-STAT VBG, BHB, magnesium, lactic acid, ethanol.  Imaging: Ordered CT head, chest x-ray.  Medications/Fluids: None ordered  Most recent vital signs reviewed and are as follows: BP 126/81   Pulse 86    Temp 99 F (37.2 C) (Oral)   Resp 17   SpO2 98%   Initial impression: Encephalopathy, rule out DKA.  Other possibilities include infection, worsening renal failure/uremia, intoxication.  7:23 PM Reassessment performed. Patient appears stable, color is a bit better.  He is drinking some water.  Labs personally reviewed and interpreted including: CBC with elevated white blood cell count of 15,000, hemoglobin at 8.1 likely due to chronic kidney disease stable; CMP with normal sodium and potassium however chloride is low at 86, anion gap is elevated at 36; glucose elevated at 439 with a bicarbonate of 17, BUN 56, mildly elevated transaminases; magnesium normal at 2.4; ethanol normal; lactate elevated at 2.7; i-STAT VBG with normal pH, bicarb 19.6.  Glucose elevated.  Imaging personally visualized and interpreted including: Chest x-ray, question of right-sided suprahilar infiltrate versus atelectasis; CT head without acute findings.  Reviewed pertinent lab work and imaging with patient at bedside. Questions answered.   Most current vital signs reviewed and are as follows: BP (!) 176/104   Pulse (!) 109   Temp 99 F (37.2 C) (Oral)   Resp (!) 22   SpO2 100%   Plan: Discussed with Dr. Dean, she has seen the patient earlier.  Will give small fluid bolus given elevated lactate and low chloride.  500 cc NaCl ordered.  Will go ahead and image chest, abdomen, pelvis to evaluate for pneumonia.  Will go ahead and give antibiotics in the interim.  Patient has been on dialysis for more than a year, makes very little urine.  Discussed that harm from CT contrast likely does not outweigh the risk of missing a potential serious illness in these areas given lab abnormalities noted today.  We discussed risks and benefits.  Will proceed with CT imaging.  9:39 PM Reassessment performed. Patient appears stable on several rechecks, mentation stable.  Labs personally reviewed and interpreted including: Lactic  2.7 >> 3.4, additional fluids ordered; troponin 514; respiratory panel negative.  Blood cultures added.  CBG upper 400s, 5 units subcutaneous insulin  ordered.  Imaging personally visualized and interpreted including: CT of the chest, abdomen, pelvis suggestive of multifocal pneumonia, also nonspecific pericholecystic fluid.  Right upper quadrant ultrasound ordered.  Reviewed pertinent lab work and  imaging with patient at bedside. Questions answered.   Most current vital signs reviewed and are as follows: BP 138/78   Pulse (!) 109   Temp 98 F (36.7 C) (Axillary)   Resp (!) 21   SpO2 100%   Plan: Will need admission.  I spoke with Dr. Franky.  Accepts to hospitalist service.  Would like repeat troponin and right upper quadrant ultrasound, text back.  11:13 PM BHB earlier resulted at >8.00.  This increases concern for diabetic ketoacidosis.  Given the anion gap and elevated BHB, will start glucose stabilizer protocol, closely monitoring blood sugar.  Will forego fluid boluses due to ESRD status. Updated hospitalist.  Currently, patient back from ultrasound, awaiting results.  Troponin is in the lab and running.  US  resulted -- findings suggestive but not definitive for acute cholecystitis. Received Rocephin  earlier.   11:50 PM Troponin 514 >> 479. Dr. Franky updated, placing admit orders.   11:57 PM Pt and family updated. Pt appears stable. Pt asking for some ice chips.   CRITICAL CARE Performed by: Fonda Ruby PA-C Total critical care time: 55 minutes Critical care time was exclusive of separately billable procedures and treating other patients. Critical care was necessary to treat or prevent imminent or life-threatening deterioration. Critical care was time spent personally by me on the following activities: development of treatment plan with patient and/or surrogate as well as nursing, discussions with consultants, evaluation of patient's response to treatment, examination  of patient, obtaining history from patient or surrogate, ordering and performing treatments and interventions, ordering and review of laboratory studies, ordering and review of radiographic studies, pulse oximetry and re-evaluation of patient's condition.                                  Medical Decision Making Amount and/or Complexity of Data Reviewed Labs: ordered. Radiology: ordered.  Risk OTC drugs. Prescription drug management. Decision regarding hospitalization.   ESRD patient with nausea and vomiting, increased lethargy, some disorientation at dialysis today.  Workup shows multifocal pneumonia on CT imaging, elevated white blood cell count.  Patient covered with antibiotics.  Lactate has been elevated, elevated anion gap.  Slightly low bicarb, not to the extent to suggest significant DKA with normal pH.  Patient has received some IV fluids, subcutaneous insulin .  Troponin is elevated, possibly demand, but will need to trend.  Will need further evaluation of.  Cholecystic fluid, transaminases are slightly elevated.  Patient require stepdown admission.       Final diagnoses:  Multifocal pneumonia  Lactic acidosis  Sinus tachycardia  Elevated troponin  ESRD (end stage renal disease) on dialysis [facility])  Diabetic ketoacidosis without coma associated with type 1 diabetes mellitus [facility])    ED Discharge Orders     None          Ruby Fonda, PA-C 03/25/24 2351    Ruby Fonda, PA-C 03/25/24 2357    Dean Clarity, MD 03/26/24 [phone]

## 2024-03-25 NOTE — ED Notes (Signed)
 Will attempt to get patient urine sample at a later time

## 2024-03-25 NOTE — ED Notes (Addendum)
 Pt unable to provide a urine sample at this time.

## 2024-03-26 ENCOUNTER — Encounter (HOSPITAL_COMMUNITY): Payer: Self-pay | Admitting: Internal Medicine

## 2024-03-26 ENCOUNTER — Inpatient Hospital Stay (HOSPITAL_COMMUNITY)

## 2024-03-26 ENCOUNTER — Inpatient Hospital Stay: Admit: 2024-03-26

## 2024-03-26 ENCOUNTER — Encounter (HOSPITAL_COMMUNITY): Payer: Self-pay

## 2024-03-26 DIAGNOSIS — N186 End stage renal disease: Secondary | ICD-10-CM | POA: Insufficient documentation

## 2024-03-26 DIAGNOSIS — Z79899 Other long term (current) drug therapy: Secondary | ICD-10-CM | POA: Diagnosis not present

## 2024-03-26 DIAGNOSIS — R918 Other nonspecific abnormal finding of lung field: Secondary | ICD-10-CM | POA: Diagnosis not present

## 2024-03-26 DIAGNOSIS — E1010 Type 1 diabetes mellitus with ketoacidosis without coma: Secondary | ICD-10-CM | POA: Diagnosis present

## 2024-03-26 DIAGNOSIS — H4312 Vitreous hemorrhage, left eye: Secondary | ICD-10-CM | POA: Diagnosis present

## 2024-03-26 DIAGNOSIS — E10319 Type 1 diabetes mellitus with unspecified diabetic retinopathy without macular edema: Secondary | ICD-10-CM | POA: Diagnosis present

## 2024-03-26 DIAGNOSIS — R7989 Other specified abnormal findings of blood chemistry: Secondary | ICD-10-CM | POA: Diagnosis not present

## 2024-03-26 DIAGNOSIS — G9341 Metabolic encephalopathy: Secondary | ICD-10-CM | POA: Diagnosis present

## 2024-03-26 DIAGNOSIS — I422 Other hypertrophic cardiomyopathy: Secondary | ICD-10-CM

## 2024-03-26 DIAGNOSIS — E8770 Fluid overload, unspecified: Secondary | ICD-10-CM | POA: Diagnosis present

## 2024-03-26 DIAGNOSIS — K219 Gastro-esophageal reflux disease without esophagitis: Secondary | ICD-10-CM | POA: Diagnosis present

## 2024-03-26 DIAGNOSIS — Z833 Family history of diabetes mellitus: Secondary | ICD-10-CM | POA: Diagnosis not present

## 2024-03-26 DIAGNOSIS — E1110 Type 2 diabetes mellitus with ketoacidosis without coma: Secondary | ICD-10-CM | POA: Diagnosis not present

## 2024-03-26 DIAGNOSIS — Z5329 Procedure and treatment not carried out because of patient's decision for other reasons: Secondary | ICD-10-CM | POA: Diagnosis present

## 2024-03-26 DIAGNOSIS — I340 Nonrheumatic mitral (valve) insufficiency: Secondary | ICD-10-CM | POA: Diagnosis present

## 2024-03-26 DIAGNOSIS — D649 Anemia, unspecified: Secondary | ICD-10-CM | POA: Insufficient documentation

## 2024-03-26 DIAGNOSIS — R000 Tachycardia, unspecified: Secondary | ICD-10-CM | POA: Diagnosis present

## 2024-03-26 DIAGNOSIS — D631 Anemia in chronic kidney disease: Secondary | ICD-10-CM | POA: Diagnosis present

## 2024-03-26 DIAGNOSIS — J189 Pneumonia, unspecified organism: Secondary | ICD-10-CM | POA: Insufficient documentation

## 2024-03-26 DIAGNOSIS — A419 Sepsis, unspecified organism: Secondary | ICD-10-CM | POA: Diagnosis present

## 2024-03-26 DIAGNOSIS — Z992 Dependence on renal dialysis: Secondary | ICD-10-CM | POA: Diagnosis not present

## 2024-03-26 DIAGNOSIS — R5381 Other malaise: Secondary | ICD-10-CM | POA: Diagnosis present

## 2024-03-26 DIAGNOSIS — E8809 Other disorders of plasma-protein metabolism, not elsewhere classified: Secondary | ICD-10-CM | POA: Diagnosis present

## 2024-03-26 DIAGNOSIS — I120 Hypertensive chronic kidney disease with stage 5 chronic kidney disease or end stage renal disease: Secondary | ICD-10-CM | POA: Diagnosis present

## 2024-03-26 DIAGNOSIS — Z87891 Personal history of nicotine dependence: Secondary | ICD-10-CM | POA: Diagnosis not present

## 2024-03-26 DIAGNOSIS — J690 Pneumonitis due to inhalation of food and vomit: Secondary | ICD-10-CM | POA: Diagnosis present

## 2024-03-26 DIAGNOSIS — I2489 Other forms of acute ischemic heart disease: Secondary | ICD-10-CM | POA: Diagnosis present

## 2024-03-26 DIAGNOSIS — E1022 Type 1 diabetes mellitus with diabetic chronic kidney disease: Secondary | ICD-10-CM | POA: Diagnosis present

## 2024-03-26 DIAGNOSIS — K810 Acute cholecystitis: Secondary | ICD-10-CM | POA: Diagnosis present

## 2024-03-26 DIAGNOSIS — I10 Essential (primary) hypertension: Secondary | ICD-10-CM | POA: Diagnosis not present

## 2024-03-26 DIAGNOSIS — Z8249 Family history of ischemic heart disease and other diseases of the circulatory system: Secondary | ICD-10-CM | POA: Diagnosis not present

## 2024-03-26 LAB — BETA-HYDROXYBUTYRIC ACID
Beta-Hydroxybutyric Acid: 1.94 mmol/L — ABNORMAL HIGH (ref 0.05–0.27)
Beta-Hydroxybutyric Acid: 1.75 mmol/L — ABNORMAL HIGH (ref 0.05–0.27)
Beta-Hydroxybutyric Acid: 2.87 mmol/L — ABNORMAL HIGH (ref 0.05–0.27)
Beta-Hydroxybutyric Acid: 2.05 mmol/L — ABNORMAL HIGH (ref 0.05–0.27)

## 2024-03-26 LAB — GLUCOSE, CAPILLARY
Glucose-Capillary: 351 mg/dL — ABNORMAL HIGH (ref 70–99)
Glucose-Capillary: 296 mg/dL — ABNORMAL HIGH (ref 70–99)
Glucose-Capillary: 235 mg/dL — ABNORMAL HIGH (ref 70–99)
Glucose-Capillary: 191 mg/dL — ABNORMAL HIGH (ref 70–99)
Glucose-Capillary: 147 mg/dL — ABNORMAL HIGH (ref 70–99)
Glucose-Capillary: 135 mg/dL — ABNORMAL HIGH (ref 70–99)
Glucose-Capillary: 131 mg/dL — ABNORMAL HIGH (ref 70–99)
Glucose-Capillary: 135 mg/dL — ABNORMAL HIGH (ref 70–99)
Glucose-Capillary: 118 mg/dL — ABNORMAL HIGH (ref 70–99)
Glucose-Capillary: 189 mg/dL — ABNORMAL HIGH (ref 70–99)
Glucose-Capillary: 154 mg/dL — ABNORMAL HIGH (ref 70–99)
Glucose-Capillary: 174 mg/dL — ABNORMAL HIGH (ref 70–99)
Glucose-Capillary: 157 mg/dL — ABNORMAL HIGH (ref 70–99)
Glucose-Capillary: 154 mg/dL — ABNORMAL HIGH (ref 70–99)
Glucose-Capillary: 158 mg/dL — ABNORMAL HIGH (ref 70–99)
Glucose-Capillary: 137 mg/dL — ABNORMAL HIGH (ref 70–99)

## 2024-03-26 LAB — RESPIRATORY PANEL BY PCR

## 2024-03-26 LAB — BASIC METABOLIC PANEL WITH GFR
Anion gap: 22 — ABNORMAL HIGH (ref 5–15)
Anion gap: 19 — ABNORMAL HIGH (ref 5–15)
Anion gap: 19 — ABNORMAL HIGH (ref 5–15)
BUN: 82 mg/dL — ABNORMAL HIGH (ref 6–20)
BUN: 85 mg/dL — ABNORMAL HIGH (ref 6–20)
BUN: 85 mg/dL — ABNORMAL HIGH (ref 6–20)
CO2: 22 mmol/L (ref 22–32)
CO2: 21 mmol/L — ABNORMAL LOW (ref 22–32)
CO2: 21 mmol/L — ABNORMAL LOW (ref 22–32)
Calcium: 7.9 mg/dL — ABNORMAL LOW (ref 8.9–10.3)
Calcium: 7.9 mg/dL — ABNORMAL LOW (ref 8.9–10.3)
Calcium: 7.4 mg/dL — ABNORMAL LOW (ref 8.9–10.3)
Chloride: 93 mmol/L — ABNORMAL LOW (ref 98–111)
Chloride: 97 mmol/L — ABNORMAL LOW (ref 98–111)
Chloride: 89 mmol/L — ABNORMAL LOW (ref 98–111)
Creatinine, Ser: 7.42 mg/dL — ABNORMAL HIGH (ref 0.61–1.24)
Creatinine, Ser: 7.92 mg/dL — ABNORMAL HIGH (ref 0.61–1.24)
Creatinine, Ser: 7.99 mg/dL — ABNORMAL HIGH (ref 0.61–1.24)
GFR, Estimated: 9 mL/min — ABNORMAL LOW (ref >60)
GFR, Estimated: 9 mL/min — ABNORMAL LOW (ref >60)
GFR, Estimated: 9 mL/min — ABNORMAL LOW (ref >60)
Glucose, Bld: 320 mg/dL — ABNORMAL HIGH (ref 70–99)
Glucose, Bld: 175 mg/dL — ABNORMAL HIGH (ref 70–99)
Glucose, Bld: 396 mg/dL — ABNORMAL HIGH (ref 70–99)
Potassium: 4.3 mmol/L (ref 3.5–5.1)
Potassium: 5.3 mmol/L — ABNORMAL HIGH (ref 3.5–5.1)
Potassium: 4 mmol/L (ref 3.5–5.1)
Sodium: 137 mmol/L (ref 135–145)
Sodium: 137 mmol/L (ref 135–145)
Sodium: 129 mmol/L — ABNORMAL LOW (ref 135–145)

## 2024-03-26 LAB — PROTIME-INR
INR: 1.5 — ABNORMAL HIGH (ref 0.8–1.2)
Prothrombin Time: 18.7 s — ABNORMAL HIGH (ref 11.4–15.2)

## 2024-03-26 LAB — COMPREHENSIVE METABOLIC PANEL WITH GFR
ALT: 90 U/L — ABNORMAL HIGH (ref 0–44)
AST: 71 U/L — ABNORMAL HIGH (ref 15–41)
Albumin: 2.8 g/dL — ABNORMAL LOW (ref 3.5–5.0)
Alkaline Phosphatase: 89 U/L (ref 38–126)
Anion gap: 17 — ABNORMAL HIGH (ref 5–15)
BUN: 75 mg/dL — ABNORMAL HIGH (ref 6–20)
CO2: 24 mmol/L (ref 22–32)
Calcium: 7.9 mg/dL — ABNORMAL LOW (ref 8.9–10.3)
Chloride: 98 mmol/L (ref 98–111)
Creatinine, Ser: 7 mg/dL — ABNORMAL HIGH (ref 0.61–1.24)
GFR, Estimated: 10 mL/min — ABNORMAL LOW (ref >60)
Glucose, Bld: 585 mg/dL (ref 70–99)
Potassium: 4.6 mmol/L (ref 3.5–5.1)
Sodium: 139 mmol/L (ref 135–145)
Total Bilirubin: 1 mg/dL (ref 0.0–1.2)
Total Protein: 5.3 g/dL — ABNORMAL LOW (ref 6.5–8.1)

## 2024-03-26 LAB — CBG MONITORING, ED
Glucose-Capillary: 414 mg/dL — ABNORMAL HIGH (ref 70–99)
Glucose-Capillary: 459 mg/dL — ABNORMAL HIGH (ref 70–99)
Glucose-Capillary: 482 mg/dL — ABNORMAL HIGH (ref 70–99)
Glucose-Capillary: 485 mg/dL — ABNORMAL HIGH (ref 70–99)

## 2024-03-26 LAB — TROPONIN I (HIGH SENSITIVITY)
Troponin I (High Sensitivity): 199 ng/L (ref <18)
Troponin I (High Sensitivity): 252 ng/L (ref <18)
Troponin I (High Sensitivity): 291 ng/L (ref <18)

## 2024-03-26 LAB — CBC
HCT: 22.6 % — ABNORMAL LOW (ref 39.0–52.0)
Hemoglobin: 7.4 g/dL — ABNORMAL LOW (ref 13.0–17.0)
MCH: 31.6 pg (ref 26.0–34.0)
MCHC: 32.7 g/dL (ref 30.0–36.0)
MCV: 96.6 fL (ref 80.0–100.0)
Platelets: 320 K/uL (ref 150–400)
RBC: 2.34 MIL/uL — ABNORMAL LOW (ref 4.22–5.81)
RDW: 14.4 % (ref 11.5–15.5)
WBC: 11.8 K/uL — ABNORMAL HIGH (ref 4.0–10.5)
nRBC: 0 % (ref 0.0–0.2)

## 2024-03-26 LAB — HIV ANTIBODY (ROUTINE TESTING W REFLEX): HIV Screen 4th Generation wRfx: NONREACTIVE

## 2024-03-26 LAB — HEPATITIS B SURFACE ANTIGEN: Hepatitis B Surface Ag: NONREACTIVE

## 2024-03-26 LAB — PROCALCITONIN: Procalcitonin: 17 ng/mL

## 2024-03-26 MED ORDER — PREDNISOLONE ACETATE 1 % OP SUSP
1.0000 [drp] | Freq: Two times a day (BID) | OPHTHALMIC | Status: DC
Start: 2024-03-30 — End: 2024-03-30
  Filled 2024-03-26: qty 5

## 2024-03-26 MED ORDER — SODIUM CHLORIDE 0.9 % IV SOLN: 250.0000 mL | INTRAVENOUS | Status: AC | PRN

## 2024-03-26 MED ORDER — ACETAMINOPHEN 325 MG PO TABS
650.0000 mg | ORAL_TABLET | Freq: Four times a day (QID) | ORAL | Status: DC | PRN
  Administered 2024-03-26: 650 mg via ORAL
  Filled 2024-03-26: qty 2

## 2024-03-26 MED ORDER — SODIUM CHLORIDE 0.9 % IV SOLN
100.0000 mg | Freq: Two times a day (BID) | INTRAVENOUS | Status: DC
  Administered 2024-03-26 – 2024-03-30 (×9): 100 mg via INTRAVENOUS
  Filled 2024-03-26 (×9): qty 100

## 2024-03-26 MED ORDER — LACTATED RINGERS IV SOLN: INTRAVENOUS | Status: DC

## 2024-03-26 MED ORDER — PROCHLORPERAZINE EDISYLATE 10 MG/2ML IJ SOLN
10.0000 mg | INTRAMUSCULAR | Status: DC | PRN
  Administered 2024-03-26 – 2024-03-27 (×2): 10 mg via INTRAVENOUS
  Filled 2024-03-26 (×2): qty 2

## 2024-03-26 MED ORDER — ONDANSETRON HCL 4 MG PO TABS
4.0000 mg | ORAL_TABLET | Freq: Four times a day (QID) | ORAL | Status: DC | PRN
Start: 2024-03-26 — End: 2024-03-30
  Administered 2024-03-29: 4 mg via ORAL
  Filled 2024-03-26: qty 1

## 2024-03-26 MED ORDER — ERYTHROMYCIN 5 MG/GM OP OINT
TOPICAL_OINTMENT | Freq: Every day | OPHTHALMIC | Status: DC
  Administered 2024-03-26 – 2024-03-29 (×4): 1 via OPHTHALMIC
  Filled 2024-03-26: qty 1

## 2024-03-26 MED ORDER — PREDNISOLONE ACETATE 1 % OP SUSP
1.0000 [drp] | Freq: Three times a day (TID) | OPHTHALMIC | Status: AC
  Administered 2024-03-26 – 2024-03-29 (×11): 1 [drp] via OPHTHALMIC
  Filled 2024-03-26: qty 5

## 2024-03-26 MED ORDER — MORPHINE SULFATE (PF) 4 MG/ML IV SOLN
INTRAVENOUS | Status: AC
  Filled 2024-03-26: qty 1

## 2024-03-26 MED ORDER — MORPHINE SULFATE (PF) 4 MG/ML IV SOLN
3.0000 mg | Freq: Once | INTRAVENOUS | Status: AC
  Administered 2024-03-26: 3 mg via INTRAVENOUS

## 2024-03-26 MED ORDER — PREDNISOLONE ACETATE 1 % OP SUSP: 1.0000 [drp] | OPHTHALMIC | Status: DC

## 2024-03-26 MED ORDER — SODIUM CHLORIDE 0.9% FLUSH
3.0000 mL | Freq: Two times a day (BID) | INTRAVENOUS | Status: DC
  Administered 2024-03-27 – 2024-03-29 (×5): 3 mL via INTRAVENOUS

## 2024-03-26 MED ORDER — AMLODIPINE BESYLATE 10 MG PO TABS: 10.0000 mg | ORAL_TABLET | Freq: Every day | ORAL | Status: DC

## 2024-03-26 MED ORDER — PREDNISOLONE ACETATE 1 % OP SUSP
1.0000 [drp] | Freq: Every day | OPHTHALMIC | Status: DC
  Filled 2024-03-26: qty 5

## 2024-03-26 MED ORDER — CHLORHEXIDINE GLUCONATE CLOTH 2 % EX PADS
6.0000 | MEDICATED_PAD | Freq: Every day | CUTANEOUS | Status: DC
  Administered 2024-03-27 – 2024-03-29 (×3): 6 via TOPICAL

## 2024-03-26 MED ORDER — ONDANSETRON HCL 4 MG/2ML IJ SOLN
4.0000 mg | Freq: Four times a day (QID) | INTRAMUSCULAR | Status: DC | PRN
  Administered 2024-03-26 – 2024-03-28 (×4): 4 mg via INTRAVENOUS
  Filled 2024-03-26 (×4): qty 2

## 2024-03-26 MED ORDER — ACETAMINOPHEN 650 MG RE SUPP: 650.0000 mg | Freq: Four times a day (QID) | RECTAL | Status: DC | PRN

## 2024-03-26 MED ORDER — LABETALOL HCL 200 MG PO TABS: 100.0000 mg | ORAL_TABLET | Freq: Two times a day (BID) | ORAL | Status: DC

## 2024-03-26 MED ORDER — CHLORHEXIDINE GLUCONATE CLOTH 2 % EX PADS
6.0000 | MEDICATED_PAD | Freq: Every day | CUTANEOUS | Status: DC
  Administered 2024-03-26: 6 via TOPICAL

## 2024-03-26 MED ORDER — TECHNETIUM TC 99M MEBROFENIN IV KIT
6.0000 | PACK | Freq: Once | INTRAVENOUS | Status: AC | PRN
  Administered 2024-03-26: 6 via INTRAVENOUS

## 2024-03-26 MED ORDER — DEXTROSE 5 % IV SOLN: INTRAVENOUS | Status: AC

## 2024-03-26 MED ORDER — PIPERACILLIN-TAZOBACTAM IN DEX 2-0.25 GM/50ML IV SOLN
2.2500 g | Freq: Three times a day (TID) | INTRAVENOUS | Status: DC
  Administered 2024-03-26 – 2024-03-30 (×12): 2.25 g via INTRAVENOUS
  Filled 2024-03-26 (×14): qty 50

## 2024-03-26 MED ORDER — ATROPINE SULFATE 1 % OP SOLN
1.0000 [drp] | Freq: Every day | OPHTHALMIC | Status: DC
  Administered 2024-03-26 – 2024-03-30 (×5): 1 [drp] via OPHTHALMIC
  Filled 2024-03-26 (×2): qty 2

## 2024-03-26 MED ORDER — HYDROMORPHONE HCL 1 MG/ML IJ SOLN
1.0000 mg | INTRAMUSCULAR | Status: DC | PRN
  Administered 2024-03-26 – 2024-03-27 (×2): 1 mg via INTRAVENOUS
  Filled 2024-03-26 (×2): qty 1

## 2024-03-26 MED ORDER — DEXTROSE IN LACTATED RINGERS 5 % IV SOLN: INTRAVENOUS | Status: DC

## 2024-03-26 MED ORDER — SODIUM CHLORIDE 0.9% FLUSH: 3.0000 mL | INTRAVENOUS | Status: DC | PRN

## 2024-03-26 NOTE — Plan of Care (Signed)
  Problem: Education: Goal: Knowledge of General Education information will improve Description: Including pain rating scale, medication(s)/side effects and non-pharmacologic comfort measures Outcome: Progressing   Problem: Clinical Measurements: Goal: Ability to maintain clinical measurements within normal limits will improve Outcome: Progressing Goal: Diagnostic test results will improve Outcome: Progressing Goal: Respiratory complications will improve Outcome: Progressing Goal: Cardiovascular complication will be avoided Outcome: Progressing   Problem: Activity: Goal: Risk for activity intolerance will decrease Outcome: Progressing   Problem: Nutrition: Goal: Adequate nutrition will be maintained Outcome: Progressing   Problem: Coping: Goal: Level of anxiety will decrease Outcome: Progressing   Problem: Elimination: Goal: Will not experience complications related to bowel motility Outcome: Progressing Goal: Will not experience complications related to urinary retention Outcome: Progressing   Problem: Pain Managment: Goal: General experience of comfort will improve and/or be controlled Outcome: Progressing   Problem: Safety: Goal: Ability to remain free from injury will improve Outcome: Progressing   Problem: Skin Integrity: Goal: Risk for impaired skin integrity will decrease Outcome: Progressing   Problem: Activity: Goal: Ability to tolerate increased activity will improve Outcome: Progressing   Problem: Clinical Measurements: Goal: Ability to maintain a body temperature in the normal range will improve Outcome: Progressing   Problem: Respiratory: Goal: Ability to maintain adequate ventilation will improve Outcome: Progressing Goal: Ability to maintain a clear airway will improve Outcome: Progressing

## 2024-03-26 NOTE — Plan of Care (Signed)
  Problem: Education: Goal: Knowledge of General Education information will improve Description: Including pain rating scale, medication(s)/side effects and non-pharmacologic comfort measures Outcome: Progressing   Problem: Clinical Measurements: Goal: Ability to maintain clinical measurements within normal limits will improve Outcome: Progressing Goal: Diagnostic test results will improve Outcome: Progressing Goal: Respiratory complications will improve Outcome: Progressing Goal: Cardiovascular complication will be avoided Outcome: Progressing   Problem: Activity: Goal: Risk for activity intolerance will decrease Outcome: Progressing   Problem: Nutrition: Goal: Adequate nutrition will be maintained Outcome: Progressing   Problem: Coping: Goal: Level of anxiety will decrease Outcome: Progressing   Problem: Elimination: Goal: Will not experience complications related to bowel motility Outcome: Progressing Goal: Will not experience complications related to urinary retention Outcome: Progressing   Problem: Pain Managment: Goal: General experience of comfort will improve and/or be controlled Outcome: Progressing   Problem: Safety: Goal: Ability to remain free from injury will improve Outcome: Progressing   Problem: Skin Integrity: Goal: Risk for impaired skin integrity will decrease Outcome: Progressing   Problem: Activity: Goal: Ability to tolerate increased activity will improve Outcome: Progressing   Problem: Clinical Measurements: Goal: Ability to maintain a body temperature in the normal range will improve Outcome: Progressing   Problem: Respiratory: Goal: Ability to maintain adequate ventilation will improve Outcome: Progressing Goal: Ability to maintain a clear airway will improve Outcome: Progressing   Problem: Education: Goal: Ability to describe self-care measures that may prevent or decrease complications (Diabetes Survival Skills Education) will  improve Outcome: Progressing   Problem: Coping: Goal: Ability to adjust to condition or change in health will improve Outcome: Progressing   Problem: Fluid Volume: Goal: Ability to maintain a balanced intake and output will improve Outcome: Progressing   Problem: Nutritional: Goal: Maintenance of adequate nutrition will improve Outcome: Progressing   Problem: Skin Integrity: Goal: Risk for impaired skin integrity will decrease Outcome: Progressing

## 2024-03-26 NOTE — Consult Note (Signed)
 John [name] [birth year]  [id number].    Requesting MD: Subrina Sundil, MD Chief Complaint/Reason for Consult: possible cholecystitis  HPI:  John [name] is a 33 yo male with a history of T1DM, ESRD on HD, HTN, and cardiomyopathy who presented to the ED from dialysis yesterday with weakness and fatigue. He reports having intermittent upper abdominal pain for the last few days. It usually occurs with vomiting. He also endorses diarrhea. His girlfriend reports that at the end of the dialysis session yesterday, he seemed disoriented. In the ED, labs showed an anion gap acidosis concerning for DKA, and he was admitted and started on an insulin  gtt. A CT scan showed possible pneumonia, for which he was started on antibiotics. He was also noted to have nonspecific pericholecystic fluid. A RUQ US  was then done, which showed gallbladder wall thickening and pericholecystitis fluid but no gallstones. Today he continues to have vomiting and diarrhea. He reports his abdominal pain is intermittent, but is primarily in the epigastric area and RUQ, with radiation to the back.  His only prior abdominal surgeries were for PD catheter placement.  ROS: Review of Systems  Constitutional:  Negative for chills and fever.  Respiratory:  Negative for shortness of breath.   Gastrointestinal:  Positive for abdominal pain, diarrhea, nausea and vomiting.    Family History  Problem Relation Age of Onset   Lupus Cousin    Cancer Neg Hx     Past Medical History:  Diagnosis Date   ADD (attention deficit disorder)    Allergic rhinitis    Anemia    hx ckd   Asthma    patient denies this dx, no inhalers   Chronic kidney disease    ESRD on dialysis TTHS   Complication of anesthesia    N/V   Diabetic ketoacidosis without coma associated with type 1 diabetes mellitus (HCC)    DKA (diabetic ketoacidoses)    Family history of adverse reaction to anesthesia    mother - nausea   Goiter, unspecified 02/04/2011    Headache    with elevate blood pressure   Hypertension    IDA (iron deficiency anemia)    Pneumonia    Possiblly 10/25/22-  he was told, from chest X-ray   PONV (postoperative nausea and vomiting)    Type I (juvenile type) diabetes mellitus without mention of complication, uncontrolled 02/04/2011   On Insulin     Past Surgical History:  Procedure Laterality Date   A/V FISTULAGRAM N/A 10/06/2023   Procedure: A/V Fistulagram;  Surgeon: Melia Lynwood ORN, MD;  Location: [facility] INVASIVE CV LAB;  Service: Cardiovascular;  Laterality: N/A;   AV FISTULA PLACEMENT Left 10/25/2022   Procedure: CEPHALIC ARTERIOVENOUS (AV) FISTULA CREATION;  Surgeon: Sheree Penne Bruckner, MD;  Location: [facility] OR;  Service: Vascular;  Laterality: Left;   CAPD INSERTION N/A 01/17/2023   Procedure: LAPAROSCOPIC INSERTION CONTINUOUS AMBULATORY PERITONEAL DIALYSIS  (CAPD) CATHETER;  Surgeon: Sheree Penne Bruckner, MD;  Location: [facility] OR;  Service: Vascular;  Laterality: N/A;   CAPD REMOVAL N/A 05/12/2023   Procedure: REMOVAL CONTINUOUS AMBULATORY PERITONEAL DIALYSIS  (CAPD) CATHETER;  Surgeon: Sheree Penne Bruckner, MD;  Location: [facility] OR;  Service: Vascular;  Laterality: N/A;   EYE SURGERY Right 2024   INSERTION OF DIALYSIS CATHETER Right 10/25/2022   Procedure: INSERTION OF TUNNELED PALINDROME 14.5 FR X 19 CM DIALYSIS CATHETER;  Surgeon: Sheree Penne Bruckner, MD;  Location: [facility] OR;  Service: Vascular;  Laterality: Right;   PERIPHERAL VASCULAR BALLOON ANGIOPLASTY Left 10/06/2023  Procedure: PERIPHERAL VASCULAR BALLOON ANGIOPLASTY;  Surgeon: Melia Lynwood ORN, MD;  Location: [facility] INVASIVE CV LAB;  Service: Cardiovascular;  Laterality: Left;  cephalic arch   TONSILLECTOMY      Social History:  reports that he quit smoking about 15 months ago. His smoking use included cigarettes. He quit smokeless tobacco use about 10 years ago. He reports that he does not currently use alcohol. He reports current drug use. Frequency: 7.00 times per  week. Drug: Marijuana.  Allergies: No Known Allergies  Medications Prior to Admission  Medication Sig Dispense Refill   acetaminophen  (TYLENOL ) 500 MG tablet Take 1,000 mg by mouth every 6 (six) hours as needed for mild pain or moderate pain.     amLODipine  (NORVASC ) 10 MG tablet Take 1 tablet (10 mg total) by mouth daily. 30 tablet 1   atropine  1 % ophthalmic solution Place 1 drop into the left eye daily.     bismuth subsalicylate (PEPTO BISMOL) 262 MG chewable tablet Chew 524 mg by mouth daily as needed for indigestion or diarrhea or loose stools.     Calcium  Carbonate Antacid (TUMS PO) Take 1 tablet by mouth 2 (two) times daily as needed (heartburn).     erythromycin  ophthalmic ointment SMARTSIG:Left Eye Every Evening     Glucagon (BAQSIMI ONE PACK) 3 MG/DOSE POWD Place 1 spray into the nose as needed (hypoglycemic episode).     glucose blood (ONE TOUCH ULTRA TEST) test strip Use as instructed 3x daily 100 each 12   Insulin  Glargine (BASAGLAR  KWIKPEN) 100 UNIT/ML Inject 18 Units into the skin at bedtime.     labetalol  (NORMODYNE ) 100 MG tablet Take 1 tablet (100 mg total) by mouth 2 (two) times daily. 60 tablet 1   Melatonin 10 MG CHEW Chew 10-20 mg by mouth at bedtime as needed (Sleep).     Multiple Vitamins-Minerals (PRESERVISION AREDS 2 PO) Take 1 tablet by mouth in the morning.     NOVOLOG  FLEXPEN 100 UNIT/ML FlexPen Inject 3-6 Units into the skin 3 (three) times daily with meals.     prednisoLONE  acetate (PRED FORTE ) 1 % ophthalmic suspension Apply 1 drop to eye See admin instructions.  Place 1 drop into the left eye as directed for 28 days 4x/day for 1 wk, then 3x/day for 1 wk, then 2x/day for 1 wk, then 1x/day for 1 wk, then STOP     sevelamer carbonate (RENVELA) 800 MG tablet Take 2,400 mg by mouth See admin instructions. Take 3 tablets (2400 mg) by mouth with each meal & with each snack     simethicone  (MYLICON) 80 MG chewable tablet Chew 80 mg by mouth every 6 (six) hours as needed  for flatulence.       Physical Exam: Blood pressure (!) 180/106, pulse 100, temperature 98.2 F (36.8 C), temperature source Oral, resp. rate 15, height 5' 10 (1.778 m), weight 88.1 kg, SpO2 90%. General: resting comfortably, appears stated age, no apparent distress Neurological: alert and oriented, no focal deficits, cranial nerves grossly in tact HEENT: normocephalic, atraumatic CV: regular rate and rhythm Respiratory: nonlabored respirations Abdomen: soft, nondistended, nontender to palpation, specifically no RUQ tenderness to palpation. Multiple well-healed surgical scars on the left side of the abdomen. Extremities: warm and well-perfused, no deformities, moving all extremities spontaneously Skin: warm and dry, no jaundice, no rashes or lesions   Results for orders placed or performed during the hospital encounter of 03/25/24 (from the past 48 hours)  CBG monitoring, ED     Status:  Abnormal   Collection Time: 03/25/24  4:54 PM  Result Value Ref Range   Glucose-Capillary 396 (H) 70 - 99 mg/dL    Comment: Glucose reference range applies only to samples taken after fasting for at least 8 hours.  I-Stat venous blood gas, ([facility] ED, [facility], [facility])     Status: Abnormal   Collection Time: 03/25/24  6:06 PM  Result Value Ref Range   pH, Ven 7.395 7.25 - 7.43   pCO2, Ven 32.0 (L) 44 - 60 mmHg   pO2, Ven 56 (H) 32 - 45 mmHg   Bicarbonate 19.6 (L) 20.0 - 28.0 mmol/L   TCO2 21 (L) 22 - 32 mmol/L   O2 Saturation 89 %   Acid-base deficit 4.0 (H) 0.0 - 2.0 mmol/L   Sodium 134 (L) 135 - 145 mmol/L   Potassium 4.5 3.5 - 5.1 mmol/L   Calcium , Ion 0.98 (L) 1.15 - 1.40 mmol/L   HCT 50.0 39.0 - 52.0 %   Hemoglobin 17.0 13.0 - 17.0 g/dL   Collection site IV start    Drawn by Nurse    Sample type VENOUS   Comprehensive metabolic panel     Status: Abnormal   Collection Time: 03/25/24  6:20 PM  Result Value Ref Range   Sodium 139 135 - 145 mmol/L    Comment: Electrolytes repeated to confirm.    Potassium 4.9 3.5 - 5.1 mmol/L   Chloride 86 (L) 98 - 111 mmol/L   CO2 17 (L) 22 - 32 mmol/L   Glucose, Bld 439 (H) 70 - 99 mg/dL    Comment: Glucose reference range applies only to samples taken after fasting for at least 8 hours.   BUN 56 (H) 6 - 20 mg/dL   Creatinine, Ser 3.85 (H) 0.61 - 1.24 mg/dL   Calcium  8.7 (L) 8.9 - 10.3 mg/dL   Total Protein 6.5 6.5 - 8.1 g/dL   Albumin  4.0 3.5 - 5.0 g/dL   AST 54 (H) 15 - 41 U/L   ALT 46 (H) 0 - 44 U/L   Alkaline Phosphatase 136 (H) 38 - 126 U/L   Total Bilirubin 0.8 0.0 - 1.2 mg/dL   GFR, Estimated 12 (L) >60 mL/min    Comment: (NOTE) Calculated using the CKD-EPI Creatinine Equation (2021)    Anion gap 36 (H) 5 - 15    Comment: Performed at [facility], [address], [place], [place] [address]  CBC     Status: Abnormal   Collection Time: 03/25/24  6:20 PM  Result Value Ref Range   WBC 15.0 (H) 4.0 - 10.5 K/uL   RBC 2.57 (L) 4.22 - 5.81 MIL/uL   Hemoglobin 8.1 (L) 13.0 - 17.0 g/dL   HCT 75.7 (L) 60.9 - 47.9 %   MCV 94.2 80.0 - 100.0 fL   MCH 31.5 26.0 - 34.0 pg   MCHC 33.5 30.0 - 36.0 g/dL   RDW 85.4 88.4 - 84.4 %   Platelets 295 150 - 400 K/uL   nRBC 0.0 0.0 - 0.2 %    Comment: Performed at [facility], [address] [place], [place] [address]  Magnesium     Status: None   Collection Time: 03/25/24  6:20 PM  Result Value Ref Range   Magnesium 2.4 1.7 - 2.4 mg/dL    Comment: Performed at [facility], [address], [place], [place] [address]  Beta-hydroxybutyric acid     Status: Abnormal   Collection Time: 03/25/24  6:20 PM  Result Value Ref Range   Beta-Hydroxybutyric Acid >8.00 (H) 0.05 - 0.27 mmol/L    Comment: RESULT CONFIRMED BY MANUAL DILUTION Performed at [facility], [address] [place], [place] [address]   Ethanol     Status: None   Collection Time: 03/25/24  6:20 PM  Result Value Ref Range   Alcohol, Ethyl (B) <15 <15 mg/dL    Comment:  (NOTE) For medical purposes only. Performed at [facility], [address], [place], [place] [address]   Lactic acid, plasma     Status: Abnormal   Collection Time: 03/25/24  6:20 PM  Result Value Ref Range   Lactic Acid, Venous 2.7 (HH) 0.5 - 1.9 mmol/L    Comment: Critical Value, Read Back and verified with C. Young 19:07 03/25/24 by JINNY. Falicki Performed at [facility], [address] [place], [place] [address]   Lactic acid, plasma     Status: Abnormal   Collection Time: 03/25/24  8:14 PM  Result Value Ref Range   Lactic Acid, Venous 3.4 (HH) 0.5 - 1.9 mmol/L    Comment: Critical Value, Read Back and verified with DUWAINE SAUNDERS., RN 2102 AD Performed at [facility], [address], [place], [place] [address]   Troponin T, High Sensitivity     Status: Abnormal   Collection Time: 03/25/24  8:14 PM  Result Value Ref Range   Troponin T High Sensitivity 514 (HH) <19 ng/L    Comment: Critical Value, Read Back and verified with MEGAN R., RN 2102 AD (NOTE) Biotin concentrations > 1000 ng/mL falsely decrease TnT results.  Serial cardiac troponin measurements are suggested.  Refer to the Links section for chest pain algorithms and additional  guidance. Performed at [facility], [address], [place], [place] [address]   Resp panel by RT-PCR (RSV, Flu A&B, Covid) Anterior Nasal Swab     Status: None   Collection Time: 03/25/24  8:14 PM   Specimen: Anterior Nasal Swab  Result Value Ref Range   SARS Coronavirus 2 by RT PCR NEGATIVE NEGATIVE    Comment: (NOTE) SARS-CoV-2 target nucleic acids are NOT DETECTED.  The SARS-CoV-2 RNA is generally detectable in upper respiratory specimens during the acute phase of infection. The lowest concentration of SARS-CoV-2 viral copies this assay can detect is 138 copies/mL. A negative result does not preclude SARS-Cov-2 infection and should not be used as the sole basis  for treatment or other patient management decisions. A negative result may occur with  improper specimen collection/handling, submission of specimen other than nasopharyngeal swab, presence of viral mutation(s) within the areas targeted by this assay, and inadequate number of viral copies(<138 copies/mL). A negative result must be combined with clinical observations, patient history, and epidemiological information. The expected result is Negative.  Fact Sheet for Patients:  BloggerCourse.com  Fact Sheet for Healthcare Providers:  SeriousBroker.it  This test is no t yet approved or cleared by the [place]  FDA and  has been authorized for detection and/or diagnosis of SARS-CoV-2 by FDA under an Emergency Use Authorization (EUA). This EUA will remain  in effect (meaning this test can be used) for the duration of the COVID-19 declaration under Section 564(b)(1) of the Act, 21 U.S.C.section 360bbb-3(b)(1), unless the authorization is terminated  or revoked sooner.       Influenza A by PCR NEGATIVE NEGATIVE   Influenza B by PCR NEGATIVE NEGATIVE    Comment: (NOTE) The Xpert Xpress SARS-CoV-2/FLU/RSV plus assay is intended  as an aid in the diagnosis of influenza from Nasopharyngeal swab specimens and should not be used as a sole basis for treatment. Nasal washings and aspirates are unacceptable for Xpert Xpress SARS-CoV-2/FLU/RSV testing.  Fact Sheet for Patients: BloggerCourse.com  Fact Sheet for Healthcare Providers: SeriousBroker.it  This test is not yet approved or cleared by the [place]  FDA and has been authorized for detection and/or diagnosis of SARS-CoV-2 by FDA under an Emergency Use Authorization (EUA). This EUA will remain in effect (meaning this test can be used) for the duration of the COVID-19 declaration under Section 564(b)(1) of the Act, 21  U.S.C. section 360bbb-3(b)(1), unless the authorization is terminated or revoked.     Resp Syncytial Virus by PCR NEGATIVE NEGATIVE    Comment: (NOTE) Fact Sheet for Patients: BloggerCourse.com  Fact Sheet for Healthcare Providers: SeriousBroker.it  This test is not yet approved or cleared by the [place]  FDA and has been authorized for detection and/or diagnosis of SARS-CoV-2 by FDA under an Emergency Use Authorization (EUA). This EUA will remain in effect (meaning this test can be used) for the duration of the COVID-19 declaration under Section 564(b)(1) of the Act, 21 U.S.C. section 360bbb-3(b)(1), unless the authorization is terminated or revoked.  Performed at [facility], [address] [place], [place] [address]   CBG monitoring, ED     Status: Abnormal   Collection Time: 03/25/24  9:20 PM  Result Value Ref Range   Glucose-Capillary 479 (H) 70 - 99 mg/dL    Comment: Glucose reference range applies only to samples taken after fasting for at least 8 hours.  Troponin T, High Sensitivity     Status: Abnormal   Collection Time: 03/25/24 10:14 PM  Result Value Ref Range   Troponin T High Sensitivity 453 (HH) <19 ng/L    Comment: Critical value noted. Value is consistent with previously reported and called value  (NOTE) Biotin concentrations > 1000 ng/mL falsely decrease TnT results.  Serial cardiac troponin measurements are suggested.  Refer to the Links section for chest pain algorithms and additional  guidance. Performed at [facility], [address] [place], [place] [address]   CBG monitoring, ED     Status: Abnormal   Collection Time: 03/25/24 11:23 PM  Result Value Ref Range   Glucose-Capillary 549 (HH) 70 - 99 mg/dL    Comment: Glucose reference range applies only to samples taken after fasting for at least 8 hours.   Comment 1 /   CBG monitoring, ED     Status:  Abnormal   Collection Time: 03/26/24 12:04 AM  Result Value Ref Range   Glucose-Capillary 485 (H) 70 - 99 mg/dL    Comment: Glucose reference range applies only to samples taken after fasting for at least 8 hours.  CBG monitoring, ED     Status: Abnormal   Collection Time: 03/26/24 12:42 AM  Result Value Ref Range   Glucose-Capillary 482 (H) 70 - 99 mg/dL    Comment: Glucose reference range applies only to samples taken after fasting for at least 8 hours.  CBG monitoring, ED     Status: Abnormal   Collection Time: 03/26/24  1:18 AM  Result Value Ref Range   Glucose-Capillary 459 (H) 70 - 99 mg/dL    Comment: Glucose reference range applies only to samples taken after fasting for at least 8 hours.  CBG monitoring, ED     Status: Abnormal   Collection Time: 03/26/24  1:55 AM  Result Value  Ref Range   Glucose-Capillary 414 (H) 70 - 99 mg/dL    Comment: Glucose reference range applies only to samples taken after fasting for at least 8 hours.  Glucose, capillary     Status: Abnormal   Collection Time: 03/26/24  2:55 AM  Result Value Ref Range   Glucose-Capillary 351 (H) 70 - 99 mg/dL    Comment: Glucose reference range applies only to samples taken after fasting for at least 8 hours.  Basic metabolic panel     Status: Abnormal   Collection Time: 03/26/24  3:55 AM  Result Value Ref Range   Sodium 137 135 - 145 mmol/L   Potassium 4.3 3.5 - 5.1 mmol/L   Chloride 93 (L) 98 - 111 mmol/L   CO2 22 22 - 32 mmol/L   Glucose, Bld 320 (H) 70 - 99 mg/dL    Comment: Glucose reference range applies only to samples taken after fasting for at least 8 hours.   BUN 82 (H) 6 - 20 mg/dL   Creatinine, Ser 2.57 (H) 0.61 - 1.24 mg/dL   Calcium  7.9 (L) 8.9 - 10.3 mg/dL   GFR, Estimated 9 (L) >60 mL/min    Comment: (NOTE) Calculated using the CKD-EPI Creatinine Equation (2021)    Anion gap 22 (H) 5 - 15    Comment: ELECTROLYTES REPEATED TO VERIFY Performed at [facility], [address]  [place], [place] [address]   Beta-hydroxybutyric acid     Status: Abnormal   Collection Time: 03/26/24  3:55 AM  Result Value Ref Range   Beta-Hydroxybutyric Acid 1.94 (H) 0.05 - 0.27 mmol/L    Comment: Performed at [facility], [address], [place], [place] [address]  Troponin I (High Sensitivity)     Status: Abnormal   Collection Time: 03/26/24  3:55 AM  Result Value Ref Range   Troponin I (High Sensitivity) 199 (HH) <18 ng/L    Comment: CRITICAL RESULT CALLED TO, READ BACK BY AND VERIFIED WITH PARICHAT CHOKTANAPORN RN.@[email or web address]  ON 8.5.25 BY TCALDWELL MT. (NOTE) Elevated high sensitivity troponin I (hsTnI) values and significant  changes across serial measurements may suggest ACS but many other  chronic and acute conditions are known to elevate hsTnI results.  Refer to the Links section for chest pain algorithms and additional  guidance. Performed at [facility], [address], [place], [place] [address]   HIV Antibody (routine testing w rflx)     Status: None   Collection Time: 03/26/24  3:55 AM  Result Value Ref Range   HIV Screen 4th Generation wRfx Non Reactive Non Reactive    Comment: Performed at [facility], [address] [place], [place] [address]  Procalcitonin     Status: None   Collection Time: 03/26/24  3:55 AM  Result Value Ref Range   Procalcitonin 17.00 ng/mL    Comment:        Interpretation: PCT >= 10 ng/mL: Important systemic inflammatory response, almost exclusively due to severe bacterial sepsis or septic shock. (NOTE)       Sepsis PCT Algorithm           Lower Respiratory Tract                                      Infection PCT Algorithm    ----------------------------     ----------------------------  PCT < 0.25 ng/mL                PCT < 0.10 ng/mL          Strongly encourage             Strongly discourage   discontinuation of antibiotics    initiation of antibiotics    ----------------------------      -----------------------------       PCT 0.25 - 0.50 ng/mL            PCT 0.10 - 0.25 ng/mL               OR       >80% decrease in PCT            Discourage initiation of                                            antibiotics      Encourage discontinuation           of antibiotics    ----------------------------     -----------------------------         PCT >= 0.50 ng/mL              PCT 0.26 - 0.50 ng/mL                AND       <80% decrease in PCT             Encourage initiation of                                             antibiotics       Encourage continuation           of antibiotics    ----------------------------     -----------------------------        PCT >= 0.50 ng/mL                  PCT > 0.50 ng/mL               AND         increase in PCT                  Strongly encourage                                      initiation of antibiotics    Strongly encourage escalation           of antibiotics                                     -----------------------------                                           PCT <= 0.25 ng/mL  OR                                        > 80% decrease in PCT                                      Discontinue / Do not initiate                                             antibiotics  Performed at [facility], [address], [place], [place] [address]   Protime-INR     Status: Abnormal   Collection Time: 03/26/24  3:55 AM  Result Value Ref Range   Prothrombin Time 18.7 (H) 11.4 - 15.2 seconds   INR 1.5 (H) 0.8 - 1.2    Comment: (NOTE) INR goal varies based on device and disease states. Performed at [facility], [address] [place], [place] [address]   Respiratory (~20 pathogens) panel by PCR     Status: None   Collection Time: 03/26/24  3:56 AM   Specimen: Nasopharyngeal Swab; Respiratory  Result Value Ref Range   Adenovirus NOT DETECTED NOT DETECTED   Coronavirus 229E  NOT DETECTED NOT DETECTED    Comment: (NOTE) The Coronavirus on the Respiratory Panel, DOES NOT test for the novel  Coronavirus (2019 nCoV)    Coronavirus HKU1 NOT DETECTED NOT DETECTED   Coronavirus NL63 NOT DETECTED NOT DETECTED   Coronavirus OC43 NOT DETECTED NOT DETECTED   Metapneumovirus NOT DETECTED NOT DETECTED   Rhinovirus / Enterovirus NOT DETECTED NOT DETECTED   Influenza A NOT DETECTED NOT DETECTED   Influenza B NOT DETECTED NOT DETECTED   Parainfluenza Virus 1 NOT DETECTED NOT DETECTED   Parainfluenza Virus 2 NOT DETECTED NOT DETECTED   Parainfluenza Virus 3 NOT DETECTED NOT DETECTED   Parainfluenza Virus 4 NOT DETECTED NOT DETECTED   Respiratory Syncytial Virus NOT DETECTED NOT DETECTED   Bordetella pertussis NOT DETECTED NOT DETECTED   Bordetella Parapertussis NOT DETECTED NOT DETECTED   Chlamydophila pneumoniae NOT DETECTED NOT DETECTED   Mycoplasma pneumoniae NOT DETECTED NOT DETECTED    Comment: Performed at [facility], [address], [place], [place] [address]  Glucose, capillary     Status: Abnormal   Collection Time: 03/26/24  4:02 AM  Result Value Ref Range   Glucose-Capillary 296 (H) 70 - 99 mg/dL    Comment: Glucose reference range applies only to samples taken after fasting for at least 8 hours.  Glucose, capillary     Status: Abnormal   Collection Time: 03/26/24  4:56 AM  Result Value Ref Range   Glucose-Capillary 235 (H) 70 - 99 mg/dL    Comment: Glucose reference range applies only to samples taken after fasting for at least 8 hours.  Glucose, capillary     Status: Abnormal   Collection Time: 03/26/24  5:57 AM  Result Value Ref Range   Glucose-Capillary 191 (H) 70 - 99 mg/dL    Comment: Glucose reference range applies only to samples taken after fasting for at least 8 hours.  Glucose, capillary     Status: Abnormal   Collection Time: 03/26/24  6:57  AM  Result Value Ref Range   Glucose-Capillary 147 (H) 70 - 99 mg/dL    Comment: Glucose  reference range applies only to samples taken after fasting for at least 8 hours.  Glucose, capillary     Status: Abnormal   Collection Time: 03/26/24  8:03 AM  Result Value Ref Range   Glucose-Capillary 135 (H) 70 - 99 mg/dL    Comment: Glucose reference range applies only to samples taken after fasting for at least 8 hours.  Glucose, capillary     Status: Abnormal   Collection Time: 03/26/24  9:03 AM  Result Value Ref Range   Glucose-Capillary 131 (H) 70 - 99 mg/dL    Comment: Glucose reference range applies only to samples taken after fasting for at least 8 hours.   US  Abdomen Limited RUQ (LIVER/GB) Result Date: 03/25/2024 EXAM: Right Upper Quadrant Abdominal Ultrasound 03/25/2024 10:25:00 PM TECHNIQUE: Real-time ultrasonography of the right upper quadrant of the abdomen was performed. COMPARISON: None available. CLINICAL HISTORY: Vomiting; Abnormal CT scan. ICD-10 codes: R11.0, R93.8. FINDINGS: LIVER: The liver demonstrates normal echogenicity. No intrahepatic biliary ductal dilatation. No evidence of mass. BILIARY SYSTEM: There is pericholecystic fluid and wall thickening of the gallbladder. No cholelithiasis. Negative sonographic Murphy's sign. Common bile duct is mildly dilated measuring 7 mm. RIGHT KIDNEY: The right kidney is grossly unremarkable in appearances without evidence of hydronephrosis, echogenic calculi or worrisome mass lesions. OTHER: No right upper quadrant ascites. IMPRESSION: 1. Gallbladder wall thickening and pericholecystic fluid without cholelithiasis or sonographic Murphy's sign. Findings are suggestive but not definitive for acute cholecystitis 2. Mildly dilated common bile duct measuring 7 mm. Electronically signed by: Norman Gatlin MD 03/25/2024 11:13 PM EDT RP Workstation: [id number]   CT CHEST ABDOMEN PELVIS W CONTRAST Result Date: 03/25/2024 CLINICAL DATA:  Sepsis concern for sepsis, abnl CXR, vomiting and abdominal pain POV from dialysis, per girlfriend pt appeared  to seem to be confused during dialysis so they advised he come to ED. EXAM: CT CHEST, ABDOMEN, AND PELVIS WITH CONTRAST TECHNIQUE: Multidetector CT imaging of the chest, abdomen and pelvis was performed following the standard protocol during bolus administration of intravenous contrast. RADIATION DOSE REDUCTION: This exam was performed according to the departmental dose-optimization program which includes automated exposure control, adjustment of the mA and/or kV according to patient size and/or use of iterative reconstruction technique. CONTRAST:  80mL OMNIPAQUE  IOHEXOL  300 MG/ML  SOLN COMPARISON:  CT abdomen pelvis 10/21/2022, CT renal 03/16/2015 FINDINGS: CT CHEST FINDINGS Cardiovascular: Normal heart size. No significant pericardial effusion. The thoracic aorta is normal in caliber. No atherosclerotic plaque of the thoracic aorta. No coronary artery calcifications. Mediastinum/Nodes: No enlarged mediastinal, hilar, or axillary lymph nodes. Thyroid  gland, trachea, and esophagus demonstrate no significant findings. Lungs/Pleura: Right lower lobe passive atelectasis. Right upper lobe peribronchovascular patchy ground-glass and centrally consolidative airspace opacities. T0 a smaller extent similar finding at the left anterior apex, bilateral lower lobes and right middle lobe. No pulmonary mass. Trace to small volume right pleural effusion. No pneumothorax. Musculoskeletal: Bilateral gynecomastia, right slightly greater than left. No suspicious lytic or blastic osseous lesions. No acute displaced fracture. CT ABDOMEN PELVIS FINDINGS Hepatobiliary: No focal liver abnormality. No gallstones, gallbladder wall thickening. Trace pericholecystic fluid. No biliary dilatation. Pancreas: No focal lesion. Normal pancreatic contour. No surrounding inflammatory changes. No main pancreatic ductal dilatation. Spleen: Normal in size without focal abnormality. Adrenals/Urinary Tract: No adrenal nodule bilaterally. Bilateral kidneys  enhance symmetrically. No hydronephrosis. No hydroureter. The urinary bladder is unremarkable. Stomach/Bowel:  Stomach is within normal limits. No evidence of bowel wall thickening or dilatation. No pneumatosis appendix appears normal. 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 Abdominal aorta or iliac aneurysm. No abdominal, pelvic, or inguinal lymphadenopathy. Reproductive: Prostate is unremarkable. Other: No intraperitoneal free fluid. No intraperitoneal free gas. No organized fluid collection. Musculoskeletal: No abdominal wall hernia or abnormality. No suspicious lytic or blastic osseous lesions. No acute displaced fracture. IMPRESSION: 1. Right upper lobe peribronchovascular patchy ground-glass and centrally consolidative airspace opacities. To a smaller extent similar findings at the left anterior apex, bilateral lower lobes and right middle lobe. Finding could represent infection/inflammation with alveolar hemorrhage not excluded. 2.  Trace to small volume right pleural effusion. 3. Nonspecific pericholecystic free fluid. Correlate with liver function tests and consider right upper quadrant ultrasound if clinically indicated. 4. 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 5. Bilateral gynecomastia, right slightly greater than left. Recommend correlation with physical exam. Electronically Signed   By: Morgane  Naveau M.D.   On: 03/25/2024 20:57   DG Chest 2 View Result Date: 03/25/2024 CLINICAL DATA:  Altered level of consciousness, end-stage renal disease. Fatigue. Vomiting. EXAM: CHEST - 2 VIEW COMPARISON:  10/25/2022 FINDINGS: Chronic cardiomegaly. Vascular congestion. Additional patchy opacity in the right suprahilar lung. Small left pleural effusion. No pneumothorax. No  acute osseous findings. IMPRESSION: 1. Chronic cardiomegaly with vascular congestion. 2. Patchy opacity in the right suprahilar lung, may be atelectasis or pneumonia. 3. Small left pleural effusion. Electronically Signed   By: Andrea Gasman M.D.   On: 03/25/2024 18:09   CT Head Wo Contrast Result Date: 03/25/2024 CLINICAL DATA:  Mental status change, unknown cause ALOC, ESRD EXAM: CT HEAD WITHOUT CONTRAST TECHNIQUE: Contiguous axial images were obtained from the base of the skull through the vertex without intravenous contrast. RADIATION DOSE REDUCTION: This exam was performed according to the departmental dose-optimization program which includes automated exposure control, adjustment of the mA and/or kV according to patient size and/or use of iterative reconstruction technique. COMPARISON:  Remote CT 06/16/2009 FINDINGS: Brain: Segmental imaging due to motion. No intracranial hemorrhage, mass effect, or midline shift. No hydrocephalus. The basilar cisterns are patent. No evidence of territorial infarct or acute ischemia. No extra-axial or intracranial fluid collection. Vascular: Atherosclerosis of skullbase vasculature without hyperdense vessel or abnormal calcification. Skull: No fracture or focal lesion. Sinuses/Orbits: Diffuse hyperdensity in the left lobe. Right cataract resection. Other: None. IMPRESSION: 1. No acute intracranial abnormality. 2. Diffuse hyperdensity in the left globe, may be related to prior surgery or trauma. Recommend correlation with clinical history. Electronically Signed   By: Andrea Gasman M.D.   On: 03/25/2024 18:07      Assessment/Plan 33 yo male with T1DM and ESRD on HD, admitted with nausea/vomiting, diarrhea, weakness and DKA, as well as concern for pneumonia and cholecystitis. He does not have cholelithiasis, and his imaging findings are nonspecific. He is not tender in the RUQ on exam, but does endorse intermittent RUQ and epigastric pain. Will proceed with a HIDA  scan to further evaluate for cholecystitis. If this is consistent with cholecystitis, would not perform a cholecystectomy until DKA has resolved. I discussed this plan with the patient and his significant other and they are agreeable to this. All questions were answered.   Leonor Dawn, MD [facility], Hepatobiliary and Pancreatic Surgery 03/26/24 9:46 AM

## 2024-03-26 NOTE — Consult Note (Signed)
 Renal Service Consult Note [facility]  John [name] 03/26/2024 John JONETTA Fret, MD Requesting Physician: Dr. Danton  Reason for Consult: ESRD patient with generalized weakness and probable DKA HPI: The patient is a 33 y.o. year-old w/ PMH as below who presented to ED yesterday complaining of fatigue for a few days, loss of appetite and multiple episodes of nausea and vomiting.  Is on dialysis TTS schedule, hypertension, diabetes type 1.  He had dialysis yesterday and at the end of the session he became confused and started vomiting multiple times.  The significant other denied any recent fevers or chills, no abdominal pain or GI bleeding.  In the ED at [facility], BP 167/ 63, HR 111, RR 16-23, temp 98.2. Labs showed high anion gap with acidosis and BHB greater than 8.  K+ 4.9. IV insulin  infusion was started.  CT of chest and abdomen showed possible pneumonia and IV antibiotics were started.  Patient was transferred to [facility].  We are asked to see for dialysis   Pt seen in hospital room.  No complaints at this time.   ROS - denies CP, no joint pain, no HA, no blurry vision, no rash, no diarrhea, no nausea/ vomiting   Past Medical History  Past Medical History:  Diagnosis Date   ADD (attention deficit disorder)    Allergic rhinitis    Anemia    hx ckd   Asthma    patient denies this dx, no inhalers   Chronic kidney disease    ESRD on dialysis TTHS   Complication of anesthesia    N/V   Diabetic ketoacidosis without coma associated with type 1 diabetes mellitus (HCC)    DKA (diabetic ketoacidoses)    Family history of adverse reaction to anesthesia    mother - nausea   Goiter, unspecified 02/04/2011   Headache    with elevate blood pressure   Hypertension    IDA (iron deficiency anemia)    Pneumonia    Possiblly 10/25/22-  he was told, from chest X-ray   PONV (postoperative nausea and vomiting)    Type I (juvenile type) diabetes mellitus without  mention of complication, uncontrolled 02/04/2011   On Insulin    Past Surgical History  Past Surgical History:  Procedure Laterality Date   A/V FISTULAGRAM N/A 10/06/2023   Procedure: A/V Fistulagram;  Surgeon: Melia Lynwood ORN, MD;  Location: [facility];  Service: Cardiovascular;  Laterality: N/A;   AV FISTULA PLACEMENT Left 10/25/2022   Procedure: CEPHALIC ARTERIOVENOUS (AV) FISTULA CREATION;  Surgeon: Sheree Penne Bruckner, MD;  Location: [facility] OR;  Service: Vascular;  Laterality: Left;   CAPD INSERTION N/A 01/17/2023   Procedure: LAPAROSCOPIC INSERTION CONTINUOUS AMBULATORY PERITONEAL DIALYSIS  (CAPD) CATHETER;  Surgeon: Sheree Penne Bruckner, MD;  Location: [facility] OR;  Service: Vascular;  Laterality: N/A;   CAPD REMOVAL N/A 05/12/2023   Procedure: REMOVAL CONTINUOUS AMBULATORY PERITONEAL DIALYSIS  (CAPD) CATHETER;  Surgeon: Sheree Penne Bruckner, MD;  Location: [facility] OR;  Service: Vascular;  Laterality: N/A;   EYE SURGERY Right 2024   INSERTION OF DIALYSIS CATHETER Right 10/25/2022   Procedure: INSERTION OF TUNNELED PALINDROME 14.5 FR X 19 CM DIALYSIS CATHETER;  Surgeon: Sheree Penne Bruckner, MD;  Location: [facility] OR;  Service: Vascular;  Laterality: Right;   PERIPHERAL VASCULAR BALLOON ANGIOPLASTY Left 10/06/2023   Procedure: PERIPHERAL VASCULAR BALLOON ANGIOPLASTY;  Surgeon: Melia Lynwood ORN, MD;  Location: [facility] INVAS[facility] CV LAB;  Service: Cardiovascular;  Laterality: Left;  cephalic arch  TONSILLECTOMY     Family History  Family History  Problem Relation Age of Onset   Lupus Cousin    Cancer Neg Hx    Social History  reports that he quit smoking about 15 months ago. His smoking use included cigarettes. He quit smokeless tobacco use about 10 years ago. He reports that he does not currently use alcohol. He reports current drug use. Frequency: 7.00 times per week. Drug: Marijuana. Allergies  Allergies  Allergen Reactions   Wound Dressing Adhesive Other (See Comments)    Blisters     Home medications Prior to Admission medications   Medication Sig Start Date End Date Taking? Authorizing Provider  acetaminophen  (TYLENOL ) 500 MG tablet Take 1,000 mg by mouth every 6 (six) hours as needed for mild pain or moderate pain.   Yes [provider]  amLODipine  (NORVASC ) 10 MG tablet Take 1 tablet (10 mg total) by mouth daily. 10/29/22  Yes Elgergawy, Brayton RAMAN, MD  atropine  1 % ophthalmic solution Place 1 drop into the left eye daily. 03/15/24  Yes [provider]  bismuth subsalicylate (PEPTO BISMOL) 262 MG chewable tablet Chew 524 mg by mouth daily as needed for indigestion or diarrhea or loose stools.   Yes [provider]  Calcium  Carbonate Antacid (TUMS PO) Take 1 tablet by mouth 2 (two) times daily as needed (heartburn).   Yes [provider]  cetirizine  (ZYRTEC ) 10 MG tablet Take 10 mg by mouth daily.   Yes [provider]  diphenhydrAMINE  (BENADRYL ) 25 MG tablet Take 25 mg by mouth at bedtime.   Yes [provider]  Glucagon (BAQSIMI ONE PACK) 3 MG/DOSE POWD Place 1 spray into the nose as needed (hypoglycemic episode).   Yes [provider]  Insulin  Glargine (BASAGLAR  KWIKPEN) 100 UNIT/ML Inject 18 Units into the skin at bedtime. 09/06/23  Yes [provider]  labetalol  (NORMODYNE ) 100 MG tablet Take 1 tablet (100 mg total) by mouth 2 (two) times daily. Patient taking differently: Take 100-200 mg by mouth See admin instructions. On dialysis days, take 200mg  (2 tablets) twice a day, and on non dialysis days take 200mg  (2 tablets) in the morning and 100mg  (1 tablet) at night. 10/29/22  Yes Elgergawy, Brayton RAMAN, MD  Melatonin 10 MG CHEW Chew 10-20 mg by mouth at bedtime as needed (Sleep).   Yes [provider]  Multiple Vitamins-Minerals (PRESERVISION AREDS 2 PO) Take 1 tablet by mouth in the morning.   Yes [provider]  Multiple Vitamins-Minerals (ZINC PO) Take 1 tablet by mouth daily.   Yes  [provider]  NOVOLOG  FLEXPEN 100 UNIT/ML FlexPen Inject 3-6 Units into the skin 3 (three) times daily with meals. 07/27/23  Yes [provider]  prednisoLONE  acetate (PRED FORTE ) 1 % ophthalmic suspension Apply 1 drop to eye See admin instructions.  Place 1 drop into the left eye as directed for 28 days 4x/day for 1 wk, then 3x/day for 1 wk, then 2x/day for 1 wk, then 1x/day for 1 wk, then STOP 03/15/24 04/12/24 Yes [provider]  sevelamer carbonate (RENVELA) 800 MG tablet Take 2,400 mg by mouth See admin instructions. Take 3 tablets (2400 mg) by mouth with each meal & with each snack   Yes [provider]  simethicone  (MYLICON) 80 MG chewable tablet Chew 80 mg by mouth every 6 (six) hours as needed for flatulence.   Yes [provider]     Vitals:   03/26/24 9761 03/26/24 9642 03/26/24  0750 03/26/24 1400  BP: (!) 156/95 101/68 (!) 180/106 (!) 167/89  Pulse: (!) 118 (!) 109 100 (!) 104  Resp: 20 20 15 12   Temp: 98 F (36.7 C) 98.2 F (36.8 C) 98.2 F (36.8 C) 98.5 F (36.9 C)  TempSrc: Oral Oral Oral Oral  SpO2: 94% 98% 90% 100%  Weight: 88.1 kg     Height: 5' 10 (1.778 m)      Exam Gen alert, no distress, looks a bit tired No rash, cyanosis or gangrene Sclera anicteric, throat clear  No jvd or bruits Chest clear bilat to bases, no rales/ wheezing RRR no MRG Abd soft ntnd no mass or ascites +bs GU deferred MS no joint effusions or deformity Ext trace LE edema, no other edema Neuro is lethargic, easy to awaken, nf     AVF+ bruit   Home bp meds: Norvasc  10 every day (ran out) Labetalol  100 bid    OP HD: [facility] MWF 4h  B400   82.4kg  2K bath  AVF  Heparin  4000 Last OP HD 8/4, post wt 86.2kg (+3.8) Good compliance    Assessment/ Plan: DKA: doesn't need saline-containing IVFs since he is esrd and wt's are up. Have changed IVF's to just D5W 50 cc/hr.  Sepsis: possible PNA , r/o acute cholecystitis. Per pmd.  ESRD: on HD MWF.  Next HD tomorrow.  HTN: home meds on hold for now, BP's wnl range. Follow.  Volume: up 5-6kg by wts, mild edema LE's, mild congestion on xray. Plan UF 2-3 L w/ next HD.  Anemia of esrd: 7- 9, tranfuse prn, will follow.    Myer Fret  MD [facility] 03/26/2024, 3:36 PM  Recent Labs  Lab 03/25/24 1820 03/26/24 0355 03/26/24 0928  HGB 8.1*  --  7.4*  ALBUMIN  4.0  --  2.8*  CALCIUM  8.7* 7.9* 7.9*  CREATININE 6.14* 7.42* 7.00*  K 4.9 4.3 4.6   Inpatient medications:  atropine   1 drop Left Eye Daily   Chlorhexidine  Gluconate Cloth  6 each Topical Daily   erythromycin    Left Eye QHS   prednisoLONE  acetate  1 drop Left Eye Q8H   Followed by   NOREEN ON 03/30/2024] prednisoLONE  acetate  1 drop Left Eye BID   Followed by   NOREEN ON 04/06/2024] prednisoLONE  acetate  1 drop Left Eye Daily   sodium chloride  flush  3 mL Intravenous Q12H   sodium chloride  flush  3 mL Intravenous Q12H    sodium chloride      dextrose  50 mL/hr at 03/26/24 1526   doxycycline  (VIBRAMYCIN ) IV 100 mg (03/26/24 1528)   insulin  3.6 Units/hr (03/26/24 1451)   piperacillin -tazobactam (ZOSYN )  IV 2.25 g (03/26/24 1437)   sodium chloride , acetaminophen  **OR** [DISCONTINUED] acetaminophen , dextrose , HYDROmorphone  (DILAUDID ) injection, ondansetron  **OR** ondansetron  (ZOFRAN ) IV, prochlorperazine , sodium chloride  flush

## 2024-03-26 NOTE — H&P (Addendum)
 History and Physical    John [name] FMW:[id number] DOB: May 14, 1991 DOA: 03/25/2024  PCP: Teresa Aldona CROME, NP   Patient coming from: Home   Chief Complaint:  Chief Complaint  Patient presents with   Altered Mental Status   ED TRIAGE note:Pt POV from dialysis, per girlfriend pt appeared to seem to be confused during dialysis so they advised he come to ED. Per girlfriend pt has been fatigued past few days, endorses loss of appetite and multiple episodes of emesis. CBG 396 in triage.   HPI:  33 year old male with history of diabetes mellitus type 1, ESRD on hemodialysis TTS schedule, hypertension, hypertrophic cardiomyopathy, history of bilateral retinal detachment causing blindness in 2023 and chronic anemia presents to the ER feeling weak and tired and fatigued.   Patient's girlfriend at the bedside reported that at the end of dialysis session today patient become confused and started developing vomiting multiple times.  Patient did not have any fever, chill, abdominal pain, hemoptysis, hematemesis and melena.  Patient took insulin  8/3  before going for dialysis 8/4 morning.  Patient also denies any productive cough.  Other workup at [facility] ED showed patient's troponins elevated but flat.  Labs show elevated anion gap acidosis with beta-hydroxybutyrate acid more than 8.  Features are concerning for possible DKA and was started on insulin  infusion.  CT abdomen and chest shows possible pneumonia for which patient was started on antibiotics and ultrasound abdomen shows gallbladder wall thickening with no Beverley sign so is not sure if patient definitely has cholecystitis.  Patient was transferred to [facility] for management of DKA, pneumonia acute cholecystitis.     ED Course:  At presentation to ED patient found normotensive, tachycardic, tachypneic otherwise hemodynamically stable. cbg 396. VBG showing normal pH 7.3, low bicarb level 19 and elevated anion gap. CMP  showed low bicarb 17, elevated anion gap 36, blood glucose 439.  Evidence of ESRD.  Elevated AST/ALT/ALP. Elevated beta-hydroxybutyrate level above 8. Normal blood alcohol level. CBC showing leukocytosis, stable H&H 8.1 and 24.  And normal platelet count. Initial lactic acid 2.7 which has been ended up to 3.4. Troponin 514 and second troponin 453. EKG showed sinus tachycardia heart rate 110, left ventricular hypertrophy pattern.  Prolonged QTc interval.  CT head no acute intercranial abnormality.Diffuse hyperdensity in the left globe, may be related to prior surgery or trauma. Recommend correlation with clinical history.  Chest x-ray showing cardiomegaly with pulmonary vascular congestion.  Patchy opacities of the right hilar lung representing atelectasis pneumonia.  Small left pleural effusion.  CT chest abdomen pelvis: IMPRESSION: 1. Right upper lobe peribronchovascular patchy ground-glass and centrally consolidative airspace opacities. To a smaller extent similar findings at the left anterior apex, bilateral lower lobes and right middle lobe. Finding could represent infection/inflammation with alveolar hemorrhage not excluded. 2.  Trace to small volume right pleural effusion. 3. Nonspecific pericholecystic free fluid. Correlate with liver function tests and consider right upper quadrant ultrasound if clinically indicated. 4. 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 5. Bilateral gynecomastia, right slightly greater than left. Recommend correlation with physical exam.  US  ABD:  IMPRESSION: 1. Gallbladder wall thickening and pericholecystic fluid without cholelithiasis or sonographic Murphy's sign. Findings are suggestive but not definitive for acute cholecystitis 2. Mildly dilated common bile duct measuring 7 mm.  In the ED patient has been given 1 L of  NS bolus  currently on insulin  drip with DKA protocol.  Also received ceftriaxone  and azithromycin .  Hospitalist has been consulted for management of DKA type I, transaminitis, metabolic acidosis, elevated troponin secondary to demand ischemia, sepsis secondary to pneumonia and acute cholecystitis.    Significant labs in the ED: Lab Orders         Resp panel by RT-PCR (RSV, Flu A&B, Covid) Anterior Nasal Swab         Blood culture (routine x 2)         Expectorated Sputum Assessment w Gram Stain, Rflx to Resp Cult         Respiratory (~20 pathogens) panel by PCR         Comprehensive metabolic panel         CBC         Urinalysis, Routine w reflex microscopic -Urine, Clean Catch         Magnesium         Beta-hydroxybutyric acid         Ethanol         Lactic acid, plasma         Basic metabolic panel         Beta-hydroxybutyric acid         Glucose, capillary         HIV Antibody (routine testing w rflx)         Procalcitonin         Protime-INR         CBC         Comprehensive metabolic panel with GFR         Beta-hydroxybutyric acid         Glucose, capillary         CBG monitoring, ED         I-Stat venous blood gas, ([facility] ED, [facility], [facility])         CBG monitoring, ED         CBG monitoring, ED         CBG monitoring, ED         CBG monitoring, ED         CBG monitoring, ED         CBG monitoring, ED       Review of Systems:  Review of Systems  Constitutional:  Positive for malaise/fatigue. Negative for chills, fever and weight loss.  Eyes:        History of bilateral visual loss  Respiratory:  Negative for cough, sputum production and shortness of breath.   Cardiovascular:  Negative for chest pain, palpitations and leg swelling.  Gastrointestinal:  Positive for nausea and vomiting. Negative for abdominal pain, diarrhea and heartburn.  Genitourinary:  Negative for dysuria and urgency.  Musculoskeletal:  Negative for myalgias.  Neurological:  Negative for dizziness and headaches.   Psychiatric/Behavioral:  The patient is not nervous/anxious.     Past Medical History:  Diagnosis Date   ADD (attention deficit disorder)    Allergic rhinitis    Anemia    hx ckd   Asthma    patient denies this dx, no inhalers   Chronic kidney disease    ESRD on dialysis TTHS   Complication of anesthesia    N/V   Diabetic ketoacidosis without coma associated with type 1 diabetes mellitus (HCC)    DKA (diabetic ketoacidoses)    Family history of adverse reaction to anesthesia    mother - nausea  Goiter, unspecified 02/04/2011   Headache    with elevate blood pressure   Hypertension    IDA (iron deficiency anemia)    Pneumonia    Possiblly 10/25/22-  he was told, from chest X-ray   PONV (postoperative nausea and vomiting)    Type I (juvenile type) diabetes mellitus without mention of complication, uncontrolled 02/04/2011   On Insulin     Past Surgical History:  Procedure Laterality Date   A/V FISTULAGRAM N/A 10/06/2023   Procedure: A/V Fistulagram;  Surgeon: Melia Lynwood ORN, MD;  Location: [facility];  Service: Cardiovascular;  Laterality: N/A;   AV FISTULA PLACEMENT Left 10/25/2022   Procedure: CEPHALIC ARTERIOVENOUS (AV) FISTULA CREATION;  Surgeon: Sheree Penne Bruckner, MD;  Location: [facility] OR;  Service: Vascular;  Laterality: Left;   CAPD INSERTION N/A 01/17/2023   Procedure: LAPAROSCOPIC INSERTION CONTINUOUS AMBULATORY PERITONEAL DIALYSIS  (CAPD) CATHETER;  Surgeon: Sheree Penne Bruckner, MD;  Location: [facility] OR;  Service: Vascular;  Laterality: N/A;   CAPD REMOVAL N/A 05/12/2023   Procedure: REMOVAL CONTINUOUS AMBULATORY PERITONEAL DIALYSIS  (CAPD) CATHETER;  Surgeon: Sheree Penne Bruckner, MD;  Location: [facility] OR;  Service: Vascular;  Laterality: N/A;   EYE SURGERY Right 2024   INSERTION OF DIALYSIS CATHETER Right 10/25/2022   Procedure: INSERTION OF TUNNELED PALINDROME 14.5 FR X 19 CM DIALYSIS CATHETER;  Surgeon: Sheree Penne Bruckner, MD;  Location: [facility] OR;   Service: Vascular;  Laterality: Right;   PERIPHERAL VASCULAR BALLOON ANGIOPLASTY Left 10/06/2023   Procedure: PERIPHERAL VASCULAR BALLOON ANGIOPLASTY;  Surgeon: Melia Lynwood ORN, MD;  Location: [facility] INVAS[facility] CV LAB;  Service: Cardiovascular;  Laterality: Left;  cephalic arch   TONSILLECTOMY       reports that he quit smoking about 15 months ago. His smoking use included cigarettes. He quit smokeless tobacco use about 10 years ago. He reports that he does not currently use alcohol. He reports current drug use. Frequency: 7.00 times per week. Drug: Marijuana.  No Known Allergies  Family History  Problem Relation Age of Onset   Lupus Cousin    Cancer Neg Hx     Prior to Admission medications   Medication Sig Start Date End Date Taking? Authorizing Provider  acetaminophen  (TYLENOL ) 500 MG tablet Take 1,000 mg by mouth every 6 (six) hours as needed for mild pain or moderate pain.    [provider]  amLODipine  (NORVASC ) 10 MG tablet Take 1 tablet (10 mg total) by mouth daily. 10/29/22   Elgergawy, Brayton RAMAN, MD  bismuth subsalicylate (PEPTO BISMOL) 262 MG chewable tablet Chew 524 mg by mouth daily as needed for indigestion or diarrhea or loose stools.    [provider]  Calcium  Carbonate Antacid (TUMS PO) Take 1 tablet by mouth 2 (two) times daily as needed (heartburn).    [provider]  Glucagon (BAQSIMI ONE PACK) 3 MG/DOSE POWD Place 1 spray into the nose as needed (hypoglycemic episode).    [provider]  glucose blood (ONE TOUCH ULTRA TEST) test strip Use as instructed 3x daily 05/04/17   Riccio, Angela C, DO  Insulin  Glargine (BASAGLAR  KWIKPEN) 100 UNIT/ML Inject 18 Units into the skin at bedtime. 09/06/23   [provider]  Insulin  Pen Needle 31G X 8 MM MISC 1 each by Does not apply route 4 (four) times daily -  with meals and at bedtime. 03/24/15   Newlin, Enobong, MD  labetalol  (NORMODYNE ) 100 MG tablet Take 1 tablet (100 mg total) by mouth 2 (two)  times  daily. 10/29/22   Elgergawy, Brayton RAMAN, MD  Melatonin 10 MG CHEW Chew 10-20 mg by mouth at bedtime as needed (Sleep).    [provider]  Multiple Vitamins-Minerals (PRESERVISION AREDS 2 PO) Take 1 tablet by mouth in the morning.    [provider]  NOVOLOG  FLEXPEN 100 UNIT/ML FlexPen Inject 3-6 Units into the skin 3 (three) times daily with meals. 07/27/23   [provider]  sevelamer carbonate (RENVELA) 800 MG tablet Take 2,400 mg by mouth See admin instructions. Take 3 tablets (2400 mg) by mouth with each meal & with each snack    [provider]  simethicone  (MYLICON) 80 MG chewable tablet Chew 80 mg by mouth every 6 (six) hours as needed for flatulence.    [provider]     Physical Exam: Vitals:   03/26/24 0105 03/26/24 0124 03/26/24 0238 03/26/24 0357  BP:  (!) 165/63 (!) 156/95 101/68  Pulse:  (!) 117 (!) 118 (!) 109  Resp:  19 20 20   Temp: 98 F (36.7 C) 97.8 F (36.6 C) 98 F (36.7 C) 98.2 F (36.8 C)  TempSrc: Oral Oral Oral Oral  SpO2:  100% 94% 98%  Weight:   88.1 kg   Height:   5' 10 (1.778 m)     Physical Exam Vitals and nursing note reviewed.  Constitutional:      General: He is not in acute distress.    Appearance: He is ill-appearing. He is not toxic-appearing.  HENT:     Mouth/Throat:     Mouth: Mucous membranes are dry.  Eyes:     Pupils: Pupils are equal, round, and reactive to light.  Cardiovascular:     Rate and Rhythm: Regular rhythm. Tachycardia present.     Pulses: Normal pulses.     Heart sounds: Normal heart sounds.  Pulmonary:     Effort: Pulmonary effort is normal.     Breath sounds: Normal breath sounds.  Abdominal:     Palpations: Abdomen is soft.     Tenderness: There is no abdominal tenderness. There is no guarding or rebound.     Hernia: No hernia is present.  Musculoskeletal:     Right lower leg: No edema.     Left lower leg: No edema.  Skin:    General: Skin is dry.     Capillary  Refill: Capillary refill takes less than 2 seconds.  Neurological:     Mental Status: He is alert and oriented to person, place, and time.  Psychiatric:        Mood and Affect: Mood normal.      Labs on Admission: I have personally reviewed following labs and imaging studies  CBC: Recent Labs  Lab 03/25/24 1806 03/25/24 1820  WBC  --  15.0*  HGB 17.0 8.1*  HCT 50.0 24.2*  MCV  --  94.2  PLT  --  295   Basic Metabolic Panel: Recent Labs  Lab 03/25/24 1806 03/25/24 1820  NA 134* 139  K 4.5 4.9  CL  --  86*  CO2  --  17*  GLUCOSE  --  439*  BUN  --  56*  CREATININE  --  6.14*  CALCIUM   --  8.7*  MG  --  2.4   GFR: Estimated Creatinine Clearance: 19.5 mL/min (A) (by C-G formula based on SCr of 6.14 mg/dL (H)). Liver Function Tests: Recent Labs  Lab 03/25/24 1820  AST 54*  ALT 46*  ALKPHOS 136*  BILITOT 0.8  PROT 6.5  ALBUMIN  4.0   No results for input(s): LIPASE, AMYLASE in the last 168 hours. No results for input(s): AMMONIA in the last 168 hours. Coagulation Profile: Recent Labs  Lab 03/26/24 0355  INR 1.5*   Cardiac Enzymes: No results for input(s): CKTOTAL, CKMB, CKMBINDEX, TROPONINI, TROPONINIHS in the last 168 hours. BNP (last 3 results) No results for input(s): BNP in the last 8760 hours. HbA1C: No results for input(s): HGBA1C in the last 72 hours. CBG: Recent Labs  Lab 03/26/24 0042 03/26/24 0118 03/26/24 0155 03/26/24 0255 03/26/24 0402  GLUCAP 482* 459* 414* 351* 296*   Lipid Profile: No results for input(s): CHOL, HDL, LDLCALC, TRIG, CHOLHDL, LDLDIRECT in the last 72 hours. Thyroid  Function Tests: No results for input(s): TSH, T4TOTAL, FREET4, T3FREE, THYROIDAB in the last 72 hours. Anemia Panel: No results for input(s): VITAMINB12, FOLATE, FERRITIN, TIBC, IRON, RETICCTPCT in the last 72 hours. Urine analysis:    Component Value Date/Time   COLORURINE YELLOW 10/20/2022 1646    APPEARANCEUR CLEAR 10/20/2022 1646   LABSPEC 1.020 10/20/2022 1646   PHURINE 6.0 10/20/2022 1646   GLUCOSEU 250 (A) 10/20/2022 1646   HGBUR SMALL (A) 10/20/2022 1646   BILIRUBINUR NEGATIVE 10/20/2022 1646   KETONESUR NEGATIVE 10/20/2022 1646   PROTEINUR >=300 (A) 10/20/2022 1646   UROBILINOGEN 0.2 03/16/2015 1040   NITRITE NEGATIVE 10/20/2022 1646   LEUKOCYTESUR NEGATIVE 10/20/2022 1646    Radiological Exams on Admission: I have personally reviewed images US  Abdomen Limited RUQ (LIVER/GB) Result Date: 03/25/2024 EXAM: Right Upper Quadrant Abdominal Ultrasound 03/25/2024 10:25:00 PM TECHNIQUE: Real-time ultrasonography of the right upper quadrant of the abdomen was performed. COMPARISON: None available. CLINICAL HISTORY: Vomiting; Abnormal CT scan. ICD-10 codes: R11.0, R93.8. FINDINGS: LIVER: The liver demonstrates normal echogenicity. No intrahepatic biliary ductal dilatation. No evidence of mass. BILIARY SYSTEM: There is pericholecystic fluid and wall thickening of the gallbladder. No cholelithiasis. Negative sonographic Murphy's sign. Common bile duct is mildly dilated measuring 7 mm. RIGHT KIDNEY: The right kidney is grossly unremarkable in appearances without evidence of hydronephrosis, echogenic calculi or worrisome mass lesions. OTHER: No right upper quadrant ascites. IMPRESSION: 1. Gallbladder wall thickening and pericholecystic fluid without cholelithiasis or sonographic Murphy's sign. Findings are suggestive but not definitive for acute cholecystitis 2. Mildly dilated common bile duct measuring 7 mm. Electronically signed by: Norman Gatlin MD 03/25/2024 11:13 PM EDT RP Workstation: [id number]   CT CHEST ABDOMEN PELVIS W CONTRAST Result Date: 03/25/2024 CLINICAL DATA:  Sepsis concern for sepsis, abnl CXR, vomiting and abdominal pain POV from dialysis, per girlfriend pt appeared to seem to be confused during dialysis so they advised he come to ED. EXAM: CT CHEST, ABDOMEN, AND PELVIS WITH  CONTRAST TECHNIQUE: Multidetector CT imaging of the chest, abdomen and pelvis was performed following the standard protocol during bolus administration of intravenous contrast. RADIATION DOSE REDUCTION: This exam was performed according to the departmental dose-optimization program which includes automated exposure control, adjustment of the mA and/or kV according to patient size and/or use of iterative reconstruction technique. CONTRAST:  80mL OMNIPAQUE  IOHEXOL  300 MG/ML  SOLN COMPARISON:  CT abdomen pelvis 10/21/2022, CT renal 03/16/2015 FINDINGS: CT CHEST FINDINGS Cardiovascular: Normal heart size. No significant pericardial effusion. The thoracic aorta is normal in caliber. No atherosclerotic plaque of the thoracic aorta. No coronary artery calcifications. Mediastinum/Nodes: No enlarged mediastinal, hilar, or axillary lymph nodes. Thyroid  gland, trachea, and esophagus demonstrate no significant findings. Lungs/Pleura: Right lower lobe passive atelectasis. Right upper lobe peribronchovascular patchy  ground-glass and centrally consolidative airspace opacities. T0 a smaller extent similar finding at the left anterior apex, bilateral lower lobes and right middle lobe. No pulmonary mass. Trace to small volume right pleural effusion. No pneumothorax. Musculoskeletal: Bilateral gynecomastia, right slightly greater than left. No suspicious lytic or blastic osseous lesions. No acute displaced fracture. CT ABDOMEN PELVIS FINDINGS Hepatobiliary: No focal liver abnormality. No gallstones, gallbladder wall thickening. Trace pericholecystic fluid. No biliary dilatation. Pancreas: No focal lesion. Normal pancreatic contour. No surrounding inflammatory changes. No main pancreatic ductal dilatation. Spleen: Normal in size without focal abnormality. Adrenals/Urinary Tract: No adrenal nodule bilaterally. Bilateral kidneys enhance symmetrically. No hydronephrosis. No hydroureter. The urinary bladder is unremarkable. Stomach/Bowel:  Stomach is within normal limits. No evidence of bowel wall thickening or dilatation. No pneumatosis appendix appears normal. 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 Abdominal aorta or iliac aneurysm. No abdominal, pelvic, or inguinal lymphadenopathy. Reproductive: Prostate is unremarkable. Other: No intraperitoneal free fluid. No intraperitoneal free gas. No organized fluid collection. Musculoskeletal: No abdominal wall hernia or abnormality. No suspicious lytic or blastic osseous lesions. No acute displaced fracture. IMPRESSION: 1. Right upper lobe peribronchovascular patchy ground-glass and centrally consolidative airspace opacities. To a smaller extent similar findings at the left anterior apex, bilateral lower lobes and right middle lobe. Finding could represent infection/inflammation with alveolar hemorrhage not excluded. 2.  Trace to small volume right pleural effusion. 3. Nonspecific pericholecystic free fluid. Correlate with liver function tests and consider right upper quadrant ultrasound if clinically indicated. 4. 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 5. Bilateral gynecomastia, right slightly greater than left. Recommend correlation with physical exam. Electronically Signed   By: Morgane  Naveau M.D.   On: 03/25/2024 20:57   DG Chest 2 View Result Date: 03/25/2024 CLINICAL DATA:  Altered level of consciousness, end-stage renal disease. Fatigue. Vomiting. EXAM: CHEST - 2 VIEW COMPARISON:  10/25/2022 FINDINGS: Chronic cardiomegaly. Vascular congestion. Additional patchy opacity in the right suprahilar lung. Small left pleural effusion. No pneumothorax. No acute osseous findings. IMPRESSION: 1. Chronic cardiomegaly with vascular congestion. 2. Patchy opacity in the  right suprahilar lung, may be atelectasis or pneumonia. 3. Small left pleural effusion. Electronically Signed   By: Andrea Gasman M.D.   On: 03/25/2024 18:09   CT Head Wo Contrast Result Date: 03/25/2024 CLINICAL DATA:  Mental status change, unknown cause ALOC, ESRD EXAM: CT HEAD WITHOUT CONTRAST TECHNIQUE: Contiguous axial images were obtained from the base of the skull through the vertex without intravenous contrast. RADIATION DOSE REDUCTION: This exam was performed according to the departmental dose-optimization program which includes automated exposure control, adjustment of the mA and/or kV according to patient size and/or use of iterative reconstruction technique. COMPARISON:  Remote CT 06/16/2009 FINDINGS: Brain: Segmental imaging due to motion. No intracranial hemorrhage, mass effect, or midline shift. No hydrocephalus. The basilar cisterns are patent. No evidence of territorial infarct or acute ischemia. No extra-axial or intracranial fluid collection. Vascular: Atherosclerosis of skullbase vasculature without hyperdense vessel or abnormal calcification. Skull: No fracture or focal lesion. Sinuses/Orbits: Diffuse hyperdensity in the left lobe. Right cataract resection. Other: None. IMPRESSION: 1. No acute intracranial abnormality. 2. Diffuse hyperdensity in the left globe, may be related to prior surgery or trauma. Recommend correlation with clinical history. Electronically Signed   By: Andrea  Sanford M.D.   On: 03/25/2024 18:07     EKG: My personal interpretation of EKG shows: Sinus tachycardia heart rate 110.  There is no history of abnormality.    Assessment/Plan: Principal Problem:   Diabetic acidosis, type I (HCC) Active Problems:   Sepsis (HCC)   Essential hypertension   Insulin  dependent type 1 diabetes mellitus (HCC)   ESRD (end stage renal disease) (HCC)   Hypertrophic cardiomyopathy (HCC)   Chronic anemia   Acute cholecystitis   CAP (community acquired pneumonia)     Assessment and Plan: DKA type I Insulin -dependent DM type II - Patient has been present emergency department accompanied by patient's girlfriend with complaining of more confusion during dialysis fatigue/lethargy, poor appetite and multiple episodes of vomiting today.  Further workup in the ED revealed high anion gap metabolic acidosis, hyperglycemic crisis and patient is in DKA. - CBG 396. VBG showing normal pH 7.3, low bicarb level 19 and elevated anion gap. CMP showed low bicarb 17, elevated anion gap 36, blood glucose 439.  Evidence of ESRD.  Elevated AST/ALT/ALP. Elevated beta-hydroxybutyrate level above 8. Normal blood alcohol level. CBC showing leukocytosis, stable H&H 8.1 and 24.  And normal platelet count. Initial lactic acid 2.7 which has been ended up to 3.4. Troponin 514 and second troponin 453. EKG showed sinus tachycardia heart rate 110, left ventricular hypertrophy pattern.  Prolonged QTc interval. -Patient also found to have sepsis in the setting of pneumonia acute cholecystitis. -In the ED patient received 1 L of NS bolus. -Continue insulin  drip with DKA protocol. - Continue LR with lower rate 75 cc/h given dialysis patient.  If blood glucose drop below 250 switch to D5 LR 125 cc/h. - Continue check BMP every 4 hours and beta-hydroxybutyrate every 4 hours. - Once anion gap will be closed and bicarb will be normalized in that case we will transition to subcu insulin .  Sepsis secondary to pneumonia and acute cholecystitis -Patient is tachycardic, tachypneic, elevated lactic acid CBC showing leukocytosis.   -Chest x-ray and CT chest showed evidence of bronchopneumonia. CT chest also stated alveolar hemorrhage however patient does not have any evidence of hemoptysis. -Discussed case with pulmonology Dr. Layman reviewed imaging.  At the bedside history obtained from the patient he did not have any episodes of hematemesis or hemoptysis.  No history of tuberculosis and recent  incarceration.  Pulmonology will evaluate patient for formal consult tomorrow.  Recommended to check INR and full respiratory panel. - CMP showed transaminitis.  Following CT abdomen pelvis showing pericholecystic fluid and ultrasound showing gallbladder wall thickening concern for cholecystitis without any evidence of cholelithiasis. -In ED patient received 1 L of NS bolus and lactic acid level has been normalized. - Also received ceftriaxone  and azithromycin  in the ED. -Pending blood culture, sputum culture, respiratory panel, and procalcitonin level. -Continue broad-spectrum antibiotic coverage with IV Zosyn  to treat for acute cholecystitis and adding IV doxycycline  for MRSA coverage for the management of pneumonia. -Sent secure chat message to general surgery team for evaluation in the daytime. Continue low rate of maintenance fluid LR 75 cc/h (dialysis patient)   Essential hypertension Hypertrophic cardiomyopathy Patient's blood pressure is variable in between normotensive and hypertensive episodes.  Holding amlodipine  and labetalol  in the setting of sepsis.   Elevated troponin-secondary to demand ischemia -Elevated troponin 514 and second troponin 453.  No delta change.  Patient denies any chest pain and chest pressure.  Denies any shortness of breath.  EKG showing sinus tachycardia and there is no  ST-T wave abnormality.  Prolonged QTc. Chest x-ray showing pulmonary vascular congestion and cardiomegaly. -Previous echocardiogram showed hypertrophic cardiomyopathy and stress test no there is no wall motion abnormality. -Discussed findings of elevated troponin with on-call cardiology Dr. Gail per discussion elevated troponin secondary to demand ischemia in the context of sepsis and poor drug clearance due to ESRD. -If patient develops any chest pain in that case we will consult cardiology for formal evaluation. - Continue to trend troponin and obtaining echocardiogram.   Altered mental  status - Altered mental status initially in the setting of DKA and sepsis.  -CT head no acute intracranial abnormality. 2. Diffuse hyperdensity in the left globe, may be related to prior surgery or trauma. Recommend correlation with clinical history. -Patient mentation has been improving since has been treating for DKA and sepsis.  Prolonged QTc interval -Need to avoid further prolonging medication.  ESRD on dialysis Physical exam patient is seems dehydrated in the setting of DKA.  Chest x-ray showing pulmonary vessel congestion cardiomegaly.  BMP no evidence of electrolyte derangement and there is no need for emergent dialysis tonight. - Consulted nephrology Dr. Marlee for dialysis in the daytime.   Anemia of chronic disease - Stable H&H 8.1 and 24.  Continue to monitor.   DVT prophylaxis:  SCDs.  Deferring pharmacological DVT prophylaxis given CT chest stating questionable alveolar hemorrhage. Code Status:  Full Code Diet: N.p.o. Family Communication:   Family was present at bedside, at the time of interview. Opportunity was given to ask question and all questions were answered satisfactorily.  Disposition Plan: Insulin  drip until DKA resolved.   Pending evaluation by nephrology and general surgery. Consults: General surgery and nephrology Admission status:   Inpatient, Step Down Unit  Severity of Illness: The appropriate patient status for this patient is INPATIENT. Inpatient status is judged to be reasonable and necessary in order to provide the required intensity of service to ensure the patient's safety. The patient's presenting symptoms, physical exam findings, and initial radiographic and laboratory data in the context of their chronic comorbidities is felt to place them at high risk for further clinical deterioration. Furthermore, it is not anticipated that the patient will be medically stable for discharge from the hospital within 2 midnights of admission.   * I certify that  at the point of admission it is my clinical judgment that the patient will require inpatient hospital care spanning beyond 2 midnights from the point of admission due to high intensity of service, high risk for further deterioration and high frequency of surveillance required.DEWAINE    John Matton, MD [facility]  How to contact the [facility] Attending or Consulting provider 7A - 7P or covering provider during after hours 7P -7A, for this patient.  Check the care team in [facility] and look for a) attending/consulting [facility] provider listed and b) the [facility] team listed Log into [email or web address] and use [place]'s universal password to access. If you do not have the password, please contact the hospital operator. Locate the [facility] provider you are looking for under [facility] and page to a number that you can be directly reached. If you still have difficulty reaching the provider, please page the [facility] (Director on Call) for the Hospitalists listed on amion for assistance.  03/26/2024, 4:18 AM

## 2024-03-26 NOTE — Progress Notes (Addendum)
 John [name]  FMW:[id number] DOB: [birth year] DOA: 03/25/2024 PCP: Beryl Donnice BRAVO, MD    Brief Narrative:  33 year old with a history of DM type I, HTN, hypertrophic cardiomyopathy, bilateral retinal detachment causing blindness, chronic anemia, and ESRD on HD TTS who was sent to the ER 8/4 PM from his dialysis unit where he was found to be confused following several preceding days notable for loss of appetite with multiple episodes of vomiting.  Workup in the ER included CT chest and abdomen which suggested a possible pulmonary infiltrate.  Abdominal ultrasound suggested gallbladder wall thickening but no Murphy sign.  CT head was unrevealing.  Goals of Care:   Code Status: Full Code   DVT prophylaxis: SCDs Start: 03/26/24 0308 Place TED hose Start: 03/26/24 0308   Interim Hx: Patient was interviewed and examined by one of my partners earlier today.  Assessment & Plan:  DKA without coma in DM 1 Beta hydroxybutyrate greater than 8 at presentation, following trend w/ ongoing insulin  tx   Metabolic encephalopathy Due to DKA - CT head without acute findings  Possible pneumonia with possible sepsis POA Patient was tachycardic and tachypneic at presentation but this of course could simply be due to his DKA -monitor for any further clinical evidence to support a diagnosis of pneumonia -empiric antibiotic for now - RVP negative - CoViD and Influenza negative -elevated procalcitonin not helpful in the setting of ESRD  Possible Localized alveolar hemorrage w/o hemoptysis Noted on CT chest, in RUL/RML - Pulmonary following and directing workup   Possible acute cholecystitis Ultrasound noted gallbladder wall thickening but no evidence of cholelithiasis -no Murphy's sign on ultrasound or physical exam - Gen Surgery following - HIDA scan pending   ESRD on HD TTS Nephrology consulted to attend to ongoing dialysis  HTN with hypertrophic cardiomyopathy  Elevated troponin In the setting of  ESRD and DKA -no symptoms to suggest ACS -EKG without acute changes -elevated troponin likely due to poor clearance due to ESRD and demand ischemia - TTE pending - further eval will be required if patient develops chest pain or if there is a WMA on TTE    Anemia of chronic kidney disease   Family Communication: Spoke with significant other at bedside Disposition: Eventual discharge home   Objective: Blood pressure (!) 167/89, pulse (!) 104, temperature 98.5 F (36.9 C), temperature source Oral, resp. rate 12, height 5' 10 (1.778 m), weight 88.1 kg, SpO2 100%.  Intake/Output Summary (Last 24 hours) at 03/26/2024 1629 Last data filed at 03/26/2024 0318 Gross per 24 hour  Intake 0 ml  Output --  Net 0 ml   Filed Weights   03/26/24 0238  Weight: 88.1 kg    Examination: The patient was examined by one of my partners earlier today  CBC: Recent Labs  Lab 03/25/24 1806 03/25/24 1820 03/26/24 0928  WBC  --  15.0* 11.8*  HGB 17.0 8.1* 7.4*  HCT 50.0 24.2* 22.6*  MCV  --  94.2 96.6  PLT  --  295 320   Basic Metabolic Panel: Recent Labs  Lab 03/25/24 1820 03/26/24 0355 03/26/24 0928 03/26/24 1448  NA 139 137 139 137  K 4.9 4.3 4.6 5.3*  CL 86* 93* 98 97*  CO2 17* 22 24 21*  GLUCOSE 439* 320* 585* 175*  BUN 56* 82* 75* 85*  CREATININE 6.14* 7.42* 7.00* 7.92*  CALCIUM  8.7* 7.9* 7.9* 7.9*  MG 2.4  --   --   --    GFR:  Estimated Creatinine Clearance: 15.1 mL/min (A) (by C-G formula based on SCr of 7.92 mg/dL (H)).   Scheduled Meds:  atropine   1 drop Left Eye Daily   [START ON 03/27/2024] Chlorhexidine  Gluconate Cloth  6 each Topical Q0600   erythromycin    Left Eye QHS   prednisoLONE  acetate  1 drop Left Eye Q8H   Followed by   [START ON 03/30/2024] prednisoLONE  acetate  1 drop Left Eye BID   Followed by   NOREEN ON 04/06/2024] prednisoLONE  acetate  1 drop Left Eye Daily   sodium chloride  flush  3 mL Intravenous Q12H   sodium chloride  flush  3 mL Intravenous Q12H    Continuous Infusions:  sodium chloride      dextrose  50 mL/hr at 03/26/24 1526   doxycycline  (VIBRAMYCIN ) IV 100 mg (03/26/24 1528)   insulin  1.7 Units/hr (03/26/24 1622)   piperacillin -tazobactam (ZOSYN )  IV 2.25 g (03/26/24 1437)     LOS: 0 days   Reyes IVAR Moores, MD [facility] Office  [phone] Pager - Text Page per [place]  If 7PM-7AM, please contact night-coverage per Amion 03/26/2024, 4:29 PM

## 2024-03-26 NOTE — Consult Note (Signed)
 NAME:  John [name], MRN:  [id number], DOB:  1991-05-07, LOS: 0 ADMISSION DATE:  03/25/2024, CONSULTATION DATE:  8/5 REFERRING MD:  Dr. danton, CHIEF COMPLAINT:  pna; ?dah on ct  History of Present Illness:  Patient is a 33 year old male with pertinent PMH T1DM, ESRD on HD TTS, HTN, hypertrophic cardiomyopathy, chronic anemia presents to [facility] on 8/5 with DKA.  Patient came to med [facility] ED overnight on 8/4 for AMS.  Having episodes of vomiting.  Denies fever, chills, cough, abdominal pain.  Labs indicative of DKA.  Patient given IV fluids and started on insulin  drip.  CTA chest/abdomen/pelvis showing RUL patchy groundglass and centrally consolidative opacity; to a smaller extent seen in left anterior apex, BLL, RML; could represent infection/inflammation but alveolar hemorrhage not excluded.  Patient not having hemoptysis.  Hgb 8.1 and inr 1.5.  Patient afebrile and WBC 15.  Cultures obtained and given azithromycin  and Rocephin .  RVP negative.  PCCM consulted.   Pertinent  Medical History   Past Medical History:  Diagnosis Date   ADD (attention deficit disorder)    Allergic rhinitis    Anemia    hx ckd   Asthma    patient denies this dx, no inhalers   Chronic kidney disease    ESRD on dialysis TTHS   Complication of anesthesia    N/V   Diabetic ketoacidosis without coma associated with type 1 diabetes mellitus (HCC)    DKA (diabetic ketoacidoses)    Family history of adverse reaction to anesthesia    mother - nausea   Goiter, unspecified 02/04/2011   Headache    with elevate blood pressure   Hypertension    IDA (iron deficiency anemia)    Pneumonia    Possiblly 10/25/22-  he was told, from chest X-ray   PONV (postoperative nausea and vomiting)    Type I (juvenile type) diabetes mellitus without mention of complication, uncontrolled 02/04/2011   On Insulin      Significant Hospital Events: Including procedures, antibiotic start and stop dates in addition to other  pertinent events   8/5 admitted with DKA  Interim History / Subjective:  See above  Objective    Blood pressure (!) 180/106, pulse 100, temperature 98.2 F (36.8 C), temperature source Oral, resp. rate 15, height 5' 10 (1.778 m), weight 88.1 kg, SpO2 90%.        Intake/Output Summary (Last 24 hours) at 03/26/2024 0958 Last data filed at 03/26/2024 9681 Gross per 24 hour  Intake 0 ml  Output --  Net 0 ml   Filed Weights   03/26/24 0238  Weight: 88.1 kg    Examination: General:  NAD HEENT: MM pink/moist Neuro: Aox3; MAE CV: s1s2, RRR, no m/r/g PULM:  dim clear BS bilaterally GI: soft, bsx4 active  Extremities: warm/dry, no edema     Resolved problem list   Assessment and Plan   Possible pna -CTA chest/abdomen/pelvis showing RUL patchy groundglass and centrally consolidative opacity; to a smaller extent seen in left anterior apex, BLL, RML; could represent infection/inflammation but alveolar hemorrhage not excluded. Plan: -cont abx per primary for possible cap and possible acute cholecystitis; could consider checking legionella/strep -pulm toiletry -patient not coughing up any hemotpysis so low concern that patient has DAH; pccm will sign off and available as needed  Rest per primary  Best Practice (right click and Reselect all SmartList Selections daily)   Per primary  Labs   CBC: Recent Labs  Lab 03/25/24 1806 03/25/24 1820  03/26/24 0928  WBC  --  15.0* 11.8*  HGB 17.0 8.1* 7.4*  HCT 50.0 24.2* 22.6*  MCV  --  94.2 96.6  PLT  --  295 320    Basic Metabolic Panel: Recent Labs  Lab 03/25/24 1806 03/25/24 1820 03/26/24 0355  NA 134* 139 137  K 4.5 4.9 4.3  CL  --  86* 93*  CO2  --  17* 22  GLUCOSE  --  439* 320*  BUN  --  56* 82*  CREATININE  --  6.14* 7.42*  CALCIUM   --  8.7* 7.9*  MG  --  2.4  --    GFR: Estimated Creatinine Clearance: 16.1 mL/min (A) (by C-G formula based on SCr of 7.42 mg/dL (H)). Recent Labs  Lab 03/25/24 1820  03/25/24 2014 03/26/24 0355 03/26/24 0928  PROCALCITON  --   --  17.00  --   WBC 15.0*  --   --  11.8*  LATICACIDVEN 2.7* 3.4*  --   --     Liver Function Tests: Recent Labs  Lab 03/25/24 1820  AST 54*  ALT 46*  ALKPHOS 136*  BILITOT 0.8  PROT 6.5  ALBUMIN  4.0   No results for input(s): LIPASE, AMYLASE in the last 168 hours. No results for input(s): AMMONIA in the last 168 hours.  ABG    Component Value Date/Time   PHART 7.420 03/23/2016 0041   PCO2ART 38.3 03/23/2016 0041   PO2ART 99.0 03/23/2016 0041   HCO3 19.6 (L) 03/25/2024 1806   TCO2 21 (L) 03/25/2024 1806   ACIDBASEDEF 4.0 (H) 03/25/2024 1806   O2SAT 89 03/25/2024 1806     Coagulation Profile: Recent Labs  Lab 03/26/24 0355  INR 1.5*    Cardiac Enzymes: No results for input(s): CKTOTAL, CKMB, CKMBINDEX, TROPONINI in the last 168 hours.  HbA1C: Hemoglobin A1C  Date/Time Value Ref Range Status  04/19/2016 03:05 PM 10.0  Final  03/24/2015 03:11 PM 12.0  Final   Hgb A1c MFr Bld  Date/Time Value Ref Range Status  10/21/2022 07:53 AM 6.5 (H) 4.8 - 5.6 % Final    Comment:    (NOTE)         Prediabetes: 5.7 - 6.4         Diabetes: >6.4         Glycemic control for adults with diabetes: <7.0   07/07/2014 07:25 PM 9.1 (H) <5.7 % Final    Comment:    (NOTE)                                                                       According to the ADA Clinical Practice Recommendations for 2011, when HbA1c is used as a screening test:  >=6.5%   Diagnostic of Diabetes Mellitus           (if abnormal result is confirmed) 5.7-6.4%   Increased risk of developing Diabetes Mellitus References:Diagnosis and Classification of Diabetes Mellitus,Diabetes Care,2011,34(Suppl 1):S62-S69 and Standards of Medical Care in         Diabetes - 2011,Diabetes Care,2011,34 (Suppl 1):S11-S61.     CBG: Recent Labs  Lab 03/26/24 0456 03/26/24 0557 03/26/24 0657 03/26/24 0803 03/26/24 0903  GLUCAP 235*  191* 147* 135* 131*  Review of Systems:   Review of Systems  Constitutional:  Negative for fever.  Respiratory:  Negative for cough, hemoptysis and shortness of breath.   Cardiovascular:  Negative for chest pain.     Past Medical History:  He,  has a past medical history of ADD (attention deficit disorder), Allergic rhinitis, Anemia, Asthma, Chronic kidney disease, Complication of anesthesia, Diabetic ketoacidosis without coma associated with type 1 diabetes mellitus (HCC), DKA (diabetic ketoacidoses), Family history of adverse reaction to anesthesia, Goiter, unspecified (02/04/2011), Headache, Hypertension, IDA (iron deficiency anemia), Pneumonia, PONV (postoperative nausea and vomiting), and Type I (juvenile type) diabetes mellitus without mention of complication, uncontrolled (02/04/2011).   Surgical History:   Past Surgical History:  Procedure Laterality Date   A/V FISTULAGRAM N/A 10/06/2023   Procedure: A/V Fistulagram;  Surgeon: Melia Lynwood ORN, MD;  [facility] INVASIVE CV LAB;  Service: Cardiovascular;  Laterality: N/A;   AV FISTULA PLACEMENT Left 10/25/2022   Procedure: CEPHALIC ARTERIOVENOUS (AV) FISTULA CREATION;  Surgeon: Sheree Penne Bruckner, MD;  Location[facility] OR;  Service: Vascular;  Laterality: Left;   CAPD INSERTION N/A 01/17/2023   Procedure: LAPAROSCOPIC INSERTION CONTINUOUS AMBULATORY PERITONEAL DIALYSIS  (CAPD) CATHETER;  Surgeon: Sheree Penne Bruckner, MD;  Location: [facility] OR;  Service: Vascular;  Laterality: N/A;   CAPD REMOVAL N/A 05/12/2023   Procedure: REMOVAL CONTINUOUS AMBULATORY PERITONEAL DIALYSIS  (CAPD) CATHETER;  Surgeon: Sheree Penne Bruckner, MD;  Location: [facility] OR;  Service: Vascular;  Laterality: N/A;   EYE SURGERY Right 2024   INSERTION OF DIALYSIS CATHETER Right 10/25/2022   Procedure: INSERTION OF TUNNELED PALINDROME 14.5 FR X 19 CM DIALYSIS CATHETER;  Surgeon: Sheree Penne Bruckner, MD;  Location: [facility] OR;  Service: Vascular;  Laterality:  Right;   PERIPHERAL VASCULAR BALLOON ANGIOPLASTY Left 10/06/2023   Procedure: PERIPHERAL VASCULAR BALLOON ANGIOPLASTY;  Surgeon: Melia Lynwood ORN, MD;  Location: [facility] INVASIVE CV LAB;  Service: Cardiovascular;  Laterality: Left;  cephalic arch   TONSILLECTOMY       Social History:   reports that he quit smoking about 15 months ago. His smoking use included cigarettes. He quit smokeless tobacco use about 10 years ago. He reports that he does not currently use alcohol. He reports current drug use. Frequency: 7.00 times per week. Drug: Marijuana.   Family History:  His family history includes Lupus in his cousin. There is no history of Cancer.   Allergies No Known Allergies   Home Medications  Prior to Admission medications   Medication Sig Start Date End Date Taking? Authorizing Provider  acetaminophen  (TYLENOL ) 500 MG tablet Take 1,000 mg by mouth every 6 (six) hours as needed for mild pain or moderate pain.    [provider]  amLODipine  (NORVASC ) 10 MG tablet Take 1 tablet (10 mg total) by mouth daily. 10/29/22   Elgergawy, Brayton RAMAN, MD  atropine  1 % ophthalmic solution Place 1 drop into the left eye daily. 03/15/24   [provider]  bismuth subsalicylate (PEPTO BISMOL) 262 MG chewable tablet Chew 524 mg by mouth daily as needed for indigestion or diarrhea or loose stools.    [provider]  Calcium  Carbonate Antacid (TUMS PO) Take 1 tablet by mouth 2 (two) times daily as needed (heartburn).    [provider]  erythromycin  ophthalmic ointment SMARTSIG:Left Eye Every Evening 03/15/24   [provider]  Glucagon (BAQSIMI ONE PACK) 3 MG/DOSE POWD Place 1 spray into the nose as needed (hypoglycemic episode).    [provider]  glucose  blood (ONE TOUCH ULTRA TEST) test strip Use as instructed 3x daily 05/04/17   Riccio, Angela C, DO  Insulin  Glargine (BASAGLAR  KWIKPEN) 100 UNIT/ML Inject 18 Units into the skin at bedtime. 09/06/23   [provider]  labetalol  (NORMODYNE ) 100 MG tablet Take 1 tablet (100 mg total) by mouth 2 (two) times daily. 10/29/22   Elgergawy, Brayton RAMAN, MD  Melatonin 10 MG CHEW Chew 10-20 mg by mouth at bedtime as needed (Sleep).    [provider]  Multiple Vitamins-Minerals (PRESERVISION AREDS 2 PO) Take 1 tablet by mouth in the morning.    [provider]  NOVOLOG  FLEXPEN 100 UNIT/ML FlexPen Inject 3-6 Units into the skin 3 (three) times daily with meals. 07/27/23   [provider]  prednisoLONE  acetate (PRED FORTE ) 1 % ophthalmic suspension Apply 1 drop to eye See admin instructions.  Place 1 drop into the left eye as directed for 28 days 4x/day for 1 wk, then 3x/day for 1 wk, then 2x/day for 1 wk, then 1x/day for 1 wk, then STOP 03/15/24 04/12/24  [provider]  sevelamer carbonate (RENVELA) 800 MG tablet Take 2,400 mg by mouth See admin instructions. Take 3 tablets (2400 mg) by mouth with each meal & with each snack    [provider]  simethicone  (MYLICON) 80 MG chewable tablet Chew 80 mg by mouth every 6 (six) hours as needed for flatulence.    [provider]     Critical care time: NA     RODGER Emilio DEVONNA [facility] 03/26/2024, 9:58 AM  Please see [email or web address] for pager details.  From 7A-7P if no response, please call [phone]. After hours, please call ELink [phone].

## 2024-03-27 ENCOUNTER — Inpatient Hospital Stay (HOSPITAL_COMMUNITY)

## 2024-03-27 DIAGNOSIS — E1010 Type 1 diabetes mellitus with ketoacidosis without coma: Secondary | ICD-10-CM

## 2024-03-27 DIAGNOSIS — R918 Other nonspecific abnormal finding of lung field: Secondary | ICD-10-CM | POA: Diagnosis not present

## 2024-03-27 DIAGNOSIS — I422 Other hypertrophic cardiomyopathy: Secondary | ICD-10-CM

## 2024-03-27 DIAGNOSIS — E1110 Type 2 diabetes mellitus with ketoacidosis without coma: Secondary | ICD-10-CM | POA: Diagnosis not present

## 2024-03-27 DIAGNOSIS — N186 End stage renal disease: Secondary | ICD-10-CM

## 2024-03-27 DIAGNOSIS — A419 Sepsis, unspecified organism: Secondary | ICD-10-CM | POA: Diagnosis not present

## 2024-03-27 DIAGNOSIS — I10 Essential (primary) hypertension: Secondary | ICD-10-CM | POA: Diagnosis not present

## 2024-03-27 DIAGNOSIS — D649 Anemia, unspecified: Secondary | ICD-10-CM

## 2024-03-27 DIAGNOSIS — R7989 Other specified abnormal findings of blood chemistry: Secondary | ICD-10-CM | POA: Diagnosis not present

## 2024-03-27 LAB — BASIC METABOLIC PANEL WITH GFR
Anion gap: 20 — ABNORMAL HIGH (ref 5–15)
Anion gap: 23 — ABNORMAL HIGH (ref 5–15)
Anion gap: 22 — ABNORMAL HIGH (ref 5–15)
Anion gap: 19 — ABNORMAL HIGH (ref 5–15)
BUN: 88 mg/dL — ABNORMAL HIGH (ref 6–20)
BUN: 92 mg/dL — ABNORMAL HIGH (ref 6–20)
BUN: 97 mg/dL — ABNORMAL HIGH (ref 6–20)
BUN: 96 mg/dL — ABNORMAL HIGH (ref 6–20)
CO2: 22 mmol/L (ref 22–32)
CO2: 19 mmol/L — ABNORMAL LOW (ref 22–32)
CO2: 19 mmol/L — ABNORMAL LOW (ref 22–32)
CO2: 20 mmol/L — ABNORMAL LOW (ref 22–32)
Calcium: 7.9 mg/dL — ABNORMAL LOW (ref 8.9–10.3)
Calcium: 8.1 mg/dL — ABNORMAL LOW (ref 8.9–10.3)
Calcium: 8.3 mg/dL — ABNORMAL LOW (ref 8.9–10.3)
Calcium: 8 mg/dL — ABNORMAL LOW (ref 8.9–10.3)
Chloride: 89 mmol/L — ABNORMAL LOW (ref 98–111)
Chloride: 92 mmol/L — ABNORMAL LOW (ref 98–111)
Chloride: 92 mmol/L — ABNORMAL LOW (ref 98–111)
Chloride: 94 mmol/L — ABNORMAL LOW (ref 98–111)
Creatinine, Ser: 8.9 mg/dL — ABNORMAL HIGH (ref 0.61–1.24)
Creatinine, Ser: 9.58 mg/dL — ABNORMAL HIGH (ref 0.61–1.24)
Creatinine, Ser: 9.78 mg/dL — ABNORMAL HIGH (ref 0.61–1.24)
Creatinine, Ser: 10.19 mg/dL — ABNORMAL HIGH (ref 0.61–1.24)
GFR, Estimated: 7 mL/min — ABNORMAL LOW (ref >60)
GFR, Estimated: 7 mL/min — ABNORMAL LOW (ref >60)
GFR, Estimated: 7 mL/min — ABNORMAL LOW (ref >60)
GFR, Estimated: 6 mL/min — ABNORMAL LOW (ref >60)
Glucose, Bld: 314 mg/dL — ABNORMAL HIGH (ref 70–99)
Glucose, Bld: 241 mg/dL — ABNORMAL HIGH (ref 70–99)
Glucose, Bld: 170 mg/dL — ABNORMAL HIGH (ref 70–99)
Glucose, Bld: 180 mg/dL — ABNORMAL HIGH (ref 70–99)
Potassium: 3.9 mmol/L (ref 3.5–5.1)
Potassium: 4.3 mmol/L (ref 3.5–5.1)
Potassium: 4.4 mmol/L (ref 3.5–5.1)
Potassium: 4.3 mmol/L (ref 3.5–5.1)
Sodium: 131 mmol/L — ABNORMAL LOW (ref 135–145)
Sodium: 134 mmol/L — ABNORMAL LOW (ref 135–145)
Sodium: 133 mmol/L — ABNORMAL LOW (ref 135–145)
Sodium: 133 mmol/L — ABNORMAL LOW (ref 135–145)

## 2024-03-27 LAB — CBC WITH DIFFERENTIAL/PLATELET
Abs Immature Granulocytes: 0.09 K/uL — ABNORMAL HIGH (ref 0.00–0.07)
Basophils Absolute: 0 K/uL (ref 0.0–0.1)
Basophils Relative: 0 %
Eosinophils Absolute: 0.1 K/uL (ref 0.0–0.5)
Eosinophils Relative: 1 %
HCT: 26.3 % — ABNORMAL LOW (ref 39.0–52.0)
Hemoglobin: 8.6 g/dL — ABNORMAL LOW (ref 13.0–17.0)
Immature Granulocytes: 1 %
Lymphocytes Relative: 9 %
Lymphs Abs: 0.9 K/uL (ref 0.7–4.0)
MCH: 31.9 pg (ref 26.0–34.0)
MCHC: 32.7 g/dL (ref 30.0–36.0)
MCV: 97.4 fL (ref 80.0–100.0)
Monocytes Absolute: 0.8 K/uL (ref 0.1–1.0)
Monocytes Relative: 8 %
Neutro Abs: 8.5 K/uL — ABNORMAL HIGH (ref 1.7–7.7)
Neutrophils Relative %: 81 %
Platelets: 333 K/uL (ref 150–400)
RBC: 2.7 MIL/uL — ABNORMAL LOW (ref 4.22–5.81)
RDW: 14.1 % (ref 11.5–15.5)
WBC: 10.5 K/uL (ref 4.0–10.5)
nRBC: 0 % (ref 0.0–0.2)

## 2024-03-27 LAB — ECHOCARDIOGRAM COMPLETE
AR max vel: 2.27 cm2
AV Area VTI: 2.32 cm2
AV Area mean vel: 2.39 cm2
AV Mean grad: 5 mmHg
AV Peak grad: 8 mmHg
Ao pk vel: 1.41 m/s
Area-P 1/2: 3.6 cm2
Height: 70 in
S' Lateral: 3.5 cm
Weight: 3107.2 [oz_av]

## 2024-03-27 LAB — MAGNESIUM: Magnesium: 2.3 mg/dL (ref 1.7–2.4)

## 2024-03-27 LAB — GLUCOSE, CAPILLARY
Glucose-Capillary: 126 mg/dL — ABNORMAL HIGH (ref 70–99)
Glucose-Capillary: 126 mg/dL — ABNORMAL HIGH (ref 70–99)
Glucose-Capillary: 131 mg/dL — ABNORMAL HIGH (ref 70–99)
Glucose-Capillary: 147 mg/dL — ABNORMAL HIGH (ref 70–99)
Glucose-Capillary: 151 mg/dL — ABNORMAL HIGH (ref 70–99)
Glucose-Capillary: 160 mg/dL — ABNORMAL HIGH (ref 70–99)
Glucose-Capillary: 163 mg/dL — ABNORMAL HIGH (ref 70–99)
Glucose-Capillary: 167 mg/dL — ABNORMAL HIGH (ref 70–99)
Glucose-Capillary: 168 mg/dL — ABNORMAL HIGH (ref 70–99)
Glucose-Capillary: 170 mg/dL — ABNORMAL HIGH (ref 70–99)
Glucose-Capillary: 170 mg/dL — ABNORMAL HIGH (ref 70–99)
Glucose-Capillary: 173 mg/dL — ABNORMAL HIGH (ref 70–99)
Glucose-Capillary: 178 mg/dL — ABNORMAL HIGH (ref 70–99)
Glucose-Capillary: 182 mg/dL — ABNORMAL HIGH (ref 70–99)
Glucose-Capillary: 195 mg/dL — ABNORMAL HIGH (ref 70–99)
Glucose-Capillary: 210 mg/dL — ABNORMAL HIGH (ref 70–99)

## 2024-03-27 LAB — TROPONIN I (HIGH SENSITIVITY): Troponin I (High Sensitivity): 195 ng/L (ref <18)

## 2024-03-27 LAB — BETA-HYDROXYBUTYRIC ACID
Beta-Hydroxybutyric Acid: 1.32 mmol/L — ABNORMAL HIGH (ref 0.05–0.27)
Beta-Hydroxybutyric Acid: 2.86 mmol/L — ABNORMAL HIGH (ref 0.05–0.27)
Beta-Hydroxybutyric Acid: 1.91 mmol/L — ABNORMAL HIGH (ref 0.05–0.27)
Beta-Hydroxybutyric Acid: 2.18 mmol/L — ABNORMAL HIGH (ref 0.05–0.27)

## 2024-03-27 LAB — HEPATIC FUNCTION PANEL
ALT: 83 U/L — ABNORMAL HIGH (ref 0–44)
AST: 30 U/L (ref 15–41)
Albumin: 3.2 g/dL — ABNORMAL LOW (ref 3.5–5.0)
Alkaline Phosphatase: 94 U/L (ref 38–126)
Bilirubin, Direct: 0.1 mg/dL (ref 0.0–0.2)
Indirect Bilirubin: 1.1 mg/dL — ABNORMAL HIGH (ref 0.3–0.9)
Total Bilirubin: 1.2 mg/dL (ref 0.0–1.2)
Total Protein: 6.1 g/dL — ABNORMAL LOW (ref 6.5–8.1)

## 2024-03-27 LAB — BRAIN NATRIURETIC PEPTIDE: B Natriuretic Peptide: 2428.2 pg/mL — ABNORMAL HIGH (ref 0.0–100.0)

## 2024-03-27 LAB — SEDIMENTATION RATE: Sed Rate: 21 mm/h — ABNORMAL HIGH (ref 0–16)

## 2024-03-27 LAB — HEPATITIS B SURFACE ANTIBODY, QUANTITATIVE: Hep B S AB Quant (Post): <3.5 m[IU]/mL — ABNORMAL LOW

## 2024-03-27 LAB — PHOSPHORUS: Phosphorus: 7 mg/dL — ABNORMAL HIGH (ref 2.5–4.6)

## 2024-03-27 MED ORDER — SODIUM CHLORIDE 0.9 % IV SOLN: INTRAVENOUS | Status: DC

## 2024-03-27 MED ORDER — LABETALOL HCL 5 MG/ML IV SOLN
10.0000 mg | INTRAVENOUS | Status: DC | PRN
  Administered 2024-03-27 – 2024-03-30 (×10): 10 mg via INTRAVENOUS
  Filled 2024-03-27 (×10): qty 4

## 2024-03-27 MED ORDER — INSULIN ASPART 100 UNIT/ML IJ SOLN: 0.0000 [IU] | Freq: Every day | INTRAMUSCULAR | Status: DC

## 2024-03-27 MED ORDER — PANTOPRAZOLE SODIUM 40 MG IV SOLR
40.0000 mg | Freq: Two times a day (BID) | INTRAVENOUS | Status: DC
  Administered 2024-03-27 – 2024-03-30 (×7): 40 mg via INTRAVENOUS
  Filled 2024-03-27 (×7): qty 10

## 2024-03-27 MED ORDER — HYDRALAZINE HCL 20 MG/ML IJ SOLN
10.0000 mg | Freq: Four times a day (QID) | INTRAMUSCULAR | Status: DC | PRN
  Administered 2024-03-27: 10 mg via INTRAVENOUS
  Filled 2024-03-27: qty 1

## 2024-03-27 MED ORDER — FENTANYL CITRATE PF 50 MCG/ML IJ SOSY
12.5000 ug | PREFILLED_SYRINGE | INTRAMUSCULAR | Status: DC | PRN
  Administered 2024-03-27 – 2024-03-30 (×17): 25 ug via INTRAVENOUS
  Filled 2024-03-27 (×17): qty 1

## 2024-03-27 MED ORDER — SODIUM CHLORIDE 0.9 % IV BOLUS
250.0000 mL | Freq: Once | INTRAVENOUS | Status: AC
  Administered 2024-03-27: 250 mL via INTRAVENOUS

## 2024-03-27 MED ORDER — INSULIN ASPART 100 UNIT/ML IJ SOLN: 0.0000 [IU] | Freq: Three times a day (TID) | INTRAMUSCULAR | Status: DC

## 2024-03-27 MED ORDER — INSULIN GLARGINE-YFGN 100 UNIT/ML ~~LOC~~ SOLN
10.0000 [IU] | Freq: Every day | SUBCUTANEOUS | Status: DC
  Administered 2024-03-27: 10 [IU] via SUBCUTANEOUS
  Filled 2024-03-27: qty 0.1

## 2024-03-27 MED ORDER — HEPARIN SODIUM (PORCINE) 1000 UNIT/ML IJ SOLN
INTRAMUSCULAR | Status: AC
  Filled 2024-03-27: qty 4

## 2024-03-27 MED ORDER — SODIUM CHLORIDE 0.9 % IV BOLUS
500.0000 mL | Freq: Once | INTRAVENOUS | Status: AC
  Administered 2024-03-27: 500 mL via INTRAVENOUS

## 2024-03-27 NOTE — Hospital Course (Addendum)
 The patient is a 33 year old with a history of DM type I, HTN, hypertrophic cardiomyopathy, bilateral retinal detachment causing blindness, chronic anemia, and ESRD on HD TTS who was sent to the ER 8/4 PM from his dialysis unit where he was found to be confused following several preceding days notable for loss of appetite with multiple episodes of vomiting. Workup in the ER included CT chest and abdomen which suggested a possible pulmonary infiltrate. Abdominal ultrasound suggested gallbladder wall thickening but no Murphy sign. CT head was unrevealing.   General surgery evaluated and his HIDA scan showed filling of the gallbladder and this is not consistent with cholecystitis.  There is no gallstones noted and no right upper quadrant tenderness.  Surgery team did not feel he had an acute cholecystitis and do not recommend cholecystectomy at this time.  They have now signed off the case.  Pulmonary was also consulted for his right upper lobe groundglass opacity and they felt that this is likely aspiration or bacterial process.  They ordered an echocardiogram to rule out worsening MR and also ordered serologies to rule out connective tissue disease.  Patient remained in DKA so he is left on the insulin  drip for now.  Attempts were made to try and wean him off however his beta-hydroxybutyrate acid started trending up so this was discontinued canceled.  Patient is complaining of some facial swelling so we will obtain a maxillofacial CT scan and continue antibiotics with IV Zosyn  and doxycycline .  Nephrology was consulted for maintenance of hemodialysis and patient will be dialyzed yesterday with next session tomorrow.  His lab work is improving slowly but despite him having an elevated anion gap we will transition him off the insulin  drip to long-acting and monitor.  Will have the patient ambulate today.  Patient was transitioned off of insulin  drip and was to be dialyzed today but prior to dialysis he  decided he no longer want to be hospitalized and decided to leave the hospital AGAINST MEDICAL ADVICE under his own accord being awake and alert and oriented.  Assessment and Plan:  DKA without coma in DM 1: Beta hydroxybutyrate greater than 8 at presentation, following trend w/ ongoing insulin  tx.  Will continue insulin  drip for now and continue monitor beta-hydroxybutyrate trend.  Had trended down to 1.32 and was good to be transitioned however beta-hydroxybutyrate were back up to almost 3 so he was left on the insulin  drip. Beta-Hydroxybutryic Acid continues to be elevated but he is not having symptoms and has trended down to 1.56.  Will transition him off the insulin  drip as he was able to tolerate a soft diet and place him on a carb modified diet.  Will be transitioning him to 15 units of Semglee  daily and then placed on a very sensitive NovoLog /scale insulin  every 4h.  Given his diabetic management and improvement patient was possibly to be discharged later today or tomorrow but he signed out AMA prior to getting dialysis   Metabolic encephalopathy: Due to DKA - CT head without acute findings.  Mental status appears appropriate and much improved and he appears at Baseline   Aspiration pneumonia with possible sepsis POA: Patient was tachycardic and tachypneic at presentation but this of course could simply be due to his DKA -monitor for any further clinical evidence to support a diagnosis of pneumonia -empiric antibiotic for now with Doxy/Zosyn  - RVP negative - CoViD and Influenza negative -elevated procalcitonin not helpful in the setting of ESRD as it was 17.0 but nonetheless  he remains on IV antibiotics with doxycycline  and Zosyn  and it is improving now (4.58).  Given his facial swelling we will obtained a Maxillofacial CT as below.  WBC has normalized and is now 9.7  Facial swelling: Improving bilateral little bit worse on the left compared to the right.  ? if is related to periodontal disease.   Obtaining maxillofacial CT scan and showed Subcutaneous edema and stranding about the lower face and chin which was concerning for cellulitis but no abscess was seen. Continue Abx as above but he signed out AMA.    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 ECHO showed The mitral valve is normal in structure. Trivial mitral valve regurgitation. No evidence of mitral stenosis. Repeat CXR done and showed Mild cardiomegaly with interstitial edema. Small right pleural effusion with right basilar airspace opacities, possibly atelectasis or asymmetric pulmonary edema. A superimposed bronchopneumonia could also have this appearance in the correct clinical context.  Will do an amatory home O2 screen but he signed out AMA   Possible acute cholecystitis, ruled out: Ultrasound noted gallbladder wall thickening but no evidence of cholelithiasis -no Murphy's sign on ultrasound or physical exam - Gen Surgery following - HIDA scan showed filling of the gallbladder.   ESRD on HD TTS: Nephrology consulted to attend to ongoing dialysis. -BUN/Cr Trend: Recent Labs  Lab 03/28/24 0315 03/28/24 1322 03/28/24 1732 03/28/24 2209 03/29/24 0405 03/29/24 1628 03/30/24 0445  BUN 34* 44* 43* 47* 48* 55* 59*  CREATININE 4.41* 6.60* 6.52* 7.41* 7.20* 8.87* 9.62*  -Avoid Nephrotoxic Medications, Contrast Dyes, Hypotension and Dehydration to Ensure Adequate Renal Perfusion and will need to Renally Adjust Meds -Continue to Monitor and Trend Renal Function carefully and repeat CMP in the AM   HTN with Hypertrophic Cardiomyopathy: Patient is severely volume overloaded and blood pressures remain significantly elevated.  Continue as needed blood pressure medications and added IV labetalol . BP remains elevated but will resume home Amlodipine  10  mg po daily and Home Labetalol  100 mg po BID. CTM BP per Protocol. Last BP reading was improving and now 177/108  Volume Overload: In the setting of missed dialysis.  EF is 55 to 60% and patient does have indeterminate diastolic parameters.  Will need volume maintenance with hemodialysis and patient dialyzed yesterday and had 3.5 Liters removed.  Next dialysis session was this a.m. on 03/30/2024 but he signed out AMA   Elevated Troponin: In the setting of ESRD and DKA -no symptoms to suggest ACS -EKG without acute changes -elevated troponin likely due to poor clearance due to ESRD and demand ischemia - TTE pending - further eval will be required if patient develops chest pain or if there is a WMA on TTE    Anemia of Chronic Kidney Disease: Hgb/Hct trend:  Recent Labs  Lab 03/25/24 1806 03/25/24 1806 03/25/24 1820 03/26/24 0928 03/27/24 0930 03/27/24 2342 03/29/24 0405 03/30/24 0445  HGB 17.0  --  8.1* 7.4* 8.6* 7.9* 7.7* 8.2*  HCT 50.0  --  24.2* 22.6* 26.3* 23.7* 23.9* 24.5*  MCV  --    < > 94.2 96.6 97.4 94.8 98.4 94.6   < > = values in this interval not displayed.  -Checked Anemia panel and showed an iron level of 58, UIBC 123, TIBC 181, saturation ratio 32%, ferritin of 977, folate of 8.9, vitamin B12 2020 33.  Continue  monitor for signs of signs of bleeding; no overt bleeding noted. Repeat CBC in the AM - Repeat CBC in a.m.  Left eye Retinal Detachment and Vitreous Hemorrhage: Recently seen by the [facility] and around the retina clinic on 03/20/2024.  Continue with eyedrops with erythromycin  ophthalmic ointment and prednisone acetate 1% ophthalmic suspension with the taper as written in [facility]  GERD/GI Prophylaxis: Initiate PPI with pantoprazole  IV 40 mg every 12h for now  Hypoalbuminemia: Patient's Albumin  Lvl went from 4.0 -> 2.8 -> 3.2 -> 2.9 -> 3.0 it was 2.8.. CTM and Trend and repeat CMP in the AM

## 2024-03-27 NOTE — Progress Notes (Signed)
 [facility] Progress Note   Subjective:   Patient seen and examined in his room this morning. He was resting comfortably in his bed. His girlfriend, Elvie, was with him. She was wanting to know when his HD would be so she can go home to take care of their pets. HD scheduled for today. His BP has been very elevated during this visit. He is above his dry weight and received IVF for his diabetic acidosis.   Objective Vitals:   03/26/24 2321 03/27/24 0327 03/27/24 0400 03/27/24 0756  BP: (!) 208/89 (!) 229/111 (!) 188/104 (!) 214/122  Pulse:      Resp: 20 15 18 18   Temp: 98.5 F (36.9 C) 98.3 F (36.8 C)  98.5 F (36.9 C)  TempSrc: Oral Oral  Oral  SpO2: 99%   98%  Weight:      Height:       Exam Gen alert, no distress, looks a bit tired No rash, cyanosis or gangrene Sclera anicteric, throat clear  No jvd or bruits Chest clear bilat to bases, no rales/ wheezing RRR no MRG Abd soft ntnd no mass or ascites +bs GU deferred MS no joint effusions or deformity Ext trace LE edema, no other edema Neuro is lethargic, easy to awaken, nf     AVF+ bruit  Additional Objective Labs: Basic Metabolic Panel: Recent Labs  Lab 03/26/24 1910 03/27/24 0351 03/27/24 0930  NA 129* 131* 134*  K 4.0 3.9 4.3  CL 89* 89* 92*  CO2 21* 22 19*  GLUCOSE 396* 314* 241*  BUN 85* 88* 92*  CREATININE 7.99* 8.90* 9.58*  CALCIUM  7.4* 7.9* 8.1*  PHOS  --   --  7.0*   Liver Function Tests: Recent Labs  Lab 03/25/24 1820 03/26/24 0928 03/27/24 0930  AST 54* 71* 30  ALT 46* 90* 83*  ALKPHOS 136* 89 94  BILITOT 0.8 1.0 1.2  PROT 6.5 5.3* 6.1*  ALBUMIN  4.0 2.8* 3.2*   No results for input(s): LIPASE, AMYLASE in the last 168 hours. CBC: Recent Labs  Lab 03/25/24 1820 03/26/24 0928 03/27/24 0930  WBC 15.0* 11.8* 10.5  NEUTROABS  --   --  8.5*  HGB 8.1* 7.4* 8.6*  HCT 24.2* 22.6* 26.3*  MCV 94.2 96.6 97.4  PLT 295 320 333    CBG: Recent Labs  Lab 03/27/24 0902  03/27/24 1034 03/27/24 1142 03/27/24 1236 03/27/24 1328  GLUCAP 210* 170* 168* 182* 173*    Medications:  dextrose  50 mL/hr at 03/27/24 0602   doxycycline  (VIBRAMYCIN ) IV Stopped (03/27/24 9286)   insulin  2.4 Units/hr (03/27/24 1216)   piperacillin -tazobactam (ZOSYN )  IV Stopped (03/26/24 2116)   sodium chloride       atropine   1 drop Left Eye Daily   Chlorhexidine  Gluconate Cloth  6 each Topical Q0600   erythromycin    Left Eye QHS   pantoprazole  (PROTONIX ) IV  40 mg Intravenous Q12H   prednisoLONE  acetate  1 drop Left Eye Q8H   Followed by   [START ON 03/30/2024] prednisoLONE  acetate  1 drop Left Eye BID   Followed by   NOREEN ON 04/06/2024] prednisoLONE  acetate  1 drop Left Eye Daily   sodium chloride  flush  3 mL Intravenous Q12H   sodium chloride  flush  3 mL Intravenous Q12H    Home bp meds: Norvasc  10 every day (ran out) Labetalol  100 bid      OP HD: [facility] MWF 4h  B400   82.4kg  2K bath  AVF  Heparin  4000 Last OP HD 8/4, post wt 86.2kg (+3.8) Good compliance   Assessment/ Plan: DKA: doesn't need saline-containing IVFs since he is esrd and wt's are up. Have changed IVF's to just D5W 50 cc/hr.  Sepsis: possible PNA , r/o acute cholecystitis. Normal HIDA scan and has absence of gallstones. [facility] signed off.  ESRD: on HD MWF. Next HD today.  HTN: home meds on hold for now, BP's wnl range. Follow.  Volume: up 5-6kg by wts, mild edema LE's, mild congestion on xray. Plan UF 2-3 L w/ next HD.  Anemia of esrd: 7- 9, tranfuse prn, will follow.    Belvie Och, NP 03/27/2024, 1:37 PM  [facility]

## 2024-03-27 NOTE — Consult Note (Signed)
 NAME:  John [name], MRN:  [id number], DOB:  May 21, 1991, LOS: 1 ADMISSION DATE:  03/25/2024, CONSULTATION DATE:  8/5 REFERRING MD:  Dr. danton, CHIEF COMPLAINT:  pna; ?dah on ct  History of Present Illness:  Patient is a 33 year old male with pertinent PMH T1DM, ESRD on HD TTS, HTN, hypertrophic cardiomyopathy, chronic anemia presents to [facility] on 8/5 with DKA.  Patient came to med [facility] ED overnight on 8/4 for AMS.  Having episodes of vomiting.  Denies fever, chills, cough, abdominal pain.  Labs indicative of DKA.  Patient given IV fluids and started on insulin  drip.  CTA chest/abdomen/pelvis showing RUL patchy groundglass and centrally consolidative opacity; to a smaller extent seen in left anterior apex, BLL, RML; could represent infection/inflammation but alveolar hemorrhage not excluded.  Patient not having hemoptysis.  Hgb 8.1 and inr 1.5.  Patient afebrile and WBC 15.  Cultures obtained and given azithromycin  and Rocephin .  RVP negative.  PCCM consulted.   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  . has a past surgical history that includes Tonsillectomy; Insertion of dialysis catheter (Right, 10/25/2022); AV fistula placement (Left, 10/25/2022); CAPD insertion (N/A, 01/17/2023); Eye surgery (Right, 2024); CAPD removal (N/A, 05/12/2023); A/V Fistulagram (N/A, 10/06/2023); and PERIPHERAL VASCULAR BALLOON ANGIOPLASTY (Left,  10/06/2023).    Significant Hospital Events: Including procedures, antibiotic start and stop dates in addition to other pertinent events   8/5 admitted with DKA  Interim History / Subjective:   8/6 - Procal 17 and very c/w Sepsis WBC normal now but was 15L at admit. Afebrile since admit. On anibitocis. BHOB worse at 2.86, Bic 22 but gap  wors at 20 and with albumin  correctio this would be higher. He is some better today. Signigicant other inicated plan for HD and coming off insulin  by [facility]  Objective    Blood pressure (!) 214/122, pulse (!) 105, temperature 98.5 F (36.9 C), temperature source Oral, resp. rate 18, height 5' 10 (1.778 m), weight 88.1 kg, SpO2 98%.        Intake/Output Summary (Last 24 hours) at 03/27/2024 1049 Last data filed at 03/27/2024 0755 Gross per 24 hour  Intake 2346.48 ml  Output 175 ml  Net 2171.48 ml   Filed Weights   03/26/24 0238  Weight: 88.1 kg    Examination:  General Appearance:  Looks better Head:  Normocephalic, without obvious abnormality, atraumatic Eyes:  PERRL - yes, conjunctiva/corneas - mudd     Ears:  Normal external ear canals, both ears Nose:  G tube - no Throat:  ETT TUBE - no , OG tube - no Neck:  Supple,  No enlargement/tenderness/nodules Lungs: Clear to auscultation bilaterally, Ventilator   Synchrony - no Heart:  S1 and S2 normal, no murmur, CVP - no.  Pressors - no Abdomen:  Soft, no masses, no organomegaly Genitalia / Rectal:  Not done Extremities:  Extremities- no Skin:  ntact in exposed areas . Sacral area - no Neurologic:  Sedation - intact -> RASS - +! SABRA Moves all 4s - yes. CAM-ICU - neg . 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 Dorethia Cave, M.D., F.C.C.P,  Pulmonary and Critical Care Medicine Staff Physician, [facility] Center Director - Interstitial Lung Disease  Program  Pulmonary Fibrosis [facility] [place], [place], [address]   Pager: [phone], If no answer  -> Check AMION or Try [phone] Telephone (clinical office): [phone] Telephone (research): [phone]  10:50 AM 03/27/2024   LABS    PULMONARY Recent Labs  Lab 03/25/24 1806  HCO3 19.6*  TCO2 21*  O2SAT 89    CBC Recent Labs  Lab 03/25/24 1820 03/26/24 0928 03/27/24 0930  HGB 8.1* 7.4* 8.6*  HCT 24.2* 22.6* 26.3*  WBC 15.0* 11.8* 10.5  PLT 295 320 333    COAGULATION Recent Labs  Lab 03/26/24 0355  INR 1.5*    CARDIAC  No results for input(s): TROPONINI in the last 168 hours. No results for input(s): PROBNP in the last 168 hours.   CHEMISTRY Recent Labs  Lab 03/25/24 1820 03/26/24 0355 03/26/24 0928 03/26/24 1448 03/26/24 1910 03/27/24 0351  NA 139 137 139 137 129* 131*  K 4.9 4.3 4.6 5.3* 4.0 3.9  CL 86* 93* 98 97* 89* 89*  CO2 17* 22 24 21* 21* 22  GLUCOSE 439* 320* 585* 175* 396* 314*  BUN 56* 82* 75* 85* 85* 88*  CREATININE 6.14* 7.42* 7.00* 7.92* 7.99* 8.90*  CALCIUM  8.7* 7.9* 7.9* 7.9* 7.4* 7.9*  MG 2.4  --   --   --   --   --    Estimated Creatinine Clearance: 13.4 mL/min (A) (by C-G formula based on SCr of 8.9 mg/dL (H)).   LIVER Recent Labs  Lab 03/25/24 1820 03/26/24 0355 03/26/24 0928  AST 54*  --  71*  ALT 46*  --  90*  ALKPHOS 136*  --  89  BILITOT 0.8  --  1.0  PROT 6.5  --  5.3*  ALBUMIN  4.0  --  2.8*  INR  --  1.5*  --      INFECTIOUS Recent Labs  Lab  03/25/24 1820 03/25/24 2014 03/26/24 0355  LATICACIDVEN 2.7* 3.4*  --   PROCALCITON  --   --  17.00     ENDOCRINE CBG (last 3)  Recent Labs    03/27/24 0719 03/27/24 0902 03/27/24 1034  GLUCAP 126* 210* 170*         IMAGING x48h  - image(s) personally visualized  -   highlighted in bold NM Hepatobiliary Liver Func Result Date: 03/26/2024 CLINICAL DATA:  Abdominal pain, upper, chronic, assess gallbladder motility upper abdominal pain, previous imaging equivocal for cholecystitis EXAM: NUCLEAR MEDICINE HEPATOBILIARY IMAGING TECHNIQUE: Sequential images of the abdomen were obtained out to 60 minutes following intravenous administration of radiopharmaceutical. RADIOPHARMACEUTICALS:  5.0 mCi Tc-44m Choletec  IV. 1.0 mCi booster after 3 milligrams of  intravenous morphine  sulfate. COMPARISON:  Right upper quadrant ultrasound and abdominal CT 03/25/2024. FINDINGS: Initial images demonstrate prompt uptake by the liver with excretion into the biliary system and small bowel. Through 60 minutes of imaging, no definite gallbladder opacification identified. 3 milligrams of intravenous morphine  sulfate was administered with additional imaging for 30 minutes. After morphine , there is opacification of the gallbladder lumen. No evidence of bile leak. IMPRESSION: The cystic and common bile ducts are patent. No evidence of cholecystitis or bile leak. Electronically Signed   By: Elsie Perone M.D.   On: 03/26/2024 18:15   US  Abdomen Limited RUQ (LIVER/GB) Result Date: 03/25/2024 EXAM: Right Upper Quadrant Abdominal Ultrasound 03/25/2024 10:25:00 PM TECHNIQUE: Real-time ultrasonography of the right upper quadrant of the abdomen was performed. COMPARISON: None available. CLINICAL HISTORY: Vomiting; Abnormal CT scan. ICD-10 codes: R11.0, R93.8. FINDINGS: LIVER: The liver demonstrates normal echogenicity. No intrahepatic biliary ductal dilatation. No evidence of mass. BILIARY SYSTEM: There is pericholecystic  fluid and wall thickening of the gallbladder. No cholelithiasis. Negative sonographic Murphy's sign. Common bile duct is mildly dilated measuring 7 mm. RIGHT KIDNEY: The right kidney is grossly unremarkable in appearances without evidence of hydronephrosis, echogenic calculi or worrisome mass lesions. OTHER: No right upper quadrant ascites. IMPRESSION: 1. Gallbladder wall thickening and pericholecystic fluid without cholelithiasis or sonographic Murphy's sign. Findings are suggestive but not definitive for acute cholecystitis 2. Mildly dilated common bile duct measuring 7 mm. Electronically signed by: Norman Gatlin MD 03/25/2024 11:13 PM EDT RP Workstation: [id number]   CT CHEST ABDOMEN PELVIS W CONTRAST Result Date: 03/25/2024 CLINICAL DATA:  Sepsis concern for sepsis, abnl CXR, vomiting and abdominal pain POV from dialysis, per girlfriend pt appeared to seem to be confused during dialysis so they advised he come to ED. EXAM: CT CHEST, ABDOMEN, AND PELVIS WITH CONTRAST TECHNIQUE: Multidetector CT imaging of the chest, abdomen and pelvis was performed following the standard protocol during bolus administration of intravenous contrast. RADIATION DOSE REDUCTION: This exam was performed according to the departmental dose-optimization program which includes automated exposure control, adjustment of the mA and/or kV according to patient size and/or use of iterative reconstruction technique. CONTRAST:  80mL OMNIPAQUE  IOHEXOL  300 MG/ML  SOLN COMPARISON:  CT abdomen pelvis 10/21/2022, CT renal 03/16/2015 FINDINGS: CT CHEST FINDINGS Cardiovascular: Normal heart size. No significant pericardial effusion. The thoracic aorta is normal in caliber. No atherosclerotic plaque of the thoracic aorta. No coronary artery calcifications. Mediastinum/Nodes: No enlarged mediastinal, hilar, or axillary lymph nodes. Thyroid  gland, trachea, and esophagus demonstrate no significant findings. Lungs/Pleura: Right lower lobe passive  atelectasis. Right upper lobe peribronchovascular patchy ground-glass and centrally consolidative airspace opacities. T0 a smaller extent similar finding at the left anterior apex, bilateral lower lobes and right middle lobe. No pulmonary mass. Trace to small volume right pleural effusion. No pneumothorax. Musculoskeletal: Bilateral gynecomastia, right slightly greater than left. No suspicious lytic or blastic osseous lesions. No acute displaced fracture. CT ABDOMEN PELVIS FINDINGS Hepatobiliary: No focal liver abnormality. No gallstones, gallbladder wall thickening. Trace pericholecystic fluid. No biliary dilatation. Pancreas: No focal lesion. Normal pancreatic contour. No surrounding inflammatory changes. No main pancreatic ductal dilatation. Spleen: Normal in size without focal abnormality. Adrenals/Urinary Tract: No adrenal nodule bilaterally. Bilateral kidneys enhance symmetrically. No hydronephrosis. No hydroureter. The urinary bladder is unremarkable. Stomach/Bowel: Stomach is within normal limits. No evidence of bowel wall thickening or dilatation. No pneumatosis appendix appears normal. Vascular/Lymphatic: Inferior mesenteric artery stent with likely stenosis proximal to the stent at the origin that is poorly  visualized due to timing of contrast. Abdominal aorta or iliac aneurysm. No abdominal, pelvic, or inguinal lymphadenopathy. Reproductive: Prostate is unremarkable. Other: No intraperitoneal free fluid. No intraperitoneal free gas. No organized fluid collection. Musculoskeletal: No abdominal wall hernia or abnormality. No suspicious lytic or blastic osseous lesions. No acute displaced fracture. IMPRESSION: 1. Right upper lobe peribronchovascular patchy ground-glass and centrally consolidative airspace opacities. To a smaller extent similar findings at the left anterior apex, bilateral lower lobes and right middle lobe. Finding could represent infection/inflammation with alveolar hemorrhage not excluded.  2.  Trace to small volume right pleural effusion. 3. Nonspecific pericholecystic free fluid. Correlate with liver function tests and consider right upper quadrant ultrasound if clinically indicated. 4. 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 5. Bilateral gynecomastia, right slightly greater than left. Recommend correlation with physical exam. Electronically Signed   By: Morgane  Naveau M.D.   On: 03/25/2024 20:57   DG Chest 2 View Result Date: 03/25/2024 CLINICAL DATA:  Altered level of consciousness, end-stage renal disease. Fatigue. Vomiting. EXAM: CHEST - 2 VIEW COMPARISON:  10/25/2022 FINDINGS: Chronic cardiomegaly. Vascular congestion. Additional patchy opacity in the right suprahilar lung. Small left pleural effusion. No pneumothorax. No acute osseous findings. IMPRESSION: 1. Chronic cardiomegaly with vascular congestion. 2. Patchy opacity in the right suprahilar lung, may be atelectasis or pneumonia. 3. Small left pleural effusion. Electronically Signed   By: Andrea Gasman M.D.   On: 03/25/2024 18:09   CT Head Wo Contrast Result Date: 03/25/2024 CLINICAL DATA:  Mental status change, unknown cause ALOC, ESRD EXAM: CT HEAD WITHOUT CONTRAST TECHNIQUE: Contiguous axial images were obtained from the base of the skull through the vertex without intravenous contrast. RADIATION DOSE REDUCTION: This exam was performed according to the departmental dose-optimization program which includes automated exposure control, adjustment of the mA and/or kV according to patient size and/or use of iterative reconstruction technique. COMPARISON:  Remote CT 06/16/2009 FINDINGS: Brain: Segmental imaging due to motion. No intracranial hemorrhage, mass effect, or midline shift. No hydrocephalus. The basilar cisterns are patent. No evidence of territorial infarct or acute ischemia. No extra-axial or intracranial  fluid collection. Vascular: Atherosclerosis of skullbase vasculature without hyperdense vessel or abnormal calcification. Skull: No fracture or focal lesion. Sinuses/Orbits: Diffuse hyperdensity in the left lobe. Right cataract resection. Other: None. IMPRESSION: 1. No acute intracranial abnormality. 2. Diffuse hyperdensity in the left globe, may be related to prior surgery or trauma. Recommend correlation with clinical history. Electronically Signed   By: Andrea Gasman M.D.   On: 03/25/2024 18:07

## 2024-03-27 NOTE — Progress Notes (Signed)
 Pt receives out-pt HD at [facility] on MWF 10:25 am chair time. Will assist as needed.   Randine Mungo Dialysis Navigator [phone]

## 2024-03-27 NOTE — Progress Notes (Signed)
 Subjective: CC: Patient rates pain as 7/10 this AM. He stated that the primary locations for his discomfort are in the RUQ and epigastric regions with some radiation to his back. He said that it feels like heart burn. He still has nausea but no emesis. He endorsed having BM and flatus this morning.   Objective: Vital signs in last 24 hours: Temp:  [98 F (36.7 C)-98.5 F (36.9 C)] 98.3 F (36.8 C) (08/06 0327) Pulse Rate:  [100-105] 105 (08/05 1938) Resp:  [10-20] 18 (08/06 0400) BP: (161-229)/(88-111) 188/104 (08/06 0400) SpO2:  [90 %-100 %] 99 % (08/05 2321) Last BM Date : 03/25/24  Intake/Output from previous day: 08/05 0701 - 08/06 0700 In: 2090.2 [I.V.:1440.2; IV Piggyback:650] Out: -  Intake/Output this shift: Total I/O In: 256.2 [I.V.:6.2; IV Piggyback:250] Out: -   PE: Constitutional:      General: He is not in acute distress.    Appearance: He is ill-appearing.  Cardiovascular:     Rate and Rhythm: Tachycardia present.  Pulmonary:     Effort: Pulmonary effort is normal.  Abdominal:     Palpations: Abdomen is soft.     Tenderness: There is abdominal tenderness in the right upper quadrant and epigastric area. There is no guarding or rebound.     Hernia: No hernia is present.     Comments: Patient endorsed some pain radiating to his back.   Skin:    General: Skin is warm and dry.  Neurological:     General: No focal deficit present.     Mental Status: He is alert and oriented to person, place, and time.   Lab Results:  Recent Labs    03/25/24 1820 03/26/24 0928  WBC 15.0* 11.8*  HGB 8.1* 7.4*  HCT 24.2* 22.6*  PLT 295 320   BMET Recent Labs    03/26/24 1910 03/27/24 0351  NA 129* 131*  K 4.0 3.9  CL 89* 89*  CO2 21* 22  GLUCOSE 396* 314*  BUN 85* 88*  CREATININE 7.99* 8.90*  CALCIUM  7.4* 7.9*   PT/INR Recent Labs    03/26/24 0355  LABPROT 18.7*  INR 1.5*   CMP     Component Value Date/Time   NA 131 (L) 03/27/2024 0351   K  3.9 03/27/2024 0351   CL 89 (L) 03/27/2024 0351   CO2 22 03/27/2024 0351   GLUCOSE 314 (H) 03/27/2024 0351   BUN 88 (H) 03/27/2024 0351   CREATININE 8.90 (H) 03/27/2024 0351   CALCIUM  7.9 (L) 03/27/2024 0351   PROT 5.3 (L) 03/26/2024 0928   ALBUMIN  2.8 (L) 03/26/2024 0928   AST 71 (H) 03/26/2024 0928   ALT 90 (H) 03/26/2024 0928   ALKPHOS 89 03/26/2024 0928   BILITOT 1.0 03/26/2024 0928   GFRNONAA 7 (L) 03/27/2024 0351   GFRAA >60 03/02/2020 1012   Lipase     Component Value Date/Time   LIPASE 31 10/20/2022 1238    Studies/Results: NM Hepatobiliary Liver Func Result Date: 03/26/2024 CLINICAL DATA:  Abdominal pain, upper, chronic, assess gallbladder motility upper abdominal pain, previous imaging equivocal for cholecystitis EXAM: NUCLEAR MEDICINE HEPATOBILIARY IMAGING TECHNIQUE: Sequential images of the abdomen were obtained out to 60 minutes following intravenous administration of radiopharmaceutical. RADIOPHARMACEUTICALS:  5.0 mCi Tc-68m Choletec  IV. 1.0 mCi booster after 3 milligrams of intravenous morphine  sulfate. COMPARISON:  Right upper quadrant ultrasound and abdominal CT 03/25/2024. FINDINGS: Initial images demonstrate prompt uptake by the liver with excretion into the biliary  system and small bowel. Through 60 minutes of imaging, no definite gallbladder opacification identified. 3 milligrams of intravenous morphine  sulfate was administered with additional imaging for 30 minutes. After morphine , there is opacification of the gallbladder lumen. No evidence of bile leak. IMPRESSION: The cystic and common bile ducts are patent. No evidence of cholecystitis or bile leak. Electronically Signed   By: Elsie Perone M.D.   On: 03/26/2024 18:15   US  Abdomen Limited RUQ (LIVER/GB) Result Date: 03/25/2024 EXAM: Right Upper Quadrant Abdominal Ultrasound 03/25/2024 10:25:00 PM TECHNIQUE: Real-time ultrasonography of the right upper quadrant of the abdomen was performed. COMPARISON: None  available. CLINICAL HISTORY: Vomiting; Abnormal CT scan. ICD-10 codes: R11.0, R93.8. FINDINGS: LIVER: The liver demonstrates normal echogenicity. No intrahepatic biliary ductal dilatation. No evidence of mass. BILIARY SYSTEM: There is pericholecystic fluid and wall thickening of the gallbladder. No cholelithiasis. Negative sonographic Murphy's sign. Common bile duct is mildly dilated measuring 7 mm. RIGHT KIDNEY: The right kidney is grossly unremarkable in appearances without evidence of hydronephrosis, echogenic calculi or worrisome mass lesions. OTHER: No right upper quadrant ascites. IMPRESSION: 1. Gallbladder wall thickening and pericholecystic fluid without cholelithiasis or sonographic Murphy's sign. Findings are suggestive but not definitive for acute cholecystitis 2. Mildly dilated common bile duct measuring 7 mm. Electronically signed by: Norman Gatlin MD 03/25/2024 11:13 PM EDT RP Workstation: [id number]   CT CHEST ABDOMEN PELVIS W CONTRAST Result Date: 03/25/2024 CLINICAL DATA:  Sepsis concern for sepsis, abnl CXR, vomiting and abdominal pain POV from dialysis, per girlfriend pt appeared to seem to be confused during dialysis so they advised he come to ED. EXAM: CT CHEST, ABDOMEN, AND PELVIS WITH CONTRAST TECHNIQUE: Multidetector CT imaging of the chest, abdomen and pelvis was performed following the standard protocol during bolus administration of intravenous contrast. RADIATION DOSE REDUCTION: This exam was performed according to the departmental dose-optimization program which includes automated exposure control, adjustment of the mA and/or kV according to patient size and/or use of iterative reconstruction technique. CONTRAST:  80mL OMNIPAQUE  IOHEXOL  300 MG/ML  SOLN COMPARISON:  CT abdomen pelvis 10/21/2022, CT renal 03/16/2015 FINDINGS: CT CHEST FINDINGS Cardiovascular: Normal heart size. No significant pericardial effusion. The thoracic aorta is normal in caliber. No atherosclerotic plaque of the  thoracic aorta. No coronary artery calcifications. Mediastinum/Nodes: No enlarged mediastinal, hilar, or axillary lymph nodes. Thyroid  gland, trachea, and esophagus demonstrate no significant findings. Lungs/Pleura: Right lower lobe passive atelectasis. Right upper lobe peribronchovascular patchy ground-glass and centrally consolidative airspace opacities. T0 a smaller extent similar finding at the left anterior apex, bilateral lower lobes and right middle lobe. No pulmonary mass. Trace to small volume right pleural effusion. No pneumothorax. Musculoskeletal: Bilateral gynecomastia, right slightly greater than left. No suspicious lytic or blastic osseous lesions. No acute displaced fracture. CT ABDOMEN PELVIS FINDINGS Hepatobiliary: No focal liver abnormality. No gallstones, gallbladder wall thickening. Trace pericholecystic fluid. No biliary dilatation. Pancreas: No focal lesion. Normal pancreatic contour. No surrounding inflammatory changes. No main pancreatic ductal dilatation. Spleen: Normal in size without focal abnormality. Adrenals/Urinary Tract: No adrenal nodule bilaterally. Bilateral kidneys enhance symmetrically. No hydronephrosis. No hydroureter. The urinary bladder is unremarkable. Stomach/Bowel: Stomach is within normal limits. No evidence of bowel wall thickening or dilatation. No pneumatosis appendix appears normal. 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 Abdominal aorta or iliac aneurysm. No abdominal, pelvic, or inguinal lymphadenopathy. Reproductive: Prostate is unremarkable. Other: No intraperitoneal free fluid. No  intraperitoneal free gas. No organized fluid collection. Musculoskeletal: No abdominal wall hernia or abnormality. No suspicious lytic or blastic osseous lesions. No acute displaced fracture. IMPRESSION: 1. Right upper lobe peribronchovascular patchy ground-glass  and centrally consolidative airspace opacities. To a smaller extent similar findings at the left anterior apex, bilateral lower lobes and right middle lobe. Finding could represent infection/inflammation with alveolar hemorrhage not excluded. 2.  Trace to small volume right pleural effusion. 3. Nonspecific pericholecystic free fluid. Correlate with liver function tests and consider right upper quadrant ultrasound if clinically indicated. 4. 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 5. Bilateral gynecomastia, right slightly greater than left. Recommend correlation with physical exam. Electronically Signed   By: Morgane  Naveau M.D.   On: 03/25/2024 20:57   DG Chest 2 View Result Date: 03/25/2024 CLINICAL DATA:  Altered level of consciousness, end-stage renal disease. Fatigue. Vomiting. EXAM: CHEST - 2 VIEW COMPARISON:  10/25/2022 FINDINGS: Chronic cardiomegaly. Vascular congestion. Additional patchy opacity in the right suprahilar lung. Small left pleural effusion. No pneumothorax. No acute osseous findings. IMPRESSION: 1. Chronic cardiomegaly with vascular congestion. 2. Patchy opacity in the right suprahilar lung, may be atelectasis or pneumonia. 3. Small left pleural effusion. Electronically Signed   By: Andrea Gasman M.D.   On: 03/25/2024 18:09   CT Head Wo Contrast Result Date: 03/25/2024 CLINICAL DATA:  Mental status change, unknown cause ALOC, ESRD EXAM: CT HEAD WITHOUT CONTRAST TECHNIQUE: Contiguous axial images were obtained from the base of the skull through the vertex without intravenous contrast. RADIATION DOSE REDUCTION: This exam was performed according to the departmental dose-optimization program which includes automated exposure control, adjustment of the mA and/or kV according to patient size and/or use of iterative reconstruction technique. COMPARISON:  Remote CT 06/16/2009  FINDINGS: Brain: Segmental imaging due to motion. No intracranial hemorrhage, mass effect, or midline shift. No hydrocephalus. The basilar cisterns are patent. No evidence of territorial infarct or acute ischemia. No extra-axial or intracranial fluid collection. Vascular: Atherosclerosis of skullbase vasculature without hyperdense vessel or abnormal calcification. Skull: No fracture or focal lesion. Sinuses/Orbits: Diffuse hyperdensity in the left lobe. Right cataract resection. Other: None. IMPRESSION: 1. No acute intracranial abnormality. 2. Diffuse hyperdensity in the left globe, may be related to prior surgery or trauma. Recommend correlation with clinical history. Electronically Signed   By: Andrea Gasman M.D.   On: 03/25/2024 18:07    Anti-infectives: Anti-infectives (From admission, onward)    Start     Dose/Rate Route Frequency Ordered Stop   03/26/24 0400  doxycycline  (VIBRAMYCIN ) 100 mg in sodium chloride  0.9 % 250 mL IVPB        100 mg 125 mL/hr over 120 Minutes Intravenous Every 12 hours 03/26/24 0307 04/02/24 0344   03/26/24 0400  piperacillin -tazobactam (ZOSYN ) IVPB 2.25 g        2.25 g 100 mL/hr over 30 Minutes Intravenous Every 8 hours 03/26/24 0307     03/25/24 1930  cefTRIAXone  (ROCEPHIN ) 1 g in sodium chloride  0.9 % 100 mL IVPB        1 g 200 mL/hr over 30 Minutes Intravenous  Once 03/25/24 1917 03/25/24 2057   03/25/24 1930  azithromycin  (ZITHROMAX ) 500 mg in sodium chloride  0.9 % 250 mL IVPB        500 mg 250 mL/hr over 60 Minutes Intravenous  Once 03/25/24 1917 03/25/24 2241  Assessment/Plan Idiopathic RUQ and Epigastric Abdominal Pain.   Patient is a 33 yo male with T1DM and ESRD on HD, admitted with nausea/vomiting, diarrhea, weakness and DKA, as well as concern for pneumonia and cholecystitis. He does not have cholelithiasis, and his imaging findings are nonspecific. He remains non-tender in the RUQ on exam, but does endorse intermittent RUQ and epigastric  pain.   -HIDA scan is negative which makes it less likely that the patient's concerns are due to his gallbladder and so cholecystectomy is not needed at this time. Patient will need a GI consult for further work up.  -As stated, surgical intervention is not indicated at this time and we will sign off. Please reach out if other questions regarding surgical intervention arise.    -Pain control -Continue IV abx -Monitor CBC and CMP    FEN - NPO d/t continued nausea and dry heaving.  VTE - SCDs ID - [email or web address]   I reviewed nursing notes, hospitalist notes, last 24 h vitals and pain scores, last 48 h intake and output, last 24 h labs and trends, and last 24 h imaging results.     LOS: 1 day    Eulah Hammonds, [facility] 03/27/2024, 7:43 AM Please see Amion for pager number during day hours 7:00am-4:30pm

## 2024-03-27 NOTE — Progress Notes (Signed)
 PROGRESS NOTE    John [name]  FMW:[id number] DOB: [birth year] DOA: 03/25/2024 PCP: Beryl Donnice BRAVO, MD   Brief Narrative:  The patient is a 33 year old with a history of DM type I, HTN, hypertrophic cardiomyopathy, bilateral retinal detachment causing blindness, chronic anemia, and ESRD on HD TTS who was sent to the ER 8/4 PM from his dialysis unit where he was found to be confused following several preceding days notable for loss of appetite with multiple episodes of vomiting. Workup in the ER included CT chest and abdomen which suggested a possible pulmonary infiltrate. Abdominal ultrasound suggested gallbladder wall thickening but no Murphy sign. CT head was unrevealing.   General surgery evaluated and his HIDA scan showed filling of the gallbladder and this is not consistent with cholecystitis.  There is no gallstones noted and no right upper quadrant tenderness.  Surgery team did not feel he had an acute cholecystitis and do not recommend cholecystectomy at this time.  They have now signed off the case.  Pulmonary was also consulted for his right upper lobe groundglass opacity and they felt that this is likely aspiration or bacterial process.  They ordered an echocardiogram to rule out worsening MR and also ordered serologies to rule out connective tissue disease.  Patient remained in DKA so he is left on the insulin  drip for now.  Attempts were made to try and wean him off however his beta-hydroxybutyrate acid started trending up so this was discontinued canceled.  Patient is complaining of some facial swelling so we will obtain a maxillofacial CT scan and continue antibiotics with IV Zosyn  and doxycycline .  Nephrology was consulted for maintenance of hemodialysis and patient will be dialyzed in the room later today.  Assessment and Plan:  DKA without coma in DM 1: Beta hydroxybutyrate greater than 8 at presentation, following trend w/ ongoing insulin  tx.  Will continue insulin  drip for  now and continue monitor beta-hydroxybutyrate trend.  Had trended down to 1.32 and was good to be transitioned however beta-hydroxybutyrate were back up to almost 3 so he was left on the insulin  drip.  Was given IV fluids however nephrology changed to D5W.   Metabolic encephalopathy: Due to DKA - CT head without acute findings.  Mental status appears appropriate   Possible pneumonia with possible sepsis POA: Patient was tachycardic and tachypneic at presentation but this of course could simply be due to his DKA -monitor for any further clinical evidence to support a diagnosis of pneumonia -empiric antibiotic for now - RVP negative - CoViD and Influenza negative -elevated procalcitonin not helpful in the setting of ESRD as it was 17.0 but nonetheless he remains on IV antibiotics with doxycycline  and Zosyn .  Given his facial swelling we will obtain a maxillofacial CT scan.  WBC has normalized and is now 10.5  Facial swelling: Bilateral little bit worse on the left compared to the right.  Question if is related to periodontal disease.  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  Will repeat chest x-ray in AM.   Possible acute cholecystitis, ruled out: Ultrasound noted gallbladder wall thickening but no evidence of cholelithiasis -no Murphy's sign on ultrasound or physical exam - Gen Surgery following - HIDA scan showed filling of the gallbladder.   ESRD on HD TTS: Nephrology consulted to  attend to ongoing dialysis. -BUN/Cr Trend: Recent Labs  Lab 03/26/24 0355 03/26/24 0928 03/26/24 1448 03/26/24 1910 03/27/24 0351 03/27/24 0930 03/27/24 1153  BUN 82* 75* 85* 85* 88* 92* 97*  CREATININE 7.42* 7.00* 7.92* 7.99* 8.90* 9.58* 9.78*  -Avoid  Nephrotoxic Medications, Contrast Dyes, Hypotension and Dehydration to Ensure Adequate Renal Perfusion and will need to Renally Adjust Meds -Continue to Monitor and Trend Renal Function carefully and repeat CMP in the AM   HTN with hypertrophic cardiomyopathy: Patient is severely volume overloaded and blood pressures remain significantly elevated.  Continue as needed blood pressure medications and added IV labetalol .  Volume overload: In the setting of missed dialysis.  EF is 55 to 60% and patient does have indeterminate diastolic parameters.  Will need volume maintenance with hemodialysis and patient was to be dialyzed today   Elevated troponin: In the setting of ESRD and DKA -no symptoms to suggest ACS -EKG without acute changes -elevated troponin likely due to poor clearance due to ESRD and demand ischemia - TTE pending - further eval will be required if patient develops chest pain or if there is a WMA on TTE    Anemia of chronic kidney disease: Hgb/Hct trend:  Recent Labs  Lab 03/25/24 1806 03/25/24 1806 03/25/24 1820 03/26/24 0928 03/27/24 0930  HGB 17.0  --  8.1* 7.4* 8.6*  HCT 50.0  --  24.2* 22.6* 26.3*  MCV  --    < > 94.2 96.6 97.4   < > = values in this interval not displayed.  - Check anemia panel in AM.  Continue monitor for signs of signs of bleeding; no overt bleeding noted. Repeat CBC in the AM - CBC in a.m.  Left eye retinal hemorrhages: Continue with eyedrops with erythromycin  ophthalmic ointment and prednisone acetate 1% ophthalmic suspension with the taper as noted  GERD/GI prophylaxis: Initiate PPI with pantoprazole  IV 40 mg every 12  Hypoalbuminemia: Patient's Albumin  Lvl went from 4.0 -> 2.8 -> 3.2. CTM and Trend and repeat CMP in the AM   DVT prophylaxis: SCDs Start: 03/26/24 0308 Place TED hose Start: 03/26/24 0308    Code Status: Full Code Family Communication: Discussed with significant other at bedside  Disposition Plan:  Level of care:  Progressive Status is: Inpatient Remains inpatient appropriate because: His further clinical improvement clearance by the specialist   Consultants:  Nephrology PCCM/pulmonary General Surgery  Procedures:  As delineated as above  Antimicrobials:  Anti-infectives (From admission, onward)    Start     Dose/Rate Route Frequency Ordered Stop   03/26/24 0400  doxycycline  (VIBRAMYCIN ) 100 mg in sodium chloride  0.9 % 250 mL IVPB        100 mg 125 mL/hr over 120 Minutes Intravenous Every 12 hours 03/26/24 0307 04/02/24 0344   03/26/24 0400  piperacillin -tazobactam (ZOSYN ) IVPB 2.25 g        2.25 g 100 mL/hr over 30 Minutes Intravenous Every 8 hours 03/26/24 0307     03/25/24 1930  cefTRIAXone  (ROCEPHIN ) 1 g in sodium chloride  0.9 % 100 mL IVPB        1 g 200 mL/hr over 30 Minutes Intravenous  Once 03/25/24 1917 03/25/24 2057   03/25/24 1930  azithromycin  (ZITHROMAX ) 500 mg in sodium chloride  0.9 % 250 mL IVPB        500 mg 250 mL/hr over 60 Minutes Intravenous  Once 03/25/24 1917 03/25/24 2241       Subjective: Seen and examined at bedside and states that he is  having some back pain he was feeling some nausea.  No new lightheadedness or dizziness.  Was not feeling as well.  No other concerns or complaints at this time.  Objective: Vitals:   03/27/24 0400 03/27/24 0756 03/27/24 1604 03/27/24 1935  BP: (!) 188/104 (!) 214/122 (!) 194/120 (!) 198/118  Pulse:    (!) 109  Resp: 18 18 16 20   Temp:  98.5 F (36.9 C) 98.3 F (36.8 C) 99.1 F (37.3 C)  TempSrc:  Oral Oral Oral  SpO2:  98% 97% 95%  Weight:      Height:        Intake/Output Summary (Last 24 hours) at 03/27/2024 2014 Last data filed at 03/27/2024 9244 Gross per 24 hour  Intake 832.94 ml  Output 175 ml  Net 657.94 ml   Filed Weights   03/26/24 0238  Weight: 88.1 kg   Examination: Physical Exam:  Constitutional: Chronically ill-appearing young Caucasian male who appears uncomfortable Respiratory: Diminished to  auscultation bilaterally with some coarse breath sounds, no wheezing, rales, rhonchi or crackles. Normal respiratory effort and patient is not tachypenic. No accessory muscle use.  Unlabored breathing Cardiovascular: RRR, no murmurs / rubs / gallops. S1 and S2 auscultated.  1+ lower extremity edema Abdomen: Soft, non-tender, distended secondary to body habitus.. Bowel sounds positive.  GU: Deferred. Musculoskeletal: No clubbing / cyanosis of digits/nails. No joint deformity upper and lower extremities.  Has some facial swelling and some tenderness on his left jaw Skin: No rashes, lesions, ulcers limited skin evaluation. No induration; Warm and dry.  Neurologic: Has retinal hemorrhage Psychiatric: Awake and alert and appears a little uncomfortable  Data Reviewed: I have personally reviewed following labs and imaging studies  CBC: Recent Labs  Lab 03/25/24 1806 03/25/24 1820 03/26/24 0928 03/27/24 0930  WBC  --  15.0* 11.8* 10.5  NEUTROABS  --   --   --  8.5*  HGB 17.0 8.1* 7.4* 8.6*  HCT 50.0 24.2* 22.6* 26.3*  MCV  --  94.2 96.6 97.4  PLT  --  295 320 333   Basic Metabolic Panel: Recent Labs  Lab 03/25/24 1820 03/26/24 0355 03/26/24 1448 03/26/24 1910 03/27/24 0351 03/27/24 0930 03/27/24 1153  NA 139   < > 137 129* 131* 134* 133*  K 4.9   < > 5.3* 4.0 3.9 4.3 4.4  CL 86*   < > 97* 89* 89* 92* 92*  CO2 17*   < > 21* 21* 22 19* 19*  GLUCOSE 439*   < > 175* 396* 314* 241* 170*  BUN 56*   < > 85* 85* 88* 92* 97*  CREATININE 6.14*   < > 7.92* 7.99* 8.90* 9.58* 9.78*  CALCIUM  8.7*   < > 7.9* 7.4* 7.9* 8.1* 8.3*  MG 2.4  --   --   --   --  2.3  --   PHOS  --   --   --   --   --  7.0*  --    < > = values in this interval not displayed.   GFR: Estimated Creatinine Clearance: 12.2 mL/min (A) (by C-G formula based on SCr of 9.78 mg/dL (H)). Liver Function Tests: Recent Labs  Lab 03/25/24 1820 03/26/24 0928 03/27/24 0930  AST 54* 71* 30  ALT 46* 90* 83*  ALKPHOS 136* 89  94  BILITOT 0.8 1.0 1.2  PROT 6.5 5.3* 6.1*  ALBUMIN  4.0 2.8* 3.2*   No results for input(s): LIPASE, AMYLASE in the last 168  hours. No results for input(s): AMMONIA in the last 168 hours. Coagulation Profile: Recent Labs  Lab 03/26/24 0355  INR 1.5*   Cardiac Enzymes: No results for input(s): CKTOTAL, CKMB, CKMBINDEX, TROPONINI in the last 168 hours. BNP (last 3 results) No results for input(s): PROBNP in the last 8760 hours. HbA1C: No results for input(s): HGBA1C in the last 72 hours. CBG: Recent Labs  Lab 03/27/24 1328 03/27/24 1435 03/27/24 1603 03/27/24 1804 03/27/24 1851  GLUCAP 173* 178* 160* 163* 147*   Lipid Profile: No results for input(s): CHOL, HDL, LDLCALC, TRIG, CHOLHDL, LDLDIRECT in the last 72 hours. Thyroid  Function Tests: No results for input(s): TSH, T4TOTAL, FREET4, T3FREE, THYROIDAB in the last 72 hours. Anemia Panel: No results for input(s): VITAMINB12, FOLATE, FERRITIN, TIBC, IRON, RETICCTPCT in the last 72 hours. Sepsis Labs: Recent Labs  Lab 03/25/24 1820 03/25/24 2014 03/26/24 0355  PROCALCITON  --   --  17.00  LATICACIDVEN 2.7* 3.4*  --     Recent Results (from the past 240 hours)  Blood culture (routine x 2)     Status: None (Preliminary result)   Collection Time: 03/25/24  6:17 PM   Specimen: Right Antecubital; Blood  Result Value Ref Range Status   Specimen Description   Final    RIGHT ANTECUBITAL Performed at [facility], [address] [place], [place] [address]    Special Requests   Final    BOTTLES DRAWN AEROBIC AND ANAEROBIC Blood Culture adequate volume Performed at [facility], [address] [place], [place] [address]    Culture   Final    NO GROWTH < 24 HOURS Performed at [facility], [address], [place], [place] [address]    Report Status PENDING  Incomplete  Resp panel by RT-PCR (RSV, Flu A&B, Covid) Anterior  Nasal Swab     Status: None   Collection Time: 03/25/24  8:14 PM   Specimen: Anterior Nasal Swab  Result Value Ref Range Status   SARS Coronavirus 2 by RT PCR NEGATIVE NEGATIVE Final    Comment: (NOTE) SARS-CoV-2 target nucleic acids are NOT DETECTED.  The SARS-CoV-2 RNA is generally detectable in upper respiratory specimens during the acute phase of infection. The lowest concentration of SARS-CoV-2 viral copies this assay can detect is 138 copies/mL. A negative result does not preclude SARS-Cov-2 infection and should not be used as the sole basis for treatment or other patient management decisions. A negative result may occur with  improper specimen collection/handling, submission of specimen other than nasopharyngeal swab, presence of viral mutation(s) within the areas targeted by this assay, and inadequate number of viral copies(<138 copies/mL). A negative result must be combined with clinical observations, patient history, and epidemiological information. The expected result is Negative.  Fact Sheet for Patients:  BloggerCourse.com  Fact Sheet for Healthcare Providers:  SeriousBroker.it  This test is no t yet approved or cleared by the [place]  FDA and  has been authorized for detection and/or diagnosis of SARS-CoV-2 by FDA under an Emergency Use Authorization (EUA). This EUA will remain  in effect (meaning this test can be used) for the duration of the COVID-19 declaration under Section 564(b)(1) of the Act, 21 U.S.C.section 360bbb-3(b)(1), unless the authorization is terminated  or revoked sooner.       Influenza A by PCR NEGATIVE NEGATIVE Final   Influenza B by PCR NEGATIVE NEGATIVE Final    Comment: (NOTE) The Xpert Xpress SARS-CoV-2/FLU/RSV plus assay is intended as an aid in  the diagnosis of influenza from Nasopharyngeal swab specimens and should not be used as a sole basis for treatment. Nasal washings  and aspirates are unacceptable for Xpert Xpress SARS-CoV-2/FLU/RSV testing.  Fact Sheet for Patients: BloggerCourse.com  Fact Sheet for Healthcare Providers: SeriousBroker.it  This test is not yet approved or cleared by the [place]  FDA and has been authorized for detection and/or diagnosis of SARS-CoV-2 by FDA under an Emergency Use Authorization (EUA). This EUA will remain in effect (meaning this test can be used) for the duration of the COVID-19 declaration under Section 564(b)(1) of the Act, 21 U.S.C. section 360bbb-3(b)(1), unless the authorization is terminated or revoked.     Resp Syncytial Virus by PCR NEGATIVE NEGATIVE Final    Comment: (NOTE) Fact Sheet for Patients: BloggerCourse.com  Fact Sheet for Healthcare Providers: SeriousBroker.it  This test is not yet approved or cleared by the [place]  FDA and has been authorized for detection and/or diagnosis of SARS-CoV-2 by FDA under an Emergency Use Authorization (EUA). This EUA will remain in effect (meaning this test can be used) for the duration of the COVID-19 declaration under Section 564(b)(1) of the Act, 21 U.S.C. section 360bbb-3(b)(1), unless the authorization is terminated or revoked.  Performed at [facility], [address] [place], [place] [address]   Blood culture (routine x 2)     Status: None (Preliminary result)   Collection Time: 03/25/24  9:14 PM   Specimen: BLOOD RIGHT HAND  Result Value Ref Range Status   Specimen Description   Final    BLOOD RIGHT HAND Performed at [facility], [address] [place], [place] [address]    Special Requests   Final    BOTTLES DRAWN AEROBIC AND ANAEROBIC Blood Culture results may not be optimal due to an inadequate volume of blood received in culture bottles Performed at [facility], [address], [place], [place] [address]    Culture   Final    NO GROWTH < 24 HOURS Performed at [facility], [address] [place], [place] [address]    Report Status PENDING  Incomplete  Respiratory (~20 pathogens) panel by PCR     Status: None   Collection Time: 03/26/24  3:56 AM   Specimen: Nasopharyngeal Swab; Respiratory  Result Value Ref Range Status   Adenovirus NOT DETECTED NOT DETECTED Final   Coronavirus 229E NOT DETECTED NOT DETECTED Final    Comment: (NOTE) The Coronavirus on the Respiratory Panel, DOES NOT test for the novel  Coronavirus (2019 nCoV)    Coronavirus HKU1 NOT DETECTED NOT DETECTED Final   Coronavirus NL63 NOT DETECTED NOT DETECTED Final   Coronavirus OC43 NOT DETECTED NOT DETECTED Final   Metapneumovirus NOT DETECTED NOT DETECTED Final   Rhinovirus / Enterovirus NOT DETECTED NOT DETECTED Final   Influenza A NOT DETECTED NOT DETECTED Final   Influenza B NOT DETECTED NOT DETECTED Final   Parainfluenza Virus 1 NOT DETECTED NOT DETECTED Final   Parainfluenza Virus 2 NOT DETECTED NOT DETECTED Final   Parainfluenza Virus 3 NOT DETECTED NOT DETECTED Final   Parainfluenza Virus 4 NOT DETECTED NOT DETECTED Final   Respiratory Syncytial Virus NOT DETECTED NOT DETECTED Final   Bordetella pertussis NOT DETECTED NOT DETECTED Final   Bordetella Parapertussis NOT DETECTED NOT DETECTED Final   Chlamydophila pneumoniae NOT DETECTED NOT DETECTED Final   Mycoplasma pneumoniae NOT DETECTED NOT DETECTED Final    Comment: Performed at [facility], [address],  [place], [place] [address]     Radiology Studies: ECHOCARDIOGRAM COMPLETE Result Date: 03/27/2024    ECHOCARDIOGRAM REPORT   Patient Name:   JOSEPHMICHAEL LISENBEE Date of Exam: 03/27/2024 Medical Rec #:  [id number]       Height:       70.0 in Accession #:    [id number]      Weight:       194.2 lb Date of Birth:  03-Oct-1990       BSA:          2.061 m Patient Age:    33 years        BP:           188/104 mmHg  Patient Gender: M               HR:           107 bpm. Exam Location:  Inpatient Procedure: 2D Echo, Cardiac Doppler and Color Doppler (Both Spectral and Color            Flow Doppler were utilized during procedure). Indications:    Elevated Troponin  History:        Patient has no prior history of Echocardiogram examinations.                 Risk Factors:Hypertension and Diabetes.  Sonographer:    Jayson Gaskins Referring Phys: [id number] SUBRINA SUNDIL IMPRESSIONS  1. Left ventricular ejection fraction, by estimation, is 55 to 60%. The left ventricle has normal function. The left ventricle has no regional wall motion abnormalities. There is moderate left ventricular hypertrophy. Left ventricular diastolic parameters are indeterminate.  2. Right ventricular systolic function is normal. The right ventricular size is normal. Tricuspid regurgitation signal is inadequate for assessing PA pressure.  3. A small pericardial effusion is present.  4. The mitral valve is normal in structure. Trivial mitral valve regurgitation. No evidence of mitral stenosis.  5. The aortic valve is grossly normal. Aortic valve regurgitation is not visualized. No aortic stenosis is present.  6. The inferior vena cava is dilated in size with >50% respiratory variability, suggesting right atrial pressure of 8 mmHg. FINDINGS  Left Ventricle: Left ventricular ejection fraction, by estimation, is 55 to 60%. The left ventricle has normal function. The left ventricle has no regional wall motion abnormalities. The left ventricular internal cavity size was normal in size. There is  moderate left ventricular hypertrophy. Left ventricular diastolic parameters are indeterminate. Right Ventricle: The right ventricular size is normal. No increase in right ventricular wall thickness. Right ventricular systolic function is normal. Tricuspid regurgitation signal is inadequate for assessing PA pressure. Left Atrium: Left atrial size was normal in size. Right  Atrium: Right atrial size was normal in size. Pericardium: A small pericardial effusion is present. Mitral Valve: The mitral valve is normal in structure. Trivial mitral valve regurgitation. No evidence of mitral valve stenosis. Tricuspid Valve: The tricuspid valve is normal in structure. Tricuspid valve regurgitation is mild . No evidence of tricuspid stenosis. Aortic Valve: The aortic valve is grossly normal. Aortic valve regurgitation is not visualized. No aortic stenosis is present. Aortic valve mean gradient measures 5.0 mmHg. Aortic valve peak gradient measures 8.0 mmHg. Aortic valve area, by VTI measures 2.32 cm. Pulmonic Valve: The pulmonic valve was normal in structure. Pulmonic valve regurgitation is mild. No evidence of pulmonic stenosis. Aorta: The aortic root is normal in size and structure. Venous: The inferior vena cava is dilated in size with greater than 50%  respiratory variability, suggesting right atrial pressure of 8 mmHg. IAS/Shunts: The interatrial septum was not well visualized.  LEFT VENTRICLE PLAX 2D LVIDd:         4.90 cm LVIDs:         3.50 cm LV PW:         1.50 cm LV IVS:        1.50 cm LVOT diam:     1.90 cm LV SV:         58 LV SV Index:   28 LVOT Area:     2.84 cm  RIGHT VENTRICLE RV S prime:     10.20 cm/s TAPSE (M-mode): 2.2 cm LEFT ATRIUM             Index        RIGHT ATRIUM           Index LA Vol (A2C):   46.0 ml 22.32 ml/m  RA Area:     17.00 cm LA Vol (A4C):   53.3 ml 25.86 ml/m  RA Volume:   38.10 ml  18.48 ml/m LA Biplane Vol: 52.0 ml 25.23 ml/m  AORTIC VALVE AV Area (Vmax):    2.27 cm AV Area (Vmean):   2.39 cm AV Area (VTI):     2.32 cm AV Vmax:           141.00 cm/s AV Vmean:          107.000 cm/s AV VTI:            0.252 m AV Peak Grad:      8.0 mmHg AV Mean Grad:      5.0 mmHg LVOT Vmax:         113.00 cm/s LVOT Vmean:        90.300 cm/s LVOT VTI:          0.206 m LVOT/AV VTI ratio: 0.82  AORTA Ao Root diam: 2.60 cm MITRAL VALVE MV Area (PHT): 3.60 cm      SHUNTS MV Decel Time: 211 msec     Systemic VTI:  0.21 m MV E velocity: 144.00 cm/s  Systemic Diam: 1.90 cm Soyla Merck MD Electronically signed by Soyla Merck MD Signature Date/Time: 03/27/2024/3:02:05 PM    Final    NM Hepatobiliary Liver Func Result Date: 03/26/2024 CLINICAL DATA:  Abdominal pain, upper, chronic, assess gallbladder motility upper abdominal pain, previous imaging equivocal for cholecystitis EXAM: NUCLEAR MEDICINE HEPATOBILIARY IMAGING TECHNIQUE: Sequential images of the abdomen were obtained out to 60 minutes following intravenous administration of radiopharmaceutical. RADIOPHARMACEUTICALS:  5.0 mCi Tc-84m Choletec  IV. 1.0 mCi booster after 3 milligrams of intravenous morphine  sulfate. COMPARISON:  Right upper quadrant ultrasound and abdominal CT 03/25/2024. FINDINGS: Initial images demonstrate prompt uptake by the liver with excretion into the biliary system and small bowel. Through 60 minutes of imaging, no definite gallbladder opacification identified. 3 milligrams of intravenous morphine  sulfate was administered with additional imaging for 30 minutes. After morphine , there is opacification of the gallbladder lumen. No evidence of bile leak. IMPRESSION: The cystic and common bile ducts are patent. No evidence of cholecystitis or bile leak. Electronically Signed   By: Elsie Perone M.D.   On: 03/26/2024 18:15   US  Abdomen Limited RUQ (LIVER/GB) Result Date: 03/25/2024 EXAM: Right Upper Quadrant Abdominal Ultrasound 03/25/2024 10:25:00 PM TECHNIQUE: Real-time ultrasonography of the right upper quadrant of the abdomen was performed. COMPARISON: None available. CLINICAL HISTORY: Vomiting; Abnormal CT scan. ICD-10 codes: R11.0, R93.8. FINDINGS: LIVER: The liver demonstrates normal echogenicity.  No intrahepatic biliary ductal dilatation. No evidence of mass. BILIARY SYSTEM: There is pericholecystic fluid and wall thickening of the gallbladder. No cholelithiasis. Negative sonographic  Murphy's sign. Common bile duct is mildly dilated measuring 7 mm. RIGHT KIDNEY: The right kidney is grossly unremarkable in appearances without evidence of hydronephrosis, echogenic calculi or worrisome mass lesions. OTHER: No right upper quadrant ascites. IMPRESSION: 1. Gallbladder wall thickening and pericholecystic fluid without cholelithiasis or sonographic Murphy's sign. Findings are suggestive but not definitive for acute cholecystitis 2. Mildly dilated common bile duct measuring 7 mm. Electronically signed by: Norman Gatlin MD 03/25/2024 11:13 PM EDT RP Workstation: [id number]   Scheduled Meds:  atropine   1 drop Left Eye Daily   Chlorhexidine  Gluconate Cloth  6 each Topical Q0600   erythromycin    Left Eye QHS   pantoprazole  (PROTONIX ) IV  40 mg Intravenous Q12H   prednisoLONE  acetate  1 drop Left Eye Q8H   Followed by   [START ON 03/30/2024] prednisoLONE  acetate  1 drop Left Eye BID   Followed by   NOREEN ON 04/06/2024] prednisoLONE  acetate  1 drop Left Eye Daily   sodium chloride  flush  3 mL Intravenous Q12H   sodium chloride  flush  3 mL Intravenous Q12H   Continuous Infusions:  doxycycline  (VIBRAMYCIN ) IV 100 mg (03/27/24 1650)   insulin  1.1 Units/hr (03/27/24 1853)   piperacillin -tazobactam (ZOSYN )  IV 2.25 g (03/27/24 1455)    LOS: 1 day   Alejandro Marker, DO [facility] Available via Epic secure chat 7am-7pm After these hours, please refer to coverage provider listed on [email or web address] 03/27/2024, 8:14 PM

## 2024-03-27 NOTE — Plan of Care (Signed)
  Problem: Education: Goal: Knowledge of General Education information will improve Description: Including pain rating scale, medication(s)/side effects and non-pharmacologic comfort measures Outcome: Progressing   Problem: Clinical Measurements: Goal: Ability to maintain clinical measurements within normal limits will improve Outcome: Progressing Goal: Diagnostic test results will improve Outcome: Progressing Goal: Respiratory complications will improve Outcome: Progressing Goal: Cardiovascular complication will be avoided Outcome: Progressing   Problem: Activity: Goal: Risk for activity intolerance will decrease Outcome: Progressing   Problem: Nutrition: Goal: Adequate nutrition will be maintained Outcome: Progressing   Problem: Elimination: Goal: Will not experience complications related to bowel motility Outcome: Progressing Goal: Will not experience complications related to urinary retention Outcome: Progressing   Problem: Pain Managment: Goal: General experience of comfort will improve and/or be controlled Outcome: Progressing   Problem: Safety: Goal: Ability to remain free from injury will improve Outcome: Progressing   Problem: Skin Integrity: Goal: Risk for impaired skin integrity will decrease Outcome: Progressing   Problem: Activity: Goal: Ability to tolerate increased activity will improve Outcome: Progressing   Problem: Clinical Measurements: Goal: Ability to maintain a body temperature in the normal range will improve Outcome: Progressing   Problem: Respiratory: Goal: Ability to maintain adequate ventilation will improve Outcome: Progressing Goal: Ability to maintain a clear airway will improve Outcome: Progressing   Problem: Coping: Goal: Ability to adjust to condition or change in health will improve Outcome: Progressing   Problem: Fluid Volume: Goal: Ability to maintain a balanced intake and output will improve Outcome: Progressing    Problem: Metabolic: Goal: Ability to maintain appropriate glucose levels will improve Outcome: Progressing   Problem: Nutritional: Goal: Maintenance of adequate nutrition will improve Outcome: Progressing   Problem: Skin Integrity: Goal: Risk for impaired skin integrity will decrease Outcome: Progressing   Problem: Tissue Perfusion: Goal: Adequacy of tissue perfusion will improve Outcome: Progressing

## 2024-03-28 ENCOUNTER — Inpatient Hospital Stay (HOSPITAL_COMMUNITY)

## 2024-03-28 DIAGNOSIS — R918 Other nonspecific abnormal finding of lung field: Secondary | ICD-10-CM | POA: Diagnosis not present

## 2024-03-28 DIAGNOSIS — A419 Sepsis, unspecified organism: Secondary | ICD-10-CM | POA: Diagnosis not present

## 2024-03-28 DIAGNOSIS — I10 Essential (primary) hypertension: Secondary | ICD-10-CM | POA: Diagnosis not present

## 2024-03-28 DIAGNOSIS — D649 Anemia, unspecified: Secondary | ICD-10-CM | POA: Diagnosis not present

## 2024-03-28 HISTORY — PX: IR TUNNELED CENTRAL VENOUS CATH PLC W IMG: IMG1939

## 2024-03-28 LAB — ANCA TITERS
Atypical P-ANCA titer: <1:20 {titer}
C-ANCA: <1:20 {titer}
P-ANCA: <1:20 {titer}

## 2024-03-28 LAB — GLUCOSE, CAPILLARY
Glucose-Capillary: 114 mg/dL — ABNORMAL HIGH (ref 70–99)
Glucose-Capillary: 118 mg/dL — ABNORMAL HIGH (ref 70–99)
Glucose-Capillary: 126 mg/dL — ABNORMAL HIGH (ref 70–99)
Glucose-Capillary: 128 mg/dL — ABNORMAL HIGH (ref 70–99)
Glucose-Capillary: 140 mg/dL — ABNORMAL HIGH (ref 70–99)
Glucose-Capillary: 140 mg/dL — ABNORMAL HIGH (ref 70–99)
Glucose-Capillary: 145 mg/dL — ABNORMAL HIGH (ref 70–99)
Glucose-Capillary: 149 mg/dL — ABNORMAL HIGH (ref 70–99)
Glucose-Capillary: 150 mg/dL — ABNORMAL HIGH (ref 70–99)
Glucose-Capillary: 153 mg/dL — ABNORMAL HIGH (ref 70–99)
Glucose-Capillary: 160 mg/dL — ABNORMAL HIGH (ref 70–99)
Glucose-Capillary: 168 mg/dL — ABNORMAL HIGH (ref 70–99)
Glucose-Capillary: 172 mg/dL — ABNORMAL HIGH (ref 70–99)
Glucose-Capillary: 175 mg/dL — ABNORMAL HIGH (ref 70–99)
Glucose-Capillary: 182 mg/dL — ABNORMAL HIGH (ref 70–99)

## 2024-03-28 LAB — CBC WITH DIFFERENTIAL/PLATELET
Abs Immature Granulocytes: 0.11 K/uL — ABNORMAL HIGH (ref 0.00–0.07)
Basophils Absolute: 0 K/uL (ref 0.0–0.1)
Basophils Relative: 0 %
Eosinophils Absolute: 0.1 K/uL (ref 0.0–0.5)
Eosinophils Relative: 1 %
HCT: 23.7 % — ABNORMAL LOW (ref 39.0–52.0)
Hemoglobin: 7.9 g/dL — ABNORMAL LOW (ref 13.0–17.0)
Immature Granulocytes: 1 %
Lymphocytes Relative: 13 %
Lymphs Abs: 1.1 K/uL (ref 0.7–4.0)
MCH: 31.6 pg (ref 26.0–34.0)
MCHC: 33.3 g/dL (ref 30.0–36.0)
MCV: 94.8 fL (ref 80.0–100.0)
Monocytes Absolute: 0.7 K/uL (ref 0.1–1.0)
Monocytes Relative: 8 %
Neutro Abs: 6.9 K/uL (ref 1.7–7.7)
Neutrophils Relative %: 77 %
Platelets: 309 K/uL (ref 150–400)
RBC: 2.5 MIL/uL — ABNORMAL LOW (ref 4.22–5.81)
RDW: 14 % (ref 11.5–15.5)
WBC: 8.9 K/uL (ref 4.0–10.5)
nRBC: 0 % (ref 0.0–0.2)

## 2024-03-28 LAB — BETA-HYDROXYBUTYRIC ACID
Beta-Hydroxybutyric Acid: 0.87 mmol/L — ABNORMAL HIGH (ref 0.05–0.27)
Beta-Hydroxybutyric Acid: 2.45 mmol/L — ABNORMAL HIGH (ref 0.05–0.27)
Beta-Hydroxybutyric Acid: 2 mmol/L — ABNORMAL HIGH (ref 0.05–0.27)
Beta-Hydroxybutyric Acid: 1.61 mmol/L — ABNORMAL HIGH (ref 0.05–0.27)
Beta-Hydroxybutyric Acid: 2.11 mmol/L — ABNORMAL HIGH (ref 0.05–0.27)

## 2024-03-28 LAB — BASIC METABOLIC PANEL WITH GFR
Anion gap: 15 (ref 5–15)
Anion gap: 16 — ABNORMAL HIGH (ref 5–15)
Anion gap: 14 (ref 5–15)
BUN: 34 mg/dL — ABNORMAL HIGH (ref 6–20)
BUN: 44 mg/dL — ABNORMAL HIGH (ref 6–20)
BUN: 43 mg/dL — ABNORMAL HIGH (ref 6–20)
CO2: 26 mmol/L (ref 22–32)
CO2: 24 mmol/L (ref 22–32)
CO2: 23 mmol/L (ref 22–32)
Calcium: 8.5 mg/dL — ABNORMAL LOW (ref 8.9–10.3)
Calcium: 8.2 mg/dL — ABNORMAL LOW (ref 8.9–10.3)
Calcium: 7.5 mg/dL — ABNORMAL LOW (ref 8.9–10.3)
Chloride: 94 mmol/L — ABNORMAL LOW (ref 98–111)
Chloride: 96 mmol/L — ABNORMAL LOW (ref 98–111)
Chloride: 100 mmol/L (ref 98–111)
Creatinine, Ser: 4.41 mg/dL — ABNORMAL HIGH (ref 0.61–1.24)
Creatinine, Ser: 6.6 mg/dL — ABNORMAL HIGH (ref 0.61–1.24)
Creatinine, Ser: 6.52 mg/dL — ABNORMAL HIGH (ref 0.61–1.24)
GFR, Estimated: 17 mL/min — ABNORMAL LOW (ref >60)
GFR, Estimated: 11 mL/min — ABNORMAL LOW (ref >60)
GFR, Estimated: 11 mL/min — ABNORMAL LOW (ref >60)
Glucose, Bld: 142 mg/dL — ABNORMAL HIGH (ref 70–99)
Glucose, Bld: 148 mg/dL — ABNORMAL HIGH (ref 70–99)
Glucose, Bld: 161 mg/dL — ABNORMAL HIGH (ref 70–99)
Potassium: 3.3 mmol/L — ABNORMAL LOW (ref 3.5–5.1)
Potassium: 3.7 mmol/L (ref 3.5–5.1)
Potassium: 3.5 mmol/L (ref 3.5–5.1)
Sodium: 135 mmol/L (ref 135–145)
Sodium: 136 mmol/L (ref 135–145)
Sodium: 137 mmol/L (ref 135–145)

## 2024-03-28 LAB — LACTIC ACID, PLASMA: Lactic Acid, Venous: 0.7 mmol/L (ref 0.5–1.9)

## 2024-03-28 LAB — COMPREHENSIVE METABOLIC PANEL WITH GFR
ALT: 72 U/L — ABNORMAL HIGH (ref 0–44)
ALT: 58 U/L — ABNORMAL HIGH (ref 0–44)
AST: 23 U/L (ref 15–41)
AST: 20 U/L (ref 15–41)
Albumin: 2.9 g/dL — ABNORMAL LOW (ref 3.5–5.0)
Albumin: 2.8 g/dL — ABNORMAL LOW (ref 3.5–5.0)
Alkaline Phosphatase: 83 U/L (ref 38–126)
Alkaline Phosphatase: 68 U/L (ref 38–126)
Anion gap: 20 — ABNORMAL HIGH (ref 5–15)
Anion gap: 15 (ref 5–15)
BUN: 97 mg/dL — ABNORMAL HIGH (ref 6–20)
BUN: 47 mg/dL — ABNORMAL HIGH (ref 6–20)
CO2: 21 mmol/L — ABNORMAL LOW (ref 22–32)
CO2: 23 mmol/L (ref 22–32)
Calcium: 8.1 mg/dL — ABNORMAL LOW (ref 8.9–10.3)
Calcium: 8 mg/dL — ABNORMAL LOW (ref 8.9–10.3)
Chloride: 94 mmol/L — ABNORMAL LOW (ref 98–111)
Chloride: 97 mmol/L — ABNORMAL LOW (ref 98–111)
Creatinine, Ser: 10.43 mg/dL — ABNORMAL HIGH (ref 0.61–1.24)
Creatinine, Ser: 7.41 mg/dL — ABNORMAL HIGH (ref 0.61–1.24)
GFR, Estimated: 6 mL/min — ABNORMAL LOW (ref >60)
GFR, Estimated: 9 mL/min — ABNORMAL LOW (ref >60)
Glucose, Bld: 143 mg/dL — ABNORMAL HIGH (ref 70–99)
Glucose, Bld: 161 mg/dL — ABNORMAL HIGH (ref 70–99)
Potassium: 4 mmol/L (ref 3.5–5.1)
Potassium: 3.8 mmol/L (ref 3.5–5.1)
Sodium: 135 mmol/L (ref 135–145)
Sodium: 135 mmol/L (ref 135–145)
Total Bilirubin: 0.9 mg/dL (ref 0.0–1.2)
Total Bilirubin: 1 mg/dL (ref 0.0–1.2)
Total Protein: 5.5 g/dL — ABNORMAL LOW (ref 6.5–8.1)
Total Protein: 5.4 g/dL — ABNORMAL LOW (ref 6.5–8.1)

## 2024-03-28 LAB — PHOSPHORUS: Phosphorus: 6.7 mg/dL — ABNORMAL HIGH (ref 2.5–4.6)

## 2024-03-28 LAB — RHEUMATOID FACTOR: Rheumatoid fact SerPl-aCnc: <10 [IU]/mL (ref <14.0)

## 2024-03-28 LAB — PROCALCITONIN: Procalcitonin: 14.6 ng/mL

## 2024-03-28 LAB — ANTI-DNA ANTIBODY, DOUBLE-STRANDED: ds DNA Ab: <1 [IU]/mL (ref 0–9)

## 2024-03-28 LAB — ANTIEXTRACTABLE NUCLEAR AG
ENA SM Ab Ser-aCnc: <0.2 AI (ref 0.0–0.9)
Ribonucleic Protein: <0.2 AI (ref 0.0–0.9)

## 2024-03-28 LAB — GLOMERULAR BASEMENT MEMBRANE ANTIBODIES: GBM Ab: <0.2 U (ref 0.0–0.9)

## 2024-03-28 LAB — MAGNESIUM: Magnesium: 2.3 mg/dL (ref 1.7–2.4)

## 2024-03-28 MED ORDER — HEPARIN SODIUM (PORCINE) 1000 UNIT/ML DIALYSIS
3000.0000 [IU] | Freq: Once | INTRAMUSCULAR | Status: AC
  Administered 2024-03-28: 3000 [IU] via INTRAVENOUS_CENTRAL

## 2024-03-28 MED ORDER — LABETALOL HCL 200 MG PO TABS: 100.0000 mg | ORAL_TABLET | Freq: Two times a day (BID) | ORAL | Status: DC

## 2024-03-28 MED ORDER — LIDOCAINE HCL (PF) 1 % IJ SOLN: 5.0000 mL | INTRAMUSCULAR | Status: DC | PRN

## 2024-03-28 MED ORDER — HEPARIN SODIUM (PORCINE) 1000 UNIT/ML DIALYSIS: 1000.0000 [IU] | INTRAMUSCULAR | Status: DC | PRN

## 2024-03-28 MED ORDER — MIDAZOLAM HCL 2 MG/2ML IJ SOLN
1.0000 mg | Freq: Once | INTRAMUSCULAR | Status: AC
  Administered 2024-03-28: 1 mg via INTRAVENOUS

## 2024-03-28 MED ORDER — MIDAZOLAM HCL 2 MG/2ML IJ SOLN
INTRAMUSCULAR | Status: AC
  Filled 2024-03-28: qty 2

## 2024-03-28 MED ORDER — MIDAZOLAM HCL 2 MG/2ML IJ SOLN: 1.0000 mg | Freq: Once | INTRAMUSCULAR | Status: AC

## 2024-03-28 MED ORDER — LIDOCAINE-EPINEPHRINE 1 %-1:100000 IJ SOLN
20.0000 mL | Freq: Once | INTRAMUSCULAR | Status: AC
  Administered 2024-03-28: 15 mL via INTRADERMAL

## 2024-03-28 MED ORDER — LIDOCAINE-EPINEPHRINE 1 %-1:100000 IJ SOLN
INTRAMUSCULAR | Status: AC
  Filled 2024-03-28: qty 1

## 2024-03-28 MED ORDER — AMLODIPINE BESYLATE 10 MG PO TABS
10.0000 mg | ORAL_TABLET | Freq: Every day | ORAL | Status: DC
  Administered 2024-03-28 – 2024-03-29 (×2): 10 mg via ORAL
  Filled 2024-03-28 (×2): qty 1

## 2024-03-28 MED ORDER — LABETALOL HCL 200 MG PO TABS
100.0000 mg | ORAL_TABLET | Freq: Two times a day (BID) | ORAL | Status: DC
Start: 2024-03-28 — End: 2024-03-30
  Administered 2024-03-28 – 2024-03-29 (×3): 100 mg via ORAL
  Filled 2024-03-28 (×3): qty 1

## 2024-03-28 MED ORDER — POTASSIUM CHLORIDE 10 MEQ/100ML IV SOLN
10.0000 meq | Freq: Once | INTRAVENOUS | Status: AC
  Administered 2024-03-28: 10 meq via INTRAVENOUS
  Filled 2024-03-28: qty 100

## 2024-03-28 NOTE — Inpatient Diabetes Management (Signed)
 Inpatient Diabetes Program Recommendations  AACE/ADA: New Consensus Statement on Inpatient Glycemic Control (2015)  Target Ranges:  Prepandial:   less than 140 mg/dL      Peak postprandial:   less than 180 mg/dL (1-2 hours)      Critically ill patients:  140 - 180 mg/dL   Lab Results  Component Value Date   GLUCAP 140 (H) 03/28/2024   HGBA1C 6.5 (H) 10/21/2022    Review of Glycemic Control  Diabetes history: Type 1 Outpatient Diabetes medications: Basglar 18 units daily, Novolog  3-6 units sliding scale TID Current orders for Inpatient glycemic control: IV insulin  drip  Inpatient Diabetes Program Recommendations:   Spoke to patient at the bedside. Patient was diagnosed with diabetes when he was 33 years old. Has been seeing Dr. Beryl, endocrinologist, since 2024. States that he takes Basaglar  18 units daily and Novolog  3-6 units scale TID with meals. Has a Dexcom G7 at home for checking blood sugars.   Has history of ESRD, on dialysis. Became very weak and confused at his last outpatient dialysis treatment. Girlfriend decided to bring him to the ED. Patient had eye surgery about 2 weeks ago. Has been nauseated and gaging over the weekend; not able to eat much then. Having some chest and back pain, as well. Will be starting a liquid diet today to see how he tolerates it.   Will continue to monitor blood sugars while in the hospital.  Marjorie Lunger RN BSN CDE Diabetes Coordinator Pager: [phone]  8am-5pm

## 2024-03-28 NOTE — Progress Notes (Signed)
 [facility] Progress Note   Subjective:   Patient had HD overnight. They were tired from the late HD. We were able to UF 3.5L off of him. Will need standing weight on him before d/c. He is still 2.2 kg over his EDW. BP still very high - think that this is fluid driven. His labs have improved and his anion gap is now 15 and his CO2 is 26.   Objective Vitals:   03/28/24 0400 03/28/24 0415 03/28/24 0500 03/28/24 0801  BP: (!) 192/86 (!) 191/84 (!) 177/107 (!) 181/105  Pulse: 99 (!) 102 (!) 103 98  Resp: 19 (!) 27 20 12   Temp: 98.7 F (37.1 C)   98.3 F (36.8 C)  TempSrc: Oral   Oral  SpO2: 92% 95% 96% 95%  Weight:      Height:       Exam Gen alert, no distress, very tired No rash, cyanosis or gangrene Sclera anicteric, throat clear  No jvd or bruits Chest clear bilat to bases, no rales/ wheezing RRR no MRG Abd soft ntnd no mass or ascites +bs GU deferred MS no joint effusions or deformity Ext trace LE edema, no other edema Neuro is lethargic, easy to awaken, nf     AVF+ bruit  Additional Objective Labs: Basic Metabolic Panel: Recent Labs  Lab 03/27/24 0930 03/27/24 1153 03/27/24 2053 03/27/24 2342 03/28/24 0315  NA 134*   < > 133* 135 135  K 4.3   < > 4.3 4.0 3.3*  CL 92*   < > 94* 94* 94*  CO2 19*   < > 20* 21* 26  GLUCOSE 241*   < > 180* 143* 142*  BUN 92*   < > 96* 97* 34*  CREATININE 9.58*   < > 10.19* 10.43* 4.41*  CALCIUM  8.1*   < > 8.0* 8.1* 8.5*  PHOS 7.0*  --   --  6.7*  --    < > = values in this interval not displayed.   Liver Function Tests: Recent Labs  Lab 03/26/24 0928 03/27/24 0930 03/27/24 2342  AST 71* 30 23  ALT 90* 83* 72*  ALKPHOS 89 94 83  BILITOT 1.0 1.2 0.9  PROT 5.3* 6.1* 5.5*  ALBUMIN  2.8* 3.2* 2.9*   No results for input(s): LIPASE, AMYLASE in the last 168 hours. CBC: Recent Labs  Lab 03/25/24 1820 03/26/24 0928 03/27/24 0930 03/27/24 2342  WBC 15.0* 11.8* 10.5 8.9  NEUTROABS  --   --  8.5* 6.9   HGB 8.1* 7.4* 8.6* 7.9*  HCT 24.2* 22.6* 26.3* 23.7*  MCV 94.2 96.6 97.4 94.8  PLT 295 320 333 309    CBG: Recent Labs  Lab 03/28/24 0307 03/28/24 0421 03/28/24 0524 03/28/24 0655 03/28/24 0902  GLUCAP 126* 149* 145* 140* 153*    Medications:  doxycycline  (VIBRAMYCIN ) IV 100 mg (03/28/24 0306)   insulin  1.5 Units/hr (03/28/24 0919)   piperacillin -tazobactam (ZOSYN )  IV 2.25 g (03/28/24 0522)    atropine   1 drop Left Eye Daily   Chlorhexidine  Gluconate Cloth  6 each Topical Q0600   erythromycin    Left Eye QHS   midazolam   1 mg Intravenous Once   midazolam   1 mg Intravenous Once   pantoprazole  (PROTONIX ) IV  40 mg Intravenous Q12H   prednisoLONE  acetate  1 drop Left Eye Q8H   Followed by   [START ON 03/30/2024] prednisoLONE  acetate  1 drop Left Eye BID   Followed by   NOREEN ON 04/06/2024] prednisoLONE   acetate  1 drop Left Eye Daily   sodium chloride  flush  3 mL Intravenous Q12H   sodium chloride  flush  3 mL Intravenous Q12H    Home bp meds: Norvasc  10 every day (ran out) Labetalol  100 bid      OP HD: [facility] MWF 4h  B400   82.4kg  2K bath  AVF  Heparin  4000 Last OP HD 8/4, post wt 86.2kg (+3.8) Good compliance Last Mircera 03/25/24 and was given 75 mcg   Assessment/ Plan: DKA: doesn't need saline-containing IVFs since he is esrd and wt's are up. Have changed IVF's to just D5W 50 cc/hr. His anion gap 15 has normalized and his CO2 26 has corrected. Sepsis: possible PNA , r/o acute cholecystitis. Normal HIDA scan and has absence of gallstones. CCS signed off. Cxr 8/6 showed small right pleura effusion with right basilar airspace opacities, possible atelectasis or pulmonary edema.  ESRD: on HD MWF.Off schedule while in hospital. Next HD 03/30/24.  HTN: home meds on hold for now, BP's wnl range. Follow.  Volume: up 5-6kg by wts, mild edema LE's, mild congestion on xray. Plan UF 2-3 L w/ next HD.  Anemia of esrd: 7- 9, tranfuse prn, will follow. Last ESA 03/25/24   Belvie Och, NP 03/28/2024, 10:46 AM   [facility]

## 2024-03-28 NOTE — TOC Initial Note (Signed)
 Transition of Care (TOC) - Initial/Assessment Note  Rayfield Gobble RN, BSN Inpatient Care Management Unit 4E- RN Case Manager See Treatment Team for direct phone #   Patient Details  Name: John [name] MRN: [id number] Date of Birth: 1991-03-16  Transition of Care [facility]) CM/SW Contact:    Gobble Rayfield Hurst, RN Phone Number: 03/28/2024, 4:54 PM  Clinical Narrative:                 Received request that pt/family would like to speak with CM.   CM went to speak with pt at bedside- girlfriend and her mother also present- pt gave permission to speak with them as well.   Girlfriend had questions regarding Medicaid application that she states was filed on July 12 w/ DSS. She reports that DSS called on July 30 for updates to application. GF is trying to find out if pt has a pending Medicaid number yet and DSS would not speak with her while pt is in hospital even though pt gave permission.  They are trying to get insurances straightened out as pt had [employer], but was dropped, now has Medicare A/B but no medication coverage and can not afford meds. He also is on the transplant list at [facility] and they need to have the information on his Medicaid pending #.   CM will reach out to [facility] to see if they can assist.   Email sent to [facility]- with response back that no pending Medicaid ID could be found in system. She has called DSS as well and left VM to follow up to see if she can find out any further info regarding application.  Updated given to GF's mother at bedside- CM will follow up if more info received from [facility].   Also discussed medical HCPOA and info packet provided for them to review and talk about. Explained that the Chaplain here could assist if they wanted to do any of the documents here prior to discharge.    Expected Discharge Plan: Home/Self Care Barriers to Discharge: Continued Medical Work up   Patient Goals and CMS Choice Patient states their goals for this hospitalization and ongoing  recovery are:: return home   Choice offered to / list presented to : NA      Expected Discharge Plan and Services   Discharge Planning Services: CM Consult   Living arrangements for the past 2 months: Single Family Home                                      Prior Living Arrangements/Services Living arrangements for the past 2 months: Single Family Home Lives with:: Significant Other Patient language and need for interpreter reviewed:: Yes Do you feel safe going back to the place where you live?: Yes      Need for Family Participation in Patient Care: Yes (Comment) Care giver support system in place?: Yes (comment)   Criminal Activity/Legal Involvement Pertinent to Current Situation/Hospitalization: No - Comment as needed  Activities of Daily Living   ADL Screening (condition at time of admission) Independently performs ADLs?: Yes (appropriate for developmental age) Is the patient deaf or have difficulty hearing?: No Does the patient have difficulty seeing, even when wearing glasses/contacts?: Yes Does the patient have difficulty concentrating, remembering, or making decisions?: No  Permission Sought/Granted                  Emotional Assessment  Appearance:: Appears stated age Attitude/Demeanor/Rapport: Engaged Affect (typically observed): Accepting Orientation: : Oriented to Self, Oriented to Place, Oriented to  Time, Oriented to Situation Alcohol / Substance Use: Not Applicable Psych Involvement: No (comment)  Admission diagnosis:  Sinus tachycardia [R00.0] Lactic acidosis [E87.20] DKA (diabetic ketoacidosis) (HCC) [E11.10] Elevated troponin [R79.89] ESRD (end stage renal disease) on dialysis (HCC) [N18.6, Z99.2] Diabetic ketoacidosis without coma associated with type 1 diabetes mellitus (HCC) [E10.10] Multifocal pneumonia [J18.9] Patient Active Problem List   Diagnosis Date Noted   ESRD (end stage renal disease) (HCC) 03/26/2024   Hypertrophic  cardiomyopathy (HCC) 03/26/2024   Chronic anemia 03/26/2024   Sepsis (HCC) 03/26/2024   Acute cholecystitis 03/26/2024   CAP (community acquired pneumonia) 03/26/2024   Ground glass opacity present on imaging of lung 03/26/2024   Diabetic acidosis, type I (HCC) 03/25/2024   Acute renal failure (ARF) (HCC) 10/20/2022   NSAID long-term use 10/20/2022   Uncontrolled type 1 diabetes mellitus with hyperglycemia, with long-term current use of insulin  (HCC) 10/20/2022   Attention deficit hyperactivity disorder (ADHD) 04/19/2016   Tobacco abuse 07/07/2014   Migraine headache 03/26/2014   Alcohol intake above recommended sensible limits 03/29/2011   Smoker 03/29/2011   Insulin  dependent type 1 diabetes mellitus (HCC) 02/04/2011   Essential hypertension 02/04/2011   Goiter 02/04/2011   PCP:  Beryl Donnice BRAVO, MD Pharmacy:   [phone] - [place], [place] - [address] [place] [address] Phone: [phone] Fax: [phone]     Social Drivers of Health (SDOH) Social History: SDOH Screenings   Food Insecurity: No Food Insecurity (03/26/2024)  Housing: Low Risk  (03/26/2024)  Transportation Needs: No Transportation Needs (03/26/2024)  Utilities: Not At Risk (03/26/2024)  Financial Resource Strain: Medium Risk (09/06/2023)   Received from [facility]  Physical Activity: Insufficiently Active (09/06/2023)   Received from [facility]  Social Connections: Moderately Integrated (09/06/2023)   Received from [facility]  Stress: Stress Concern Present (09/06/2023)   Received from [facility]  Tobacco Use: Medium Risk (03/26/2024)   SDOH Interventions:     Readmission Risk Interventions     No data to display

## 2024-03-28 NOTE — Plan of Care (Signed)

## 2024-03-28 NOTE — Procedures (Signed)
 Interventional Radiology Procedure Note  Procedure: Tunneled PICC placement  Complications: None  Estimated Blood Loss: < 10 mL  Findings: DL RIJ PICC placement with U/W and Fluoro.  John DELENA Banner, MD

## 2024-03-28 NOTE — Progress Notes (Signed)
 Received patient in bed. HD to bedside Alert and oriented.  Informed consent signed and in chart. TX duration: 3.5 hrs   Patient tolerated well.  Transported back to the room Alert, without acute distress.  Hand-off given to patient's nurse.   Access used: LEFT AVF  Access issues: none  Total UF removed:  Medication(s) given: meds given by Primary RN SEE MAR  Post HD VS: see table  Post HD weight: uta     03/28/24 0400  Vitals  Temp 98.7 F (37.1 C)  Temp Source Oral  BP (!) 192/86  MAP (mmHg) 113  BP Location Right Leg  BP Method Automatic  Patient Position (if appropriate) Lying  Pulse Rate 99  Pulse Rate Source Monitor  ECG Heart Rate 99  Resp 19  Oxygen Therapy  SpO2 92 %  O2 Device Room Air  Patient Activity (if Appropriate) In bed  Pulse Oximetry Type Continuous  During Treatment Monitoring  Blood Flow Rate (mL/min) 0 mL/min  Arterial Pressure (mmHg) -1.41 mmHg  Venous Pressure (mmHg) -1.21 mmHg  TMP (mmHg) -50.5 mmHg  Ultrafiltration Rate (mL/min) 1240 mL/min  Dialysate Flow Rate (mL/min) 299 ml/min  Dialysate Potassium Concentration 3  Dialysate Calcium  Concentration 2.5  Duration of HD Treatment -hour(s) 3.5 hour(s)  Cumulative Fluid Removed (mL) per Treatment  3500.13  HD Safety Checks Performed Yes  Intra-Hemodialysis Comments Tx completed;Tolerated well  Post Treatment  Dialyzer Clearance Lightly streaked  Hemodialysis Intake (mL) 0 mL  Liters Processed 84  Fluid Removed (mL) 3500 mL  Tolerated HD Treatment Yes  Post-Hemodialysis Comments HD treatment completed and tolrated, axo x 4  AVG/AVF Arterial Site Held (minutes) 5 minutes  AVG/AVF Venous Site Held (minutes) 5 minutes  Fistula / Graft Left Upper arm Arteriovenous fistula  Placement Date/Time: 10/25/22 1021   Placed prior to admission: No  Orientation: Left  Access Location: Upper arm  Access Type: Arteriovenous fistula  Site Condition No complications  Fistula / Graft Assessment  Present;Thrill;Bruit  Status Deaccessed  Drainage Description None     Mercer CHARLENA Montgomery, BSN, RN Kidney Dialysis Unit

## 2024-03-28 NOTE — Progress Notes (Signed)
 PROGRESS NOTE    John [name]  FMW:[id number] DOB: 10/11/1990 DOA: 03/25/2024 PCP: Beryl Donnice BRAVO, MD   Brief Narrative:  The patient is a 33 year old with a history of DM type I, HTN, hypertrophic cardiomyopathy, bilateral retinal detachment causing blindness, chronic anemia, and ESRD on HD TTS who was sent to the ER 8/4 PM from his dialysis unit where he was found to be confused following several preceding days notable for loss of appetite with multiple episodes of vomiting. Workup in the ER included CT chest and abdomen which suggested a possible pulmonary infiltrate. Abdominal ultrasound suggested gallbladder wall thickening but no Murphy sign. CT head was unrevealing.   General surgery evaluated and his HIDA scan showed filling of the gallbladder and this is not consistent with cholecystitis.  There is no gallstones noted and no right upper quadrant tenderness.  Surgery team did not feel he had an acute cholecystitis and do not recommend cholecystectomy at this time.  They have now signed off the case.  Pulmonary was also consulted for his right upper lobe groundglass opacity and they felt that this is likely aspiration or bacterial process.  They ordered an echocardiogram to rule out worsening MR and also ordered serologies to rule out connective tissue disease.  Patient remained in DKA so he is left on the insulin  drip for now.  Attempts were made to try and wean him off however his beta-hydroxybutyrate acid started trending up so this was discontinued canceled.  Patient is complaining of some facial swelling so we will obtain a maxillofacial CT scan and continue antibiotics with IV Zosyn  and doxycycline .  Nephrology was consulted for maintenance of hemodialysis and patient will be dialyzed in the room later today.  Assessment and Plan:  DKA without coma in DM 1: Beta hydroxybutyrate greater than 8 at presentation, following trend w/ ongoing insulin  tx.  Will continue insulin  drip for  now and continue monitor beta-hydroxybutyrate trend.  Had trended down to 1.32 and was good to be transitioned however beta-hydroxybutyrate were back up to almost 3 so he was left on the insulin  drip. Beta-Hydroxybutryic Acid continues to be elevate and now went from 1.91 -> 2.18 -> 2.45 -> 2.00.  Was given IV fluids however nephrology changed to D5W. Will allow for A CLD for now that his N/V is improved   Metabolic encephalopathy: Due to DKA - CT head without acute findings.  Mental status appears appropriate and much improved and he appears at Baseline   Aspiration pneumonia with possible sepsis POA: Patient was tachycardic and tachypneic at presentation but this of course could simply be due to his DKA -monitor for any further clinical evidence to support a diagnosis of pneumonia -empiric antibiotic for now with Doxy/Zosyn  - RVP negative - CoViD and Influenza negative -elevated procalcitonin not helpful in the setting of ESRD as it was 17.0 but nonetheless he remains on IV antibiotics with doxycycline  and Zosyn  and it is improving (14.60).  Given his facial swelling we will obtained a Maxillofacial CT as below.  WBC has normalized and is now 8.5  Facial swelling: Bilateral little bit worse on the left compared to the right.  ? if is related to periodontal disease.  Obtaining maxillofacial CT scan and showed Subcutaneous edema and stranding about the lower face and chin which was concerning for cellulitis but no abscess was seen. Continue Abx as above.    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 ECHO showed The mitral valve is normal in structure. Trivial mitral valve regurgitation. No evidence of mitral stenosis. Repeat CXR done and showed Mild cardiomegaly with interstitial edema. Small right pleural effusion with  right basilar airspace opacities, possibly atelectasis or asymmetric pulmonary edema. A superimposed bronchopneumonia could also have this appearance in the correct clinical context.   Possible acute cholecystitis, ruled out: Ultrasound noted gallbladder wall thickening but no evidence of cholelithiasis -no Murphy's sign on ultrasound or physical exam - Gen Surgery following - HIDA scan showed filling of the gallbladder.   ESRD on HD TTS: Nephrology consulted to attend to ongoing dialysis. -BUN/Cr Trend: Recent Labs  Lab 03/27/24 0351 03/27/24 0930 03/27/24 1153 03/27/24 2053 03/27/24 2342 03/28/24 0315 03/28/24 1322  BUN 88* 92* 97* 96* 97* 34* 44*  CREATININE 8.90* 9.58* 9.78* 10.19* 10.43* 4.41* 6.60*  -Avoid Nephrotoxic Medications, Contrast Dyes, Hypotension and Dehydration to Ensure Adequate Renal Perfusion and will need to Renally Adjust Meds -Continue to Monitor and Trend Renal Function carefully and repeat CMP in the AM   HTN with hypertrophic cardiomyopathy: Patient is severely volume overloaded and blood pressures remain significantly elevated.  Continue as needed blood pressure medications and added IV labetalol . BP remains elevated but will resume home Amlodipine  10 mg po daily and Home Labetalol  100 mg po BID. CTM BP per Protocol. Last BP reading was 225/83!  Volume Overload: In the setting of missed dialysis.  EF is 55 to 60% and patient does have indeterminate diastolic parameters.  Will need volume maintenance with hemodialysis and patient dialyzed overnight and had 3.5 Liters removed.    Elevated Troponin: In the setting of ESRD and DKA -no symptoms to suggest ACS -EKG without acute changes -elevated troponin likely due to poor clearance due to ESRD and demand ischemia - TTE pending - further eval will be required if patient develops chest pain or if there is a WMA on TTE    Anemia of Chronic Kidney Disease: Hgb/Hct trend:  Recent Labs  Lab 03/25/24 1806 03/25/24 1806  03/25/24 1820 03/26/24 0928 03/27/24 0930 03/27/24 2342  HGB 17.0  --  8.1* 7.4* 8.6* 7.9*  HCT 50.0  --  24.2* 22.6* 26.3* 23.7*  MCV  --    < > 94.2 96.6 97.4 94.8   < > = values in this interval not displayed.  - Check anemia panel in AM.  Continue monitor for signs of signs of bleeding; no overt bleeding noted. Repeat CBC in the AM - CBC in a.m.  Left eye retinal hemorrhages: Continue with eyedrops with erythromycin  ophthalmic ointment and prednisone acetate 1% ophthalmic suspension with the taper as written in [facility]  GERD/GI prophylaxis: Initiate PPI with pantoprazole  IV 40 mg every 12h for now  Hypoalbuminemia: Patient's Albumin  Lvl went from 4.0 -> 2.8 -> 3.2 -> 2.9. CTM and Trend and repeat CMP in the AM   DVT prophylaxis: SCDs Start: 03/26/24 0308 Place TED hose Start: 03/26/24 0308    Code Status: Full Code Family Communication: D/w Family present @ bedside  Disposition Plan:  Level of care: Progressive Status is: Inpatient Remains inpatient appropriate because: Needs further clinical improvement, weaning off insulin  drip and clearance by the specialist.   Consultants:  Nephrology PCCM/Pulmonary General Surgery  Procedures:  As delineated as above  Antimicrobials:  Anti-infectives (From admission, onward)    Start     Dose/Rate Route Frequency Ordered Stop  03/26/24 0400  doxycycline  (VIBRAMYCIN ) 100 mg in sodium chloride  0.9 % 250 mL IVPB        100 mg 125 mL/hr over 120 Minutes Intravenous Every 12 hours 03/26/24 0307 04/02/24 0344   03/26/24 0400  piperacillin -tazobactam (ZOSYN ) IVPB 2.25 g        2.25 g 100 mL/hr over 30 Minutes Intravenous Every 8 hours 03/26/24 0307     03/25/24 1930  cefTRIAXone  (ROCEPHIN ) 1 g in sodium chloride  0.9 % 100 mL IVPB        1 g 200 mL/hr over 30 Minutes Intravenous  Once 03/25/24 1917 03/25/24 2057   03/25/24 1930  azithromycin  (ZITHROMAX ) 500 mg in sodium chloride  0.9 % 250 mL IVPB        500 mg 250 mL/hr over 60  Minutes Intravenous  Once 03/25/24 1917 03/25/24 2241       Subjective: Seen and examined at bedside and was doing little bit better today compared to yesterday.  Just came back from IR after getting IV placed.  Was little bit nauseous but wanted to try clear liquid diet given that he is feeling hungry.  No other concerns or complaints at this time.  Objective: Vitals:   03/28/24 0500 03/28/24 0801 03/28/24 1151 03/28/24 1617  BP: (!) 177/107 (!) 181/105 (!) 202/110 (!) 225/83  Pulse: (!) 103 98 (!) 101 (!) 102  Resp: 20 12  20   Temp:  98.3 F (36.8 C) 98.2 F (36.8 C) 98.3 F (36.8 C)  TempSrc:  Oral Oral Oral  SpO2: 96% 95% 99% 98%  Weight:      Height:        Intake/Output Summary (Last 24 hours) at 03/28/2024 1808 Last data filed at 03/28/2024 1314 Gross per 24 hour  Intake 120 ml  Output 3500 ml  Net -3380 ml   Filed Weights   03/26/24 0238  Weight: 88.1 kg   Examination: Physical Exam:  Constitutional: Chronically ill-appearing young Caucasian male who appears older more comfortable today compared to yesterday Respiratory: Diminished to auscultation bilaterally with some coarse breath sounds and has some slight rhonchi but no appreciable wheezing or rales or crackles., no wheezing, rales, rhonchi or crackles. Normal respiratory effort and patient is not tachypenic. No accessory muscle use.  Cardiovascular: Tachycardic rate but regular rhythm, no murmurs / rubs / gallops. S1 and S2 auscultated.  Has 1+ lower extremity edema Abdomen: Soft, non-tender, non-distended. Bowel sounds positive.  GU: Deferred. Musculoskeletal: No clubbing / cyanosis of digits/nails. No joint deformity upper and lower extremities.  Facial swelling is improved Skin: No rashes, lesions, ulcers wound to skin evaluation. No induration; Warm and dry.  Neurologic: Retinal hemorrhages and blindness but other cranial nerves II through XII grossly intact Psychiatric: Normal judgment and insight. Awake and  alert.   Data Reviewed: I have personally reviewed following labs and imaging studies  CBC: Recent Labs  Lab 03/25/24 1806 03/25/24 1820 03/26/24 0928 03/27/24 0930 03/27/24 2342  WBC  --  15.0* 11.8* 10.5 8.9  NEUTROABS  --   --   --  8.5* 6.9  HGB 17.0 8.1* 7.4* 8.6* 7.9*  HCT 50.0 24.2* 22.6* 26.3* 23.7*  MCV  --  94.2 96.6 97.4 94.8  PLT  --  295 320 333 309   Basic Metabolic Panel: Recent Labs  Lab 03/25/24 1820 03/26/24 0355 03/27/24 0930 03/27/24 1153 03/27/24 2053 03/27/24 2342 03/28/24 0315 03/28/24 1322  NA 139   < > 134* 133* 133* 135 135 136  K 4.9   < > 4.3 4.4 4.3 4.0 3.3* 3.7  CL 86*   < > 92* 92* 94* 94* 94* 96*  CO2 17*   < > 19* 19* 20* 21* 26 24  GLUCOSE 439*   < > 241* 170* 180* 143* 142* 148*  BUN 56*   < > 92* 97* 96* 97* 34* 44*  CREATININE 6.14*   < > 9.58* 9.78* 10.19* 10.43* 4.41* 6.60*  CALCIUM  8.7*   < > 8.1* 8.3* 8.0* 8.1* 8.5* 8.2*  MG 2.4  --  2.3  --   --  2.3  --   --   PHOS  --   --  7.0*  --   --  6.7*  --   --    < > = values in this interval not displayed.   GFR: Estimated Creatinine Clearance: 18.1 mL/min (A) (by C-G formula based on SCr of 6.6 mg/dL (H)). Liver Function Tests: Recent Labs  Lab 03/25/24 1820 03/26/24 0928 03/27/24 0930 03/27/24 2342  AST 54* 71* 30 23  ALT 46* 90* 83* 72*  ALKPHOS 136* 89 94 83  BILITOT 0.8 1.0 1.2 0.9  PROT 6.5 5.3* 6.1* 5.5*  ALBUMIN  4.0 2.8* 3.2* 2.9*   No results for input(s): LIPASE, AMYLASE in the last 168 hours. No results for input(s): AMMONIA in the last 168 hours. Coagulation Profile: Recent Labs  Lab 03/26/24 0355  INR 1.5*   Cardiac Enzymes: No results for input(s): CKTOTAL, CKMB, CKMBINDEX, TROPONINI in the last 168 hours. BNP (last 3 results) No results for input(s): PROBNP in the last 8760 hours. HbA1C: No results for input(s): HGBA1C in the last 72 hours. CBG: Recent Labs  Lab 03/28/24 0902 03/28/24 1155 03/28/24 1404 03/28/24 1616  03/28/24 1759  GLUCAP 153* 140* 168* 182* 175*   Lipid Profile: No results for input(s): CHOL, HDL, LDLCALC, TRIG, CHOLHDL, LDLDIRECT in the last 72 hours. Thyroid  Function Tests: No results for input(s): TSH, T4TOTAL, FREET4, T3FREE, THYROIDAB in the last 72 hours. Anemia Panel: No results for input(s): VITAMINB12, FOLATE, FERRITIN, TIBC, IRON, RETICCTPCT in the last 72 hours. Sepsis Labs: Recent Labs  Lab 03/25/24 1820 03/25/24 2014 03/26/24 0355 03/27/24 2342  PROCALCITON  --   --  17.00 14.60  LATICACIDVEN 2.7* 3.4*  --  0.7    Recent Results (from the past 240 hours)  Blood culture (routine x 2)     Status: None (Preliminary result)   Collection Time: 03/25/24  6:17 PM   Specimen: Right Antecubital; Blood  Result Value Ref Range Status   Specimen Description   Final    RIGHT ANTECUBITAL Performed at [facility], [address] [place], [place] [address]    Special Requests   Final    BOTTLES DRAWN AEROBIC AND ANAEROBIC Blood Culture adequate volume Performed at [facility], [address] [place], [place] [address]    Culture   Final    NO GROWTH 2 DAYS Performed at [facility], [address] [place], [place] [address]    Report Status PENDING  Incomplete  Resp panel by RT-PCR (RSV, Flu A&B, Covid) Anterior Nasal Swab     Status: None   Collection Time: 03/25/24  8:14 PM   Specimen: Anterior Nasal Swab  Result Value Ref Range Status   SARS Coronavirus 2 by RT PCR NEGATIVE NEGATIVE Final    Comment: (NOTE) SARS-CoV-2 target nucleic acids are NOT DETECTED.  The SARS-CoV-2 RNA is generally detectable  in upper respiratory specimens during the acute phase of infection. The lowest concentration of SARS-CoV-2 viral copies this assay can detect is 138 copies/mL. A negative result does not preclude SARS-Cov-2 infection and should not be used as the sole basis for treatment or other  patient management decisions. A negative result may occur with  improper specimen collection/handling, submission of specimen other than nasopharyngeal swab, presence of viral mutation(s) within the areas targeted by this assay, and inadequate number of viral copies(<138 copies/mL). A negative result must be combined with clinical observations, patient history, and epidemiological information. The expected result is Negative.  Fact Sheet for Patients:  BloggerCourse.com  Fact Sheet for Healthcare Providers:  SeriousBroker.it  This test is no t yet approved or cleared by the [place]  FDA and  has been authorized for detection and/or diagnosis of SARS-CoV-2 by FDA under an Emergency Use Authorization (EUA). This EUA will remain  in effect (meaning this test can be used) for the duration of the COVID-19 declaration under Section 564(b)(1) of the Act, 21 U.S.C.section 360bbb-3(b)(1), unless the authorization is terminated  or revoked sooner.       Influenza A by PCR NEGATIVE NEGATIVE Final   Influenza B by PCR NEGATIVE NEGATIVE Final    Comment: (NOTE) The Xpert Xpress SARS-CoV-2/FLU/RSV plus assay is intended as an aid in the diagnosis of influenza from Nasopharyngeal swab specimens and should not be used as a sole basis for treatment. Nasal washings and aspirates are unacceptable for Xpert Xpress SARS-CoV-2/FLU/RSV testing.  Fact Sheet for Patients: BloggerCourse.com  Fact Sheet for Healthcare Providers: SeriousBroker.it  This test is not yet approved or cleared by the [place]  FDA and has been authorized for detection and/or diagnosis of SARS-CoV-2 by FDA under an Emergency Use Authorization (EUA). This EUA will remain in effect (meaning this test can be used) for the duration of the COVID-19 declaration under Section 564(b)(1) of the Act, 21 U.S.C. section  360bbb-3(b)(1), unless the authorization is terminated or revoked.     Resp Syncytial Virus by PCR NEGATIVE NEGATIVE Final    Comment: (NOTE) Fact Sheet for Patients: BloggerCourse.com  Fact Sheet for Healthcare Providers: SeriousBroker.it  This test is not yet approved or cleared by the [place]  FDA and has been authorized for detection and/or diagnosis of SARS-CoV-2 by FDA under an Emergency Use Authorization (EUA). This EUA will remain in effect (meaning this test can be used) for the duration of the COVID-19 declaration under Section 564(b)(1) of the Act, 21 U.S.C. section 360bbb-3(b)(1), unless the authorization is terminated or revoked.  Performed at [facility], [address] [place], [place] [address]   Blood culture (routine x 2)     Status: None (Preliminary result)   Collection Time: 03/25/24  9:14 PM   Specimen: BLOOD RIGHT HAND  Result Value Ref Range Status   Specimen Description   Final    BLOOD RIGHT HAND Performed at [facility], [address], [place], [place] [address]    Special Requests   Final    BOTTLES DRAWN AEROBIC AND ANAEROBIC Blood Culture results may not be optimal due to an inadequate volume of blood received in culture bottles Performed at [facility], [address] [place], [place] [address]    Culture   Final    NO GROWTH 2 DAYS Performed at [facility], [address] [place], [place] [address]    Report Status PENDING  Incomplete  Respiratory (~20 pathogens) panel by PCR  Status: None   Collection Time: 03/26/24  3:56 AM   Specimen: Nasopharyngeal Swab; Respiratory  Result Value Ref Range Status   Adenovirus NOT DETECTED NOT DETECTED Final   Coronavirus 229E NOT DETECTED NOT DETECTED Final    Comment: (NOTE) The Coronavirus on the Respiratory Panel, DOES NOT test for the novel  Coronavirus (2019 nCoV)     Coronavirus HKU1 NOT DETECTED NOT DETECTED Final   Coronavirus NL63 NOT DETECTED NOT DETECTED Final   Coronavirus OC43 NOT DETECTED NOT DETECTED Final   Metapneumovirus NOT DETECTED NOT DETECTED Final   Rhinovirus / Enterovirus NOT DETECTED NOT DETECTED Final   Influenza A NOT DETECTED NOT DETECTED Final   Influenza B NOT DETECTED NOT DETECTED Final   Parainfluenza Virus 1 NOT DETECTED NOT DETECTED Final   Parainfluenza Virus 2 NOT DETECTED NOT DETECTED Final   Parainfluenza Virus 3 NOT DETECTED NOT DETECTED Final   Parainfluenza Virus 4 NOT DETECTED NOT DETECTED Final   Respiratory Syncytial Virus NOT DETECTED NOT DETECTED Final   Bordetella pertussis NOT DETECTED NOT DETECTED Final   Bordetella Parapertussis NOT DETECTED NOT DETECTED Final   Chlamydophila pneumoniae NOT DETECTED NOT DETECTED Final   Mycoplasma pneumoniae NOT DETECTED NOT DETECTED Final    Comment: Performed at [facility], [address] [place], [place] [address]    Radiology Studies: IR TUNNELED CENTRAL VENOUS CATH [facility] W IMG Result Date: 03/28/2024 INDICATION: Patient with diabetes mellitus hospitalized ketoacidosis. Patient has underlying with fistula. Placement a tunneled PICC line for venous access. EXAM: TUNNELED PICC LINE WITH ULTRASOUND AND FLUOROSCOPIC GUIDANCE MEDICATIONS: Rocephin  1 gm IV. The antibiotic was given in an appropriate time interval prior to skin puncture. ANESTHESIA/SEDATION: Anxiolysis was employed during this procedure. A total of Versed  1 mg was administered intravenously by the radiology nurse. FLUOROSCOPY: Radiation Exposure Index (as provided by the fluoroscopic device): 4 mGy Kerma COMPLICATIONS: None immediate. PROCEDURE: Informed written consent was obtained from the patient after a discussion of the risks, benefits, and alternatives to treatment. Questions regarding the procedure were encouraged and answered. The right neck and chest were prepped with chlorhexidine  in a sterile  fashion, and a sterile drape was applied covering the operative field. Maximum barrier sterile technique with sterile gowns and gloves were used for the procedure. A timeout was performed prior to the initiation of the procedure. After creating a small venotomy incision, a micropuncture kit was utilized to access the right internal jugular vein under direct, real-time ultrasound guidance after the overlying soft tissues were anesthetized with 1% lidocaine  with epinephrine . Ultrasound image documentation was performed. The microwire was kinked to measure appropriate catheter length. The micropuncture sheath was exchanged for a peel-away sheath over a guidewire. A 5 French dual lumen tunneled PICC measuring cm was tunneled in a retrograde fashion from the anterior chest wall to the venotomy incision. The catheter was then placed through the peel-away sheath with tip ultimately positioned at the superior caval-atrial junction. Final catheter positioning was confirmed and documented with a spot radiographic image. The catheter aspirates and flushes normally. The catheter was flushed with appropriate volume heparin  dwells. The catheter exit site was secured with a 0-Prolene retention suture. The venotomy incision was closed with an interrupted 4-0 Vicryl, Dermabond and Steri-strips. Dressings were applied. The patient tolerated the procedure well without immediate post procedural complication. FINDINGS: After catheter placement, the tip lies within the superior cavoatrial junction. The catheter aspirates and flushes normally and is ready for immediate use. IMPRESSION: Successful placement of  26.5cm dual lumen tunneled PICC catheter via the right internal jugular vein with tip terminating at the superior caval atrial junction. The catheter is ready for immediate use. Electronically Signed   By: Cordella Banner   On: 03/28/2024 12:54   DG CHEST PORT 1 VIEW Result Date: 03/28/2024 CLINICAL DATA:  [id number] SOB (shortness of  breath) on exertion [id number] EXAM: PORTABLE CHEST - 1 VIEW COMPARISON:  March 25, 2024 FINDINGS: Interval development of interstitial opacities throughout both lungs with hazy airspace opacities in the right lung base. Small right pleural effusion. Mild cardiomegaly. No acute fracture or destructive lesion. IMPRESSION: Mild cardiomegaly with interstitial edema. Small right pleural effusion with right basilar airspace opacities, possibly atelectasis or asymmetric pulmonary edema. A superimposed bronchopneumonia could also have this appearance in the correct clinical context. Electronically Signed   By: Rogelia Myers M.D.   On: 03/28/2024 09:42   CT MAXILLOFACIAL WO CONTRAST Result Date: 03/27/2024 CLINICAL DATA:  Mastication paralysis/weakness EXAM: CT MAXILLOFACIAL WITHOUT CONTRAST TECHNIQUE: Multidetector CT imaging of the maxillofacial structures was performed. Multiplanar CT image reconstructions were also generated. RADIATION DOSE REDUCTION: This exam was performed according to the departmental dose-optimization program which includes automated exposure control, adjustment of the mA and/or kV according to patient size and/or use of iterative reconstruction technique. COMPARISON:  CT head 03/25/2024 FINDINGS: Osseous: No acute fracture. Orbits: Unremarkable. The previous hyperdensity in the left globe is no longer visualized. Sinuses: Paranasal sinuses and mastoid air cells are well aerated. Soft tissues: Subcutaneous edema and stranding about the lower face and chin. Correlate for cellulitis. Limited intracranial: Unremarkable. IMPRESSION: Subcutaneous edema and stranding about the lower face and chin. Correlate for cellulitis. No abscess. Electronically Signed   By: Norman Gatlin M.D.   On: 03/27/2024 22:30   ECHOCARDIOGRAM COMPLETE Result Date: 03/27/2024    ECHOCARDIOGRAM REPORT   Patient Name:   John [name] Date of Exam: 03/27/2024 Medical Rec #:  [id number]       Height:       70.0 in Accession #:     [id number]      Weight:       194.2 lb Date of Birth:  1991-04-11       BSA:          2.061 m Patient Age:    33 years        BP:           188/104 mmHg Patient Gender: M               HR:           107 bpm. Exam Location:  Inpatient Procedure: 2D Echo, Cardiac Doppler and Color Doppler (Both Spectral and Color            Flow Doppler were utilized during procedure). Indications:    Elevated Troponin  History:        Patient has no prior history of Echocardiogram examinations.                 Risk Factors:Hypertension and Diabetes.  Sonographer:    Jayson Gaskins Referring Phys: [id number] SUBRINA SUNDIL IMPRESSIONS  1. Left ventricular ejection fraction, by estimation, is 55 to 60%. The left ventricle has normal function. The left ventricle has no regional wall motion abnormalities. There is moderate left ventricular hypertrophy. Left ventricular diastolic parameters are indeterminate.  2. Right ventricular systolic function is normal. The right ventricular size is normal. Tricuspid regurgitation signal is inadequate for assessing PA pressure.  3. A small pericardial effusion is present.  4. The mitral valve is normal in structure. Trivial mitral valve regurgitation. No evidence of mitral stenosis.  5. The aortic valve is grossly normal. Aortic valve regurgitation is not visualized. No aortic stenosis is present.  6. The inferior vena cava is dilated in size with >50% respiratory variability, suggesting right atrial pressure of 8 mmHg. FINDINGS  Left Ventricle: Left ventricular ejection fraction, by estimation, is 55 to 60%. The left ventricle has normal function. The left ventricle has no regional wall motion abnormalities. The left ventricular internal cavity size was normal in size. There is  moderate left ventricular hypertrophy. Left ventricular diastolic parameters are indeterminate. Right Ventricle: The right ventricular size is normal. No increase in right ventricular wall thickness. Right ventricular systolic  function is normal. Tricuspid regurgitation signal is inadequate for assessing PA pressure. Left Atrium: Left atrial size was normal in size. Right Atrium: Right atrial size was normal in size. Pericardium: A small pericardial effusion is present. Mitral Valve: The mitral valve is normal in structure. Trivial mitral valve regurgitation. No evidence of mitral valve stenosis. Tricuspid Valve: The tricuspid valve is normal in structure. Tricuspid valve regurgitation is mild . No evidence of tricuspid stenosis. Aortic Valve: The aortic valve is grossly normal. Aortic valve regurgitation is not visualized. No aortic stenosis is present. Aortic valve mean gradient measures 5.0 mmHg. Aortic valve peak gradient measures 8.0 mmHg. Aortic valve area, by VTI measures 2.32 cm. Pulmonic Valve: The pulmonic valve was normal in structure. Pulmonic valve regurgitation is mild. No evidence of pulmonic stenosis. Aorta: The aortic root is normal in size and structure. Venous: The inferior vena cava is dilated in size with greater than 50% respiratory variability, suggesting right atrial pressure of 8 mmHg. IAS/Shunts: The interatrial septum was not well visualized.  LEFT VENTRICLE PLAX 2D LVIDd:         4.90 cm LVIDs:         3.50 cm LV PW:         1.50 cm LV IVS:        1.50 cm LVOT diam:     1.90 cm LV SV:         58 LV SV Index:   28 LVOT Area:     2.84 cm  RIGHT VENTRICLE RV S prime:     10.20 cm/s TAPSE (M-mode): 2.2 cm LEFT ATRIUM             Index        RIGHT ATRIUM           Index LA Vol (A2C):   46.0 ml 22.32 ml/m  RA Area:     17.00 cm LA Vol (A4C):   53.3 ml 25.86 ml/m  RA Volume:   38.10 ml  18.48 ml/m LA Biplane Vol: 52.0 ml 25.23 ml/m  AORTIC VALVE AV Area (Vmax):    2.27 cm AV Area (Vmean):   2.39 cm AV Area (VTI):     2.32 cm AV Vmax:           141.00 cm/s AV Vmean:          107.000 cm/s AV VTI:            0.252 m AV Peak Grad:      8.0 mmHg AV Mean Grad:      5.0 mmHg LVOT Vmax:         113.00 cm/s LVOT  Vmean:  90.300 cm/s LVOT VTI:          0.206 m LVOT/AV VTI ratio: 0.82  AORTA Ao Root diam: 2.60 cm MITRAL VALVE MV Area (PHT): 3.60 cm     SHUNTS MV Decel Time: 211 msec     Systemic VTI:  0.21 m MV E velocity: 144.00 cm/s  Systemic Diam: 1.90 cm Soyla Merck MD Electronically signed by Soyla Merck MD Signature Date/Time: 03/27/2024/3:02:05 PM    Final    Scheduled Meds:  amLODipine   10 mg Oral Daily   atropine   1 drop Left Eye Daily   Chlorhexidine  Gluconate Cloth  6 each Topical Q0600   erythromycin    Left Eye QHS   labetalol   100 mg Oral BID   pantoprazole  (PROTONIX ) IV  40 mg Intravenous Q12H   prednisoLONE  acetate  1 drop Left Eye Q8H   Followed by   [START ON 03/30/2024] prednisoLONE  acetate  1 drop Left Eye BID   Followed by   NOREEN ON 04/06/2024] prednisoLONE  acetate  1 drop Left Eye Daily   sodium chloride  flush  3 mL Intravenous Q12H   sodium chloride  flush  3 mL Intravenous Q12H   Continuous Infusions:  doxycycline  (VIBRAMYCIN ) IV 100 mg (03/28/24 1603)   insulin  2.4 Units/hr (03/28/24 1620)   piperacillin -tazobactam (ZOSYN )  IV 2.25 g (03/28/24 1609)    LOS: 2 days   Alejandro Marker, DO [facility] Available via Epic secure chat 7am-7pm After these hours, please refer to coverage provider listed on [email or web address] 03/28/2024, 6:08 PM

## 2024-03-29 DIAGNOSIS — D649 Anemia, unspecified: Secondary | ICD-10-CM | POA: Diagnosis not present

## 2024-03-29 DIAGNOSIS — I10 Essential (primary) hypertension: Secondary | ICD-10-CM | POA: Diagnosis not present

## 2024-03-29 DIAGNOSIS — A419 Sepsis, unspecified organism: Secondary | ICD-10-CM | POA: Diagnosis not present

## 2024-03-29 DIAGNOSIS — R918 Other nonspecific abnormal finding of lung field: Secondary | ICD-10-CM | POA: Diagnosis not present

## 2024-03-29 LAB — GLUCOSE, CAPILLARY
Glucose-Capillary: 133 mg/dL — ABNORMAL HIGH (ref 70–99)
Glucose-Capillary: 142 mg/dL — ABNORMAL HIGH (ref 70–99)
Glucose-Capillary: 143 mg/dL — ABNORMAL HIGH (ref 70–99)
Glucose-Capillary: 147 mg/dL — ABNORMAL HIGH (ref 70–99)
Glucose-Capillary: 151 mg/dL — ABNORMAL HIGH (ref 70–99)
Glucose-Capillary: 157 mg/dL — ABNORMAL HIGH (ref 70–99)
Glucose-Capillary: 157 mg/dL — ABNORMAL HIGH (ref 70–99)
Glucose-Capillary: 159 mg/dL — ABNORMAL HIGH (ref 70–99)
Glucose-Capillary: 162 mg/dL — ABNORMAL HIGH (ref 70–99)
Glucose-Capillary: 168 mg/dL — ABNORMAL HIGH (ref 70–99)
Glucose-Capillary: 173 mg/dL — ABNORMAL HIGH (ref 70–99)
Glucose-Capillary: 174 mg/dL — ABNORMAL HIGH (ref 70–99)
Glucose-Capillary: 175 mg/dL — ABNORMAL HIGH (ref 70–99)
Glucose-Capillary: 178 mg/dL — ABNORMAL HIGH (ref 70–99)
Glucose-Capillary: 190 mg/dL — ABNORMAL HIGH (ref 70–99)

## 2024-03-29 LAB — COMPREHENSIVE METABOLIC PANEL WITH GFR
ALT: 51 U/L — ABNORMAL HIGH (ref 0–44)
ALT: 52 U/L — ABNORMAL HIGH (ref 0–44)
AST: 14 U/L — ABNORMAL LOW (ref 15–41)
AST: 16 U/L (ref 15–41)
Albumin: 2.6 g/dL — ABNORMAL LOW (ref 3.5–5.0)
Albumin: 3 g/dL — ABNORMAL LOW (ref 3.5–5.0)
Alkaline Phosphatase: 67 U/L (ref 38–126)
Alkaline Phosphatase: 74 U/L (ref 38–126)
Anion gap: 19 — ABNORMAL HIGH (ref 5–15)
Anion gap: 19 — ABNORMAL HIGH (ref 5–15)
BUN: 48 mg/dL — ABNORMAL HIGH (ref 6–20)
BUN: 55 mg/dL — ABNORMAL HIGH (ref 6–20)
CO2: 20 mmol/L — ABNORMAL LOW (ref 22–32)
CO2: 22 mmol/L (ref 22–32)
Calcium: 7.6 mg/dL — ABNORMAL LOW (ref 8.9–10.3)
Calcium: 8.5 mg/dL — ABNORMAL LOW (ref 8.9–10.3)
Chloride: 99 mmol/L (ref 98–111)
Chloride: 95 mmol/L — ABNORMAL LOW (ref 98–111)
Creatinine, Ser: 7.2 mg/dL — ABNORMAL HIGH (ref 0.61–1.24)
Creatinine, Ser: 8.87 mg/dL — ABNORMAL HIGH (ref 0.61–1.24)
GFR, Estimated: 10 mL/min — ABNORMAL LOW (ref >60)
GFR, Estimated: 8 mL/min — ABNORMAL LOW (ref >60)
Glucose, Bld: 163 mg/dL — ABNORMAL HIGH (ref 70–99)
Glucose, Bld: 158 mg/dL — ABNORMAL HIGH (ref 70–99)
Potassium: 3.6 mmol/L (ref 3.5–5.1)
Potassium: 3.6 mmol/L (ref 3.5–5.1)
Sodium: 138 mmol/L (ref 135–145)
Sodium: 136 mmol/L (ref 135–145)
Total Bilirubin: 1.3 mg/dL — ABNORMAL HIGH (ref 0.0–1.2)
Total Bilirubin: 1.1 mg/dL (ref 0.0–1.2)
Total Protein: 5.1 g/dL — ABNORMAL LOW (ref 6.5–8.1)
Total Protein: 6.1 g/dL — ABNORMAL LOW (ref 6.5–8.1)

## 2024-03-29 LAB — CBC WITH DIFFERENTIAL/PLATELET
Abs Immature Granulocytes: 0.12 K/uL — ABNORMAL HIGH (ref 0.00–0.07)
Basophils Absolute: 0 K/uL (ref 0.0–0.1)
Basophils Relative: 1 %
Eosinophils Absolute: 0.3 K/uL (ref 0.0–0.5)
Eosinophils Relative: 3 %
HCT: 23.9 % — ABNORMAL LOW (ref 39.0–52.0)
Hemoglobin: 7.7 g/dL — ABNORMAL LOW (ref 13.0–17.0)
Immature Granulocytes: 2 %
Lymphocytes Relative: 13 %
Lymphs Abs: 1.1 K/uL (ref 0.7–4.0)
MCH: 31.7 pg (ref 26.0–34.0)
MCHC: 32.2 g/dL (ref 30.0–36.0)
MCV: 98.4 fL (ref 80.0–100.0)
Monocytes Absolute: 0.5 K/uL (ref 0.1–1.0)
Monocytes Relative: 7 %
Neutro Abs: 6.2 K/uL (ref 1.7–7.7)
Neutrophils Relative %: 74 %
Platelets: 278 K/uL (ref 150–400)
RBC: 2.43 MIL/uL — ABNORMAL LOW (ref 4.22–5.81)
RDW: 14.1 % (ref 11.5–15.5)
WBC: 8.2 K/uL (ref 4.0–10.5)
nRBC: 0 % (ref 0.0–0.2)

## 2024-03-29 LAB — RETICULOCYTES
Immature Retic Fract: 13.1 % (ref 2.3–15.9)
RBC.: 2.39 MIL/uL — ABNORMAL LOW (ref 4.22–5.81)
Retic Count, Absolute: 47.6 K/uL (ref 19.0–186.0)
Retic Ct Pct: 2 % (ref 0.4–3.1)

## 2024-03-29 LAB — PHOSPHORUS: Phosphorus: 4.8 mg/dL — ABNORMAL HIGH (ref 2.5–4.6)

## 2024-03-29 LAB — IRON AND TIBC
Iron: 58 ug/dL (ref 45–182)
Saturation Ratios: 32 % (ref 17.9–39.5)
TIBC: 181 ug/dL — ABNORMAL LOW (ref 250–450)
UIBC: 123 ug/dL

## 2024-03-29 LAB — VITAMIN B12: Vitamin B-12: 2933 pg/mL — ABNORMAL HIGH (ref 180–914)

## 2024-03-29 LAB — BETA-HYDROXYBUTYRIC ACID
Beta-Hydroxybutyric Acid: 3.02 mmol/L — ABNORMAL HIGH (ref 0.05–0.27)
Beta-Hydroxybutyric Acid: 1.56 mmol/L — ABNORMAL HIGH (ref 0.05–0.27)

## 2024-03-29 LAB — MAGNESIUM: Magnesium: 1.9 mg/dL (ref 1.7–2.4)

## 2024-03-29 LAB — FOLATE: Folate: 8.9 ng/mL (ref >5.9)

## 2024-03-29 LAB — FERRITIN: Ferritin: 977 ng/mL — ABNORMAL HIGH (ref 24–336)

## 2024-03-29 MED ORDER — INSULIN GLARGINE-YFGN 100 UNIT/ML ~~LOC~~ SOLN
15.0000 [IU] | Freq: Every day | SUBCUTANEOUS | Status: DC
  Administered 2024-03-29 – 2024-03-30 (×2): 15 [IU] via SUBCUTANEOUS
  Filled 2024-03-29 (×2): qty 0.15

## 2024-03-29 MED ORDER — ALTEPLASE 2 MG IJ SOLR: 2.0000 mg | Freq: Once | INTRAMUSCULAR | Status: DC | PRN

## 2024-03-29 MED ORDER — NEPRO/CARBSTEADY PO LIQD: 237.0000 mL | ORAL | Status: DC | PRN

## 2024-03-29 MED ORDER — INSULIN ASPART 100 UNIT/ML IJ SOLN
0.0000 [IU] | INTRAMUSCULAR | Status: DC
  Administered 2024-03-29 – 2024-03-30 (×3): 1 [IU] via SUBCUTANEOUS

## 2024-03-29 MED ORDER — ALUM & MAG HYDROXIDE-SIMETH 200-200-20 MG/5ML PO SUSP
30.0000 mL | ORAL | Status: DC | PRN
  Administered 2024-03-29: 30 mL via ORAL
  Filled 2024-03-29: qty 30

## 2024-03-29 MED ORDER — LIDOCAINE-PRILOCAINE 2.5-2.5 % EX CREA: 1.0000 | TOPICAL_CREAM | CUTANEOUS | Status: DC | PRN

## 2024-03-29 MED ORDER — HEPARIN SODIUM (PORCINE) 1000 UNIT/ML DIALYSIS: 1000.0000 [IU] | INTRAMUSCULAR | Status: DC | PRN

## 2024-03-29 MED ORDER — ANTICOAGULANT SODIUM CITRATE 4% (200MG/5ML) IV SOLN
5.0000 mL | Status: DC | PRN
  Filled 2024-03-29: qty 5

## 2024-03-29 MED ORDER — PENTAFLUOROPROP-TETRAFLUOROETH EX AERO: 1.0000 | INHALATION_SPRAY | CUTANEOUS | Status: DC | PRN

## 2024-03-29 MED ORDER — CHLORHEXIDINE GLUCONATE CLOTH 2 % EX PADS
6.0000 | MEDICATED_PAD | Freq: Every day | CUTANEOUS | Status: DC
  Administered 2024-03-30: 6 via TOPICAL

## 2024-03-29 NOTE — Plan of Care (Signed)
  Problem: Education: Goal: Knowledge of General Education information will improve Description: Including pain rating scale, medication(s)/side effects and non-pharmacologic comfort measures Outcome: Progressing   Problem: Health Behavior/Discharge Planning: Goal: Ability to manage health-related needs will improve Outcome: Progressing   Problem: Clinical Measurements: Goal: Ability to maintain clinical measurements within normal limits will improve Outcome: Progressing Goal: Will remain free from infection Outcome: Progressing   Problem: Education: Goal: Knowledge of General Education information will improve Description: Including pain rating scale, medication(s)/side effects and non-pharmacologic comfort measures Outcome: Progressing   Problem: Health Behavior/Discharge Planning: Goal: Ability to manage health-related needs will improve Outcome: Progressing   Problem: Clinical Measurements: Goal: Ability to maintain clinical measurements within normal limits will improve Outcome: Progressing Goal: Will remain free from infection Outcome: Progressing

## 2024-03-29 NOTE — Progress Notes (Signed)
 [facility] Progress Note   Subjective:    Seen and examined patient at bedside. Both patient and girlfriend expresses frustration and wanting to go home. Remains on insulin  drip. Discussed with Primary. Anticipated discharge over the weekend. Next HD 8/9.  Objective Vitals:   03/28/24 1940 03/28/24 2300 03/29/24 0316 03/29/24 0833  BP: (!) 196/117 (!) 176/102 (!) 179/97 (!) 165/104  Pulse: (!) 105 (!) 25 100 95  Resp: 20 15 20    Temp: 98.3 F (36.8 C) 98.2 F (36.8 C) 99.6 F (37.6 C) 98.3 F (36.8 C)  TempSrc: Oral Oral Oral Oral  SpO2: 92% 93% 98% 98%  Weight:      Height:       Physical Exam Gen alert, no distress, very tired Sclera anicteric No jvd or bruits Chest clear bilat to bases, no rales/ wheezing RRR no MRG Ext trace LE edema, no other edema Neuro is lethargic, easy to awaken, nf  AVF+ bruit   Filed Weights   03/26/24 0238  Weight: 88.1 kg    Intake/Output Summary (Last 24 hours) at 03/29/2024 1457 Last data filed at 03/29/2024 0841 Gross per 24 hour  Intake 60 ml  Output --  Net 60 ml    Additional Objective Labs: Basic Metabolic Panel: Recent Labs  Lab 03/27/24 0930 03/27/24 1153 03/27/24 2342 03/28/24 0315 03/28/24 1732 03/28/24 2209 03/29/24 0405  NA 134*   < > 135   < > 137 135 138  K 4.3   < > 4.0   < > 3.5 3.8 3.6  CL 92*   < > 94*   < > 100 97* 99  CO2 19*   < > 21*   < > 23 23 20*  GLUCOSE 241*   < > 143*   < > 161* 161* 163*  BUN 92*   < > 97*   < > 43* 47* 48*  CREATININE 9.58*   < > 10.43*   < > 6.52* 7.41* 7.20*  CALCIUM  8.1*   < > 8.1*   < > 7.5* 8.0* 7.6*  PHOS 7.0*  --  6.7*  --   --   --  4.8*   < > = values in this interval not displayed.   Liver Function Tests: Recent Labs  Lab 03/27/24 2342 03/28/24 2209 03/29/24 0405  AST 23 20 14*  ALT 72* 58* 51*  ALKPHOS 83 68 67  BILITOT 0.9 1.0 1.3*  PROT 5.5* 5.4* 5.1*  ALBUMIN  2.9* 2.8* 2.6*   No results for input(s): LIPASE, AMYLASE in the last  168 hours. CBC: Recent Labs  Lab 03/25/24 1820 03/26/24 0928 03/27/24 0930 03/27/24 2342 03/29/24 0405  WBC 15.0* 11.8* 10.5 8.9 8.2  NEUTROABS  --   --  8.5* 6.9 6.2  HGB 8.1* 7.4* 8.6* 7.9* 7.7*  HCT 24.2* 22.6* 26.3* 23.7* 23.9*  MCV 94.2 96.6 97.4 94.8 98.4  PLT 295 320 333 309 278   Blood Culture    Component Value Date/Time   SDES  03/25/2024 2114    BLOOD RIGHT HAND Performed at [facility], [address] [place], [place] [address]    [facility]  03/25/2024 2114    BOTTLES DRAWN AEROBIC AND ANAEROBIC Blood Culture results may not be optimal due to an inadequate volume of blood received in culture bottles Performed at [facility], [address] [place], [place] [address]    CULT  03/25/2024 2114    NO GROWTH 3 DAYS Performed at  [facility], [address] [place], [place] [address]    REPTSTATUS PENDING 03/25/2024 2114    Cardiac Enzymes: No results for input(s): CKTOTAL, CKMB, CKMBINDEX, TROPONINI in the last 168 hours. CBG: Recent Labs  Lab 03/29/24 0650 03/29/24 0828 03/29/24 0946 03/29/24 1058 03/29/24 1312  GLUCAP 178* 142* 159* 143* 147*   Iron Studies:  Recent Labs    03/29/24 0405  IRON 58  TIBC 181*  FERRITIN 977*   Lab Results  Component Value Date   INR 1.5 (H) 03/26/2024   INR 1.20 07/08/2014   Studies/Results: IR TUNNELED CENTRAL VENOUS CATH PLC W IMG Result Date: 03/28/2024 INDICATION: Patient with diabetes mellitus hospitalized ketoacidosis. Patient has underlying with fistula. Placement a tunneled PICC line for venous access. EXAM: TUNNELED PICC LINE WITH ULTRASOUND AND FLUOROSCOPIC GUIDANCE MEDICATIONS: Rocephin  1 gm IV. The antibiotic was given in an appropriate time interval prior to skin puncture. ANESTHESIA/SEDATION: Anxiolysis was employed during this procedure. A total of Versed  1 mg was administered intravenously by the radiology nurse. FLUOROSCOPY: Radiation  Exposure Index (as provided by the fluoroscopic device): 4 mGy Kerma COMPLICATIONS: None immediate. PROCEDURE: Informed written consent was obtained from the patient after a discussion of the risks, benefits, and alternatives to treatment. Questions regarding the procedure were encouraged and answered. The right neck and chest were prepped with chlorhexidine  in a sterile fashion, and a sterile drape was applied covering the operative field. Maximum barrier sterile technique with sterile gowns and gloves were used for the procedure. A timeout was performed prior to the initiation of the procedure. After creating a small venotomy incision, a micropuncture kit was utilized to access the right internal jugular vein under direct, real-time ultrasound guidance after the overlying soft tissues were anesthetized with 1% lidocaine  with epinephrine . Ultrasound image documentation was performed. The microwire was kinked to measure appropriate catheter length. The micropuncture sheath was exchanged for a peel-away sheath over a guidewire. A 5 French dual lumen tunneled PICC measuring cm was tunneled in a retrograde fashion from the anterior chest wall to the venotomy incision. The catheter was then placed through the peel-away sheath with tip ultimately positioned at the superior caval-atrial junction. Final catheter positioning was confirmed and documented with a spot radiographic image. The catheter aspirates and flushes normally. The catheter was flushed with appropriate volume heparin  dwells. The catheter exit site was secured with a 0-Prolene retention suture. The venotomy incision was closed with an interrupted 4-0 Vicryl, Dermabond and Steri-strips. Dressings were applied. The patient tolerated the procedure well without immediate post procedural complication. FINDINGS: After catheter placement, the tip lies within the superior cavoatrial junction. The catheter aspirates and flushes normally and is ready for immediate  use. IMPRESSION: Successful placement of 26.5cm dual lumen tunneled PICC catheter via the right internal jugular vein with tip terminating at the superior caval atrial junction. The catheter is ready for immediate use. Electronically Signed   By: Cordella Banner   On: 03/28/2024 12:54   DG CHEST PORT 1 VIEW Result Date: 03/28/2024 CLINICAL DATA:  [id number] SOB (shortness of breath) on exertion [id number] EXAM: PORTABLE CHEST - 1 VIEW COMPARISON:  March 25, 2024 FINDINGS: Interval development of interstitial opacities throughout both lungs with hazy airspace opacities in the right lung base. Small right pleural effusion. Mild cardiomegaly. No acute fracture or destructive lesion. IMPRESSION: Mild cardiomegaly with interstitial edema. Small right pleural effusion with right basilar airspace opacities, possibly atelectasis or asymmetric pulmonary edema. A superimposed bronchopneumonia could also have this  appearance in the correct clinical context. Electronically Signed   By: Rogelia Myers M.D.   On: 03/28/2024 09:42   CT MAXILLOFACIAL WO CONTRAST Result Date: 03/27/2024 CLINICAL DATA:  Mastication paralysis/weakness EXAM: CT MAXILLOFACIAL WITHOUT CONTRAST TECHNIQUE: Multidetector CT imaging of the maxillofacial structures was performed. Multiplanar CT image reconstructions were also generated. RADIATION DOSE REDUCTION: This exam was performed according to the departmental dose-optimization program which includes automated exposure control, adjustment of the mA and/or kV according to patient size and/or use of iterative reconstruction technique. COMPARISON:  CT head 03/25/2024 FINDINGS: Osseous: No acute fracture. Orbits: Unremarkable. The previous hyperdensity in the left globe is no longer visualized. Sinuses: Paranasal sinuses and mastoid air cells are well aerated. Soft tissues: Subcutaneous edema and stranding about the lower face and chin. Correlate for cellulitis. Limited intracranial: Unremarkable. IMPRESSION:  Subcutaneous edema and stranding about the lower face and chin. Correlate for cellulitis. No abscess. Electronically Signed   By: Norman Gatlin M.D.   On: 03/27/2024 22:30    Medications:  anticoagulant sodium citrate      doxycycline  (VIBRAMYCIN ) IV 100 mg (03/29/24 0349)   insulin  2 Units/hr (03/29/24 9348)   piperacillin -tazobactam (ZOSYN )  IV 2.25 g (03/29/24 1314)    amLODipine   10 mg Oral Daily   atropine   1 drop Left Eye Daily   Chlorhexidine  Gluconate Cloth  6 each Topical Q0600   erythromycin    Left Eye QHS   labetalol   100 mg Oral BID   pantoprazole  (PROTONIX ) IV  40 mg Intravenous Q12H   prednisoLONE  acetate  1 drop Left Eye Q8H   Followed by   NOREEN ON 03/30/2024] prednisoLONE  acetate  1 drop Left Eye BID   Followed by   NOREEN ON 04/06/2024] prednisoLONE  acetate  1 drop Left Eye Daily   sodium chloride  flush  3 mL Intravenous Q12H   sodium chloride  flush  3 mL Intravenous Q12H    Dialysis Orders: [facility] MWF 4h  B400   82.4kg  2K bath  AVF  Heparin  4000 Last OP HD 8/4, post wt 86.2kg (+3.8) Good compliance Last Mircera 03/25/24 and was given 75 mcg  Home bp meds: Norvasc  10 every day (ran out) Labetalol  100 bid  Assessment/Plan: DKA: On insulin  drip, per primary Sepsis: possible PNA , r/o acute cholecystitis. Normal HIDA scan and has absence of gallstones. CCS signed off. Cxr 8/6 showed small right pleura effusion with right basilar airspace opacities, possible atelectasis or pulmonary edema.  ESRD: on HD MWF.Off schedule while in hospital. Next HD 03/30/24.  HTN: home meds on hold for now, BP's wnl range. Follow.  Volume: up 5-6kg by wts, mild edema LE's, mild congestion on xray. Plan UF 2-3 L w/ next HD.  Anemia of esrd: 7- 9, tranfuse prn, will follow. Last ESA 03/25/24 Dispo - Anticipated discharge date hopefully over the weekend. Need to transition off insulin  drip first  Charmaine Piety, NP [facility] 03/29/2024,2:57 PM  LOS: 3 days

## 2024-03-29 NOTE — Plan of Care (Signed)

## 2024-03-29 NOTE — Progress Notes (Signed)
 PROGRESS NOTE    John [name]  FMW:[id number] DOB: [birth year] DOA: 03/25/2024 PCP: Beryl Donnice BRAVO, MD   Brief Narrative:  The patient is a 33 year old with a history of DM type I, HTN, hypertrophic cardiomyopathy, bilateral retinal detachment causing blindness, chronic anemia, and ESRD on HD TTS who was sent to the ER 8/4 PM from his dialysis unit where he was found to be confused following several preceding days notable for loss of appetite with multiple episodes of vomiting. Workup in the ER included CT chest and abdomen which suggested a possible pulmonary infiltrate. Abdominal ultrasound suggested gallbladder wall thickening but no Murphy sign. CT head was unrevealing.   General surgery evaluated and his HIDA scan showed filling of the gallbladder and this is not consistent with cholecystitis.  There is no gallstones noted and no right upper quadrant tenderness.  Surgery team did not feel he had an acute cholecystitis and do not recommend cholecystectomy at this time.  They have now signed off the case.  Pulmonary was also consulted for his right upper lobe groundglass opacity and they felt that this is likely aspiration or bacterial process.  They ordered an echocardiogram to rule out worsening MR and also ordered serologies to rule out connective tissue disease.  Patient remained in DKA so he is left on the insulin  drip for now.  Attempts were made to try and wean him off however his beta-hydroxybutyrate acid started trending up so this was discontinued canceled.  Patient is complaining of some facial swelling so we will obtain a maxillofacial CT scan and continue antibiotics with IV Zosyn  and doxycycline .  Nephrology was consulted for maintenance of hemodialysis and patient will be dialyzed yesterday with next session tomorrow.  His lab work is improving slowly but despite him having an elevated anion gap we will transition him off the insulin  drip to long-acting and monitor.  Will have  the patient ambulate today.  Assessment and Plan:  DKA without coma in DM 1: Beta hydroxybutyrate greater than 8 at presentation, following trend w/ ongoing insulin  tx.  Will continue insulin  drip for now and continue monitor beta-hydroxybutyrate trend.  Had trended down to 1.32 and was good to be transitioned however beta-hydroxybutyrate were back up to almost 3 so he was left on the insulin  drip. Beta-Hydroxybutryic Acid continues to be elevated but he is not having symptoms and has trended down to 1.56.  Will transition him off the insulin  drip as he was able to tolerate a soft diet and place him on a carb modified diet.  Will be transitioning him to 15 units of Semglee  daily and then placed on a very sensitive NovoLog /scale insulin  every 4h.  Given his diabetic management and improvement anticipating discharge in next 24 to 48 hours   Metabolic encephalopathy: Due to DKA - CT head without acute findings.  Mental status appears appropriate and much improved and he appears at Baseline   Aspiration pneumonia with possible sepsis POA: Patient was tachycardic and tachypneic at presentation but this of course could simply be due to his DKA -monitor for any further clinical evidence to support a diagnosis of pneumonia -empiric antibiotic for now with Doxy/Zosyn  - RVP negative - CoViD and Influenza negative -elevated procalcitonin not helpful in the setting of ESRD as it was 17.0 but nonetheless he remains on IV antibiotics with doxycycline  and Zosyn  and it is improving now (14.60).  Given his facial swelling we will obtained a Maxillofacial CT as below.  WBC has normalized and  is now 3.2  Facial swelling: Improving bilateral little bit worse on the left compared to the right.  ? if is related to periodontal disease.  Obtaining maxillofacial CT scan and showed Subcutaneous edema and stranding about the lower face and chin which was concerning for cellulitis but no abscess was seen. Continue Abx as above.     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 ECHO showed The mitral valve is normal in structure. Trivial mitral valve regurgitation. No evidence of mitral stenosis. Repeat CXR done and showed Mild cardiomegaly with interstitial edema. Small right pleural effusion with right basilar airspace opacities, possibly atelectasis or asymmetric pulmonary edema. A superimposed bronchopneumonia could also have this appearance in the correct clinical context.   Possible acute cholecystitis, ruled out: Ultrasound noted gallbladder wall thickening but no evidence of cholelithiasis -no Murphy's sign on ultrasound or physical exam - Gen Surgery following - HIDA scan showed filling of the gallbladder.   ESRD on HD TTS: Nephrology consulted to attend to ongoing dialysis. -BUN/Cr Trend: Recent Labs  Lab 03/27/24 2342 03/28/24 0315 03/28/24 1322 03/28/24 1732 03/28/24 2209 03/29/24 0405 03/29/24 1628  BUN 97* 34* 44* 43* 47* 48* 55*  CREATININE 10.43* 4.41* 6.60* 6.52* 7.41* 7.20* 8.87*  -Avoid Nephrotoxic Medications, Contrast Dyes, Hypotension and Dehydration to Ensure Adequate Renal Perfusion and will need to Renally Adjust Meds -Continue to Monitor and Trend Renal Function carefully and repeat CMP in the AM   HTN with Hypertrophic Cardiomyopathy: Patient is severely volume overloaded and blood pressures remain significantly elevated.  Continue as needed blood pressure medications and added IV labetalol . BP remains elevated but will resume home Amlodipine  10 mg po daily and Home Labetalol  100 mg po BID. CTM BP per Protocol. Last BP reading was improving and now 177/108  Volume Overload: In the setting of missed dialysis.  EF is 55 to 60% and patient does have indeterminate diastolic parameters.  Will need volume maintenance  with hemodialysis and patient dialyzed yesterday and had 3.5 Liters removed.  Next dialysis session is 03/30/2024   Elevated Troponin: In the setting of ESRD and DKA -no symptoms to suggest ACS -EKG without acute changes -elevated troponin likely due to poor clearance due to ESRD and demand ischemia - TTE pending - further eval will be required if patient develops chest pain or if there is a WMA on TTE    Anemia of Chronic Kidney Disease: Hgb/Hct trend:  Recent Labs  Lab 03/25/24 1806 03/25/24 1806 03/25/24 1820 03/26/24 0928 03/27/24 0930 03/27/24 2342 03/29/24 0405  HGB 17.0  --  8.1* 7.4* 8.6* 7.9* 7.7*  HCT 50.0  --  24.2* 22.6* 26.3* 23.7* 23.9*  MCV  --    < > 94.2 96.6 97.4 94.8 98.4   < > = values in this interval not displayed.  -Checked Anemia panel and showed an iron level of 58, UIBC 123, TIBC 181, saturation ratio 32%, ferritin of 977, folate of 8.9, vitamin B12 2020 33.  Continue monitor for signs of signs of bleeding; no overt bleeding noted. Repeat CBC in the AM - Repeat CBC in a.m.  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  Continue with eyedrops with erythromycin  ophthalmic ointment and prednisone acetate 1% ophthalmic suspension with the taper as written in [facility]  GERD/GI Prophylaxis: Initiate PPI with pantoprazole  IV 40 mg every 12h for now  Hypoalbuminemia: Patient's Albumin  Lvl went from 4.0 -> 2.8 -> 3.2 -> 2.9 -> 3.0. CTM and Trend and repeat CMP in the AM   DVT prophylaxis: SCDs Start: 03/26/24 0308 Place TED hose Start: 03/26/24 0308    Code Status: Full Code Family Communication: Discussed with the patient and his significant other at bedside  Disposition Plan:  Level of care: Progressive Status is: Inpatient Remains inpatient appropriate because: Needs further improvement and anticipate discharge in the next 24 to 48 hours   Consultants:   Nephrology PCCM/Pulmonary General Surgery  Procedures:  As delineated above  Antimicrobials:  Anti-infectives (From admission, onward)    Start     Dose/Rate Route Frequency Ordered Stop   03/26/24 0400  doxycycline  (VIBRAMYCIN ) 100 mg in sodium chloride  0.9 % 250 mL IVPB        100 mg 125 mL/hr over 120 Minutes Intravenous Every 12 hours 03/26/24 0307 04/02/24 0344   03/26/24 0400  piperacillin -tazobactam (ZOSYN ) IVPB 2.25 g        2.25 g 100 mL/hr over 30 Minutes Intravenous Every 8 hours 03/26/24 0307     03/25/24 1930  cefTRIAXone  (ROCEPHIN ) 1 g in sodium chloride  0.9 % 100 mL IVPB        1 g 200 mL/hr over 30 Minutes Intravenous  Once 03/25/24 1917 03/25/24 2057   03/25/24 1930  azithromycin  (ZITHROMAX ) 500 mg in sodium chloride  0.9 % 250 mL IVPB        500 mg 250 mL/hr over 60 Minutes Intravenous  Once 03/25/24 1917 03/25/24 2241       Subjective: Seen and examined at bedside was doing better and was not as nauseous and states that he has some back pain but not as bad.  Continues to complain of some reflux.  No chest pain or discomfort.  No abdominal discomfort.  Tolerated his clear liquid diet without issues and okay with advancing diet to soft.  No other concerns or complaints this time.  After discussion this afternoon with the nurse the patient was able to tolerate soft diet so he was advanced to carb modified diet and we will be weaning him off the insulin  drip  Objective: Vitals:   03/28/24 2300 03/29/24 0316 03/29/24 0833 03/29/24 1809  BP: (!) 176/102 (!) 179/97 (!) 165/104 (!) 177/108  Pulse: (!) 25 100 95 (!) 101  Resp: 15 20  20   Temp: 98.2 F (36.8 C) 99.6 F (37.6 C) 98.3 F (36.8 C) 97.7 F (36.5 C)  TempSrc: Oral Oral Oral Oral  SpO2: 93% 98% 98% 93%  Weight:      Height:        Intake/Output Summary (Last 24 hours) at 03/29/2024 1849 Last data filed at 03/29/2024 1323 Gross per 24 hour  Intake 60 ml  Output 300 ml  Net -240 ml   Filed Weights    03/26/24 0238  Weight: 88.1 kg   Examination: Physical Exam:  Constitutional: Chronically ill-appearing young Caucasian male who appears calm and in no acute distress Respiratory: Diminished to auscultation bilaterally with some coarse breath sounds, no wheezing, rales, rhonchi or crackles. Normal respiratory effort and patient is not tachypenic. No accessory muscle use.  Unlabored breathing Cardiovascular: Mildly tachycardic but regular rhythm., no murmurs / rubs / gallops. S1 and S2 auscultated.  Has a little bit lower extremity edema Abdomen: Soft, non-tender, non-distended. Bowel sounds positive.  GU: Deferred. Musculoskeletal: No clubbing /  cyanosis of digits/nails. No joint deformity upper and lower extremities.  Facial swelling has improved Skin: No rashes, lesions, ulcers on limited skin evaluation. No induration; Warm and dry.  Neurologic: CN 2-12 grossly intact with no focal deficits. Sensation intact in all 4 Extremities, DTR normal. Strength 5/5 in all 4. Romberg sign cerebellar reflexes not assessed.  Psychiatric: Normal judgment and insight. Alert and oriented x 3. Normal mood and appropriate affect.   Data Reviewed: I have personally reviewed following labs and imaging studies  CBC: Recent Labs  Lab 03/25/24 1820 03/26/24 0928 03/27/24 0930 03/27/24 2342 03/29/24 0405  WBC 15.0* 11.8* 10.5 8.9 8.2  NEUTROABS  --   --  8.5* 6.9 6.2  HGB 8.1* 7.4* 8.6* 7.9* 7.7*  HCT 24.2* 22.6* 26.3* 23.7* 23.9*  MCV 94.2 96.6 97.4 94.8 98.4  PLT 295 320 333 309 278   Basic Metabolic Panel: Recent Labs  Lab 03/25/24 1820 03/26/24 0355 03/27/24 0930 03/27/24 1153 03/27/24 2342 03/28/24 0315 03/28/24 1322 03/28/24 1732 03/28/24 2209 03/29/24 0405 03/29/24 1628  NA 139   < > 134*   < > 135   < > 136 137 135 138 136  K 4.9   < > 4.3   < > 4.0   < > 3.7 3.5 3.8 3.6 3.6  CL 86*   < > 92*   < > 94*   < > 96* 100 97* 99 95*  CO2 17*   < > 19*   < > 21*   < > 24 23 23  20* 22   GLUCOSE 439*   < > 241*   < > 143*   < > 148* 161* 161* 163* 158*  BUN 56*   < > 92*   < > 97*   < > 44* 43* 47* 48* 55*  CREATININE 6.14*   < > 9.58*   < > 10.43*   < > 6.60* 6.52* 7.41* 7.20* 8.87*  CALCIUM  8.7*   < > 8.1*   < > 8.1*   < > 8.2* 7.5* 8.0* 7.6* 8.5*  MG 2.4  --  2.3  --  2.3  --   --   --   --  1.9  --   PHOS  --   --  7.0*  --  6.7*  --   --   --   --  4.8*  --    < > = values in this interval not displayed.   GFR: Estimated Creatinine Clearance: 13.5 mL/min (A) (by C-G formula based on SCr of 8.87 mg/dL (H)). Liver Function Tests: Recent Labs  Lab 03/27/24 0930 03/27/24 2342 03/28/24 2209 03/29/24 0405 03/29/24 1628  AST 30 23 20  14* 16  ALT 83* 72* 58* 51* 52*  ALKPHOS 94 83 68 67 74  BILITOT 1.2 0.9 1.0 1.3* 1.1  PROT 6.1* 5.5* 5.4* 5.1* 6.1*  ALBUMIN  3.2* 2.9* 2.8* 2.6* 3.0*   No results for input(s): LIPASE, AMYLASE in the last 168 hours. No results for input(s): AMMONIA in the last 168 hours. Coagulation Profile: Recent Labs  Lab 03/26/24 0355  INR 1.5*   Cardiac Enzymes: No results for input(s): CKTOTAL, CKMB, CKMBINDEX, TROPONINI in the last 168 hours. BNP (last 3 results) No results for input(s): PROBNP in the last 8760 hours. HbA1C: No results for input(s): HGBA1C in the last 72 hours. CBG: Recent Labs  Lab 03/29/24 0946 03/29/24 1058 03/29/24 1312 03/29/24 1520 03/29/24 1639  GLUCAP 159* 143* 147* 175* 162*  Lipid Profile: No results for input(s): CHOL, HDL, LDLCALC, TRIG, CHOLHDL, LDLDIRECT in the last 72 hours. Thyroid  Function Tests: No results for input(s): TSH, T4TOTAL, FREET4, T3FREE, THYROIDAB in the last 72 hours. Anemia Panel: Recent Labs    03/29/24 0405  VITAMINB12 2,933*  FOLATE 8.9  FERRITIN 977*  TIBC 181*  IRON 58  RETICCTPCT 2.0   Sepsis Labs: Recent Labs  Lab 03/25/24 1820 03/25/24 2014 03/26/24 0355 03/27/24 2342  PROCALCITON  --   --  17.00 14.60   LATICACIDVEN 2.7* 3.4*  --  0.7   Recent Results (from the past 240 hours)  Blood culture (routine x 2)     Status: None (Preliminary result)   Collection Time: 03/25/24  6:17 PM   Specimen: Right Antecubital; Blood  Result Value Ref Range Status   Specimen Description   Final    RIGHT ANTECUBITAL Performed at [facility], [address], [place], [place] [address]    Special Requests   Final    BOTTLES DRAWN AEROBIC AND ANAEROBIC Blood Culture adequate volume Performed at [facility], [address], [place], [place] [address]    Culture   Final    NO GROWTH 3 DAYS Performed at [facility], [address] [place], [place] [address]    Report Status PENDING  Incomplete  Resp panel by RT-PCR (RSV, Flu A&B, Covid) Anterior Nasal Swab     Status: None   Collection Time: 03/25/24  8:14 PM   Specimen: Anterior Nasal Swab  Result Value Ref Range Status   SARS Coronavirus 2 by RT PCR NEGATIVE NEGATIVE Final    Comment: (NOTE) SARS-CoV-2 target nucleic acids are NOT DETECTED.  The SARS-CoV-2 RNA is generally detectable in upper respiratory specimens during the acute phase of infection. The lowest concentration of SARS-CoV-2 viral copies this assay can detect is 138 copies/mL. A negative result does not preclude SARS-Cov-2 infection and should not be used as the sole basis for treatment or other patient management decisions. A negative result may occur with  improper specimen collection/handling, submission of specimen other than nasopharyngeal swab, presence of viral mutation(s) within the areas targeted by this assay, and inadequate number of viral copies(<138 copies/mL). A negative result must be combined with clinical observations, patient history, and epidemiological information. The expected result is Negative.  Fact Sheet for Patients:  BloggerCourse.com  Fact Sheet for Healthcare Providers:   SeriousBroker.it  This test is no t yet approved or cleared by the [place]  FDA and  has been authorized for detection and/or diagnosis of SARS-CoV-2 by FDA under an Emergency Use Authorization (EUA). This EUA will remain  in effect (meaning this test can be used) for the duration of the COVID-19 declaration under Section 564(b)(1) of the Act, 21 U.S.C.section 360bbb-3(b)(1), unless the authorization is terminated  or revoked sooner.       Influenza A by PCR NEGATIVE NEGATIVE Final   Influenza B by PCR NEGATIVE NEGATIVE Final    Comment: (NOTE) The Xpert Xpress SARS-CoV-2/FLU/RSV plus assay is intended as an aid in the diagnosis of influenza from Nasopharyngeal swab specimens and should not be used as a sole basis for treatment. Nasal washings and aspirates are unacceptable for Xpert Xpress SARS-CoV-2/FLU/RSV testing.  Fact Sheet for Patients: BloggerCourse.com  Fact Sheet for Healthcare Providers: SeriousBroker.it  This test is not yet approved or cleared by the [place]  FDA and has been authorized for detection and/or diagnosis of SARS-CoV-2 by FDA under an Emergency Use  Authorization (EUA). This EUA will remain in effect (meaning this test can be used) for the duration of the COVID-19 declaration under Section 564(b)(1) of the Act, 21 U.S.C. section 360bbb-3(b)(1), unless the authorization is terminated or revoked.     Resp Syncytial Virus by PCR NEGATIVE NEGATIVE Final    Comment: (NOTE) Fact Sheet for Patients: BloggerCourse.com  Fact Sheet for Healthcare Providers: SeriousBroker.it  This test is not yet approved or cleared by the [place]  FDA and has been authorized for detection and/or diagnosis of SARS-CoV-2 by FDA under an Emergency Use Authorization (EUA). This EUA will remain in effect (meaning this test can be used) for  the duration of the COVID-19 declaration under Section 564(b)(1) of the Act, 21 U.S.C. section 360bbb-3(b)(1), unless the authorization is terminated or revoked.  Performed at [facility], [address], [place], [place] [address]   Blood culture (routine x 2)     Status: None (Preliminary result)   Collection Time: 03/25/24  9:14 PM   Specimen: BLOOD RIGHT HAND  Result Value Ref Range Status   Specimen Description   Final    BLOOD RIGHT HAND Performed at [facility], [address], [place], [place] [address]    Special Requests   Final    BOTTLES DRAWN AEROBIC AND ANAEROBIC Blood Culture results may not be optimal due to an inadequate volume of blood received in culture bottles Performed at [facility], [address], [place], [place] [address]    Culture   Final    NO GROWTH 3 DAYS Performed at [facility], [address], [place], [place] [address]    Report Status PENDING  Incomplete  Respiratory (~20 pathogens) panel by PCR     Status: None   Collection Time: 03/26/24  3:56 AM   Specimen: Nasopharyngeal Swab; Respiratory  Result Value Ref Range Status   Adenovirus NOT DETECTED NOT DETECTED Final   Coronavirus 229E NOT DETECTED NOT DETECTED Final    Comment: (NOTE) The Coronavirus on the Respiratory Panel, DOES NOT test for the novel  Coronavirus (2019 nCoV)    Coronavirus HKU1 NOT DETECTED NOT DETECTED Final   Coronavirus NL63 NOT DETECTED NOT DETECTED Final   Coronavirus OC43 NOT DETECTED NOT DETECTED Final   Metapneumovirus NOT DETECTED NOT DETECTED Final   Rhinovirus / Enterovirus NOT DETECTED NOT DETECTED Final   Influenza A NOT DETECTED NOT DETECTED Final   Influenza B NOT DETECTED NOT DETECTED Final   Parainfluenza Virus 1 NOT DETECTED NOT DETECTED Final   Parainfluenza Virus 2 NOT DETECTED NOT DETECTED Final   Parainfluenza Virus 3 NOT DETECTED NOT DETECTED Final   Parainfluenza Virus 4  NOT DETECTED NOT DETECTED Final   Respiratory Syncytial Virus NOT DETECTED NOT DETECTED Final   Bordetella pertussis NOT DETECTED NOT DETECTED Final   Bordetella Parapertussis NOT DETECTED NOT DETECTED Final   Chlamydophila pneumoniae NOT DETECTED NOT DETECTED Final   Mycoplasma pneumoniae NOT DETECTED NOT DETECTED Final    Comment: Performed at [facility], [address] [place], [place] [address]    Radiology Studies: IR TUNNELED CENTRAL VENOUS CATH [facility] W IMG Result Date: 03/28/2024 INDICATION: Patient with diabetes mellitus hospitalized ketoacidosis. Patient has underlying with fistula. Placement a tunneled PICC line for venous access. EXAM: TUNNELED PICC LINE WITH ULTRASOUND AND FLUOROSCOPIC GUIDANCE MEDICATIONS: Rocephin  1 gm IV. The antibiotic was given in an appropriate time interval prior to skin puncture. ANESTHESIA/SEDATION: Anxiolysis was employed during this procedure. A  total of Versed  1 mg was administered intravenously by the radiology nurse. FLUOROSCOPY: Radiation Exposure Index (as provided by the fluoroscopic device): 4 mGy Kerma COMPLICATIONS: None immediate. PROCEDURE: Informed written consent was obtained from the patient after a discussion of the risks, benefits, and alternatives to treatment. Questions regarding the procedure were encouraged and answered. The right neck and chest were prepped with chlorhexidine  in a sterile fashion, and a sterile drape was applied covering the operative field. Maximum barrier sterile technique with sterile gowns and gloves were used for the procedure. A timeout was performed prior to the initiation of the procedure. After creating a small venotomy incision, a micropuncture kit was utilized to access the right internal jugular vein under direct, real-time ultrasound guidance after the overlying soft tissues were anesthetized with 1% lidocaine  with epinephrine . Ultrasound image documentation was performed. The microwire was kinked to measure  appropriate catheter length. The micropuncture sheath was exchanged for a peel-away sheath over a guidewire. A 5 French dual lumen tunneled PICC measuring cm was tunneled in a retrograde fashion from the anterior chest wall to the venotomy incision. The catheter was then placed through the peel-away sheath with tip ultimately positioned at the superior caval-atrial junction. Final catheter positioning was confirmed and documented with a spot radiographic image. The catheter aspirates and flushes normally. The catheter was flushed with appropriate volume heparin  dwells. The catheter exit site was secured with a 0-Prolene retention suture. The venotomy incision was closed with an interrupted 4-0 Vicryl, Dermabond and Steri-strips. Dressings were applied. The patient tolerated the procedure well without immediate post procedural complication. FINDINGS: After catheter placement, the tip lies within the superior cavoatrial junction. The catheter aspirates and flushes normally and is ready for immediate use. IMPRESSION: Successful placement of 26.5cm dual lumen tunneled PICC catheter via the right internal jugular vein with tip terminating at the superior caval atrial junction. The catheter is ready for immediate use. Electronically Signed   By: Cordella Banner   On: 03/28/2024 12:54   DG CHEST PORT 1 VIEW Result Date: 03/28/2024 CLINICAL DATA:  [id number] SOB (shortness of breath) on exertion [id number] EXAM: PORTABLE CHEST - 1 VIEW COMPARISON:  March 25, 2024 FINDINGS: Interval development of interstitial opacities throughout both lungs with hazy airspace opacities in the right lung base. Small right pleural effusion. Mild cardiomegaly. No acute fracture or destructive lesion. IMPRESSION: Mild cardiomegaly with interstitial edema. Small right pleural effusion with right basilar airspace opacities, possibly atelectasis or asymmetric pulmonary edema. A superimposed bronchopneumonia could also have this appearance in the  correct clinical context. Electronically Signed   By: Rogelia Myers M.D.   On: 03/28/2024 09:42   CT MAXILLOFACIAL WO CONTRAST Result Date: 03/27/2024 CLINICAL DATA:  Mastication paralysis/weakness EXAM: CT MAXILLOFACIAL WITHOUT CONTRAST TECHNIQUE: Multidetector CT imaging of the maxillofacial structures was performed. Multiplanar CT image reconstructions were also generated. RADIATION DOSE REDUCTION: This exam was performed according to the departmental dose-optimization program which includes automated exposure control, adjustment of the mA and/or kV according to patient size and/or use of iterative reconstruction technique. COMPARISON:  CT head 03/25/2024 FINDINGS: Osseous: No acute fracture. Orbits: Unremarkable. The previous hyperdensity in the left globe is no longer visualized. Sinuses: Paranasal sinuses and mastoid air cells are well aerated. Soft tissues: Subcutaneous edema and stranding about the lower face and chin. Correlate for cellulitis. Limited intracranial: Unremarkable. IMPRESSION: Subcutaneous edema and stranding about the lower face and chin. Correlate for cellulitis. No abscess. Electronically Signed   By: Norman Charletta HERO.D.  On: 03/27/2024 22:30   Scheduled Meds:  amLODipine   10 mg Oral Daily   atropine   1 drop Left Eye Daily   [START ON 03/30/2024] Chlorhexidine  Gluconate Cloth  6 each Topical Q0600   erythromycin    Left Eye QHS   insulin  aspart  0-6 Units Subcutaneous Q4H   insulin  glargine-yfgn  15 Units Subcutaneous Daily   labetalol   100 mg Oral BID   pantoprazole  (PROTONIX ) IV  40 mg Intravenous Q12H   prednisoLONE  acetate  1 drop Left Eye Q8H   Followed by   [START ON 03/30/2024] prednisoLONE  acetate  1 drop Left Eye BID   Followed by   NOREEN ON 04/06/2024] prednisoLONE  acetate  1 drop Left Eye Daily   sodium chloride  flush  3 mL Intravenous Q12H   sodium chloride  flush  3 mL Intravenous Q12H   Continuous Infusions:  anticoagulant sodium citrate      doxycycline   (VIBRAMYCIN ) IV 100 mg (03/29/24 1536)   insulin  1.4 Units/hr (03/29/24 1645)   piperacillin -tazobactam (ZOSYN )  IV 2.25 g (03/29/24 1314)    LOS: 3 days   Alejandro Marker, DO [facility] Available via Epic secure chat 7am-7pm After these hours, please refer to coverage provider listed on [email or web address] 03/29/2024, 6:49 PM

## 2024-03-30 LAB — MAGNESIUM: Magnesium: 2.2 mg/dL (ref 1.7–2.4)

## 2024-03-30 LAB — GLUCOSE, CAPILLARY
Glucose-Capillary: 121 mg/dL — ABNORMAL HIGH (ref 70–99)
Glucose-Capillary: 147 mg/dL — ABNORMAL HIGH (ref 70–99)

## 2024-03-30 LAB — COMPREHENSIVE METABOLIC PANEL WITH GFR
ALT: 41 U/L (ref 0–44)
AST: 13 U/L — ABNORMAL LOW (ref 15–41)
Albumin: 2.8 g/dL — ABNORMAL LOW (ref 3.5–5.0)
Alkaline Phosphatase: 69 U/L (ref 38–126)
Anion gap: 17 — ABNORMAL HIGH (ref 5–15)
BUN: 59 mg/dL — ABNORMAL HIGH (ref 6–20)
CO2: 21 mmol/L — ABNORMAL LOW (ref 22–32)
Calcium: 8.2 mg/dL — ABNORMAL LOW (ref 8.9–10.3)
Chloride: 96 mmol/L — ABNORMAL LOW (ref 98–111)
Creatinine, Ser: 9.62 mg/dL — ABNORMAL HIGH (ref 0.61–1.24)
GFR, Estimated: 7 mL/min — ABNORMAL LOW (ref >60)
Glucose, Bld: 126 mg/dL — ABNORMAL HIGH (ref 70–99)
Potassium: 3.7 mmol/L (ref 3.5–5.1)
Sodium: 134 mmol/L — ABNORMAL LOW (ref 135–145)
Total Bilirubin: 0.9 mg/dL (ref 0.0–1.2)
Total Protein: 5.5 g/dL — ABNORMAL LOW (ref 6.5–8.1)

## 2024-03-30 LAB — CBC WITH DIFFERENTIAL/PLATELET
Abs Immature Granulocytes: 0.21 K/uL — ABNORMAL HIGH (ref 0.00–0.07)
Basophils Absolute: 0.1 K/uL (ref 0.0–0.1)
Basophils Relative: 1 %
Eosinophils Absolute: 0.6 K/uL — ABNORMAL HIGH (ref 0.0–0.5)
Eosinophils Relative: 6 %
HCT: 24.5 % — ABNORMAL LOW (ref 39.0–52.0)
Hemoglobin: 8.2 g/dL — ABNORMAL LOW (ref 13.0–17.0)
Immature Granulocytes: 2 %
Lymphocytes Relative: 11 %
Lymphs Abs: 1.1 K/uL (ref 0.7–4.0)
MCH: 31.7 pg (ref 26.0–34.0)
MCHC: 33.5 g/dL (ref 30.0–36.0)
MCV: 94.6 fL (ref 80.0–100.0)
Monocytes Absolute: 0.7 K/uL (ref 0.1–1.0)
Monocytes Relative: 7 %
Neutro Abs: 7 K/uL (ref 1.7–7.7)
Neutrophils Relative %: 73 %
Platelets: 337 K/uL (ref 150–400)
RBC: 2.59 MIL/uL — ABNORMAL LOW (ref 4.22–5.81)
RDW: 14.1 % (ref 11.5–15.5)
WBC: 9.7 K/uL (ref 4.0–10.5)
nRBC: 0.2 % (ref 0.0–0.2)

## 2024-03-30 LAB — PROCALCITONIN: Procalcitonin: 4.58 ng/mL

## 2024-03-30 LAB — PHOSPHORUS: Phosphorus: 5.7 mg/dL — ABNORMAL HIGH (ref 2.5–4.6)

## 2024-03-30 MED ORDER — LABETALOL HCL 200 MG PO TABS: 200.0000 mg | ORAL_TABLET | Freq: Two times a day (BID) | ORAL | Status: DC

## 2024-03-30 NOTE — Procedures (Signed)
  IR BRIEF PROGRESS NOTE:  Patient's care team advised IR that patient is intending on leaving AMA, and requesting removal of his tunneled central venous catheter. Patient was examined at bedside, and procedure explained to him and his wife. Patient was amenable to proceeding. Line was removed with little resistance. No bleeding noted, and sterile dressing was placed. RN advised of removal.   Electronically Signed: Carlin DELENA Griffon, PA-C 03/30/2024, 11:03 AM

## 2024-03-30 NOTE — Discharge Planning (Signed)
 [facility] Patient Discharge Orders- [facility] CLINIC: [place]. Patient left AMA FYI!!!!!!  Patient's name: John [name] Admit/DC Dates: 03/25/2024 - 03/30/2024  Discharge Diagnoses: DKA  PNA  Aranesp : Given: No     Last Hgb: 8.2 PRBC's Given: No  ESA dose for discharge: Resume mircera 75 mcg IV q 2 weeks  IV Iron dose at discharge: N/A  Heparin  change: No  EDW Change: No   Bath Change: No  Access intervention/Change: No Details:  Calcitriol change: No  Discharge Labs: Calcium  8.2  Phosphorus 5.7  Albumin  2.8  K+ 3.7  IV Antibiotics: No  On Coumadin?: No   OTHER/APPTS/LAB ORDERS:    D/C Meds to be reconciled by nurse after every discharge.  Completed By: Charmaine Piety, NP   Reviewed by: MD:______ RN_______

## 2024-03-30 NOTE — Evaluation (Signed)
 Occupational Therapy Evaluation & Discharge Patient Details Name: John [name] MRN: [id number] DOB: Oct 04, 1990 Today's Date: 03/30/2024   History of Present Illness   Pt is a 33 y.o male admitted 8/4 from dialysis for encephalopathy and vomiting. CT showed concerns for pneumonia and gallbladder wall thickening concern for cholecystitis.  PMH: DM,  bilateral retinal detachment causing blindness, ESRD, HTN, hypertrophic cardiomyopathy     Clinical Impressions Pt admitted based on above, and was seen based on problem list below. PTA pt was mod I with ADLs  with prn assistance for IADLs. Per pt's significant other, they have a system and home setup at home that allows him to be mod I with low vision. Today pt is at his functional baseline for ADLs. Pt able to don shoes and complete standing ADLs at mod I. Pt able to mobilize in hallway 100+ft, and complete a flight of stairs at mod I. Pt navigating hallway with obstacles well, little to no cues needed for vision. Pt and significant other reporting at baseline for ADLs and mobility, no concerns for d/c. No follow up OT or DME needs. All education complete, no further acute OT needs, OT is signing off on this pt.        If plan is discharge home, recommend the following:   Assistance with cooking/housework     Functional Status Assessment   Patient has not had a recent decline in their functional status     Equipment Recommendations   None recommended by OT      Precautions/Restrictions   Precautions Precautions: Fall Recall of Precautions/Restrictions: Intact Restrictions Weight Bearing Restrictions Per Provider Order: No     Mobility Bed Mobility   General bed mobility comments: Received in chair    Transfers Overall transfer level: Modified independent Equipment used: None     General transfer comment: Pt able to mobilize 156ft + navigate 4 steps to enter home with no LOB, naviagated obstacles well       Balance Overall balance assessment: No apparent balance deficits (not formally assessed)       ADL either performed or assessed with clinical judgement   ADL Overall ADL's : Modified independent;At baseline     General ADL Comments: No assist, pt able to don shoes, complete standing tasks     Vision Baseline Vision/History: 5 Retinopathy;2 Legally blind Patient Visual Report: No change from baseline Additional Comments: Pt overall WFL for tasks assessed, per pt vision is like looking underwater able to navigate obstacles in hallway with minimal cues            Pertinent Vitals/Pain Pain Assessment Pain Assessment: No/denies pain     Extremity/Trunk Assessment Upper Extremity Assessment Upper Extremity Assessment: Overall WFL for tasks assessed   Lower Extremity Assessment Lower Extremity Assessment: Overall WFL for tasks assessed   Cervical / Trunk Assessment Cervical / Trunk Assessment: Normal   Communication Communication Communication: No apparent difficulties   Cognition Arousal: Alert Behavior During Therapy: Restless Cognition: No apparent impairments     OT - Cognition Comments: Pt ready to d/c hospital requesting to sign AMA papers     Following commands: Intact       Cueing  General Comments   Cueing Techniques: Verbal cues  Pt and significant other requesting to sign AMA forms from RN, RN notified           Home Living Family/patient expects to be discharged to:: Private residence Living Arrangements: Spouse/significant other Available Help at Discharge: Family;Friend(s);Available 24  hours/day Type of Home: House Home Access: Stairs to enter [employer] of Steps: 4 Entrance Stairs-Rails: None Home Layout: One level     Bathroom Shower/Tub: Chief Strategy Officer: Standard Bathroom Accessibility: Yes How Accessible: Accessible via walker Home Equipment: Cane - single point   Additional Comments: Pt's  significant other works but has flexible schedule to be present when needed      Prior Functioning/Environment Prior Level of Function : Independent/Modified Independent             Mobility Comments: Ind without AD ADLs Comments: Pt mostly ind, home set up to aid with low vision    OT Problem List: Cardiopulmonary status limiting activity   OT Treatment/Interventions:        OT Goals(Current goals can be found in the care plan section)   Acute Rehab OT Goals Patient Stated Goal: To go home OT Goal Formulation: All assessment and education complete, DC therapy Time For Goal Achievement: 04/13/24 Potential to Achieve Goals: Good   AM-PAC OT 6 Clicks Daily Activity     Outcome Measure Help from another person eating meals?: None Help from another person taking care of personal grooming?: None Help from another person toileting, which includes using toliet, bedpan, or urinal?: None Help from another person bathing (including washing, rinsing, drying)?: None Help from another person to put on and taking off regular upper body clothing?: None Help from another person to put on and taking off regular lower body clothing?: None 6 Click Score: 24   End of Session Equipment Utilized During Treatment: Gait belt Nurse Communication: Mobility status  Activity Tolerance: Patient tolerated treatment well Patient left: in chair;with family/visitor present  OT Visit Diagnosis: Unsteadiness on feet (R26.81);Other abnormalities of gait and mobility (R26.89);Low vision, both eyes (H54.2)                Time: 9094-9081 OT Time Calculation (min): 13 min Charges:  OT General Charges $OT Visit: 1 Visit OT Evaluation $OT Eval Moderate Complexity: 1 Mod  Adrianne BROCKS, OT  Acute Rehabilitation Services Office [phone] Secure chat preferred   Adrianne GORMAN Savers 03/30/2024, 10:08 AM

## 2024-03-30 NOTE — Progress Notes (Addendum)
 [facility] Progress Note   Subjective:    Seen and examined patient at bedside. Insulin  drip is now off and on a diet. Both patient and girlfriend are eager to go home. HD scheduled for mid-day today; however, I was just informed patient may sign out AMA.  Objective Vitals:   03/29/24 2336 03/30/24 0357 03/30/24 0717 03/30/24 0800  BP: (!) 173/99 (!) 192/110 (!) 198/111   Pulse: 97 100    Resp: 20 20 19    Temp: 98.6 F (37 C) 98.8 F (37.1 C)  98.3 F (36.8 C)  TempSrc: Oral Oral  Oral  SpO2: 93% 99%  91%  Weight:      Height:       Physical Exam General: Awake, alert, NAD Heart: S1 and S2; No murmurs, gallops, or rubs Lungs: Clear throughout Abdomen: Soft and non-tender Extremities: Trace LE edema, no other edema appreciated Dialysis Access: AVF (+) B/T   Filed Weights   03/26/24 0238  Weight: 88.1 kg    Intake/Output Summary (Last 24 hours) at 03/30/2024 0941 Last data filed at 03/30/2024 0800 Gross per 24 hour  Intake 1895.79 ml  Output 300 ml  Net 1595.79 ml    Additional Objective Labs: Basic Metabolic Panel: Recent Labs  Lab 03/27/24 2342 03/28/24 0315 03/29/24 0405 03/29/24 1628 03/30/24 0445  NA 135   < > 138 136 134*  K 4.0   < > 3.6 3.6 3.7  CL 94*   < > 99 95* 96*  CO2 21*   < > 20* 22 21*  GLUCOSE 143*   < > 163* 158* 126*  BUN 97*   < > 48* 55* 59*  CREATININE 10.43*   < > 7.20* 8.87* 9.62*  CALCIUM  8.1*   < > 7.6* 8.5* 8.2*  PHOS 6.7*  --  4.8*  --  5.7*   < > = values in this interval not displayed.   Liver Function Tests: Recent Labs  Lab 03/29/24 0405 03/29/24 1628 03/30/24 0445  AST 14* 16 13*  ALT 51* 52* 41  ALKPHOS 67 74 69  BILITOT 1.3* 1.1 0.9  PROT 5.1* 6.1* 5.5*  ALBUMIN  2.6* 3.0* 2.8*   No results for input(s): LIPASE, AMYLASE in the last 168 hours. CBC: Recent Labs  Lab 03/26/24 0928 03/26/24 0928 03/27/24 0930 03/27/24 2342 03/29/24 0405 03/30/24 0445  WBC 11.8*  --  10.5 8.9 8.2 9.7   NEUTROABS  --    < > 8.5* 6.9 6.2 7.0  HGB 7.4*  --  8.6* 7.9* 7.7* 8.2*  HCT 22.6*  --  26.3* 23.7* 23.9* 24.5*  MCV 96.6  --  97.4 94.8 98.4 94.6  PLT 320  --  333 309 278 337   < > = values in this interval not displayed.   Blood Culture    Component Value Date/Time   SDES  03/25/2024 2114    BLOOD RIGHT HAND Performed at [facility], [address] [place], [place] [address]    [facility]  03/25/2024 2114    BOTTLES DRAWN AEROBIC AND ANAEROBIC Blood Culture results may not be optimal due to an inadequate volume of blood received in culture bottles Performed at [facility], [address] [place], [place] [address]    CULT  03/25/2024 2114    NO GROWTH 4 DAYS Performed at [facility], [address], [place], [place] [address]    REPTSTATUS PENDING 03/25/2024 2114    Cardiac Enzymes: No results  for input(s): CKTOTAL, CKMB, CKMBINDEX, TROPONINI in the last 168 hours. CBG: Recent Labs  Lab 03/29/24 1639 03/29/24 2029 03/29/24 2339 03/30/24 0400 03/30/24 0830  GLUCAP 162* 168* 190* 121* 147*   Iron Studies:  Recent Labs    03/29/24 0405  IRON 58  TIBC 181*  FERRITIN 977*   Lab Results  Component Value Date   INR 1.5 (H) 03/26/2024   INR 1.20 07/08/2014   Studies/Results: IR TUNNELED CENTRAL VENOUS CATH PLC W IMG Result Date: 03/28/2024 INDICATION: Patient with diabetes mellitus hospitalized ketoacidosis. Patient has underlying with fistula. Placement a tunneled PICC line for venous access. EXAM: TUNNELED PICC LINE WITH ULTRASOUND AND FLUOROSCOPIC GUIDANCE MEDICATIONS: Rocephin  1 gm IV. The antibiotic was given in an appropriate time interval prior to skin puncture. ANESTHESIA/SEDATION: Anxiolysis was employed during this procedure. A total of Versed  1 mg was administered intravenously by the radiology nurse. FLUOROSCOPY: Radiation Exposure Index (as provided by the fluoroscopic device): 4 mGy Kerma  COMPLICATIONS: None immediate. PROCEDURE: Informed written consent was obtained from the patient after a discussion of the risks, benefits, and alternatives to treatment. Questions regarding the procedure were encouraged and answered. The right neck and chest were prepped with chlorhexidine  in a sterile fashion, and a sterile drape was applied covering the operative field. Maximum barrier sterile technique with sterile gowns and gloves were used for the procedure. A timeout was performed prior to the initiation of the procedure. After creating a small venotomy incision, a micropuncture kit was utilized to access the right internal jugular vein under direct, real-time ultrasound guidance after the overlying soft tissues were anesthetized with 1% lidocaine  with epinephrine . Ultrasound image documentation was performed. The microwire was kinked to measure appropriate catheter length. The micropuncture sheath was exchanged for a peel-away sheath over a guidewire. A 5 French dual lumen tunneled PICC measuring cm was tunneled in a retrograde fashion from the anterior chest wall to the venotomy incision. The catheter was then placed through the peel-away sheath with tip ultimately positioned at the superior caval-atrial junction. Final catheter positioning was confirmed and documented with a spot radiographic image. The catheter aspirates and flushes normally. The catheter was flushed with appropriate volume heparin  dwells. The catheter exit site was secured with a 0-Prolene retention suture. The venotomy incision was closed with an interrupted 4-0 Vicryl, Dermabond and Steri-strips. Dressings were applied. The patient tolerated the procedure well without immediate post procedural complication. FINDINGS: After catheter placement, the tip lies within the superior cavoatrial junction. The catheter aspirates and flushes normally and is ready for immediate use. IMPRESSION: Successful placement of 26.5cm dual lumen tunneled  PICC catheter via the right internal jugular vein with tip terminating at the superior caval atrial junction. The catheter is ready for immediate use. Electronically Signed   By: Cordella Banner   On: 03/28/2024 12:54    Medications:  anticoagulant sodium citrate      doxycycline  (VIBRAMYCIN ) IV 100 mg (03/30/24 0437)   piperacillin -tazobactam (ZOSYN )  IV 2.25 g (03/30/24 0714)    amLODipine   10 mg Oral Daily   atropine   1 drop Left Eye Daily   Chlorhexidine  Gluconate Cloth  6 each Topical Q0600   erythromycin    Left Eye QHS   insulin  aspart  0-6 Units Subcutaneous Q4H   insulin  glargine-yfgn  15 Units Subcutaneous Daily   labetalol   200 mg Oral BID   pantoprazole  (PROTONIX ) IV  40 mg Intravenous Q12H   prednisoLONE  acetate  1 drop Left Eye BID   Followed by   [  START ON 04/06/2024] prednisoLONE  acetate  1 drop Left Eye Daily   sodium chloride  flush  3 mL Intravenous Q12H   sodium chloride  flush  3 mL Intravenous Q12H    Dialysis Orders: [facility] MWF 4h  B400   82.4kg  2K bath  AVF  Heparin  4000 Last OP HD 8/4, post wt 86.2kg (+3.8) Good compliance Last Mircera 03/25/24 and was given 75 mcg   Home bp meds: Norvasc  10 every day (ran out) Labetalol  100 bid  Assessment/Plan: DKA: insulin  drip is now off and on a diet, per primary Sepsis: possible PNA , r/o acute cholecystitis. Normal HIDA scan and has absence of gallstones. CCS signed off. Cxr 8/6 showed small right pleura effusion with right basilar airspace opacities, possible atelectasis or pulmonary edema.  ESRD: on HD MWF.Off schedule while in hospital. Next HD 03/30/24.  HTN: Bps are now up, continue home BP meds Volume: up 5-6kg by wts, mild edema LE's, mild congestion on xray. Plan UF 2-3 L w/ next HD.  Anemia of esrd: 7- 9, tranfuse prn, will follow. Last ESA 03/25/24 Dispo - Anticipated discharge date hopefully over the weekend. HD scheduled for mid-day today; however, I was just informed patient wants to leave AMA. We recommend  completing his HD at least before he leaves but patient is going to do what he wants to do.  Charmaine Piety, NP [facility] 03/30/2024,9:41 AM  LOS: 4 days

## 2024-03-30 NOTE — Progress Notes (Signed)
 PT Cancellation Note  Patient Details Name: John [name] MRN: [id number] DOB: 1991-05-02   Cancelled Treatment:    Reason Eval/Treat Not Completed: OT screened, no needs identified, will sign off (OT [employer] Reports that she did stairs/gait with pt and he was ind. Will screen at this time. Please re-consult if further needs arise.)  Dorothyann Maier, DPT, CLT  Acute Rehabilitation Services Office: [phone] (Secure chat preferred)   Dorothyann VEAR Maier 03/30/2024, 9:30 AM

## 2024-03-30 NOTE — Progress Notes (Signed)
 Pt expressed frustration about staying and has chosen to leave AMA. Discussed potential issues, health decline and possible death if he chooses to leave prior to discharge and being medically stable per MD. Patient and girlfriend expressed they are well aware of how to manage his DM and feel confident he will be fine until his HD appt. Monday @ 10am. Patient stated understanding of the potential risks associated with leaving AMA and still decided to sign the form. PICC removed prior to discharge and all questions discussed. MD aware.

## 2024-03-31 LAB — CULTURE, BLOOD (ROUTINE X 2)
Culture: NO GROWTH
Culture: NO GROWTH
Special Requests: ADEQUATE

## 2024-04-01 NOTE — Progress Notes (Signed)
 Late Note Entry- April 01, 2024  Pt left AMA on Saturday. Contacted [facility] this morning to be advised of pt's d/c date and that pt should resume care today.   Randine Mungo Dialysis Navigator [phone]

## 2024-04-01 NOTE — Discharge Summary (Signed)
 Physician Discharge Summary   Patient: John [name] MRN: [id number] DOB: 10-11-1990  Admit date:     03/25/2024  Discharge date: 03/30/2024  Discharge Physician: Alejandro Marker, DO   PCP: Beryl Donnice BRAVO, MD   Recommendations at discharge:   Follow-up in the outpatient setting with PCP within 1 to 2 weeks repeat CBC, CMP, mag, Phos within 1 week Follow-up with nephrology in outpatient setting  Discharge Diagnoses: Principal Problem:   Diabetic acidosis, type I (HCC) Active Problems:   Sepsis (HCC)   Essential hypertension   Insulin  dependent type 1 diabetes mellitus (HCC)   ESRD (end stage renal disease) (HCC)   Hypertrophic cardiomyopathy (HCC)   Chronic anemia   Acute cholecystitis   CAP (community acquired pneumonia)   Ground glass opacity present on imaging of lung  Resolved Problems:   * No resolved hospital problems. [facility] Course: The patient is a 33 year old with a history of DM type I, HTN, hypertrophic cardiomyopathy, bilateral retinal detachment causing blindness, chronic anemia, and ESRD on HD TTS who was sent to the ER 8/4 PM from his dialysis unit where he was found to be confused following several preceding days notable for loss of appetite with multiple episodes of vomiting. Workup in the ER included CT chest and abdomen which suggested a possible pulmonary infiltrate. Abdominal ultrasound suggested gallbladder wall thickening but no Murphy sign. CT head was unrevealing.   General surgery evaluated and his HIDA scan showed filling of the gallbladder and this is not consistent with cholecystitis.  There is no gallstones noted and no right upper quadrant tenderness.  Surgery team did not feel he had an acute cholecystitis and do not recommend cholecystectomy at this time.  They have now signed off the case.  Pulmonary was also consulted for his right upper lobe groundglass opacity and they felt that this is likely aspiration or bacterial process.  They ordered  an echocardiogram to rule out worsening MR and also ordered serologies to rule out connective tissue disease.  Patient remained in DKA so he is left on the insulin  drip for now.  Attempts were made to try and wean him off however his beta-hydroxybutyrate acid started trending up so this was discontinued canceled.  Patient is complaining of some facial swelling so we will obtain a maxillofacial CT scan and continue antibiotics with IV Zosyn  and doxycycline .  Nephrology was consulted for maintenance of hemodialysis and patient will be dialyzed yesterday with next session tomorrow.  His lab work is improving slowly but despite him having an elevated anion gap we will transition him off the insulin  drip to long-acting and monitor.  Will have the patient ambulate today.  Patient was transitioned off of insulin  drip and was to be dialyzed today but prior to dialysis he decided he no longer want to be hospitalized and decided to leave the hospital AGAINST MEDICAL ADVICE under his own accord being awake and alert and oriented.  Assessment and Plan:  DKA without coma in DM 1: Beta hydroxybutyrate greater than 8 at presentation, following trend w/ ongoing insulin  tx.  Will continue insulin  drip for now and continue monitor beta-hydroxybutyrate trend.  Had trended down to 1.32 and was good to be transitioned however beta-hydroxybutyrate were back up to almost 3 so he was left on the insulin  drip. Beta-Hydroxybutryic Acid continues to be elevated but he is not having symptoms and has trended down to 1.56.  Will transition him off the insulin  drip as he was able to  tolerate a soft diet and place him on a carb modified diet.  Will be transitioning him to 15 units of Semglee  daily and then placed on a very sensitive NovoLog /scale insulin  every 4h.  Given his diabetic management and improvement patient was possibly to be discharged later today or tomorrow but he signed out AMA prior to getting dialysis   Metabolic  encephalopathy: Due to DKA - CT head without acute findings.  Mental status appears appropriate and much improved and he appears at Baseline   Aspiration pneumonia with possible sepsis POA: Patient was tachycardic and tachypneic at presentation but this of course could simply be due to his DKA -monitor for any further clinical evidence to support a diagnosis of pneumonia -empiric antibiotic for now with Doxy/Zosyn  - RVP negative - CoViD and Influenza negative -elevated procalcitonin not helpful in the setting of ESRD as it was 17.0 but nonetheless he remains on IV antibiotics with doxycycline  and Zosyn  and it is improving now (4.58).  Given his facial swelling we will obtained a Maxillofacial CT as below.  WBC has normalized and is now 9.7  Facial swelling: Improving bilateral little bit worse on the left compared to the right.  ? if is related to periodontal disease.  Obtaining maxillofacial CT scan and showed Subcutaneous edema and stranding about the lower face and chin which was concerning for cellulitis but no abscess was seen. Continue Abx as above but he signed out AMA.    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 ECHO showed The mitral valve is normal in structure. Trivial mitral valve regurgitation. No evidence of mitral stenosis. Repeat CXR done and showed Mild cardiomegaly with interstitial edema. Small right pleural effusion with right basilar airspace opacities, possibly atelectasis or asymmetric pulmonary edema. A superimposed bronchopneumonia could also have this appearance in the correct clinical context.  Will do an amatory home O2 screen but he signed out AMA   Possible acute cholecystitis, ruled out: Ultrasound noted gallbladder wall thickening but no evidence of cholelithiasis -no Murphy's sign on  ultrasound or physical exam - Gen Surgery following - HIDA scan showed filling of the gallbladder.   ESRD on HD TTS: Nephrology consulted to attend to ongoing dialysis. -BUN/Cr Trend: Recent Labs  Lab 03/28/24 0315 03/28/24 1322 03/28/24 1732 03/28/24 2209 03/29/24 0405 03/29/24 1628 03/30/24 0445  BUN 34* 44* 43* 47* 48* 55* 59*  CREATININE 4.41* 6.60* 6.52* 7.41* 7.20* 8.87* 9.62*  -Avoid Nephrotoxic Medications, Contrast Dyes, Hypotension and Dehydration to Ensure Adequate Renal Perfusion and will need to Renally Adjust Meds -Continue to Monitor and Trend Renal Function carefully and repeat CMP in the AM   HTN with Hypertrophic Cardiomyopathy: Patient is severely volume overloaded and blood pressures remain significantly elevated.  Continue as needed blood pressure medications and added IV labetalol . BP remains elevated but will resume home Amlodipine  10 mg po daily and Home Labetalol  100 mg po BID. CTM BP per Protocol. Last BP reading was improving and now 177/108  Volume Overload: In the setting of missed dialysis.  EF is 55 to 60% and patient does have indeterminate diastolic parameters.  Will need volume maintenance with hemodialysis and patient dialyzed yesterday and had 3.5 Liters removed.  Next dialysis session was this a.m. on 03/30/2024 but he signed out AMA   Elevated Troponin: In the setting of ESRD and DKA -no symptoms  to suggest ACS -EKG without acute changes -elevated troponin likely due to poor clearance due to ESRD and demand ischemia - TTE pending - further eval will be required if patient develops chest pain or if there is a WMA on TTE    Anemia of Chronic Kidney Disease: Hgb/Hct trend:  Recent Labs  Lab 03/25/24 1806 03/25/24 1806 03/25/24 1820 03/26/24 0928 03/27/24 0930 03/27/24 2342 03/29/24 0405 03/30/24 0445  HGB 17.0  --  8.1* 7.4* 8.6* 7.9* 7.7* 8.2*  HCT 50.0  --  24.2* 22.6* 26.3* 23.7* 23.9* 24.5*  MCV  --    < > 94.2 96.6 97.4 94.8 98.4 94.6   < > =  values in this interval not displayed.  -Checked Anemia panel and showed an iron level of 58, UIBC 123, TIBC 181, saturation ratio 32%, ferritin of 977, folate of 8.9, vitamin B12 2020 33.  Continue monitor for signs of signs of bleeding; no overt bleeding noted. Repeat CBC in the AM - Repeat CBC in a.m.  Left eye Retinal Detachment and Vitreous Hemorrhage: Recently seen by the [facility] and around the retina clinic on 03/20/2024.  Continue with eyedrops with erythromycin  ophthalmic ointment and prednisone acetate 1% ophthalmic suspension with the taper as written in [facility]  GERD/GI Prophylaxis: Initiate PPI with pantoprazole  IV 40 mg every 12h for now  Hypoalbuminemia: Patient's Albumin  Lvl went from 4.0 -> 2.8 -> 3.2 -> 2.9 -> 3.0 it was 2.8.. CTM and Trend and repeat CMP in the AM  Consultants: Nephrology Procedures performed: As delineated as above Disposition: Patient left the hospital AGAINST MEDICAL ADVICE Diet recommendation:  Renal diet DISCHARGE MEDICATION: Allergies as of 03/30/2024       Reactions   Wound Dressing Adhesive Other (See Comments)   Blisters         Medication List     ASK your doctor about these medications    acetaminophen  500 MG tablet Commonly known as: TYLENOL  Take 1,000 mg by mouth every 6 (six) hours as needed for mild pain or moderate pain.   amLODipine  10 MG tablet Commonly known as: NORVASC  Take 1 tablet (10 mg total) by mouth daily.   atropine  1 % ophthalmic solution Place 1 drop into the left eye daily.   Baqsimi One Pack 3 MG/DOSE Powd Generic drug: Glucagon Place 1 spray into the nose as needed (hypoglycemic episode).   Basaglar  KwikPen 100 UNIT/ML Inject 18 Units into the skin at bedtime.   bismuth subsalicylate 262 MG chewable tablet Commonly known as: PEPTO BISMOL Chew 524 mg by mouth daily as needed for indigestion or diarrhea or loose stools.   cetirizine  10 MG tablet Commonly known as: ZYRTEC  Take 10 mg by mouth  daily.   diphenhydrAMINE  25 MG tablet Commonly known as: BENADRYL  Take 25 mg by mouth at bedtime.   labetalol  100 MG tablet Commonly known as: NORMODYNE  Take 1 tablet (100 mg total) by mouth 2 (two) times daily.   Melatonin 10 MG Chew Chew 10-20 mg by mouth at bedtime as needed (Sleep).   NovoLOG  FlexPen 100 UNIT/ML FlexPen Generic drug: insulin  aspart Inject 3-6 Units into the skin 3 (three) times daily with meals.   prednisoLONE  acetate 1 % ophthalmic suspension Commonly known as: PRED FORTE  Apply 1 drop to eye See admin instructions.  Place 1 drop into the left eye as directed for 28 days 4x/day for 1 wk, then 3x/day for 1 wk, then 2x/day for 1 wk, then 1x/day for 1 wk, then  STOP   PRESERVISION AREDS 2 PO Take 1 tablet by mouth in the morning.   sevelamer carbonate 800 MG tablet Commonly known as: RENVELA Take 2,400 mg by mouth See admin instructions. Take 3 tablets (2400 mg) by mouth with each meal & with each snack   simethicone  80 MG chewable tablet Commonly known as: MYLICON Chew 80 mg by mouth every 6 (six) hours as needed for flatulence.   TUMS PO Take 1 tablet by mouth 2 (two) times daily as needed (heartburn).   ZINC PO Take 1 tablet by mouth daily.       Discharge Exam: Filed Weights   03/26/24 0238  Weight: 88.1 kg   Vitals:   03/30/24 0717 03/30/24 0800  BP: (!) 198/111   Pulse:    Resp: 19   Temp:  98.3 F (36.8 C)  SpO2:  91%   NO PHYSICAL EXAM AS HE LEFT AMA PRIOR TO BEING SEEN  Condition at discharge: Guarded  The results of significant diagnostics from this hospitalization (including imaging, microbiology, ancillary and laboratory) are listed below for reference.   Imaging Studies: IR TUNNELED CENTRAL VENOUS CATH [facility] W IMG Result Date: 03/28/2024 INDICATION: Patient with diabetes mellitus hospitalized ketoacidosis. Patient has underlying with fistula. Placement a tunneled PICC line for venous access. EXAM: TUNNELED PICC LINE WITH  ULTRASOUND AND FLUOROSCOPIC GUIDANCE MEDICATIONS: Rocephin  1 gm IV. The antibiotic was given in an appropriate time interval prior to skin puncture. ANESTHESIA/SEDATION: Anxiolysis was employed during this procedure. A total of Versed  1 mg was administered intravenously by the radiology nurse. FLUOROSCOPY: Radiation Exposure Index (as provided by the fluoroscopic device): 4 mGy Kerma COMPLICATIONS: None immediate. PROCEDURE: Informed written consent was obtained from the patient after a discussion of the risks, benefits, and alternatives to treatment. Questions regarding the procedure were encouraged and answered. The right neck and chest were prepped with chlorhexidine  in a sterile fashion, and a sterile drape was applied covering the operative field. Maximum barrier sterile technique with sterile gowns and gloves were used for the procedure. A timeout was performed prior to the initiation of the procedure. After creating a small venotomy incision, a micropuncture kit was utilized to access the right internal jugular vein under direct, real-time ultrasound guidance after the overlying soft tissues were anesthetized with 1% lidocaine  with epinephrine . Ultrasound image documentation was performed. The microwire was kinked to measure appropriate catheter length. The micropuncture sheath was exchanged for a peel-away sheath over a guidewire. A 5 French dual lumen tunneled PICC measuring cm was tunneled in a retrograde fashion from the anterior chest wall to the venotomy incision. The catheter was then placed through the peel-away sheath with tip ultimately positioned at the superior caval-atrial junction. Final catheter positioning was confirmed and documented with a spot radiographic image. The catheter aspirates and flushes normally. The catheter was flushed with appropriate volume heparin  dwells. The catheter exit site was secured with a 0-Prolene retention suture. The venotomy incision was closed with an  interrupted 4-0 Vicryl, Dermabond and Steri-strips. Dressings were applied. The patient tolerated the procedure well without immediate post procedural complication. FINDINGS: After catheter placement, the tip lies within the superior cavoatrial junction. The catheter aspirates and flushes normally and is ready for immediate use. IMPRESSION: Successful placement of 26.5cm dual lumen tunneled PICC catheter via the right internal jugular vein with tip terminating at the superior caval atrial junction. The catheter is ready for immediate use. Electronically Signed   By: Cordella Banner   On: 03/28/2024 12:54  DG CHEST PORT 1 VIEW Result Date: 03/28/2024 CLINICAL DATA:  [id number] SOB (shortness of breath) on exertion [id number] EXAM: PORTABLE CHEST - 1 VIEW COMPARISON:  March 25, 2024 FINDINGS: Interval development of interstitial opacities throughout both lungs with hazy airspace opacities in the right lung base. Small right pleural effusion. Mild cardiomegaly. No acute fracture or destructive lesion. IMPRESSION: Mild cardiomegaly with interstitial edema. Small right pleural effusion with right basilar airspace opacities, possibly atelectasis or asymmetric pulmonary edema. A superimposed bronchopneumonia could also have this appearance in the correct clinical context. Electronically Signed   By: Rogelia Myers M.D.   On: 03/28/2024 09:42   CT MAXILLOFACIAL WO CONTRAST Result Date: 03/27/2024 CLINICAL DATA:  Mastication paralysis/weakness EXAM: CT MAXILLOFACIAL WITHOUT CONTRAST TECHNIQUE: Multidetector CT imaging of the maxillofacial structures was performed. Multiplanar CT image reconstructions were also generated. RADIATION DOSE REDUCTION: This exam was performed according to the departmental dose-optimization program which includes automated exposure control, adjustment of the mA and/or kV according to patient size and/or use of iterative reconstruction technique. COMPARISON:  CT head 03/25/2024 FINDINGS: Osseous: No  acute fracture. Orbits: Unremarkable. The previous hyperdensity in the left globe is no longer visualized. Sinuses: Paranasal sinuses and mastoid air cells are well aerated. Soft tissues: Subcutaneous edema and stranding about the lower face and chin. Correlate for cellulitis. Limited intracranial: Unremarkable. IMPRESSION: Subcutaneous edema and stranding about the lower face and chin. Correlate for cellulitis. No abscess. Electronically Signed   By: Norman Gatlin M.D.   On: 03/27/2024 22:30   ECHOCARDIOGRAM COMPLETE Result Date: 03/27/2024    ECHOCARDIOGRAM REPORT   Patient Name:   EGOR FULLILOVE Date of Exam: 03/27/2024 Medical Rec #:  [id number]       Height:       70.0 in Accession #:    [id number]      Weight:       194.2 lb Date of Birth:  1990/09/08       BSA:          2.061 m Patient Age:    33 years        BP:           188/104 mmHg Patient Gender: M               HR:           107 bpm. Exam Location:  Inpatient Procedure: 2D Echo, Cardiac Doppler and Color Doppler (Both Spectral and Color            Flow Doppler were utilized during procedure). Indications:    Elevated Troponin  History:        Patient has no prior history of Echocardiogram examinations.                 Risk Factors:Hypertension and Diabetes.  Sonographer:    Jayson Gaskins Referring Phys: [id number] SUBRINA SUNDIL IMPRESSIONS  1. Left ventricular ejection fraction, by estimation, is 55 to 60%. The left ventricle has normal function. The left ventricle has no regional wall motion abnormalities. There is moderate left ventricular hypertrophy. Left ventricular diastolic parameters are indeterminate.  2. Right ventricular systolic function is normal. The right ventricular size is normal. Tricuspid regurgitation signal is inadequate for assessing PA pressure.  3. A small pericardial effusion is present.  4. The mitral valve is normal in structure. Trivial mitral valve regurgitation. No evidence of mitral stenosis.  5. The aortic valve is grossly  normal. Aortic valve regurgitation is not visualized. No aortic stenosis  is present.  6. The inferior vena cava is dilated in size with >50% respiratory variability, suggesting right atrial pressure of 8 mmHg. FINDINGS  Left Ventricle: Left ventricular ejection fraction, by estimation, is 55 to 60%. The left ventricle has normal function. The left ventricle has no regional wall motion abnormalities. The left ventricular internal cavity size was normal in size. There is  moderate left ventricular hypertrophy. Left ventricular diastolic parameters are indeterminate. Right Ventricle: The right ventricular size is normal. No increase in right ventricular wall thickness. Right ventricular systolic function is normal. Tricuspid regurgitation signal is inadequate for assessing PA pressure. Left Atrium: Left atrial size was normal in size. Right Atrium: Right atrial size was normal in size. Pericardium: A small pericardial effusion is present. Mitral Valve: The mitral valve is normal in structure. Trivial mitral valve regurgitation. No evidence of mitral valve stenosis. Tricuspid Valve: The tricuspid valve is normal in structure. Tricuspid valve regurgitation is mild . No evidence of tricuspid stenosis. Aortic Valve: The aortic valve is grossly normal. Aortic valve regurgitation is not visualized. No aortic stenosis is present. Aortic valve mean gradient measures 5.0 mmHg. Aortic valve peak gradient measures 8.0 mmHg. Aortic valve area, by VTI measures 2.32 cm. Pulmonic Valve: The pulmonic valve was normal in structure. Pulmonic valve regurgitation is mild. No evidence of pulmonic stenosis. Aorta: The aortic root is normal in size and structure. Venous: The inferior vena cava is dilated in size with greater than 50% respiratory variability, suggesting right atrial pressure of 8 mmHg. IAS/Shunts: The interatrial septum was not well visualized.  LEFT VENTRICLE PLAX 2D LVIDd:         4.90 cm LVIDs:         3.50 cm LV PW:          1.50 cm LV IVS:        1.50 cm LVOT diam:     1.90 cm LV SV:         58 LV SV Index:   28 LVOT Area:     2.84 cm  RIGHT VENTRICLE RV S prime:     10.20 cm/s TAPSE (M-mode): 2.2 cm LEFT ATRIUM             Index        RIGHT ATRIUM           Index LA Vol (A2C):   46.0 ml 22.32 ml/m  RA Area:     17.00 cm LA Vol (A4C):   53.3 ml 25.86 ml/m  RA Volume:   38.10 ml  18.48 ml/m LA Biplane Vol: 52.0 ml 25.23 ml/m  AORTIC VALVE AV Area (Vmax):    2.27 cm AV Area (Vmean):   2.39 cm AV Area (VTI):     2.32 cm AV Vmax:           141.00 cm/s AV Vmean:          107.000 cm/s AV VTI:            0.252 m AV Peak Grad:      8.0 mmHg AV Mean Grad:      5.0 mmHg LVOT Vmax:         113.00 cm/s LVOT Vmean:        90.300 cm/s LVOT VTI:          0.206 m LVOT/AV VTI ratio: 0.82  AORTA Ao Root diam: 2.60 cm MITRAL VALVE MV Area (PHT): 3.60 cm     SHUNTS MV Decel Time: 211  msec     Systemic VTI:  0.21 m MV E velocity: 144.00 cm/s  Systemic Diam: 1.90 cm Soyla Merck MD Electronically signed by Soyla Merck MD Signature Date/Time: 03/27/2024/3:02:05 PM    Final    NM Hepatobiliary Liver Func Result Date: 03/26/2024 CLINICAL DATA:  Abdominal pain, upper, chronic, assess gallbladder motility upper abdominal pain, previous imaging equivocal for cholecystitis EXAM: NUCLEAR MEDICINE HEPATOBILIARY IMAGING TECHNIQUE: Sequential images of the abdomen were obtained out to 60 minutes following intravenous administration of radiopharmaceutical. RADIOPHARMACEUTICALS:  5.0 mCi Tc-61m Choletec  IV. 1.0 mCi booster after 3 milligrams of intravenous morphine  sulfate. COMPARISON:  Right upper quadrant ultrasound and abdominal CT 03/25/2024. FINDINGS: Initial images demonstrate prompt uptake by the liver with excretion into the biliary system and small bowel. Through 60 minutes of imaging, no definite gallbladder opacification identified. 3 milligrams of intravenous morphine  sulfate was administered with additional imaging for 30 minutes. After  morphine , there is opacification of the gallbladder lumen. No evidence of bile leak. IMPRESSION: The cystic and common bile ducts are patent. No evidence of cholecystitis or bile leak. Electronically Signed   By: Elsie Perone M.D.   On: 03/26/2024 18:15   US  Abdomen Limited RUQ (LIVER/GB) Result Date: 03/25/2024 EXAM: Right Upper Quadrant Abdominal Ultrasound 03/25/2024 10:25:00 PM TECHNIQUE: Real-time ultrasonography of the right upper quadrant of the abdomen was performed. COMPARISON: None available. CLINICAL HISTORY: Vomiting; Abnormal CT scan. ICD-10 codes: R11.0, R93.8. FINDINGS: LIVER: The liver demonstrates normal echogenicity. No intrahepatic biliary ductal dilatation. No evidence of mass. BILIARY SYSTEM: There is pericholecystic fluid and wall thickening of the gallbladder. No cholelithiasis. Negative sonographic Murphy's sign. Common bile duct is mildly dilated measuring 7 mm. RIGHT KIDNEY: The right kidney is grossly unremarkable in appearances without evidence of hydronephrosis, echogenic calculi or worrisome mass lesions. OTHER: No right upper quadrant ascites. IMPRESSION: 1. Gallbladder wall thickening and pericholecystic fluid without cholelithiasis or sonographic Murphy's sign. Findings are suggestive but not definitive for acute cholecystitis 2. Mildly dilated common bile duct measuring 7 mm. Electronically signed by: Norman Gatlin MD 03/25/2024 11:13 PM EDT RP Workstation: [id number]   CT CHEST ABDOMEN PELVIS W CONTRAST Result Date: 03/25/2024 CLINICAL DATA:  Sepsis concern for sepsis, abnl CXR, vomiting and abdominal pain POV from dialysis, per girlfriend pt appeared to seem to be confused during dialysis so they advised he come to ED. EXAM: CT CHEST, ABDOMEN, AND PELVIS WITH CONTRAST TECHNIQUE: Multidetector CT imaging of the chest, abdomen and pelvis was performed following the standard protocol during bolus administration of intravenous contrast. RADIATION DOSE REDUCTION: This exam was  performed according to the departmental dose-optimization program which includes automated exposure control, adjustment of the mA and/or kV according to patient size and/or use of iterative reconstruction technique. CONTRAST:  80mL OMNIPAQUE  IOHEXOL  300 MG/ML  SOLN COMPARISON:  CT abdomen pelvis 10/21/2022, CT renal 03/16/2015 FINDINGS: CT CHEST FINDINGS Cardiovascular: Normal heart size. No significant pericardial effusion. The thoracic aorta is normal in caliber. No atherosclerotic plaque of the thoracic aorta. No coronary artery calcifications. Mediastinum/Nodes: No enlarged mediastinal, hilar, or axillary lymph nodes. Thyroid  gland, trachea, and esophagus demonstrate no significant findings. Lungs/Pleura: Right lower lobe passive atelectasis. Right upper lobe peribronchovascular patchy ground-glass and centrally consolidative airspace opacities. T0 a smaller extent similar finding at the left anterior apex, bilateral lower lobes and right middle lobe. No pulmonary mass. Trace to small volume right pleural effusion. No pneumothorax. Musculoskeletal: Bilateral gynecomastia, right slightly greater than left. No suspicious lytic or blastic osseous lesions. No acute  displaced fracture. CT ABDOMEN PELVIS FINDINGS Hepatobiliary: No focal liver abnormality. No gallstones, gallbladder wall thickening. Trace pericholecystic fluid. No biliary dilatation. Pancreas: No focal lesion. Normal pancreatic contour. No surrounding inflammatory changes. No main pancreatic ductal dilatation. Spleen: Normal in size without focal abnormality. Adrenals/Urinary Tract: No adrenal nodule bilaterally. Bilateral kidneys enhance symmetrically. No hydronephrosis. No hydroureter. The urinary bladder is unremarkable. Stomach/Bowel: Stomach is within normal limits. No evidence of bowel wall thickening or dilatation. No pneumatosis appendix appears normal. 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 Abdominal aorta or iliac aneurysm. No abdominal, pelvic, or inguinal lymphadenopathy. Reproductive: Prostate is unremarkable. Other: No intraperitoneal free fluid. No intraperitoneal free gas. No organized fluid collection. Musculoskeletal: No abdominal wall hernia or abnormality. No suspicious lytic or blastic osseous lesions. No acute displaced fracture. IMPRESSION: 1. Right upper lobe peribronchovascular patchy ground-glass and centrally consolidative airspace opacities. To a smaller extent similar findings at the left anterior apex, bilateral lower lobes and right middle lobe. Finding could represent infection/inflammation with alveolar hemorrhage not excluded. 2.  Trace to small volume right pleural effusion. 3. Nonspecific pericholecystic free fluid. Correlate with liver function tests and consider right upper quadrant ultrasound if clinically indicated. 4. 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 5. Bilateral gynecomastia, right slightly greater than left. Recommend correlation with physical exam. Electronically Signed   By: Morgane  Naveau M.D.   On: 03/25/2024 20:57   DG Chest 2 View Result Date: 03/25/2024 CLINICAL DATA:  Altered level of consciousness, end-stage renal disease. Fatigue. Vomiting. EXAM: CHEST - 2 VIEW COMPARISON:  10/25/2022 FINDINGS: Chronic cardiomegaly. Vascular congestion. Additional patchy opacity in the right suprahilar lung. Small left pleural effusion. No pneumothorax. No acute osseous findings. IMPRESSION: 1. Chronic cardiomegaly with vascular congestion. 2. Patchy opacity in the right suprahilar lung, may be atelectasis or pneumonia. 3. Small left pleural effusion. Electronically Signed   By: Andrea Gasman M.D.   On: 03/25/2024 18:09   CT Head Wo Contrast Result Date: 03/25/2024 CLINICAL  DATA:  Mental status change, unknown cause ALOC, ESRD EXAM: CT HEAD WITHOUT CONTRAST TECHNIQUE: Contiguous axial images were obtained from the base of the skull through the vertex without intravenous contrast. RADIATION DOSE REDUCTION: This exam was performed according to the departmental dose-optimization program which includes automated exposure control, adjustment of the mA and/or kV according to patient size and/or use of iterative reconstruction technique. COMPARISON:  Remote CT 06/16/2009 FINDINGS: Brain: Segmental imaging due to motion. No intracranial hemorrhage, mass effect, or midline shift. No hydrocephalus. The basilar cisterns are patent. No evidence of territorial infarct or acute ischemia. No extra-axial or intracranial fluid collection. Vascular: Atherosclerosis of skullbase vasculature without hyperdense vessel or abnormal calcification. Skull: No fracture or focal lesion. Sinuses/Orbits: Diffuse hyperdensity in the left lobe. Right cataract resection. Other: None. IMPRESSION: 1. No acute intracranial abnormality. 2. Diffuse hyperdensity in the left globe, may be related to prior surgery or trauma. Recommend correlation with clinical history. Electronically Signed   By: Andrea Gasman M.D.   On: 03/25/2024 18:07    Microbiology: Results for orders placed or performed during the hospital encounter of 03/25/24  Blood culture (routine x 2)     Status: None   Collection Time: 03/25/24  6:17 PM   Specimen: Right Antecubital; Blood  Result Value Ref  Range Status   Specimen Description   Final    RIGHT ANTECUBITAL Performed at [facility], [address] [place], [place] [address]    Special Requests   Final    BOTTLES DRAWN AEROBIC AND ANAEROBIC Blood Culture adequate volume Performed at [facility], [address] [place], [place] [address]    Culture   Final    NO GROWTH 5 DAYS Performed at [facility], [address]  [place], [place] [address]    Report Status 03/31/2024 FINAL  Final  Resp panel by RT-PCR (RSV, Flu A&B, Covid) Anterior Nasal Swab     Status: None   Collection Time: 03/25/24  8:14 PM   Specimen: Anterior Nasal Swab  Result Value Ref Range Status   SARS Coronavirus 2 by RT PCR NEGATIVE NEGATIVE Final    Comment: (NOTE) SARS-CoV-2 target nucleic acids are NOT DETECTED.  The SARS-CoV-2 RNA is generally detectable in upper respiratory specimens during the acute phase of infection. The lowest concentration of SARS-CoV-2 viral copies this assay can detect is 138 copies/mL. A negative result does not preclude SARS-Cov-2 infection and should not be used as the sole basis for treatment or other patient management decisions. A negative result may occur with  improper specimen collection/handling, submission of specimen other than nasopharyngeal swab, presence of viral mutation(s) within the areas targeted by this assay, and inadequate number of viral copies(<138 copies/mL). A negative result must be combined with clinical observations, patient history, and epidemiological information. The expected result is Negative.  Fact Sheet for Patients:  BloggerCourse.com  Fact Sheet for Healthcare Providers:  SeriousBroker.it  This test is no t yet approved or cleared by the [place]  FDA and  has been authorized for detection and/or diagnosis of SARS-CoV-2 by FDA under an Emergency Use Authorization (EUA). This EUA will remain  in effect (meaning this test can be used) for the duration of the COVID-19 declaration under Section 564(b)(1) of the Act, 21 U.S.C.section 360bbb-3(b)(1), unless the authorization is terminated  or revoked sooner.       Influenza A by PCR NEGATIVE NEGATIVE Final   Influenza B by PCR NEGATIVE NEGATIVE Final    Comment: (NOTE) The Xpert Xpress SARS-CoV-2/FLU/RSV plus assay is intended as an aid in the diagnosis of  influenza from Nasopharyngeal swab specimens and should not be used as a sole basis for treatment. Nasal washings and aspirates are unacceptable for Xpert Xpress SARS-CoV-2/FLU/RSV testing.  Fact Sheet for Patients: BloggerCourse.com  Fact Sheet for Healthcare Providers: SeriousBroker.it  This test is not yet approved or cleared by the [place]  FDA and has been authorized for detection and/or diagnosis of SARS-CoV-2 by FDA under an Emergency Use Authorization (EUA). This EUA will remain in effect (meaning this test can be used) for the duration of the COVID-19 declaration under Section 564(b)(1) of the Act, 21 U.S.C. section 360bbb-3(b)(1), unless the authorization is terminated or revoked.     Resp Syncytial Virus by PCR NEGATIVE NEGATIVE Final    Comment: (NOTE) Fact Sheet for Patients: BloggerCourse.com  Fact Sheet for Healthcare Providers: SeriousBroker.it  This test is not yet approved or cleared by the [place]  FDA and has been authorized for detection and/or diagnosis of SARS-CoV-2 by FDA under an Emergency Use Authorization (EUA). This EUA will remain in effect (meaning this test can be used) for the duration of the COVID-19 declaration under Section 564(b)(1) of the Act, 21 U.S.C. section 360bbb-3(b)(1), unless the authorization is terminated or revoked.  Performed at [facility], [address] [place], [place] [address]   Blood culture (routine x 2)     Status: None   Collection Time: 03/25/24  9:14 PM   Specimen: BLOOD RIGHT HAND  Result Value Ref Range Status   Specimen Description   Final    BLOOD RIGHT HAND Performed at [facility], [address], [place], [place] [address]    Special Requests   Final    BOTTLES DRAWN AEROBIC AND ANAEROBIC Blood Culture results may not be optimal due to an inadequate  volume of blood received in culture bottles Performed at [facility], [address] [place], [place] [address]    Culture   Final    NO GROWTH 5 DAYS Performed at [facility], [address], [place], [place] [address]    Report Status 03/31/2024 FINAL  Final  Respiratory (~20 pathogens) panel by PCR     Status: None   Collection Time: 03/26/24  3:56 AM   Specimen: Nasopharyngeal Swab; Respiratory  Result Value Ref Range Status   Adenovirus NOT DETECTED NOT DETECTED Final   Coronavirus 229E NOT DETECTED NOT DETECTED Final    Comment: (NOTE) The Coronavirus on the Respiratory Panel, DOES NOT test for the novel  Coronavirus (2019 nCoV)    Coronavirus HKU1 NOT DETECTED NOT DETECTED Final   Coronavirus NL63 NOT DETECTED NOT DETECTED Final   Coronavirus OC43 NOT DETECTED NOT DETECTED Final   Metapneumovirus NOT DETECTED NOT DETECTED Final   Rhinovirus / Enterovirus NOT DETECTED NOT DETECTED Final   Influenza A NOT DETECTED NOT DETECTED Final   Influenza B NOT DETECTED NOT DETECTED Final   Parainfluenza Virus 1 NOT DETECTED NOT DETECTED Final   Parainfluenza Virus 2 NOT DETECTED NOT DETECTED Final   Parainfluenza Virus 3 NOT DETECTED NOT DETECTED Final   Parainfluenza Virus 4 NOT DETECTED NOT DETECTED Final   Respiratory Syncytial Virus NOT DETECTED NOT DETECTED Final   Bordetella pertussis NOT DETECTED NOT DETECTED Final   Bordetella Parapertussis NOT DETECTED NOT DETECTED Final   Chlamydophila pneumoniae NOT DETECTED NOT DETECTED Final   Mycoplasma pneumoniae NOT DETECTED NOT DETECTED Final    Comment: Performed at [facility], [address], [place], [place] [address]    Labs: CBC: Recent Labs  Lab 03/26/24 [phone] 03/27/24 0930 03/27/24 2342 03/29/24 0405 03/30/24 0445  WBC 11.8* 10.5 8.9 8.2 9.7  NEUTROABS  --  8.5* 6.9 6.2 7.0  HGB 7.4* 8.6* 7.9* 7.7* 8.2*  HCT 22.6* 26.3* 23.7* 23.9* 24.5*  MCV 96.6 97.4 94.8 98.4 94.6  PLT 320  333 309 278 337   Basic Metabolic Panel: Recent Labs  Lab 03/27/24 0930 03/27/24 1153 03/27/24 2342 03/28/24 0315 03/28/24 1732 03/28/24 2209 03/29/24 0405 03/29/24 1628 03/30/24 0445  NA 134*   < > 135   < > 137 135 138 136 134*  K 4.3   < > 4.0   < > 3.5 3.8 3.6 3.6 3.7  CL 92*   < > 94*   < > 100 97* 99 95* 96*  CO2 19*   < > 21*   < > 23 23 20* 22 21*  GLUCOSE 241*   < > 143*   < > 161* 161* 163* 158* 126*  BUN 92*   < > 97*   < > 43* 47* 48* 55* 59*  CREATININE 9.58*   < > 10.43*   < > 6.52* 7.41* 7.20* 8.87*  9.62*  CALCIUM  8.1*   < > 8.1*   < > 7.5* 8.0* 7.6* 8.5* 8.2*  MG 2.3  --  2.3  --   --   --  1.9  --  2.2  PHOS 7.0*  --  6.7*  --   --   --  4.8*  --  5.7*   < > = values in this interval not displayed.   Liver Function Tests: Recent Labs  Lab 03/27/24 2342 03/28/24 2209 03/29/24 0405 03/29/24 1628 03/30/24 0445  AST 23 20 14* 16 13*  ALT 72* 58* 51* 52* 41  ALKPHOS 83 68 67 74 69  BILITOT 0.9 1.0 1.3* 1.1 0.9  PROT 5.5* 5.4* 5.1* 6.1* 5.5*  ALBUMIN  2.9* 2.8* 2.6* 3.0* 2.8*   CBG: Recent Labs  Lab 03/29/24 1639 03/29/24 2029 03/29/24 2339 03/30/24 0400 03/30/24 0830  GLUCAP 162* 168* 190* 121* 147*   Discharge time spent: less than 30 minutes.  Signed: Alejandro Marker, DO [facility] 04/01/2024

## 2024-09-10 ENCOUNTER — Ambulatory Visit: Attending: Vascular Surgery | Admitting: Vascular Surgery

## 2024-09-10 ENCOUNTER — Encounter: Payer: Self-pay | Admitting: Vascular Surgery

## 2024-09-10 VITALS — BP 174/123 | HR 92 | Temp 98.5°F | Ht 70.0 in | Wt 192.0 lb

## 2024-09-10 DIAGNOSIS — N186 End stage renal disease: Secondary | ICD-10-CM | POA: Diagnosis not present

## 2024-09-10 NOTE — Progress Notes (Signed)
 "  Patient ID: John [name], male   DOB: 06-07-1991, 34 y.o.   MRN: [id number]  Reason for Consult: No chief complaint on file.   Referred by Beryl Donnice BRAVO, MD  Subjective:     HPI:  John [name] is a 34 y.o. male has a history of end-stage renal disease currently dialyzing via left upper arm cephalic vein fistula.  This has previously undergone cephalic arch balloon angioplasty.  Recently he developed a scab and was sent here for further evaluation.  In the interim the scab is removed he has not had any bleeding issues and states that clears during dialysis his strong and he is not having any issues with increased bleeding at completion.  Past Medical History:  Diagnosis Date   ADD (attention deficit disorder)    Allergic rhinitis    Anemia    hx ckd   Asthma    patient denies this dx, no inhalers   Chronic kidney disease    ESRD on dialysis TTHS   Complication of anesthesia    N/V   Diabetic ketoacidosis without coma associated with type 1 diabetes mellitus (HCC)    DKA (diabetic ketoacidoses)    Family history of adverse reaction to anesthesia    mother - nausea   Goiter, unspecified 02/04/2011   Headache    with elevate blood pressure   Hypertension    IDA (iron deficiency anemia)    Pneumonia    Possiblly 10/25/22-  he was told, from chest X-ray   PONV (postoperative nausea and vomiting)    Type I (juvenile type) diabetes mellitus without mention of complication, uncontrolled 02/04/2011   On Insulin    Family History  Problem Relation Age of Onset   Lupus Cousin    Cancer Neg Hx    Past Surgical History:  Procedure Laterality Date   A/V FISTULAGRAM N/A 10/06/2023   Procedure: A/V Fistulagram;  Surgeon: Melia Lynwood ORN, MD;  Location: [facility];  Service: Cardiovascular;  Laterality: N/A;   AV FISTULA PLACEMENT Left 10/25/2022   Procedure: CEPHALIC ARTERIOVENOUS (AV) FISTULA CREATION;  Surgeon: Sheree Penne Bruckner, MD;  Location: [facility] OR;   Service: Vascular;  Laterality: Left;   CAPD INSERTION N/A 01/17/2023   Procedure: LAPAROSCOPIC INSERTION CONTINUOUS AMBULATORY PERITONEAL DIALYSIS  (CAPD) CATHETER;  Surgeon: Sheree Penne Bruckner, MD;  Location: [facility] OR;  Service: Vascular;  Laterality: N/A;   CAPD REMOVAL N/A 05/12/2023   Procedure: REMOVAL CONTINUOUS AMBULATORY PERITONEAL DIALYSIS  (CAPD) CATHETER;  Surgeon: Sheree Penne Bruckner, MD;  Location: [facility] OR;  Service: Vascular;  Laterality: N/A;   EYE SURGERY Right 2024   INSERTION OF DIALYSIS CATHETER Right 10/25/2022   Procedure: INSERTION OF TUNNELED PALINDROME 14.5 FR X 19 CM DIALYSIS CATHETER;  Surgeon: Sheree Penne Bruckner, MD;  Location: [facility] OR;  Service: Vascular;  Laterality: Right;   IR TUNNELED CENTRAL VENOUS CATH PLC W IMG  03/28/2024   PERIPHERAL VASCULAR BALLOON ANGIOPLASTY Left 10/06/2023   Procedure: PERIPHERAL VASCULAR BALLOON ANGIOPLASTY;  Surgeon: Melia Lynwood ORN, MD;  Location: [facility];  Service: Cardiovascular;  Laterality: Left;  cephalic arch   TONSILLECTOMY      Short Social History:  Social History   Tobacco Use   Smoking status: Former    Current packs/day: 0.00    Average packs/day: 1.0 packs/day    Types: Cigarettes    Quit date: 12/26/2022    Years since quitting: 1.7   Smokeless tobacco: Former    Quit date:  06/26/2013  Substance Use Topics   Alcohol use: Not Currently    Comment: none since 10/2022    Allergies[1]  Current Outpatient Medications  Medication Sig Dispense Refill   acetaminophen  (TYLENOL ) 500 MG tablet Take 1,000 mg by mouth every 6 (six) hours as needed for mild pain or moderate pain.     amLODipine  (NORVASC ) 10 MG tablet Take 1 tablet (10 mg total) by mouth daily. 30 tablet 1   atropine  1 % ophthalmic solution Place 1 drop into the left eye daily.     bismuth subsalicylate (PEPTO BISMOL) 262 MG chewable tablet Chew 524 mg by mouth daily as needed for indigestion or diarrhea or loose stools.     Calcium   Carbonate Antacid (TUMS PO) Take 1 tablet by mouth 2 (two) times daily as needed (heartburn).     cetirizine  (ZYRTEC ) 10 MG tablet Take 10 mg by mouth daily.     diphenhydrAMINE  (BENADRYL ) 25 MG tablet Take 25 mg by mouth at bedtime.     Glucagon (BAQSIMI ONE PACK) 3 MG/DOSE POWD Place 1 spray into the nose as needed (hypoglycemic episode).     Insulin  Glargine (BASAGLAR  KWIKPEN) 100 UNIT/ML Inject 18 Units into the skin at bedtime.     labetalol  (NORMODYNE ) 100 MG tablet Take 1 tablet (100 mg total) by mouth 2 (two) times daily. (Patient taking differently: Take 100-200 mg by mouth See admin instructions. On dialysis days, take 200mg  (2 tablets) twice a day, and on non dialysis days take 200mg  (2 tablets) in the morning and 100mg  (1 tablet) at night.) 60 tablet 1   Melatonin 10 MG CHEW Chew 10-20 mg by mouth at bedtime as needed (Sleep).     Multiple Vitamins-Minerals (PRESERVISION AREDS 2 PO) Take 1 tablet by mouth in the morning.     Multiple Vitamins-Minerals (ZINC PO) Take 1 tablet by mouth daily.     NOVOLOG  FLEXPEN 100 UNIT/ML FlexPen Inject 3-6 Units into the skin 3 (three) times daily with meals.     sevelamer carbonate (RENVELA) 800 MG tablet Take 2,400 mg by mouth See admin instructions. Take 3 tablets (2400 mg) by mouth with each meal & with each snack     simethicone  (MYLICON) 80 MG chewable tablet Chew 80 mg by mouth every 6 (six) hours as needed for flatulence.     No current facility-administered medications for this visit.    Review of Systems  Constitutional:  Constitutional negative. HENT: HENT negative.  Eyes: Eyes negative. Positive for loss of vision.   Respiratory: Respiratory negative.  Cardiovascular: Cardiovascular negative.  GI: Gastrointestinal negative.  Musculoskeletal: Musculoskeletal negative.  Skin: Skin negative.  Neurological: Neurological negative. Hematologic: Hematologic/lymphatic negative.  Psychiatric: Psychiatric negative.        Objective:   Objective    Vitals:   09/10/24 1350  BP: (!) 174/123  Pulse: 92  Temp: 98.5 F (36.9 C)  SpO2: 95%     Physical Exam HENT:     Head: Normocephalic.     Nose: Nose normal.  Eyes:     Pupils: Pupils are equal, round, and reactive to light.  Cardiovascular:     Pulses:          Radial pulses are 2+ on the right side and 2+ on the left side.  Musculoskeletal:     Comments: Pulsatility and very large serpentine left upper arm AV fistula  Skin:    Comments: Skin can be pinched overlying the fistula throughout the left upper arm  Neurological:  Mental Status: He is alert.     Data: No new studies     Assessment/Plan:    34 year old male history end-stage renal disease dialyzing via left upper arm AV fistula.  The fistula has pulsatility but continues to work well without complications.  Skin can be pinched throughout the upper arm fistula and does not need revision.  Potentially would need fistulogram with possible cephalic arch intervention in the future but at this time can follow-up on an as-needed basis.    Penne Lonni Colorado MD [facility]      [1]  Allergies Allergen Reactions   Wound Dressing Adhesive Other (See Comments)    Blisters    "
